# Patient Record
Sex: Female | Born: 1955 | Race: Black or African American | Hispanic: No | Marital: Married | Smoking: Former smoker
Health system: Southern US, Community
[De-identification: ages and names within clinical notes are randomized; demographics above are authoritative.]

## PROBLEM LIST (undated history)

## (undated) DIAGNOSIS — R0902 Hypoxemia: Secondary | ICD-10-CM

## (undated) DIAGNOSIS — T8859XA Other complications of anesthesia, initial encounter: Secondary | ICD-10-CM

## (undated) DIAGNOSIS — R0600 Dyspnea, unspecified: Secondary | ICD-10-CM

## (undated) DIAGNOSIS — K219 Gastro-esophageal reflux disease without esophagitis: Secondary | ICD-10-CM

## (undated) DIAGNOSIS — G4700 Insomnia, unspecified: Secondary | ICD-10-CM

## (undated) DIAGNOSIS — R519 Headache, unspecified: Secondary | ICD-10-CM

## (undated) DIAGNOSIS — R51 Headache: Secondary | ICD-10-CM

## (undated) DIAGNOSIS — E785 Hyperlipidemia, unspecified: Secondary | ICD-10-CM

## (undated) DIAGNOSIS — M1990 Unspecified osteoarthritis, unspecified site: Secondary | ICD-10-CM

## (undated) DIAGNOSIS — M549 Dorsalgia, unspecified: Secondary | ICD-10-CM

## (undated) DIAGNOSIS — G8929 Other chronic pain: Secondary | ICD-10-CM

## (undated) DIAGNOSIS — T4145XA Adverse effect of unspecified anesthetic, initial encounter: Secondary | ICD-10-CM

## (undated) DIAGNOSIS — R000 Tachycardia, unspecified: Secondary | ICD-10-CM

## (undated) DIAGNOSIS — Z972 Presence of dental prosthetic device (complete) (partial): Secondary | ICD-10-CM

## (undated) DIAGNOSIS — D649 Anemia, unspecified: Secondary | ICD-10-CM

## (undated) DIAGNOSIS — T7840XA Allergy, unspecified, initial encounter: Secondary | ICD-10-CM

## (undated) DIAGNOSIS — M810 Age-related osteoporosis without current pathological fracture: Secondary | ICD-10-CM

## (undated) DIAGNOSIS — J449 Chronic obstructive pulmonary disease, unspecified: Secondary | ICD-10-CM

## (undated) DIAGNOSIS — I10 Essential (primary) hypertension: Secondary | ICD-10-CM

## (undated) DIAGNOSIS — R42 Dizziness and giddiness: Secondary | ICD-10-CM

## (undated) HISTORY — DX: Allergy, unspecified, initial encounter: T78.40XA

## (undated) HISTORY — PX: CYSTECTOMY: SUR359

## (undated) HISTORY — PX: CYST REMOVAL TRUNK: SHX6283

## (undated) HISTORY — DX: Hypoxemia: R09.02

## (undated) HISTORY — DX: Headache, unspecified: R51.9

## (undated) HISTORY — DX: Headache: R51

## (undated) HISTORY — DX: Hyperlipidemia, unspecified: E78.5

## (undated) HISTORY — DX: Insomnia, unspecified: G47.00

## (undated) HISTORY — PX: TUBAL LIGATION: SHX77

## (undated) HISTORY — PX: ABDOMINAL HYSTERECTOMY: SHX81

## (undated) HISTORY — PX: OTHER SURGICAL HISTORY: SHX169

---

## 2004-09-30 ENCOUNTER — Emergency Department: Payer: Self-pay | Admitting: Emergency Medicine

## 2005-02-21 ENCOUNTER — Emergency Department: Payer: Self-pay | Admitting: Emergency Medicine

## 2005-06-23 ENCOUNTER — Emergency Department: Payer: Self-pay | Admitting: Emergency Medicine

## 2005-08-14 ENCOUNTER — Emergency Department: Payer: Self-pay | Admitting: Emergency Medicine

## 2007-04-25 ENCOUNTER — Emergency Department: Payer: Self-pay | Admitting: Emergency Medicine

## 2007-04-25 ENCOUNTER — Other Ambulatory Visit: Payer: Self-pay

## 2007-10-06 ENCOUNTER — Inpatient Hospital Stay: Payer: Self-pay | Admitting: *Deleted

## 2007-10-06 ENCOUNTER — Other Ambulatory Visit: Payer: Self-pay

## 2007-12-26 ENCOUNTER — Emergency Department: Payer: Self-pay | Admitting: Emergency Medicine

## 2008-03-03 ENCOUNTER — Inpatient Hospital Stay: Payer: Self-pay | Admitting: Internal Medicine

## 2008-03-29 ENCOUNTER — Emergency Department: Payer: Self-pay | Admitting: Emergency Medicine

## 2008-04-19 ENCOUNTER — Emergency Department: Payer: Self-pay | Admitting: Emergency Medicine

## 2008-07-08 ENCOUNTER — Ambulatory Visit: Payer: Self-pay | Admitting: Family Medicine

## 2008-09-04 ENCOUNTER — Emergency Department: Payer: Self-pay

## 2008-11-19 ENCOUNTER — Ambulatory Visit: Payer: Self-pay | Admitting: Internal Medicine

## 2008-11-22 ENCOUNTER — Ambulatory Visit: Payer: Self-pay | Admitting: Gastroenterology

## 2008-11-26 ENCOUNTER — Ambulatory Visit: Payer: Self-pay | Admitting: Internal Medicine

## 2008-12-20 ENCOUNTER — Ambulatory Visit: Payer: Self-pay | Admitting: Internal Medicine

## 2009-01-09 ENCOUNTER — Emergency Department: Payer: Self-pay

## 2009-01-19 ENCOUNTER — Ambulatory Visit: Payer: Self-pay | Admitting: Gastroenterology

## 2009-03-22 ENCOUNTER — Ambulatory Visit: Payer: Self-pay | Admitting: Internal Medicine

## 2009-04-06 ENCOUNTER — Ambulatory Visit: Payer: Self-pay | Admitting: Internal Medicine

## 2009-04-13 ENCOUNTER — Inpatient Hospital Stay: Payer: Self-pay | Admitting: Internal Medicine

## 2009-04-18 ENCOUNTER — Emergency Department: Payer: Self-pay | Admitting: Emergency Medicine

## 2009-04-19 ENCOUNTER — Ambulatory Visit: Payer: Self-pay | Admitting: Internal Medicine

## 2009-05-31 ENCOUNTER — Emergency Department: Payer: Self-pay | Admitting: Emergency Medicine

## 2009-07-27 ENCOUNTER — Ambulatory Visit: Payer: Self-pay | Admitting: Family Medicine

## 2009-08-04 ENCOUNTER — Encounter: Payer: Self-pay | Admitting: Family Medicine

## 2009-08-19 ENCOUNTER — Encounter: Payer: Self-pay | Admitting: Family Medicine

## 2009-08-29 ENCOUNTER — Ambulatory Visit: Payer: Self-pay | Admitting: Sports Medicine

## 2009-09-19 ENCOUNTER — Encounter: Payer: Self-pay | Admitting: Family Medicine

## 2010-04-19 ENCOUNTER — Inpatient Hospital Stay: Payer: Self-pay | Admitting: Internal Medicine

## 2010-05-11 ENCOUNTER — Inpatient Hospital Stay: Payer: Self-pay | Admitting: Internal Medicine

## 2010-10-06 ENCOUNTER — Ambulatory Visit: Payer: Self-pay | Admitting: Gastroenterology

## 2011-03-16 ENCOUNTER — Inpatient Hospital Stay: Payer: Self-pay | Admitting: Specialist

## 2011-03-16 LAB — COMPREHENSIVE METABOLIC PANEL
Albumin: 3.8 g/dL (ref 3.4–5.0)
Alkaline Phosphatase: 73 U/L (ref 50–136)
BUN: 15 mg/dL (ref 7–18)
Bilirubin,Total: 0.4 mg/dL (ref 0.2–1.0)
Co2: 27 mmol/L (ref 21–32)
Creatinine: 0.85 mg/dL (ref 0.60–1.30)
Glucose: 122 mg/dL — ABNORMAL HIGH (ref 65–99)
Osmolality: 280 (ref 275–301)
Sodium: 139 mmol/L (ref 136–145)

## 2011-03-16 LAB — CBC WITH DIFFERENTIAL/PLATELET
Basophil #: 0 10*3/uL (ref 0.0–0.1)
Basophil %: 0 %
Eosinophil %: 0.2 %
Lymphocyte #: 0.8 10*3/uL — ABNORMAL LOW (ref 1.0–3.6)
MCH: 28 pg (ref 26.0–34.0)
MCHC: 33 g/dL (ref 32.0–36.0)
Monocyte #: 0.5 10*3/uL (ref 0.0–0.7)
Platelet: 312 10*3/uL (ref 150–440)
RDW: 16.3 % — ABNORMAL HIGH (ref 11.5–14.5)

## 2011-03-16 LAB — CK TOTAL AND CKMB (NOT AT ARMC)
CK, Total: 67 U/L (ref 21–215)
CK, Total: 66 U/L (ref 21–215)
CK-MB: <0.5 ng/mL — ABNORMAL LOW (ref 0.5–3.6)

## 2011-03-16 LAB — TROPONIN I: Troponin-I: <0.02 ng/mL

## 2011-03-21 LAB — CULTURE, BLOOD (SINGLE)

## 2011-04-07 ENCOUNTER — Inpatient Hospital Stay: Payer: Self-pay | Admitting: Student

## 2011-04-07 LAB — CBC
HCT: 38 % (ref 35.0–47.0)
HGB: 12.3 g/dL (ref 12.0–16.0)
MCV: 86 fL (ref 80–100)
RBC: 4.43 10*6/uL (ref 3.80–5.20)
RDW: 16.5 % — ABNORMAL HIGH (ref 11.5–14.5)
WBC: 12.5 10*3/uL — ABNORMAL HIGH (ref 3.6–11.0)

## 2011-04-07 LAB — BASIC METABOLIC PANEL
EGFR (African American): >60
EGFR (Non-African Amer.): >60
Glucose: 99 mg/dL (ref 65–99)
Osmolality: 285 (ref 275–301)
Potassium: 3.8 mmol/L (ref 3.5–5.1)
Sodium: 142 mmol/L (ref 136–145)

## 2011-04-07 LAB — PRO B NATRIURETIC PEPTIDE: B-Type Natriuretic Peptide: 72 pg/mL (ref 0–125)

## 2011-04-08 LAB — CBC WITH DIFFERENTIAL/PLATELET
Basophil %: 0.1 %
Eosinophil #: 0 10*3/uL (ref 0.0–0.7)
Eosinophil %: 0 %
HCT: 38.3 % (ref 35.0–47.0)
HGB: 12.4 g/dL (ref 12.0–16.0)
Lymphocyte #: 0.7 10*3/uL — ABNORMAL LOW (ref 1.0–3.6)
MCH: 27.6 pg (ref 26.0–34.0)
MCV: 86 fL (ref 80–100)
Monocyte #: 0.4 10*3/uL (ref 0.0–0.7)
Neutrophil #: 19.5 10*3/uL — ABNORMAL HIGH (ref 1.4–6.5)
RDW: 16.8 % — ABNORMAL HIGH (ref 11.5–14.5)
WBC: 20.6 10*3/uL — ABNORMAL HIGH (ref 3.6–11.0)

## 2011-04-08 LAB — BASIC METABOLIC PANEL
Calcium, Total: 9.5 mg/dL (ref 8.5–10.1)
Co2: 23 mmol/L (ref 21–32)
Creatinine: 1.05 mg/dL (ref 0.60–1.30)
EGFR (Non-African Amer.): 58 — ABNORMAL LOW
Glucose: 150 mg/dL — ABNORMAL HIGH (ref 65–99)

## 2011-04-09 LAB — CBC WITH DIFFERENTIAL/PLATELET
Basophil #: 0.3 10*3/uL — ABNORMAL HIGH (ref 0.0–0.1)
Eosinophil #: 0 10*3/uL (ref 0.0–0.7)
Eosinophil %: 0 %
HCT: 36.2 % (ref 35.0–47.0)
Lymphocyte #: 1 10*3/uL (ref 1.0–3.6)
Lymphocyte %: 4.7 %
Lymphocytes: 2 %
MCHC: 32.7 g/dL (ref 32.0–36.0)
MCV: 86 fL (ref 80–100)
Monocyte #: 0.7 10*3/uL (ref 0.0–0.7)
Monocyte %: 3.1 %
Monocytes: 5 %
Neutrophil %: 90.8 %
Platelet: 338 10*3/uL (ref 150–440)
RDW: 16.3 % — ABNORMAL HIGH (ref 11.5–14.5)
Segmented Neutrophils: 93 %
WBC: 20.9 10*3/uL — ABNORMAL HIGH (ref 3.6–11.0)

## 2011-04-10 LAB — CBC WITH DIFFERENTIAL/PLATELET
Basophil #: 0 10*3/uL (ref 0.0–0.1)
Eosinophil %: 0 %
HCT: 35.5 % (ref 35.0–47.0)
Lymphocyte #: 0.9 10*3/uL — ABNORMAL LOW (ref 1.0–3.6)
MCH: 28 pg (ref 26.0–34.0)
Monocyte #: 0.7 10*3/uL (ref 0.0–0.7)
Neutrophil %: 88.7 %
Platelet: 327 10*3/uL (ref 150–440)
RBC: 4.1 10*6/uL (ref 3.80–5.20)
RDW: 16.8 % — ABNORMAL HIGH (ref 11.5–14.5)
WBC: 14.1 10*3/uL — ABNORMAL HIGH (ref 3.6–11.0)

## 2011-04-12 LAB — EXPECTORATED SPUTUM ASSESSMENT W GRAM STAIN, RFLX TO RESP C

## 2011-05-29 ENCOUNTER — Ambulatory Visit: Payer: Self-pay | Admitting: Family Medicine

## 2011-07-27 ENCOUNTER — Emergency Department: Payer: Self-pay | Admitting: Unknown Physician Specialty

## 2011-08-15 ENCOUNTER — Inpatient Hospital Stay: Payer: Self-pay | Admitting: Internal Medicine

## 2011-08-15 LAB — CBC
HCT: 39.2 % (ref 35.0–47.0)
MCH: 24.9 pg — ABNORMAL LOW (ref 26.0–34.0)
MCV: 81 fL (ref 80–100)
Platelet: 329 10*3/uL (ref 150–440)
RBC: 4.84 10*6/uL (ref 3.80–5.20)
RDW: 15.7 % — ABNORMAL HIGH (ref 11.5–14.5)
WBC: 8.6 10*3/uL (ref 3.6–11.0)

## 2011-08-15 LAB — URINALYSIS, COMPLETE
RBC,UR: <1 /HPF (ref 0–5)
Squamous Epithelial: 8
WBC UR: 1 /HPF (ref 0–5)

## 2011-08-15 LAB — BASIC METABOLIC PANEL
Anion Gap: 7 (ref 7–16)
BUN: 14 mg/dL (ref 7–18)
Chloride: 102 mmol/L (ref 98–107)
Co2: 27 mmol/L (ref 21–32)
EGFR (African American): >60
EGFR (Non-African Amer.): >60
Glucose: 134 mg/dL — ABNORMAL HIGH (ref 65–99)
Osmolality: 274 (ref 275–301)

## 2011-08-15 LAB — TROPONIN I: Troponin-I: <0.02 ng/mL

## 2011-08-15 LAB — CK TOTAL AND CKMB (NOT AT ARMC)
CK, Total: 78 U/L (ref 21–215)
CK-MB: <0.5 ng/mL — ABNORMAL LOW (ref 0.5–3.6)

## 2011-08-16 LAB — CBC WITH DIFFERENTIAL/PLATELET
Basophil #: 0 10*3/uL (ref 0.0–0.1)
Eosinophil #: 0 10*3/uL (ref 0.0–0.7)
HCT: 37.1 % (ref 35.0–47.0)
HGB: 11.6 g/dL — ABNORMAL LOW (ref 12.0–16.0)
Lymphocyte #: 0.6 10*3/uL — ABNORMAL LOW (ref 1.0–3.6)
MCH: 25 pg — ABNORMAL LOW (ref 26.0–34.0)
MCHC: 31.2 g/dL — ABNORMAL LOW (ref 32.0–36.0)
Monocyte #: 0.6 x10 3/mm (ref 0.2–0.9)
Neutrophil %: 94.2 %
Platelet: 385 10*3/uL (ref 150–440)
RBC: 4.62 10*6/uL (ref 3.80–5.20)
RDW: 16 % — ABNORMAL HIGH (ref 11.5–14.5)

## 2011-08-16 LAB — BASIC METABOLIC PANEL
Anion Gap: 7 (ref 7–16)
BUN: 22 mg/dL — ABNORMAL HIGH (ref 7–18)
Calcium, Total: 9.6 mg/dL (ref 8.5–10.1)
Co2: 28 mmol/L (ref 21–32)
Creatinine: 1 mg/dL (ref 0.60–1.30)
EGFR (Non-African Amer.): >60
Sodium: 136 mmol/L (ref 136–145)

## 2011-08-20 LAB — CBC WITH DIFFERENTIAL/PLATELET
Basophil #: 0 10*3/uL (ref 0.0–0.1)
Basophil %: 0.1 %
Eosinophil %: 0.1 %
HCT: 37.1 % (ref 35.0–47.0)
HGB: 11.3 g/dL — ABNORMAL LOW (ref 12.0–16.0)
Lymphocyte #: 1.3 10*3/uL (ref 1.0–3.6)
Lymphocyte %: 8.3 %
MCV: 81 fL (ref 80–100)
Monocyte %: 6.3 %
Neutrophil #: 13 10*3/uL — ABNORMAL HIGH (ref 1.4–6.5)
Platelet: 381 10*3/uL (ref 150–440)
RBC: 4.61 10*6/uL (ref 3.80–5.20)
RDW: 15.9 % — ABNORMAL HIGH (ref 11.5–14.5)
WBC: 15.2 10*3/uL — ABNORMAL HIGH (ref 3.6–11.0)

## 2011-08-20 LAB — CULTURE, BLOOD (SINGLE)

## 2011-08-20 LAB — BASIC METABOLIC PANEL
BUN: 26 mg/dL — ABNORMAL HIGH (ref 7–18)
Calcium, Total: 8.7 mg/dL (ref 8.5–10.1)
Creatinine: 0.92 mg/dL (ref 0.60–1.30)
EGFR (African American): >60
EGFR (Non-African Amer.): >60
Glucose: 167 mg/dL — ABNORMAL HIGH (ref 65–99)
Osmolality: 284 (ref 275–301)
Potassium: 4.1 mmol/L (ref 3.5–5.1)

## 2011-09-20 ENCOUNTER — Ambulatory Visit: Payer: Self-pay | Admitting: Unknown Physician Specialty

## 2011-10-03 ENCOUNTER — Encounter: Payer: Self-pay | Admitting: Unknown Physician Specialty

## 2011-10-21 ENCOUNTER — Encounter: Payer: Self-pay | Admitting: Unknown Physician Specialty

## 2011-11-13 ENCOUNTER — Emergency Department: Payer: Self-pay | Admitting: Emergency Medicine

## 2012-02-15 ENCOUNTER — Emergency Department: Payer: Self-pay | Admitting: Emergency Medicine

## 2012-02-15 LAB — CBC
HGB: 12.8 g/dL (ref 12.0–16.0)
MCH: 28 pg (ref 26.0–34.0)
Platelet: 296 10*3/uL (ref 150–440)
RDW: 19.5 % — ABNORMAL HIGH (ref 11.5–14.5)
WBC: 6.1 10*3/uL (ref 3.6–11.0)

## 2012-02-15 LAB — BASIC METABOLIC PANEL
Anion Gap: 8 (ref 7–16)
BUN: 15 mg/dL (ref 7–18)
Calcium, Total: 8.6 mg/dL (ref 8.5–10.1)
Chloride: 102 mmol/L (ref 98–107)
Co2: 29 mmol/L (ref 21–32)
EGFR (African American): >60
Sodium: 139 mmol/L (ref 136–145)

## 2012-02-15 LAB — TROPONIN I: Troponin-I: <0.02 ng/mL

## 2012-02-15 LAB — CK TOTAL AND CKMB (NOT AT ARMC)
CK, Total: 203 U/L (ref 21–215)
CK-MB: 1.1 ng/mL (ref 0.5–3.6)

## 2012-06-27 ENCOUNTER — Ambulatory Visit: Payer: Self-pay | Admitting: Family Medicine

## 2012-07-01 ENCOUNTER — Ambulatory Visit: Payer: Self-pay | Admitting: Family Medicine

## 2012-10-03 ENCOUNTER — Emergency Department: Payer: Self-pay | Admitting: Emergency Medicine

## 2012-10-03 LAB — URINALYSIS, COMPLETE
Bacteria: NONE SEEN
Bilirubin,UR: NEGATIVE
Blood: NEGATIVE
Ketone: NEGATIVE
Nitrite: NEGATIVE
Ph: 6 (ref 4.5–8.0)
Protein: NEGATIVE
Specific Gravity: 1.013 (ref 1.003–1.030)
Squamous Epithelial: 5
WBC UR: NONE SEEN /HPF (ref 0–5)

## 2012-10-03 LAB — TROPONIN I: Troponin-I: <0.02 ng/mL

## 2012-10-03 LAB — COMPREHENSIVE METABOLIC PANEL
Albumin: 3.9 g/dL (ref 3.4–5.0)
Alkaline Phosphatase: 83 U/L (ref 50–136)
Bilirubin,Total: 0.3 mg/dL (ref 0.2–1.0)
Chloride: 103 mmol/L (ref 98–107)
Creatinine: 0.98 mg/dL (ref 0.60–1.30)
EGFR (African American): >60
EGFR (Non-African Amer.): >60
Sodium: 135 mmol/L — ABNORMAL LOW (ref 136–145)
Total Protein: 8.1 g/dL (ref 6.4–8.2)

## 2012-10-03 LAB — CBC
HCT: 45.3 % (ref 35.0–47.0)
HGB: 15.5 g/dL (ref 12.0–16.0)
MCH: 31.3 pg (ref 26.0–34.0)
MCHC: 34.2 g/dL (ref 32.0–36.0)
Platelet: 309 10*3/uL (ref 150–440)
RDW: 13.7 % (ref 11.5–14.5)
WBC: 5.7 10*3/uL (ref 3.6–11.0)

## 2012-10-03 LAB — SEDIMENTATION RATE: Erythrocyte Sed Rate: 2 mm/hr (ref 0–30)

## 2012-10-05 ENCOUNTER — Emergency Department: Payer: Self-pay | Admitting: Internal Medicine

## 2012-10-05 LAB — URINALYSIS, COMPLETE
Bacteria: NONE SEEN
Bilirubin,UR: NEGATIVE
Blood: NEGATIVE
Leukocyte Esterase: NEGATIVE
Nitrite: NEGATIVE
Ph: 5 (ref 4.5–8.0)
RBC,UR: 1 /HPF (ref 0–5)

## 2012-10-05 LAB — TSH: Thyroid Stimulating Horm: 0.52 u[IU]/mL

## 2012-10-05 LAB — CBC
HGB: 15.1 g/dL (ref 12.0–16.0)
MCV: 91 fL (ref 80–100)
Platelet: 279 10*3/uL (ref 150–440)
RBC: 4.93 10*6/uL (ref 3.80–5.20)
WBC: 6.6 10*3/uL (ref 3.6–11.0)

## 2012-10-05 LAB — COMPREHENSIVE METABOLIC PANEL
Albumin: 3.7 g/dL (ref 3.4–5.0)
Alkaline Phosphatase: 79 U/L (ref 50–136)
Anion Gap: 3 — ABNORMAL LOW (ref 7–16)
BUN: 19 mg/dL — ABNORMAL HIGH (ref 7–18)
Calcium, Total: 9 mg/dL (ref 8.5–10.1)
Chloride: 102 mmol/L (ref 98–107)
Co2: 30 mmol/L (ref 21–32)
EGFR (African American): >60
EGFR (Non-African Amer.): >60
Osmolality: 275 (ref 275–301)
SGPT (ALT): 21 U/L (ref 12–78)
Sodium: 135 mmol/L — ABNORMAL LOW (ref 136–145)

## 2012-10-05 LAB — SEDIMENTATION RATE: Erythrocyte Sed Rate: 6 mm/hr (ref 0–30)

## 2012-11-27 ENCOUNTER — Ambulatory Visit
Admit: 2012-11-27 | Disposition: A | Discharge status: Home / Self Care | Payer: Self-pay | Admitting: Obstetrics and Gynecology

## 2013-01-05 ENCOUNTER — Emergency Department: Payer: Self-pay | Admitting: Emergency Medicine

## 2013-02-05 ENCOUNTER — Inpatient Hospital Stay: Payer: Self-pay | Admitting: Internal Medicine

## 2013-02-05 LAB — CK TOTAL AND CKMB (NOT AT ARMC): CK-MB: 0.8 ng/mL (ref 0.5–3.6)

## 2013-02-05 LAB — COMPREHENSIVE METABOLIC PANEL
Albumin: 3.9 g/dL (ref 3.4–5.0)
Anion Gap: 5 — ABNORMAL LOW (ref 7–16)
BUN: 16 mg/dL (ref 7–18)
Calcium, Total: 9.5 mg/dL (ref 8.5–10.1)
Co2: 29 mmol/L (ref 21–32)
Creatinine: 0.79 mg/dL (ref 0.60–1.30)
Glucose: 119 mg/dL — ABNORMAL HIGH (ref 65–99)
Potassium: 3.2 mmol/L — ABNORMAL LOW (ref 3.5–5.1)
SGOT(AST): 20 U/L (ref 15–37)
SGPT (ALT): 19 U/L (ref 12–78)
Sodium: 135 mmol/L — ABNORMAL LOW (ref 136–145)
Total Protein: 8.2 g/dL (ref 6.4–8.2)

## 2013-02-05 LAB — URINALYSIS, COMPLETE
Bacteria: NONE SEEN
Bilirubin,UR: NEGATIVE
Glucose,UR: NEGATIVE mg/dL (ref 0–75)
Leukocyte Esterase: NEGATIVE
Ph: 5 (ref 4.5–8.0)
Protein: NEGATIVE
RBC,UR: 1 /HPF (ref 0–5)
Squamous Epithelial: <1

## 2013-02-05 LAB — CBC
HCT: 45.3 % (ref 35.0–47.0)
MCHC: 34.2 g/dL (ref 32.0–36.0)
Platelet: 265 10*3/uL (ref 150–440)
RDW: 14.5 % (ref 11.5–14.5)
WBC: 11.1 10*3/uL — ABNORMAL HIGH (ref 3.6–11.0)

## 2013-02-05 LAB — TROPONIN I: Troponin-I: <0.02 ng/mL

## 2013-02-05 LAB — PRO B NATRIURETIC PEPTIDE: B-Type Natriuretic Peptide: 53 pg/mL (ref 0–125)

## 2013-02-05 LAB — MAGNESIUM: Magnesium: 1.8 mg/dL

## 2013-02-06 LAB — CBC WITH DIFFERENTIAL/PLATELET
Basophil #: 0 10*3/uL (ref 0.0–0.1)
Eosinophil #: 0 10*3/uL (ref 0.0–0.7)
Eosinophil %: 0 %
HCT: 38.1 % (ref 35.0–47.0)
HGB: 12.3 g/dL (ref 12.0–16.0)
Lymphocyte #: 0.6 10*3/uL — ABNORMAL LOW (ref 1.0–3.6)
Lymphocyte %: 3.8 %
MCH: 29.6 pg (ref 26.0–34.0)
MCHC: 32.3 g/dL (ref 32.0–36.0)
MCV: 92 fL (ref 80–100)
Monocyte #: 0.8 x10 3/mm (ref 0.2–0.9)
Monocyte %: 4.6 %
Neutrophil #: 15.4 10*3/uL — ABNORMAL HIGH (ref 1.4–6.5)
Neutrophil %: 91.5 %
Platelet: 241 10*3/uL (ref 150–440)
RBC: 4.16 10*6/uL (ref 3.80–5.20)
RDW: 14.4 % (ref 11.5–14.5)
WBC: 16.9 10*3/uL — ABNORMAL HIGH (ref 3.6–11.0)

## 2013-02-06 LAB — BASIC METABOLIC PANEL
Anion Gap: 5 — ABNORMAL LOW (ref 7–16)
BUN: 18 mg/dL (ref 7–18)
Calcium, Total: 8.9 mg/dL (ref 8.5–10.1)
Chloride: 107 mmol/L (ref 98–107)
Co2: 26 mmol/L (ref 21–32)
EGFR (Non-African Amer.): >60
Glucose: 181 mg/dL — ABNORMAL HIGH (ref 65–99)
Osmolality: 282 (ref 275–301)
Potassium: 4.2 mmol/L (ref 3.5–5.1)
Sodium: 138 mmol/L (ref 136–145)

## 2013-02-07 LAB — MAGNESIUM: Magnesium: 1.9 mg/dL

## 2013-03-05 ENCOUNTER — Ambulatory Visit: Payer: Self-pay

## 2013-07-23 ENCOUNTER — Ambulatory Visit: Payer: Self-pay | Admitting: Family Medicine

## 2013-10-12 ENCOUNTER — Inpatient Hospital Stay: Payer: Self-pay | Admitting: Internal Medicine

## 2013-10-12 LAB — BASIC METABOLIC PANEL
Anion Gap: 9 (ref 7–16)
BUN: 21 mg/dL — ABNORMAL HIGH (ref 7–18)
CO2: 30 mmol/L (ref 21–32)
Calcium, Total: 9.6 mg/dL (ref 8.5–10.1)
Chloride: 106 mmol/L (ref 98–107)
Creatinine: 1.03 mg/dL (ref 0.60–1.30)
EGFR (African American): >60
EGFR (Non-African Amer.): >60
Glucose: 105 mg/dL — ABNORMAL HIGH (ref 65–99)
Osmolality: 292 (ref 275–301)
POTASSIUM: 4.3 mmol/L (ref 3.5–5.1)
Sodium: 145 mmol/L (ref 136–145)

## 2013-10-12 LAB — CBC
HCT: 49 % — ABNORMAL HIGH (ref 35.0–47.0)
HGB: 15.5 g/dL (ref 12.0–16.0)
MCH: 29.8 pg (ref 26.0–34.0)
MCHC: 31.6 g/dL — ABNORMAL LOW (ref 32.0–36.0)
MCV: 94 fL (ref 80–100)
PLATELETS: 335 10*3/uL (ref 150–440)
RBC: 5.19 10*6/uL (ref 3.80–5.20)
RDW: 13.8 % (ref 11.5–14.5)
WBC: 12.5 10*3/uL — ABNORMAL HIGH (ref 3.6–11.0)

## 2013-10-12 LAB — TROPONIN I

## 2013-10-13 LAB — BASIC METABOLIC PANEL
Anion Gap: 11 (ref 7–16)
BUN: 21 mg/dL — ABNORMAL HIGH (ref 7–18)
CALCIUM: 9.5 mg/dL (ref 8.5–10.1)
CHLORIDE: 104 mmol/L (ref 98–107)
Co2: 27 mmol/L (ref 21–32)
Creatinine: 1.26 mg/dL (ref 0.60–1.30)
EGFR (Non-African Amer.): 47 — ABNORMAL LOW
GFR CALC AF AMER: 55 — AB
Glucose: 174 mg/dL — ABNORMAL HIGH (ref 65–99)
Osmolality: 290 (ref 275–301)
Potassium: 4.2 mmol/L (ref 3.5–5.1)
SODIUM: 142 mmol/L (ref 136–145)

## 2013-10-13 LAB — CBC WITH DIFFERENTIAL/PLATELET
BASOS PCT: 0.3 %
Basophil #: 0.1 10*3/uL (ref 0.0–0.1)
EOS ABS: 0 10*3/uL (ref 0.0–0.7)
Eosinophil %: 0 %
HCT: 46.4 % (ref 35.0–47.0)
HGB: 14.8 g/dL (ref 12.0–16.0)
LYMPHS ABS: 0.8 10*3/uL — AB (ref 1.0–3.6)
LYMPHS PCT: 3.7 %
MCH: 30.3 pg (ref 26.0–34.0)
MCHC: 31.8 g/dL — AB (ref 32.0–36.0)
MCV: 95 fL (ref 80–100)
MONO ABS: 0.5 x10 3/mm (ref 0.2–0.9)
MONOS PCT: 2.3 %
Neutrophil #: 19.5 10*3/uL — ABNORMAL HIGH (ref 1.4–6.5)
Neutrophil %: 93.7 %
PLATELETS: 314 10*3/uL (ref 150–440)
RBC: 4.87 10*6/uL (ref 3.80–5.20)
RDW: 13.9 % (ref 11.5–14.5)
WBC: 20.9 10*3/uL — ABNORMAL HIGH (ref 3.6–11.0)

## 2013-10-14 LAB — CBC WITH DIFFERENTIAL/PLATELET
BASOS PCT: 0.2 %
Basophil #: 0 10*3/uL (ref 0.0–0.1)
EOS ABS: 0 10*3/uL (ref 0.0–0.7)
EOS PCT: 0 %
HCT: 45.2 % (ref 35.0–47.0)
HGB: 14.2 g/dL (ref 12.0–16.0)
Lymphocyte #: 0.7 10*3/uL — ABNORMAL LOW (ref 1.0–3.6)
Lymphocyte %: 3.2 %
MCH: 29.9 pg (ref 26.0–34.0)
MCHC: 31.5 g/dL — AB (ref 32.0–36.0)
MCV: 95 fL (ref 80–100)
Monocyte #: 0.9 x10 3/mm (ref 0.2–0.9)
Monocyte %: 3.7 %
NEUTROS PCT: 92.9 %
Neutrophil #: 21.7 10*3/uL — ABNORMAL HIGH (ref 1.4–6.5)
Platelet: 314 10*3/uL (ref 150–440)
RBC: 4.76 10*6/uL (ref 3.80–5.20)
RDW: 14.3 % (ref 11.5–14.5)
WBC: 23.3 10*3/uL — AB (ref 3.6–11.0)

## 2013-10-16 LAB — CREATININE, SERUM
Creatinine: 0.97 mg/dL (ref 0.60–1.30)
EGFR (African American): >60

## 2013-10-20 ENCOUNTER — Ambulatory Visit: Payer: Self-pay | Admitting: Family Medicine

## 2014-04-15 ENCOUNTER — Emergency Department: Payer: Self-pay | Admitting: Emergency Medicine

## 2014-06-11 NOTE — Discharge Summary (Signed)
PATIENT NAME:  Andrea Bell, Andrea Bell MR#:  [id number] DATE OF BIRTH:  [birth year]  DATE OF ADMISSION:  02/05/2013 DATE OF DISCHARGE:  02/10/2013  PRIMARY CARE PHYSICIAN:  Marguerita Merles, MD  DISCHARGE DIAGNOSES: 1.  Acute on chronic hypoxia, respiratory failure.  2.  Chronic obstructive pulmonary disease exacerbation.  3.  Gastroesophageal reflux disease. 4.  Hypertension.  5.  Monoclonal gammopathy of unknown significance.   CONDITION: Stable.   CODE STATUS: Full code.   HOME MEDICATIONS: Please refer to the [facility] physician discharge instruction medication reconciliation list.   DIET: Low-sodium, low-fat, low-cholesterol diet.   ACTIVITY: As tolerated.   FOLLOWUP CARE: Follow up PCP within 1 to 2 weeks. The patient does not need home oxygen.   REASON FOR ADMISSION: Shortness of breath.   HOSPITAL COURSE: The patient is a 59 year old African American female with a history of COPD, hypertension, GERD presented to the ED with shortness of breath 2 days. The patient used around-the-clock nebulizer every 2 hours without any improvement so the patient came to the ED for further evaluation. The patient was noted to have respiratory distress with tachypnea with respiratory rate at 28 per minutes. The patient's heart rate was 127. In addition, the patient used accessory muscles for respiration. The patient was placed on BiPAP and admitted for respiratory failure and COPD exacerbation. For detailed history and physical examination, please refer to the admission note dictated by Dr. Manuella Ghazi. Laboratory data on admission date showed normal CBC except white count 11.1. Negative cardiac enzymes, normal liver function tests. Normal BMP except sodium 135. Urinalysis is negative. Chest x-ray showed no acute cardiopulmonary disease.   Acute on chronic hypoxic respiratory failure. The patient's initially was treated with BiPAP. We started Solu-Medrol nebulizer and Levaquin, continued the patient's home medications,  Flovent and Spiriva. The patient's symptoms have been improving so the patient was off BiPAP but has been put on oxygen by nasal cannula, 2 liters. The patient continues to have wheezing and a cough on physical examination, so moderate expiratory wheezing and crackles so I added Symbicort.  The patient's symptoms have much improved since yesterday, but the patient still has mild wheezing.  We tried to wean off the oxygen. The patient's O2 saturation is 94%  without oxygen today. The patient's vital signs are stable. She is clinically stable and will be discharged to home today. I discussed the patient's discharge plan with the patient, case manager and nurse.   TIME SPENT: About 38 minutes.   ____________________________ Demetrios Loll, MD qc:cs D: 02/10/2013 15:10:00 ET T: 02/10/2013 15:32:06 ET JOB#: [id number]  cc: Demetrios Loll, MD, <Dictator> Demetrios Loll MD ELECTRONICALLY SIGNED 02/10/2013 17:07

## 2014-06-11 NOTE — H&P (Signed)
PATIENT NAME:  Andrea Bell, HATTERY MR#:  [id number] DATE OF BIRTH:  1955/03/10  DATE OF ADMISSION:  02/05/2013  PRIMARY CARE PHYSICIAN: Marguerita Merles, MD  REQUESTING PHYSICIAN: Wandra Arthurs, MD  CHIEF COMPLAINT: Shortness of breath.   HISTORY OF PRESENT ILLNESS: The patient is a 59 year old female with a known history of COPD, hypertension, GERD, who is being admitted for acute on chronic respiratory failure, likely secondary to COPD exacerbation. The patient had a similar episode about 3 weeks ago when she was in the Emergency Department and was requested to go see her family doctor as an outpatient. She was prescribed prednisone taper for 10 days along with antibiotics, nebulizer breathing treatment and some cough medicine, and she felt better after 10 to 15 days. She was doing okay for about a week. Started getting worse Wednesday morning, about yesterday morning. Kept trying around-the-clock nebulizer every 2 hours, but kept coughing along with chest tightness and was very short of breath, for which she decided to come to the Emergency Department. This morning, while in the ED, she was in severe respiratory distress with tachypnea and respiratory rate of 28 per minute. Her heart rate was 127 per minute and was using accessory muscles of respiration. Was placed on BiPAP and is being admitted for further evaluation and management.   PAST MEDICAL HISTORY:  1. COPD. 2. Hypertension.  3. GERD.  4. MGUS.   SOCIAL HISTORY: She is a former smoker. No alcohol or drug use. She does not work.   FAMILY HISTORY: Positive for hypertension and diabetes in her mother.   ALLERGIES: CODEINE.   MEDICATIONS AT HOME:  1. Acetaminophen/hydrocodone 300/5 mg 1 tablet p.o. every 6 to 8 hours as needed.  2. DuoNeb inhaled 4 times a day as needed.  3. Aspirin 81 mg p.o. daily.  4. Bromfed DM 5 mL p.o. 4 times a day.  5. Colace 100 mg p.o. daily.  6. Flovent 2 puffs inhaled twice a day.  7.  Hydrochlorothiazide/lisinopril 12.5/10 mg 1 tablet p.o. daily. 8. Ketorolac 10 mg p.o. 4 times a day as needed.  9. Metoprolol 25 mg p.o. 1/2 to 1 tablet daily.  10. Mimvey 1 mg/0.5 mg 1 tablet p.o. daily.  11. Nexium 40 mg p.o. daily.  12. Pataday 0.2% ophthalmic solution 1 drop to each eye daily.  13. MiraLax once daily.  14. Proventil 2 puffs inhaled as needed.  15. Spiriva once daily. 16. Temazepam 7.5 mg p.o. at bedtime.  17. Tizanidine 4 mg p.o. up to 3 to 4 times a day as needed for muscle spasm.  18. Triamcinolone nasal spray once daily.  19. Tylenol 500 mg 2 tablets p.o. every 6 hours as needed.   REVIEW OF SYSTEMS:  CONSTITUTIONAL: No fever. Positive fatigue and weakness.  EYES: No blurred or double vision.  ENT: No tinnitus or ear pain.  RESPIRATORY: Positive for dry cough, shortness of breath and chest tightness, also wheezing.  CARDIOVASCULAR: Some chest tightness with constant coughing. No orthopnea or edema.  GASTROINTESTINAL: No nausea, vomiting or diarrhea. GENITOURINARY: No dysuria or hematuria.  ENDOCRINE: No polyuria or nocturia. Positive for hot and cold flashes. HEMATOLOGY: Chronic anemia. No easy bruising or bleeding.  SKIN: No rash or lesion.  MUSCULOSKELETAL: Having a body ache.  NEUROLOGIC: No tingling, numbness or weakness.  PSYCHIATRIC: No history of anxiety or depression.   PHYSICAL EXAMINATION:  VITAL SIGNS: Temperature 96.3, heart rate 127 per minute, respirations 28 per minute, blood pressure 133/80 mmHg, she was  saturating 89% on room air and was placed on BiPAP due to her tachypnea.  GENERAL: The patient is a 60 year old female lying in the bed in acute respiratory distress. EYES: Pupils equal, round and reactive to light and accommodation. No scleral icterus. Extraocular muscles intact.  HEENT: Head atraumatic, normocephalic. Oropharynx and nasopharynx clear.  NECK: Supple. No jugular venous distention. No thyroid enlargement or tenderness. LUNGS:  Wheezing throughout both lungs. Using accessory muscles of respiration and some labored breathing. Also has rhonchi at bases.  CARDIOVASCULAR: S1, S2 normal, tachycardic. No murmurs, rubs or gallops.  ABDOMEN: Soft, nontender, nondistended. Bowel sounds present. No organomegaly or mass.  EXTREMITIES: No pedal edema, cyanosis or clubbing.  NEUROLOGIC: Nonfocal examination. Cranial nerves II through XII intact. Muscle strength 5 out of 5 in all extremities. Sensation intact.  PSYCHIATRIC: The patient is alert and oriented x3.  SKIN: No obvious rash, lesion or ulcer.   LABORATORY PANEL: Normal CBC except white count of 11.1. Negative first set of cardiac enzymes. Normal liver function tests. Normal BMP except sodium 135, potassium 3.2. Negative UA.   IMAGING: Chest x-ray showed no acute cardiopulmonary disease.   IMPRESSION AND PLAN:  1. Acute on chronic hypoxic respiratory failure, now requiring BiPAP while at least in the Emergency Department. This is likely due to chronic obstructive pulmonary disease exacerbation. Will start her on IV steroids, antibiotics, nebulizer breathing treatment. Continue her Flovent and Spiriva. She will be in stepdown unit due to her BiPAP. 2. Hypokalemia. Will replete and recheck. 3. Flulike symptoms. Will check flu test to make sure as this is the peak season, she has not received flu vaccination either, which will be given, if her test is negative, before discharge. 4. Constipation. Will start her on aggressive bowel regimen.  5. Gastroesophageal reflux disease. Will continue PPI.   TOTAL TIME TAKING CARE OF THIS PATIENT: 55 minutes.   ____________________________ Lucina Mellow. Manuella Ghazi, MD vss:lb D: 02/05/2013 14:21:28 ET T: 02/05/2013 15:10:55 ET JOB#: [id number]  cc: Doyt Castellana S. Manuella Ghazi, MD, <Dictator> Marguerita Merles, MD Lucina Mellow [facility] MD ELECTRONICALLY SIGNED 02/07/2013 15:18

## 2014-06-12 NOTE — Discharge Summary (Signed)
PATIENT NAME:  Andrea Bell, KAPRAL MR#:  [id number] DATE OF BIRTH:  [birth year]  DATE OF ADMISSION:  10/12/2013 DATE OF DISCHARGE:  10/16/2013  ADMITTING DIAGNOSIS: Chronic obstructive pulmonary disease exacerbation.   DISCHARGE DIAGNOSES:   1.  Acute respiratory failure due to chronic obstructive pulmonary disease exacerbation.  2.  Acute bronchitis.  3.  Anxiety.  4.  Hypertension. 5.  Gastroesophageal reflux disease. 6.  History of monoclonal gammopathy of undetermined significance. 7.  Ongoing tobacco abuse.   DISCHARGE CONDITION: Stable.   DISCHARGE MEDICATIONS: The patient is to continue: 1. Aspirin 81 mg p.o. daily. 2.  Docusate sodium 100 mg p.o. daily. 3.  MiraLax 17 grams twice daily as needed. 4.  Nexium 40 mg p.o. daily. 5.  Proventil 2 puffs daily as needed. 6.  Tamsulosin nasal spray one spray once daily. 7.  Pataday 0.2% ophthalmic solution one drop to each affected eye once daily as needed. 8. Mimvey 1 mg/0.5 mg oral tablet once daily. 9.  Ketorolac 10 mg 4 times daily as needed. 10. Temazepam 7.5 mg p.o. at bedtime. 11. Tizanidine 4 mg three times daily as needed. 12. Flovent 110 mcg inhalation 2 puffs twice daily.  13. Spiriva 1 inhalation once daily.  14. Metoprolol tartrate 25 mg p.o. twice daily.  15. Albuterol ipratropium 2.5/0.5 mg in 3 mL inhalation solution 3 mL 4 times daily as needed.  16. Humibid LA 600 mg p.o. twice daily.  17. Symbicort 160/4.5 mcg 2 puffs twice daily.  18. Prednisone 10 mg tablets.  Prednisone taper will be 50 mg p.o. once and then taper by 10 mg daily until stopped.  19. Levofloxacin 750 mg p.o. daily for four more days.  20. Habitrol transdermal patch 14 mg topically daily. 21. Nicotine oral inhaler 1 inhalation every two hours as needed.  22. Tussionex 5 mL twice daily as needed.   Home oxygen: None.   DIET: 2 gram salt, low fat, low cholesterol, regular consistency.   ACTIVITY LIMITATIONS:  As tolerated.   FOLLOWUP  APPOINTMENTS:  With primary care physician in two days after discharge.    CONSULTANTS: Care management, social work.   RADIOLOGIC STUDIES: Chest x-ray, PA and lateral, on 10/12/2013 showed no acute cardiopulmonary findings.   HISTORY AND HOSPITAL COURSE:  The patient is a 59 year old Caucasian female with a history of ongoing tobacco abuse, history of chronic obstructive pulmonary disease, who presents to the hospital with complaints of shortness of breath. Please refer to Dr. Governor Specking admission note on 10/12/2013.  On arrival to the Emergency Room, patient's temperature was 98.2, pulse 113,  respiratory rate 28, blood pressure 179/102, and oxygen saturation was 89% on room air initially.  The patient's physical exam revealed bilateral expiratory wheezes in all lung fields.  Chest x-ray was unremarkable.   The patient's lab data done in the Emergency Room showed elevated glucose level to 105, a BUN of 21,  otherwise BMP was unremarkable. The patient's troponin was less than 0.02. White blood cell count was elevated to 12.5, hemoglobin was 15.5, platelet count was 335,000. ABGs were performed on 10/16/2013:  Percent FiO2, showed pH of 7.38, pCO2 was 41, pO2 was 65, saturation was 91.7%. EKG showed sinus tachycardia at 114 beats per minute, left axis deviation, right bundle branch block, inferior infarct as well as anterior infarct, which were already cited as age indeterminate, but no acute ST-T changes were noted.   The patient was admitted to the hospital for further evaluation. Initially, she was started on Rocephin  and Zithromax as well as steroids and inhalation therapy.  She did not improve significantly.  On 10/15/2013, she was changed to Levaquin orally and her condition improved. She was able to expectorate better and her lungs sounded better.  It was felt the patient should continue antibiotic therapy for four more days to complete a five day course. She is to continue the steroid taper and  inhalation therapy with DuoNebs and steroid inhalers. She is to follow up with her primary care physician in the next few days after discharge for further recommendations.   In regards to anxiety, she is to continue her usual outpatient medications.  For hypertension, the patient was advised to resume her metoprolol.  Her blood pressure was relatively well-controlled.  In regards to ongoing tobacco abuse, she was counseled and recommended to quit. She is being discharged in stable condition with the above-mentioned medications and followup.   Of note, her hypoxia resolve, and by the day of discharge she was on room air at rest as well as on exertion, and her oxygen saturations were good, 99% at rest and 96% on exertion on room air. Her vital signs on the day of discharge, 10/16/2013, temperature was 97.4, pulse was 64, respiration rate was 18, blood pressure 156/81. Saturation as mentioned above 99% on room air at rest and 96% on exertion.   TIME SPENT: 40 minutes on this patient.    ____________________________ Theodoro Grist, MD rv:nr D: 10/16/2013 16:31:02 ET T: 10/16/2013 21:04:04 ET JOB#: [id number]  cc: Theodoro Grist, MD, <Dictator> [place] MD ELECTRONICALLY SIGNED 11/07/2013 18:44

## 2014-06-12 NOTE — H&P (Signed)
PATIENT NAME:  Andrea Bell, LAYE MR#:  [id number] DATE OF BIRTH:  September 12, 1955  DATE OF ADMISSION:  10/12/2013    CHIEF COMPLAINT: Shortness of breath.   HISTORY OF PRESENT ILLNESS: The patient is a 59 year old female patient with history of COPD, hypertension, anxiety, comes in because of shortness of breath. The patient has been having shortness of breath for about a month associated with tightness in the chest and also cough. The patient also has a feeling of sensation of fever but she really never checked the fever. The patient noted to have COPD exacerbation with  severe wheezing and hypoxia for 91% saturation on 2 liters. The patient is going to be admitted for acute respiratory failure with COPD exacerbation.   PAST MEDICAL HISTORY: Significant for hypertension, anxiety, GERD, and COPD and MGUS.   SOCIAL HISTORY: Smokes about 4-5 cigarettes a day, last one was this morning. No alcohol. No drugs.   FAMILY HISTORY: Hypertension and diabetes to the mother.   ALLERGIES: CODEINE.   PAST SURGICAL HISTORY: History of colonoscopies only but no other surgeries.  MEDICATIONS AT HOME:  1.  Albuterol ipratropium nebulizer 4 times daily for wheezing as needed.  2.  Aspirin 81 mg daily.  3.  Colace 100 mg daily.  4.  Flovent 110 mcg inhalation daily.  5.  Ketorolac 10 mg 4 times a day. 6.  Metoprolol 100 mg half tablet daily.  7.  Mimvey 1 mg/0.5 mg tablet once daily.  8.  Nexium 40 mg p.o. daily.  9.  Pataday 0.2 ophthalmic solution once a day in eyes. 10.  Temazepam 7.5 mg daily. 11.  Spiriva 18 mcg inhalation daily. 12.  MiraLax as needed for constipation.  13.  Proventil 90 mcg 2 puffs as needed for wheezing.  14.  Tizanidine 4 mg p.o. t.i.d. as needed for muscle pains.  15.  Triamcinolone nasal spray 55 mcg once a day.   REVIEW OF SYSTEMS: CONSTITUTIONAL: The patient has some fever sensation for about a month and also some fatigue.  EYES: No blurred vision. Does have allergies. EARS,  NOSE, THROAT: No tinnitus. No ear pain. No epistaxis. No difficulty swallowing.  RESPIRATORY: The patient has cough, wheezing, and she has had COPD flare for about a month.  GASTROINTESTINAL: No nausea. No vomiting. No abdominal pain.  GENITOURINARY: No dysuria.  ENDOCRINE: No polyuria or nocturia.  HEMATOLOGIC: No anemia.  INTEGUMENTARY: No skin rashes.  MUSCULOSKELETAL: No joint pains.  NEUROLOGIC: No numbness or weakness.  PSYCHIATRIC: No anxiety or insomnia.   PHYSICAL EXAMINATION:  VITAL SIGNS: Temperature 98.2, heart rate 113, blood pressure 179/102 initially and O2 saturations were 89% on room air. Right now, she is on 2 liters of oxygen and saturations are like 91%. The patient still tachycardic with heart rate 114 beats per minute.  GENERAL: The patient is alert, awake, oriented. Not using accessory muscles of respiration. Able to talk for a full sentence, but she is constantly coughing.  HEENT: PERRLA. EOM intact. There is no scleral icterus, no conjunctivitis. Hearing is normal. Tympanic membranes are clear. No oropharyngeal erythema. Mucous membranes are normal. Her dentition is good.  NECK: Supple. No JVD, no carotid bruit, no lymphadenopathy. LUNGS:  Bilateral expiratory wheeze in all lung fields.  CARDIOVASCULAR: S1, S2 regular, tachycardic. PMI not displaced. Peripheral pulses are intact. No pedal edema.  ABDOMEN: Soft, nontender, nondistended. Bowel sounds present. No organomegaly.  MUSCULOSKELETAL: Strength 5/5 in upper and lower extremities. (able to move all 4 extremities rolytes: Sodium is 145,  potassium 4.3, chloride 106, bicarbonate 30, BUN 21, creatinine 1.03, glucose 105. Troponin less than 0.02. The patient's EKG shows sinus tachycardia with 114 beats minutes, no ST-T changes.   ASSESSMENT AND PLAN:  1.  The patient is a 59 year old female patient with respiratory failure secondary to chronic obstructive pulmonary disease exacerbation. The patient is admitted to  medical service under telemetry. Continue IV steroids along with nebulizers, oxygen and empirically start antibiotics with Zosyn, Zithromax and see how she does.  2.  Acute respiratory failure due to chronic obstructive pulmonary disease. Continue oxygen. Continue Spiriva, nebulizers and steroids.  3.  Hypertension. The patient is tachycardic as well, so I will increase the metoprolol to 25 mg twice daily.  4.  Her tachycardia is secondary to her respiratory failure.  5.  Gastroesophageal reflux disease. Continue proton pump inhibitors.  6.  History of anxiety. Continue temazepam and use p.r.n. Xanax at 0.5 mg b.i.d.  7.  Elevated white count, likely due to chronic obstructive pulmonary disease flare. At this time, monitor the trend and continue antibiotics and repeat chest x-ray in 24 hours. 8.  Tobacco abuse. Counseled against smoking for about 5 minutes. The patient says she wants to quit.    TIME SPENT: About 60 minutes on history and physical.    ____________________________ Epifanio Lesches, MD sk:TT D: 10/12/2013 16:36:54 ET T: 10/12/2013 16:57:08 ET JOB#: [id number]  cc: Epifanio Lesches, MD, <Dictator> Epifanio Lesches MD ELECTRONICALLY SIGNED 11/02/2013 10:10

## 2014-06-13 NOTE — H&P (Signed)
PATIENT NAME:  Andrea Bell, Andrea Bell MR#:  [id number] DATE OF BIRTH:  [birth year]  DATE OF ADMISSION:  08/15/2011  PRIMARY CARE PHYSICIAN: Delight Stare, MD  REQUESTING PHYSICIAN: Ferman Hamming, MD  CHIEF COMPLAINT: Shortness of breath.   HISTORY OF PRESENT ILLNESS: The patient is a 59 year old female with known history of hypertension and chronic obstructive pulmonary disease who is being admitted for chronic obstructive pulmonary disease exacerbation. The patient started having productive cough with clear sputum, wheezing, and shortness of breath for the last 2 to 4 days, and could not control it at home and decided to come to the emergency department as she was worried that she might have pneumonia. While in the ED, she was able to complete sentences without having any trouble breathing. Her chest was also tight and was saturating 89% on room air and she is being admitted for further evaluation and management.   PAST MEDICAL HISTORY:  1. Chronic obstructive pulmonary disease.  2. Hypertension. 3. Gastroesophageal reflux disease.  4. MGUS.  5. Gout.  6. Chronic anemia.   ALLERGIES: Codeine.   SOCIAL HISTORY: She is a former smoker, quit about four years ago, but restarted about a month ago. She was smoking 1/2 pack a day until now. Denies alcohol use or drug use. She does not work.  FAMILY HISTORY: Positive for hypertension and diabetes in her mother.   HOME MEDICATIONS: 1. DuoNeb inhaled four times a day as needed.  2. Aspirin 81 mg p.o. daily.  3. Colace 50 mg p.o. daily. 4. Cyclobenzaprine 5 mg p.o. three times daily as needed.  5. Estradiol 0.5 mg p.o. daily.  6. Estradiol/norethindrone one tablet p.o. daily.  7. Flovent 2 puffs inhaled twice a day.  8. Hydrochlorothiazide/lisinopril 1 tablet p.o. daily.  9. Protonix 40 mg p.o. daily.  10. Proventil 2 puffs inhaled four times a day as needed. 11. Spiriva once daily.  12. Tramadol 50 mg 1 tablet every four hours as needed.   13. Tylenol 1000 mg p.o. every six hours as needed.   REVIEW OF SYSTEMS: CONSTITUTIONAL: No fever. Positive fatigue and weakness. EYES: No blurred or double vision. ENT: No tinnitus or ear pain. RESPIRATORY: Positive for cough, wheezing, dyspnea, and history of chronic obstructive pulmonary disease. Also has clear sputum with cough. CARDIOVASCULAR: Some chest pain with constant coughing. No orthopnea or edema. GASTROINTESTINAL: No nausea, vomiting, or diarrhea. GU: No dysuria or hematuria. ENDOCRINE: No polyuria or nocturia. Positive for hot and cold flashes. HEMATOLOGY: Chronic anemia. No easy bruising or bleeding. SKIN: No rash or lesion. MUSCULOSKELETAL: No arthritis or muscle cramp. NEUROLOGIC: No tingling, numbness, or weakness. PSYCHIATRIC: No history of anxiety or depression.   PHYSICAL EXAMINATION:   VITAL SIGNS: Temperature 97.9, heart rate 84 per minute, respirations 20 per minute, blood pressure 101/64 mmHg, and saturating 94% on room air.  GENERAL:  The patient is lying in the bed comfortably without any acute distress.   EYES: Pupils are equal, round, and reactive to light and accommodation. No scleral icterus. Extraocular movements are intact.   HENT: Head atraumatic, normocephalic. Oral pharynx and nasopharynx clear.   NECK: Supple. No jugular venous distention. No thyroid enlargement or tenderness.  LUNGS: Decreased breath sounds bilaterally.  Extensive wheezing throughout both lungs. Also rhonchi. No rales.   CARDIOVASCULAR: S1 and S2 normal, tachycardic. No murmurs, rubs, or gallops.   ABDOMEN: Soft, nontender, and nondistended. Bowel sounds are present. No organomegaly or masses.   EXTREMITIES: No pedal edema, cyanosis, or clubbing.   NEUROLOGIC:  Nonfocal examination. Cranial nerves III through XII intact. Muscle strength 5/5. Extremity sensation intact.   PSYCHIATRIC: The patient is oriented to time, place, and person x3.   SKIN: No obvious rash, lesion, or ulcer.    LABORATORY, DIAGNOSTIC AND RADIOLOGIC DATA: Normal BMP. Normal liver function tests. Normal CBC.   Negative urinalysis.   Chest x-ray while in the emergency department showed no acute cardiopulmonary disease.   IMPRESSION AND PLAN:  1. Chronic obstructive pulmonary disease exacerbation. We will start on IV Solu-Medrol, Levaquin, DuoNeb breathing treatment, and continue her inhalers. Monitor her closely.  2. Hypertension. We will continue hydrochlorothiazide and lisinopril. Monitor her blood pressure.  3. Gastroesophageal reflux disease. We will continue Protonix.            4. Tobacco abuse. The patient was a former smoker and started smoking again about a month ago. We will provide smoking cessation counseling material. The patient was counseled for about three minutes at the bedside. She denies any need of nicotine replacement therapy while in the hospital.   TOTAL TIME TAKING CARE OF PATIENT: 45 minutes.  ____________________________ Lucina Mellow. Manuella Ghazi, MD vss:slb D: 08/15/2011 16:12:00 ET (Entered as incorrect work type - 09) T: 08/16/2011 14:18:24 ET JOB#: [id number]  cc: Marguerita Merles, MD Marizol Borror S. Manuella Ghazi, MD, <Dictator> Lucina Mellow [facility] MD ELECTRONICALLY SIGNED 08/19/2011 19:24

## 2014-06-13 NOTE — H&P (Signed)
PATIENT NAME:  Andrea Bell, Andrea Bell MR#:  [id number] DATE OF BIRTH:  08-04-1955  DATE OF ADMISSION:  03/16/2011  PRIMARY CARE PHYSICIAN: Delight Stare, MD   EMERGENCY ROOM PHYSICIAN: Dr. Beather Arbour   CHIEF COMPLAINT: Shortness of breath.   HISTORY OF PRESENT ILLNESS: The patient is a 59 year old female who presents with chief complaint of shortness of breath, wheezing, productive cough, patient bringing up clear phlegm. Symptoms began three days ago. The patient's breathing has been getting worse and she has had increased wheezing over the last couple of days. She denies any sick contacts. Denies any fevers or chills. She has had chest pain associated with the cough. The patient has been using Flovent and Proventil inhalers twice daily without much relief. She also uses nebulizers at home which has not helped her.   PAST MEDICAL HISTORY:  1. Chronic obstructive pulmonary disease.  2. Hypertension.  3. Gout. 4. MGUS. 5. Gastroesophageal reflux disease. 6. Anemia.   ALLERGIES: Codeine.   CURRENT MEDICATIONS:  1. Acetaminophen 325 two p.o. q.4 to 6 hours p.r.n.  2. Albuterol 2 puffs q.i.d. p.r.n.  3. Aspirin 81 mg p.o. daily.  4. Beclomethasone 80 mcg inhaler 1 puff b.i.d.  5. Claritin 10 mg p.o. daily.  6. Docusate 100 mg p.o. daily.  7. DuoNebs q.6 hours p.r.n.  8. Pantoprazole 40 mg p.o. b.i.d.  9. Estradiol/norethindrone 0.5/0.1 mg p.o. daily. 10. Flovent 110 mcg 2 puffs q.12.  11. Maalox p.r.n.  12. Nifedipine 30 mg per daily.  13. Nu-Iron 150 mg p.o. daily.  14. Polyethylene glycol 3350 p.o. p.r.n.  15. Spiriva 18 mcg inhaler daily.  16. Tramadol 50 mg p.o. q.6 hours p.r.n.   SOCIAL HISTORY: She denies tobacco abuse, alcohol abuse, or drug abuse.  FAMILY HISTORY: Significant for hypertension and diabetes.   REVIEW OF SYSTEMS: CONSTITUTIONAL: The patient denies any fevers, chills, night sweats. HEENT: The patient denies any hearing loss, dysphagia, visual problems, sore throat.  CARDIOVASCULAR: The patient denies any orthopnea, PND, syncope. RESPIRATORY: Please see history of present illness. GI: The patient denies any nausea, vomiting, abdominal pain, hematemesis, hematochezia, melena. The patient denies any diarrhea. GU: The patient denies any hematuria, dysuria, frequency. NEUROLOGIC: The patient denies any headache, focal weakness, seizures. SKIN: The patient denies any lesions or rash. ENDOCRINE: The patient denies polyuria, polyphagia, polydipsia, heat or cold intolerance. MUSCULOSKELETAL: The patient denies any arthralgias, myalgias, joint swelling, tenderness. HEMATOLOGICAL: The patient denies any easy bleeding or bruises.   PHYSICAL EXAMINATION:   VITAL SIGNS: Temperature 100.4, heart rate is 101, respiratory rate 20, blood pressure 114/68, oxygen sat 99%.   HEENT: Atraumatic, normocephalic. Pupils equal, round, and reactive to light and accommodation. Extraocular movements intact. Sclerae anicteric. Mucous membranes dry.    NECK: Supple. No organomegaly.   CARDIOVASCULAR: S1, S2, regular rate, rhythm. No gallops. No thrills. No murmurs.   RESPIRATORY: The patient has coarse expiratory wheezes bilaterally.   GI: Abdomen is soft, nontender, and nondistended. Normal bowel sounds. No hepatosplenomegaly.   GU: There is no hematuria or masses noted.   SKIN: No lesions, no rash.   ENDOCRINE: No masses, no thyromegaly.   LYMPH: No lymphadenopathy or nodes palpable.   NEUROLOGIC: Cranial nerves II through XII grossly intact. Motor strength is 5/5 bilateral upper and lower extremities. Sensation within normal limits. No focal neurological deficits noted on examination.   MUSCULOSKELETAL: No arthritis, joint effusion, swelling.   HEMATOLOGIC: No ecchymosis, no bleeding, no petechiae.   EXTREMITIES: No cyanosis, no clubbing, no edema. 2+  pedal pulses noted bilaterally.   LABORATORY, DIAGNOSTIC, AND RADIOLOGICAL DATA: Chest x-ray is negative.   WBC count  16,500, hemoglobin 13.5, hematocrit 40.9, platelet count 312, glucose 122, BUN 15, creatinine 0.85, sodium 139, potassium 3.8, chloride 101, CO2 27, total calcium 9.2, total bilirubin 0.4, alkaline phosphatase 73, ALT 19, AST 19, total protein 8.1, albumin 3.8, estimated GFR greater than 60. Troponin less than 0.02.   ASSESSMENT AND PLAN:  1. The patient is a 59 year old female who presents with chief complaint of wheezing, shortness of breath, and acute chronic obstructive pulmonary disease. Admit to medical floor. Start COPD clinical pathway orders. IV Solu-Medrol. Rocephin. IV fluids. Nebs.  2. Hypertension. Continue nifedipine. 3. Gastroesophageal reflux disease. Continue PPI.  4. Anemia. Continue iron.   ____________________________ Tyrone Schimke, MD jsp:drc D: 03/16/2011 05:50:59 ET T: 03/16/2011 06:51:22 ET JOB#: [id number]  cc: Tyrone Schimke, MD, <Dictator> Marguerita Merles, MD Tyrone Schimke MD ELECTRONICALLY SIGNED 03/16/2011 22:43

## 2014-06-13 NOTE — Consult Note (Signed)
PATIENT NAME:  Andrea Bell, Andrea Bell MR#:  [id number] DATE OF BIRTH:  October 02, 1955  DATE OF CONSULTATION:  08/18/2011  REFERRING PHYSICIAN:   CONSULTING PHYSICIAN:  Allyne Gee, MD  REASON FOR CONSULTATION:  Acute respiratory failure, chronic obstructive pulmonary disease exacerbation.  HISTORY: A 59 year old female who has multiple medical issues including chronic obstructive pulmonary disease and ongoing tobacco use. She presented to the hospital on the 26th of June. At that time she had been having a two or four day worsening of her shortness of breath. The patient had been using inhalers, but she did not really note any benefit so she came to the Emergency Room. When she was seen in the ED, she was noted to be significantly hypoxic and oxygen therapy was started. She was also given aggressive nebulizer therapies. She was also given steroids. She apparently has a history of smoking about a half-pack a day and she says that she had quit, but restarted smoking about a month ago. I am not sure about the inciting event. There is no alcohol or drug use noted.   PAST MEDICAL HISTORY:  1. Chronic obstructive pulmonary disease.  2. Hypertension.  3. Gastroesophageal reflux disease. 4. Gout.  5. Chronic anemia.   ALLERGIES: Codeine.   FAMILY HISTORY: Positive for diabetes and hypertension.   SOCIAL HISTORY: As noted above.   MEDICATIONS: Medications are reviewed on the electronic medical record.   REVIEW OF SYSTEMS: GENERAL: She has not been having any fevers, but there is some weakness. HEENT: Negative for diplopia. No epistaxis. She has had no ringing in the ears. RESPIRATORY: Positive for shortness of breath, wheezing, and coughing. CARDIOVASCULAR: Positive for tightness in the chest. GASTROINTESTINAL: Negative for nausea, vomiting, or diarrhea. MUSCULOSKELETAL: No acute synovitis. HEMATOLOGIC: Negative for any bruising. SKIN: Without any acute rashes. PSYCHIATRIC: Negative for any depression.  NEUROLOGIC: Negative for any focal deficits. ENDOCRINE: Negative for polyuria or polydipsia.   PHYSICAL EXAMINATION:  VITAL SIGNS: Temperature 98.1, pulse 82, respiratory rate 20, blood pressure 114/71, saturation 94%.   NECK: Supple. There was no jugular venous distention. No adenopathy. No thyromegaly.   CHEST: Chest showed very coarse breath sounds with few rhonchi. There were no rales noted.   CARDIOVASCULAR: S1, S2 is normal. Regular rhythm. No gallop or rub.   ABDOMEN: Soft and nontender.   EXTREMITIES: Without cyanosis or clubbing. Pulse equal.  SKIN: No acute rashes.   MUSCULOSKELETAL: No synovitis.   LABORATORY DATA: Her labs at the time that she was evaluated show glucose 169, BUN 22, creatinine 1, sodium 136, potassium 39. Her white count was 20.7, hemoglobin 11.6. The white count has actually gone up from 8.6 to 20.7. Chest x-ray diagnostic study shows no acute disease.   IMPRESSION:  1. Acute respiratory failure secondary to chronic obstructive pulmonary disease exacerbation.  2. Ongoing tobacco abuse.   PLAN: She was counseled aggressively on tobacco cessation and the dangers this imposes to her condition. She also will be continued on steroids, continue with inhalers. She is possibly a candidate for Daliresp, which can be looked at as an outpatient. She is on ipratropium, which     should be continued. She also might be a candidate for theophylline and I will assess her for this. We will monitor further recommendations as deemed necessary.   ____________________________ Allyne Gee, MD sak:ap D: 08/18/2011 13:00:34 ET T: 08/18/2011 13:21:29 ET JOB#: [id number]  cc: Allyne Gee, MD, <Dictator> Allyne Gee MD ELECTRONICALLY SIGNED 09/07/2011 13:14

## 2014-06-13 NOTE — Discharge Summary (Signed)
PATIENT NAME:  Andrea Bell, DEVENEY MR#:  [id number] DATE OF BIRTH:  07/02/1955  DATE OF ADMISSION:  03/16/2011 DATE OF DISCHARGE:  03/16/2011  For a detailed note, please take a look at the history and physical done by Dr. Lenore Cordia on admission.   DIAGNOSES AT DISCHARGE:  1. Chronic obstructive pulmonary disease exacerbation.  2. Hypertension.  3. Gastroesophageal reflux disease.  4. Vertigo.  5. Chronic arthritis.   DIET: The patient is being discharged on a low-sodium diet.   ACTIVITY: As tolerated.   FOLLOWUP:  Followup is with Dr. Delight Stare in the next 1 to 2 weeks.   DISCHARGE MEDICATIONS:  1. Albuterol 2 puffs q.i.d. as needed.  2. Colace 100 mg daily.  3. Aspirin 81 mg daily.  4. Flovent 2 puffs every 12 hours.   5. DuoNebs q.6 hours p.r.n.  6. Claritin 10 mg daily as needed.  7. Multivitamin daily.  8. Estradiol with norethisterone 0.5/0.1 one tab daily.  9. Tylenol 650 q.4-6h. p.r.n.  10. Amitriptyline 25 mg at bedtime.  11. Zestoretic 10/12.5 one tab daily.  12. Tessalon Perles t.i.d. as needed.   13. Diclofenac 100 mg b.i.d. as needed.  14. Protonix 40 mg daily.  15. Meclizine 25 mg t.i.d. as needed.  1. DayQuil as needed.   17. Prednisone taper starting at 50 mg down to 10 mg over the next 5 days.   18. Levaquin 500 mg daily x5 days.   PERTINENT STUDIES DONE DURING THE HOSPITAL COURSE: A chest x-ray done on admission showing hyperinflation suggestive of chronic obstructive pulmonary disease. No acute cardiopulmonary disease.   HOSPITAL COURSE: This is a 60 year old female with medical problems as mentioned above who presented to the hospital with shortness of breath and a cough and congestion.  1. Chronic obstructive pulmonary disease exacerbation, likely the cause of her shortness of breath and congestion. She was started on IV steroids, around-the-clock nebulizer treatments, and also empirically on ceftriaxone and Zithromax. Her chest x-ray did not show any  evidence of pneumonia. She does not have any myalgias or any other symptoms consistent with the flu. After getting treatment with steroids and nebulizer treatments and empiric antibiotics, she is clinically much better. She was ambulated on room air, does not have any further desaturations, and is not hypoxemic. She is therefore being discharged on her home medications as mentioned above. She will be discharged on a prednisone taper and Levaquin as stated.  2. Gastroesophageal reflux disease. The patient was maintained on her Protonix; she will resume that.  3. Hypertension. The patient remained hemodynamically stable on her Zestoretic and Adalat. She will resume that.  4. Vertigo. The patient is on meclizine as needed. She will resume that.  5. Chronic arthritis. The patient is taking diclofenac for arthritis along with some ibuprofen as needed. She will also resume that upon discharge.   CODE STATUS: The patient is a full code.   TIME SPENT: 35 minutes.   ____________________________ Belia Heman. Verdell Carmine, MD vjs:vtd D: 03/16/2011 15:11:19 ET   T: 03/17/2011 10:04:17 ET   JOB#: [id number] cc: Belia Heman. Verdell Carmine, MD, <Dictator> Marguerita Merles, MD Henreitta Leber MD ELECTRONICALLY SIGNED 03/17/2011 15:13

## 2014-06-13 NOTE — H&P (Signed)
PATIENT NAME:  Andrea Bell, Andrea Bell MR#:  [id number] DATE OF BIRTH:  1955/03/29  DATE OF ADMISSION:  04/07/2011  ED REFERRING PHYSICIAN: Dr. Prince Rome  PRIMARY CARE PHYSICIAN: Dr. Delight Stare  CHIEF COMPLAINT: Shortness of breath, cough.   HISTORY OF PRESENT ILLNESS: Patient is a 59 year old female who has a history of chronic obstructive pulmonary disease, hypertension, gout, MGUS, gastroesophageal reflux disease, chronic anemia who presents to the ED with chief complaint of shortness of breath, wheezing, productive cough for the past few days. Patient was given treatment with nebulizers without any significant improvement. She reports that her cough has been productive of white phlegm. Has not had any fevers or chills. She complains of dyspnea on exertion. She also complains of diffuse chest pain as a result of the cough. She also has abdominal discomfort as a result of the cough. She has not had any sick contacts. She was last seen here on 03/16/2011. At that time was hospitalized overnight for chronic obstructive pulmonary disease exacerbation with improvement in her symptoms quickly.   PAST MEDICAL HISTORY:  1. Chronic obstructive pulmonary disease.  2. Hypertension.  3. Gout.  4. MGUS.  5. Gastroesophageal reflux disease.  6. Chronic anemia.   ALLERGIES: Codeine.   CURRENT MEDICATIONS: According to the list that is available.  1. Albuterol Atrovent nebulizers 4 times a day as needed. 2. DayQuil as needed. 3. Diclofenac 75, 1 tab p.o. b.i.d.  4. Estradiol/norethindrone 0.5/0.1 mg 1 tab p.o. daily.  5. Flovent 110 mcg 2 puffs q.12. 6. Hydrochlorothiazide/lisinopril 12.5/10, 1 tab p.o. daily.  7. Protonix 40 daily.  8. Proventil 2 puffs 4 times daily as needed.  9. Tylenol Extra Strength 500 mg q.6 p.r.n. for pain.   SOCIAL HISTORY: Patient denies tobacco abuse, alcohol abuse or drug abuse.   FAMILY HISTORY: Positive for hypertension and diabetes.    REVIEW OF SYSTEMS: CONSTITUTIONAL:  Denies any fevers. Complains of fatigue, weakness. No pain. No weight loss. No weight gain. EYES: No blurred or double vision. No pain. No redness. No inflammation. No glaucoma. No cataracts. ENT: No tinnitus. No ear pain. No hearing loss. No seasonal or year-round allergies. No nasal discharge. No snoring. No postnasal drip. No sinus pain. RESPIRATORY: Complains of productive cough, wheezing. No hemoptysis. Complains of dyspnea. No asthma. No painful respiration. Does have chronic obstructive pulmonary disease. No tuberculosis. No pneumonia. CARDIOVASCULAR: Complains of diffuse chest pain as a result of coughing. Denies orthopnea or edema. No arrhythmia. Complains of dyspnea on exertion. No palpitations. No syncope. Does have hypertension. No varicose veins. GASTROINTESTINAL: No nausea, vomiting, diarrhea. No abdominal pain. No hematemesis. No melena. No ulcer. No gastroesophageal reflux disease. No irritable bowel syndrome. No jaundice. No rectal bleeding. No changes in bowel habits. GENITOURINARY: Denies any dysuria, hematuria, renal calculi, frequency, incontinence. ENDO: Denies any polyuria, nocturia, or thyroid problems. No increase in sweating, heat or cold intolerance or thirst. HEME/LYMPH: Does have history of anemia, easy bruisability. No bleeding or swollen glands. SKIN: No acne. No rash. No changes mole, hair or skin. MUSCULOSKELETAL: Does have history of gout. Denies any recent gout attacks. No pain in neck, back, or shoulder. NEUROLOGIC: No numbness. No weakness. No dysarthria. No epilepsy. No cerebrovascular accident. No transient ischemic attack. PSYCHIATRIC: No anxiety. No insomnia. No ADD. No OCD. No bipolar, depression or schizophrenia.   PHYSICAL EXAMINATION:  VITAL SIGNS: Temperature 99.1, pulse 78, respirations 24, blood pressure 130/77, O2 93% on 2 liters.   GENERAL: Patient is well developed, well nourished female in  mild respiratory distress.   HEENT: Head atraumatic. Pupils equal,  round, reactive to light and accommodation. Extraocular movements intact. There is no conjunctival pallor. No scleral icterus. Nasal exam shows no drainage. No ulcerations. Oropharynx is clear without any exudates.   NECK: There is no thyromegaly. No carotid bruits.   CARDIOVASCULAR: Regular rate and rhythm. No murmurs, rubs, clicks, or gallops. PMI is not displaced.   LUNGS: Bilateral wheezing in the posterior aspect of her lungs. There are no rales or rhonchi.   ABDOMEN: Soft, nontender, nondistended. Positive bowel sounds x4. There is no splenomegaly.   EXTREMITIES: No clubbing, cyanosis, edema.   NEUROLOGIC: Awake, alert, oriented x3. Cranial nerves II through XII grossly intact. No focal deficits.   VASCULAR: Good DP, PT pulses.   LYMPHATICS: No lymph nodes palpable.   NEUROLOGICAL: Awake, alert, oriented x3. Cranial nerves II through XII grossly intact. No focal deficits.   SKIN: There is no rash.   PSYCHIATRIC: Not anxious or depressed.   LABORATORY, DIAGNOSTIC AND RADIOLOGICAL DATA: BMP: Glucose 99, BUN 19, creatinine 0.81, sodium 142, potassium 3.8, chloride 103, CO2 30, calcium 9.1 BNP 72. WBC 12.5, hemoglobin 12.3, platelet count 340. Chest x-ray PA and lateral shows no acute cardiopulmonary processes.   ASSESSMENT AND PLAN: Patient is a 59 year old female with history of chronic obstructive pulmonary disease presents with shortness of breath and cough.  1. Acute on chronic obstructive pulmonary disease exacerbation. At this time will place her on IV Solu-Medrol 60 q.8. Continue her nebulizers while awake with albuterol and Atrovent nebulizers. She also likely has acute bronchitis. Will place her on IV Levaquin for time being. Once improves can switch over to p.o. Levaquin. Will follow her respiratory status, titrate O2 as needed.  2. Hypertension. Will continue hydrochlorothiazide, lisinopril as taking at home. Hold if her blood pressure is low.  3. Gout. Currently not taking  any medications. Continue Tylenol and diclofenac p.r.n. as taking at home.  4. Miscellaneous. Will place her on Lovenox for deep vein thrombosis prophylaxis.  5. CODE STATUS: FULL CODE.   TIME SPENT: 35 minutes spent on this history and physical.   ____________________________ Lafonda Mosses. Posey Pronto, MD shp:cms D: 04/07/2011 09:38:20 ET T: 04/07/2011 10:01:47 ET JOB#: [id number]  cc: Hydeia Mcatee H. Posey Pronto, MD, <Dictator> Marguerita Merles, MD  Alric Seton MD ELECTRONICALLY SIGNED 04/27/2011 7:48

## 2014-06-13 NOTE — Discharge Summary (Signed)
PATIENT NAME:  Andrea Bell, Andrea Bell MR#:  [id number] DATE OF BIRTH:  August 10, 1955  DATE OF ADMISSION:  08/15/2011 DATE OF DISCHARGE:  08/20/2011  PRIMARY CARE PHYSICIAN: [facility]  FINAL DIAGNOSES:  1. Chronic obstructive pulmonary disease exacerbation.  2. Hypertension.  3. Gastroesophageal reflux disease.  4. Gout.  5. Leukocytosis.   MEDICATIONS ON DISCHARGE:  1. Flovent 2 puffs every 12 hours.  2. Protonix 40 mg daily.  3. Tylenol extra strength 500 mg 2 tablets every six hours as needed for pain or fever. 4. Proventil HFA 2 puffs 4 times a day. 5. Albuterol ipratropium nebulizer 4 times a day.  6. Lisinopril hydrochlorothiazide 10/12.5 mg 1 tablet daily.  7. Spiriva 1 inhalation daily.  8. Aspirin 81 mg daily.  9. Tramadol 50 mg orally q.4 hours. 10. Estradiol 0.5 mg daily.  11. Colace 50 mg daily.  12. Prednisone taper 5 tabs day one, 4 tabs day two, 3 tabs day three,  2 tabs day four, 1 tab day five, half tab day six and seven and that is a 10 mg tab.  13. Levaquin 500 mg daily until finished.  14. Tussionex 5 mL every 12 hours as needed for cough.   DIET: Low sodium diet.   ACTIVITY: Activity as tolerated.   FOLLOW UP: Follow up with Dr. Devona Konig in 2 to 4 weeks; [facility] in 1 to 2 weeks.   REASON FOR ADMISSION: Patient was admitted 08/15/2011, discharged 08/20/2011. Patient came in with shortness of breath.   HISTORY OF PRESENT ILLNESS: 59 year old with history of hypertension, chronic obstructive pulmonary disease. Patient was admitted with chronic obstructive pulmonary disease exacerbation, started on IV Solu-Medrol and antibiotics.   LABORATORY, DIAGNOSTIC, AND RADIOLOGICAL DATA: No acute cardiopulmonary disease. EKG showed a normal sinus rhythm, right bundle branch block, left anterior fascicular block. Blood cultures negative. Troponin negative. Glucose 134, BUN 14, creatinine 0.84, sodium 136, potassium 3.5, chloride 102. White blood cell count 8.6, hemoglobin  and hematocrit 12.2 and 39.2, platelet count 329. Urinalysis 1+ blood, white count went up to 20.7 on the 27th and down to 15.2 on 07/01. Repeat chest x-ray on the 27th showed no acute cardiopulmonary disease.   HOSPITAL COURSE PER PROBLEM LIST:  1. For the patient's chronic obstructive pulmonary disease exacerbation, the patient took a long time to actually start to feel better, was on high dose Solu-Medrol for quite a long period of time. Was in consultation by Dr. Humphrey Rolls, pulmonary. Follow up is going to be needed as outpatient with pulmonary for pulmonary function tests. Dr. Humphrey Rolls also wrote in his note may be a candidate for Daliresp but pulmonary function tests will need to be done when she is feeling better. Patient will be continued on her Flovent, Spiriva and Proventil. She will be given a prednisone taper and Levaquin, Tussionex p.r.n. for her cough. 2. For her hypertension, she is on lisinopril hydrochlorothiazide. 3. For her gastroesophageal reflux disease, she is on Protonix. 4. Leukocytosis, most likely secondary to steroids. 5. Patient up discharge did have some expiratory wheeze on exam but the patient felt well, breathing almost back to baseline and will be switched over to a steroid taper upon discharge.   TIME SPENT ON DISCHARGE: 32 minutes.   ____________________________ Tana Conch. Leslye Peer, MD rjw:cms D: 08/20/2011 14:28:00 ET T: 08/21/2011 11:20:18 ET  JOB#: [id number] cc: Tana Conch. Leslye Peer, MD, <Dictator> [place] MD ELECTRONICALLY SIGNED 08/23/2011 12:20

## 2014-06-13 NOTE — Discharge Summary (Signed)
PATIENT NAME:  Andrea Bell, Andrea Bell MR#:  [id number] DATE OF BIRTH:  09/14/1955  DATE OF ADMISSION:  04/07/2011 DATE OF DISCHARGE:  04/11/2011  PRIMARY CARE PHYSICIAN: Delight Stare, MD  CHIEF COMPLAINT: Shortness of breath.   DISCHARGE DIAGNOSIS: Acute chronic obstructive pulmonary disease exacerbation.  SECONDARY DIAGNOSES: 1.  Hypertension.  2. History of gout.  3. Monoclonal gammopathy of undetermined significance. 4. Gastroesophageal reflux disease. 5. Chronic anemia.   DISCHARGE MEDICATIONS:  1. Spiriva 18 mcg one cap inhaled daily.  2. Prednisone 40 mg daily for two days, then 30 mg daily for two days, then 20 mg daily for two days, then 10 mg daily for two days, then stop. 3. Flovent 110 mcg inhaled 2 puffs every 12 hours. 4. Estradiol/norethindrone 0.5 mg/0.1 mg one tab daily.  5. Pantoprazole 40 mg daily.  6. Tylenol extra-strength 2 tabs every six hours as needed for pain. 7. DayQuil as needed.  8. ProAir 90 mcg HFA 2 puffs every six hours as needed for wheezing.  9. Albuterol/ipratropium 2.5/0.5 mg/mL inhaled solution, 3 mL inhaled four times daily as needed for wheezing. 10. HCTZ/lisinopril 12.5/10 mg one tab daily.  11. Mucinex 600 mg twice a day for five days.   DISCHARGE INSTRUCTIONS: Please stop taking diclofenac.  DIET: Low sodium, ADA diet.    ACTIVITY: As tolerated.   DISCHARGE FOLLOWUP:  Followup with your PCP within 1 to 2 weeks.   HISTORY OF PRESENT ILLNESS: For full details, please see the History and Physical dictated by Dr. Posey Pronto on 04/07/2011, but briefly this is a 59 year old female who presents with shortness of breath and productive cough for the past few days. She did not have any significant improvement with nebulizers and presented to the hospital. She had no evidence of pneumonia on initial x-ray. She had mild leukocytosis of 12.5 thousand and was admitted to the hospitalist service for further evaluation and management.   SIGNIFICANT LABS/STUDIES:  Sputum cultures showed heavy growth of respiratory flora, a few WBCs, a few gram-positive cocci, a few gram-negative rods, nothing speciated.  Initial hemoglobin was 12.3 and hematocrit 38. WBC on arrival was 12.5 and by discharge 14.1. Creatinine 0.81 on arrival.  X-ray of the chest, PA and lateral, showed no acute cardiopulmonary disease.   HOSPITAL COURSE: The patient was admitted to the hospitalist service without fevers or evidence of pneumonia on x-ray. For COPD exacerbation, she was started on steroids and Levaquin. She maintained good oxygenation and did not spike a fever, although she did have an up-trend in leukocytosis likely in the setting of steroid use. She is on Flovent as an outpatient, however, she is not on Spiriva, and that was started here. She has improved dramatically, is not short of breath and is ambulating in the hallway. She still has mild mostly upper respiratory wheezing and states that this is her baseline. She is not hypoxic and is maintaining good oxygen saturations. She has been getting nebulizers around-the-clock, as well as cough medicine. She has had five days of Levaquin. At this point, she will be discharged with prednisone taper and Mucinex with Spiriva, in addition to her outpatient inhalers. Furthermore her NSAID, which she does not know why she is on, will be stopped as it can also cause bronchospasms and wheezing. While hospitalized her blood pressure was controlled and outpatient medications were resumed including HCTZ and lisinopril. At this point, she will be discharged with outpatient follow-up with her PCP.        DISPOSITION: Home.  TOTAL TIME SPENT: 35 minutes.  ____________________________ Vivien Presto, MD sa:slb D: 04/11/2011 13:13:43 ET T: 04/11/2011 14:27:37 ET JOB#: [id number]  cc: Vivien Presto, MD, <Dictator> Marguerita Merles, MD Vivien Presto MD ELECTRONICALLY SIGNED 04/21/2011 8:55

## 2014-07-27 ENCOUNTER — Other Ambulatory Visit: Payer: Self-pay | Admitting: Family Medicine

## 2014-07-27 DIAGNOSIS — Z139 Encounter for screening, unspecified: Secondary | ICD-10-CM

## 2014-08-04 ENCOUNTER — Ambulatory Visit
Admission: RE | Admit: 2014-08-04 | Discharge: 2014-08-04 | Disposition: A | Discharge status: Home / Self Care | Payer: Medicaid Other | Source: Ambulatory Visit | Attending: Family Medicine | Admitting: Family Medicine

## 2014-08-04 DIAGNOSIS — Z1231 Encounter for screening mammogram for malignant neoplasm of breast: Secondary | ICD-10-CM | POA: Diagnosis not present

## 2014-08-04 DIAGNOSIS — Z139 Encounter for screening, unspecified: Secondary | ICD-10-CM

## 2014-09-07 ENCOUNTER — Ambulatory Visit
Admission: RE | Admit: 2014-09-07 | Discharge: 2014-09-07 | Disposition: A | Discharge status: Home / Self Care | Payer: Medicaid Other | Source: Ambulatory Visit | Attending: Family Medicine | Admitting: Family Medicine

## 2014-09-07 ENCOUNTER — Other Ambulatory Visit: Payer: Self-pay | Admitting: Family Medicine

## 2014-09-07 DIAGNOSIS — M5136 Other intervertebral disc degeneration, lumbar region: Secondary | ICD-10-CM | POA: Insufficient documentation

## 2014-09-07 DIAGNOSIS — M5440 Lumbago with sciatica, unspecified side: Secondary | ICD-10-CM

## 2014-09-07 DIAGNOSIS — M549 Dorsalgia, unspecified: Secondary | ICD-10-CM | POA: Diagnosis present

## 2014-09-07 DIAGNOSIS — M5134 Other intervertebral disc degeneration, thoracic region: Secondary | ICD-10-CM | POA: Diagnosis not present

## 2014-12-26 ENCOUNTER — Inpatient Hospital Stay
Admission: EM | Admit: 2014-12-26 | Discharge: 2014-12-28 | DRG: 191 | Disposition: A | Discharge status: Home / Self Care | Payer: Medicaid Other | Attending: Internal Medicine | Admitting: Internal Medicine

## 2014-12-26 ENCOUNTER — Emergency Department: Payer: Medicaid Other

## 2014-12-26 ENCOUNTER — Encounter: Payer: Self-pay | Admitting: Internal Medicine

## 2014-12-26 DIAGNOSIS — M1990 Unspecified osteoarthritis, unspecified site: Secondary | ICD-10-CM | POA: Diagnosis present

## 2014-12-26 DIAGNOSIS — G8929 Other chronic pain: Secondary | ICD-10-CM | POA: Diagnosis present

## 2014-12-26 DIAGNOSIS — I129 Hypertensive chronic kidney disease with stage 1 through stage 4 chronic kidney disease, or unspecified chronic kidney disease: Secondary | ICD-10-CM | POA: Diagnosis present

## 2014-12-26 DIAGNOSIS — M810 Age-related osteoporosis without current pathological fracture: Secondary | ICD-10-CM | POA: Diagnosis present

## 2014-12-26 DIAGNOSIS — Z87891 Personal history of nicotine dependence: Secondary | ICD-10-CM | POA: Diagnosis not present

## 2014-12-26 DIAGNOSIS — R51 Headache: Secondary | ICD-10-CM | POA: Diagnosis present

## 2014-12-26 DIAGNOSIS — M545 Low back pain: Secondary | ICD-10-CM | POA: Diagnosis present

## 2014-12-26 DIAGNOSIS — K219 Gastro-esophageal reflux disease without esophagitis: Secondary | ICD-10-CM | POA: Diagnosis present

## 2014-12-26 DIAGNOSIS — I248 Other forms of acute ischemic heart disease: Secondary | ICD-10-CM | POA: Diagnosis present

## 2014-12-26 DIAGNOSIS — R7989 Other specified abnormal findings of blood chemistry: Secondary | ICD-10-CM

## 2014-12-26 DIAGNOSIS — Z803 Family history of malignant neoplasm of breast: Secondary | ICD-10-CM

## 2014-12-26 DIAGNOSIS — R778 Other specified abnormalities of plasma proteins: Secondary | ICD-10-CM

## 2014-12-26 DIAGNOSIS — Z820 Family history of epilepsy and other diseases of the nervous system: Secondary | ICD-10-CM | POA: Diagnosis not present

## 2014-12-26 DIAGNOSIS — Z833 Family history of diabetes mellitus: Secondary | ICD-10-CM | POA: Diagnosis not present

## 2014-12-26 DIAGNOSIS — Z886 Allergy status to analgesic agent status: Secondary | ICD-10-CM

## 2014-12-26 DIAGNOSIS — Z9981 Dependence on supplemental oxygen: Secondary | ICD-10-CM | POA: Diagnosis not present

## 2014-12-26 DIAGNOSIS — N189 Chronic kidney disease, unspecified: Secondary | ICD-10-CM | POA: Diagnosis present

## 2014-12-26 DIAGNOSIS — Z8249 Family history of ischemic heart disease and other diseases of the circulatory system: Secondary | ICD-10-CM

## 2014-12-26 DIAGNOSIS — J441 Chronic obstructive pulmonary disease with (acute) exacerbation: Secondary | ICD-10-CM | POA: Diagnosis present

## 2014-12-26 DIAGNOSIS — R079 Chest pain, unspecified: Secondary | ICD-10-CM

## 2014-12-26 HISTORY — DX: Anemia, unspecified: D64.9

## 2014-12-26 HISTORY — DX: Other chronic pain: G89.29

## 2014-12-26 HISTORY — DX: Gastro-esophageal reflux disease without esophagitis: K21.9

## 2014-12-26 HISTORY — DX: Unspecified osteoarthritis, unspecified site: M19.90

## 2014-12-26 HISTORY — DX: Dorsalgia, unspecified: M54.9

## 2014-12-26 HISTORY — DX: Age-related osteoporosis without current pathological fracture: M81.0

## 2014-12-26 HISTORY — DX: Tachycardia, unspecified: R00.0

## 2014-12-26 HISTORY — DX: Essential (primary) hypertension: I10

## 2014-12-26 HISTORY — DX: Chronic obstructive pulmonary disease, unspecified: J44.9

## 2014-12-26 LAB — TROPONIN I
Troponin I: 0.14 ng/mL — ABNORMAL HIGH (ref <0.031)
Troponin I: <0.03 ng/mL (ref <0.031)

## 2014-12-26 LAB — BASIC METABOLIC PANEL
Anion gap: 6 (ref 5–15)
BUN: 17 mg/dL (ref 6–20)
CALCIUM: 9.2 mg/dL (ref 8.9–10.3)
CHLORIDE: 103 mmol/L (ref 101–111)
CO2: 29 mmol/L (ref 22–32)
CREATININE: 0.84 mg/dL (ref 0.44–1.00)
GFR calc non Af Amer: >60 mL/min (ref >60)
GLUCOSE: 108 mg/dL — AB (ref 65–99)
Potassium: 3.9 mmol/L (ref 3.5–5.1)
Sodium: 138 mmol/L (ref 135–145)

## 2014-12-26 LAB — BLOOD GAS, VENOUS
ACID-BASE EXCESS: 4.8 mmol/L — AB (ref 0.0–3.0)
Bicarbonate: 29.9 mEq/L — ABNORMAL HIGH (ref 21.0–28.0)
PATIENT TEMPERATURE: 37
PH VEN: 7.43 (ref 7.320–7.430)
pCO2, Ven: 45 mmHg (ref 44.0–60.0)

## 2014-12-26 LAB — CBC
HEMATOCRIT: 42.3 % (ref 35.0–47.0)
HEMOGLOBIN: 14.1 g/dL (ref 12.0–16.0)
MCH: 30.7 pg (ref 26.0–34.0)
MCHC: 33.4 g/dL (ref 32.0–36.0)
MCV: 92 fL (ref 80.0–100.0)
Platelets: 224 10*3/uL (ref 150–440)
RBC: 4.59 MIL/uL (ref 3.80–5.20)
RDW: 13.3 % (ref 11.5–14.5)
WBC: 10.9 10*3/uL (ref 3.6–11.0)

## 2014-12-26 MED ORDER — IPRATROPIUM-ALBUTEROL 0.5-2.5 (3) MG/3ML IN SOLN
3.0000 mL | Freq: Once | RESPIRATORY_TRACT | Status: AC
  Administered 2014-12-26: 3 mL via RESPIRATORY_TRACT
  Filled 2014-12-26: qty 3

## 2014-12-26 MED ORDER — ONDANSETRON HCL 4 MG/2ML IJ SOLN: 4.0000 mg | Freq: Four times a day (QID) | INTRAMUSCULAR | Status: DC | PRN

## 2014-12-26 MED ORDER — ASPIRIN 81 MG PO CHEW
324.0000 mg | CHEWABLE_TABLET | Freq: Once | ORAL | Status: AC
  Administered 2014-12-26: 324 mg via ORAL
  Filled 2014-12-26: qty 4

## 2014-12-26 MED ORDER — CETYLPYRIDINIUM CHLORIDE 0.05 % MT LIQD: 7.0000 mL | Freq: Two times a day (BID) | OROMUCOSAL | Status: DC

## 2014-12-26 MED ORDER — MELOXICAM 7.5 MG PO TABS
7.5000 mg | ORAL_TABLET | Freq: Two times a day (BID) | ORAL | Status: DC
  Administered 2014-12-26 – 2014-12-28 (×5): 7.5 mg via ORAL
  Filled 2014-12-26 (×5): qty 1

## 2014-12-26 MED ORDER — METHYLPREDNISOLONE SODIUM SUCC 40 MG IJ SOLR
40.0000 mg | Freq: Two times a day (BID) | INTRAMUSCULAR | Status: DC
  Administered 2014-12-26 – 2014-12-28 (×4): 40 mg via INTRAVENOUS
  Filled 2014-12-26 (×5): qty 1

## 2014-12-26 MED ORDER — INFLUENZA VAC SPLIT QUAD 0.5 ML IM SUSY
0.5000 mL | PREFILLED_SYRINGE | INTRAMUSCULAR | Status: AC
  Administered 2014-12-27: 0.5 mL via INTRAMUSCULAR
  Filled 2014-12-26: qty 0.5

## 2014-12-26 MED ORDER — PANTOPRAZOLE SODIUM 40 MG PO TBEC
40.0000 mg | DELAYED_RELEASE_TABLET | Freq: Every day | ORAL | Status: DC
  Administered 2014-12-26 – 2014-12-28 (×3): 40 mg via ORAL
  Filled 2014-12-26 (×3): qty 1

## 2014-12-26 MED ORDER — TRAZODONE HCL 100 MG PO TABS
100.0000 mg | ORAL_TABLET | Freq: Every day | ORAL | Status: DC
  Administered 2014-12-26 – 2014-12-27 (×2): 100 mg via ORAL
  Filled 2014-12-26 (×2): qty 1

## 2014-12-26 MED ORDER — IPRATROPIUM-ALBUTEROL 0.5-2.5 (3) MG/3ML IN SOLN
3.0000 mL | RESPIRATORY_TRACT | Status: DC
  Administered 2014-12-26 – 2014-12-28 (×12): 3 mL via RESPIRATORY_TRACT
  Filled 2014-12-26 (×14): qty 3

## 2014-12-26 MED ORDER — SODIUM CHLORIDE 0.9 % IJ SOLN
3.0000 mL | Freq: Two times a day (BID) | INTRAMUSCULAR | Status: DC
  Administered 2014-12-26 – 2014-12-28 (×5): 3 mL via INTRAVENOUS

## 2014-12-26 MED ORDER — GUAIFENESIN-DM 100-10 MG/5ML PO SYRP
5.0000 mL | ORAL_SOLUTION | ORAL | Status: DC | PRN
  Administered 2014-12-26 – 2014-12-27 (×3): 5 mL via ORAL
  Filled 2014-12-26 (×4): qty 5

## 2014-12-26 MED ORDER — BUDESONIDE-FORMOTEROL FUMARATE 160-4.5 MCG/ACT IN AERO
2.0000 | INHALATION_SPRAY | Freq: Two times a day (BID) | RESPIRATORY_TRACT | Status: DC
  Administered 2014-12-26 – 2014-12-28 (×5): 2 via RESPIRATORY_TRACT
  Filled 2014-12-26: qty 6

## 2014-12-26 MED ORDER — ONDANSETRON HCL 4 MG PO TABS: 4.0000 mg | ORAL_TABLET | Freq: Four times a day (QID) | ORAL | Status: DC | PRN

## 2014-12-26 MED ORDER — MAGNESIUM SULFATE 2 GM/50ML IV SOLN
2.0000 g | Freq: Once | INTRAVENOUS | Status: AC
  Administered 2014-12-26: 2 g via INTRAVENOUS
  Filled 2014-12-26: qty 50

## 2014-12-26 MED ORDER — ASPIRIN EC 81 MG PO TBEC
81.0000 mg | DELAYED_RELEASE_TABLET | Freq: Every day | ORAL | Status: DC
  Administered 2014-12-27 – 2014-12-28 (×2): 81 mg via ORAL
  Filled 2014-12-26 (×2): qty 1

## 2014-12-26 MED ORDER — METOPROLOL SUCCINATE ER 25 MG PO TB24
25.0000 mg | ORAL_TABLET | Freq: Every day | ORAL | Status: DC
  Administered 2014-12-26 – 2014-12-28 (×3): 25 mg via ORAL
  Filled 2014-12-26 (×3): qty 1

## 2014-12-26 MED ORDER — ALBUTEROL SULFATE (2.5 MG/3ML) 0.083% IN NEBU
INHALATION_SOLUTION | RESPIRATORY_TRACT | Status: AC
  Administered 2014-12-26: 2.5 mg via RESPIRATORY_TRACT
  Filled 2014-12-26: qty 3

## 2014-12-26 MED ORDER — BUTALBITAL-APAP-CAFFEINE 50-325-40 MG PO TABS
ORAL_TABLET | ORAL | Status: AC
  Administered 2014-12-26: 2 via ORAL
  Filled 2014-12-26: qty 2

## 2014-12-26 MED ORDER — DOCUSATE SODIUM 100 MG PO CAPS
100.0000 mg | ORAL_CAPSULE | Freq: Two times a day (BID) | ORAL | Status: DC
  Administered 2014-12-26 – 2014-12-28 (×5): 100 mg via ORAL
  Filled 2014-12-26 (×5): qty 1

## 2014-12-26 MED ORDER — BENZONATATE 100 MG PO CAPS
100.0000 mg | ORAL_CAPSULE | Freq: Three times a day (TID) | ORAL | Status: DC
  Administered 2014-12-26 – 2014-12-27 (×4): 100 mg via ORAL
  Filled 2014-12-26 (×4): qty 1

## 2014-12-26 MED ORDER — METHYLPREDNISOLONE SODIUM SUCC 125 MG IJ SOLR
125.0000 mg | Freq: Once | INTRAMUSCULAR | Status: AC
  Administered 2014-12-26: 125 mg via INTRAVENOUS
  Filled 2014-12-26: qty 2

## 2014-12-26 MED ORDER — TRIAMCINOLONE ACETONIDE 55 MCG/ACT NA AERO
2.0000 | INHALATION_SPRAY | Freq: Every day | NASAL | Status: DC
  Administered 2014-12-26 – 2014-12-28 (×3): 2 via NASAL
  Filled 2014-12-26: qty 21.6

## 2014-12-26 MED ORDER — LISINOPRIL 10 MG PO TABS
10.0000 mg | ORAL_TABLET | Freq: Every day | ORAL | Status: DC
  Administered 2014-12-26 – 2014-12-28 (×3): 10 mg via ORAL
  Filled 2014-12-26 (×3): qty 1

## 2014-12-26 MED ORDER — TIZANIDINE HCL 4 MG PO TABS
4.0000 mg | ORAL_TABLET | Freq: Three times a day (TID) | ORAL | Status: DC | PRN
  Administered 2014-12-26 – 2014-12-27 (×3): 4 mg via ORAL
  Filled 2014-12-26 (×3): qty 1

## 2014-12-26 MED ORDER — ACETAMINOPHEN 325 MG PO TABS
650.0000 mg | ORAL_TABLET | Freq: Four times a day (QID) | ORAL | Status: DC | PRN
  Administered 2014-12-27: 650 mg via ORAL
  Filled 2014-12-26: qty 2

## 2014-12-26 MED ORDER — MECLIZINE HCL 25 MG PO TABS: 25.0000 mg | ORAL_TABLET | Freq: Three times a day (TID) | ORAL | Status: DC | PRN

## 2014-12-26 MED ORDER — ENOXAPARIN SODIUM 40 MG/0.4ML ~~LOC~~ SOLN
40.0000 mg | SUBCUTANEOUS | Status: DC
  Administered 2014-12-26 – 2014-12-28 (×3): 40 mg via SUBCUTANEOUS
  Filled 2014-12-26 (×3): qty 0.4

## 2014-12-26 MED ORDER — BUTALBITAL-APAP-CAFFEINE 50-325-40 MG PO TABS
2.0000 | ORAL_TABLET | ORAL | Status: AC
  Administered 2014-12-26: 2 via ORAL

## 2014-12-26 MED ORDER — HYDROCHLOROTHIAZIDE 12.5 MG PO CAPS
12.5000 mg | ORAL_CAPSULE | Freq: Every day | ORAL | Status: DC
  Administered 2014-12-26 – 2014-12-28 (×3): 12.5 mg via ORAL
  Filled 2014-12-26 (×3): qty 1

## 2014-12-26 MED ORDER — ALBUTEROL SULFATE (2.5 MG/3ML) 0.083% IN NEBU
2.5000 mg | INHALATION_SOLUTION | Freq: Once | RESPIRATORY_TRACT | Status: AC
  Administered 2014-12-26: 2.5 mg via RESPIRATORY_TRACT

## 2014-12-26 MED ORDER — LEVOFLOXACIN 500 MG PO TABS
500.0000 mg | ORAL_TABLET | Freq: Every day | ORAL | Status: DC
  Administered 2014-12-26 – 2014-12-28 (×3): 500 mg via ORAL
  Filled 2014-12-26 (×3): qty 1

## 2014-12-26 MED ORDER — ACETAMINOPHEN 650 MG RE SUPP: 650.0000 mg | Freq: Four times a day (QID) | RECTAL | Status: DC | PRN

## 2014-12-26 NOTE — ED Notes (Signed)
Pt in Radiology 

## 2014-12-26 NOTE — ED Notes (Signed)
Patient reports history of COPD worsening shortness of breath and cough since Tuesday.

## 2014-12-26 NOTE — H&P (Signed)
[facility] [place] NAME: Andrea Bell    MR#:  [id number]  DATE OF BIRTH:  1956-01-17  DATE OF ADMISSION:  12/26/2014  PRIMARY CARE PHYSICIAN: Marguerita Merles, MD   REQUESTING/REFERRING PHYSICIAN: Dr. Charlesetta Ivory  CHIEF COMPLAINT:   Chief Complaint  Patient presents with  . Shortness of Breath    HISTORY OF PRESENT ILLNESS:  Andrea Bell  is a 59 y.o. female with a known history of COPD on 2 L home oxygen, CK D, hypertension, GERD and chronic low back pain presents to the hospital secondary to worsening breathing and cough going on for almost 5 days now. Patient sees Dr. Vella Kohler for outpatient pulmonology. She was started on home oxygen about 7 months ago. She says her symptoms flareup whenever the weather changes. About 5 days ago when it started to get cold, her cough got worse and and she noticed that she was having exertional dyspnea. Her cough has been nonproductive, denies any sinus congestion or rhinitis symptoms this time. Has been having hot and cold chills, no recorded fever. Some nausea associated with an episode of vomiting present. She's been having occasional chest tightness with her breathing difficulty. This is different from the pain she is also having whenever she coughs. Her symptoms have gotten worse since last night and so she presented to the emergency room. Patient was having significant expiratory wheezing on exam and was given nebs and steroids here in the emergency room. Her troponin is noted to be elevated at 0.14, so she is being admitted for the same.  PAST MEDICAL HISTORY:   Past Medical History  Diagnosis Date  . COPD (chronic obstructive pulmonary disease) (HCC)     2l home oxygen  . CKD (chronic kidney disease)   . GERD (gastroesophageal reflux disease)   . HTN (hypertension)   . Tachycardia   . Anemia   . Chronic back pain   . Osteoporosis   . Osteoarthritis     PAST SURGICAL HISTORY:    Past Surgical History  Procedure Laterality Date  . Tear duct surgery      bilateral    SOCIAL HISTORY:   Social History  Substance Use Topics  . Smoking status: Former Smoker    Quit date: 04/25/2014  . Smokeless tobacco: Not on file  . Alcohol Use: No     Comment: occasional drinks    FAMILY HISTORY:   Family History  Problem Relation Age of Onset  . Breast cancer Maternal Aunt 57  . Breast cancer Maternal Grandmother 76  . Hypertension Mother   . Diabetes Mellitus II Mother   . Cancer - Other Father     brain tumor  . Dementia Mother     DRUG ALLERGIES:   Allergies  Allergen Reactions  . Codeine Nausea Only    REVIEW OF SYSTEMS:   Review of Systems  Constitutional: Positive for chills. Negative for fever, weight loss and malaise/fatigue.  HENT: Negative for ear discharge, ear pain, hearing loss, nosebleeds and tinnitus.   Eyes: Negative for blurred vision, double vision and photophobia.  Respiratory: Positive for cough, shortness of breath and wheezing. Negative for hemoptysis.   Cardiovascular: Positive for chest pain and palpitations. Negative for orthopnea and leg swelling.  Gastrointestinal: Positive for nausea. Negative for heartburn, vomiting, abdominal pain, diarrhea, constipation and melena.  Genitourinary: Negative for dysuria, urgency, frequency and hematuria.  Musculoskeletal: Positive for back pain. Negative for myalgias and neck pain.  Skin:  Negative for rash.  Neurological: Positive for weakness. Negative for dizziness, tingling, tremors, sensory change, speech change, focal weakness and headaches.  Endo/Heme/Allergies: Does not bruise/bleed easily.  Psychiatric/Behavioral: Negative for depression.    MEDICATIONS AT HOME:   Prior to Admission medications   Not on File      VITAL SIGNS:  Blood pressure 121/81, pulse 91, temperature 98.6 F (37 C), temperature source Oral, resp. rate 14, height 5\' 7"  (1.702 m), weight 77.111 kg (170  lb), SpO2 97 %.  PHYSICAL EXAMINATION:   Physical Exam  GENERAL:  59 y.o.-year-old patient lying in the bed with no acute distress.  EYES: Pupils equal, round, reactive to light and accommodation. No scleral icterus. Extraocular muscles intact.  HEENT: Head atraumatic, normocephalic. Oropharynx and nasopharynx clear.  NECK:  Supple, no jugular venous distention. No thyroid enlargement, no tenderness.  LUNGS: Normal breath sounds bilaterally, no rales,rhonchi or crepitation. Scattered expiratory wheezing noted on exam. No use of accessory muscles of respiration.  CARDIOVASCULAR: S1, S2 normal. No murmurs, rubs, or gallops. No chest wall tenderness. ABDOMEN: Soft, nontender, nondistended. Bowel sounds present. No organomegaly or mass.  EXTREMITIES: No pedal edema, cyanosis, or clubbing.  NEUROLOGIC: Cranial nerves II through XII are intact. Muscle strength 5/5 in all extremities. Sensation intact. Gait not checked.  PSYCHIATRIC: The patient is alert and oriented x 3.  SKIN: No obvious rash, lesion, or ulcer.   LABORATORY PANEL:   CBC  Recent Labs Lab 12/26/14 0455  WBC 10.9  HGB 14.1  HCT 42.3  PLT 224   ------------------------------------------------------------------------------------------------------------------  Chemistries   Recent Labs Lab 12/26/14 0455  NA 138  K 3.9  CL 103  CO2 29  GLUCOSE 108*  BUN 17  CREATININE 0.84  CALCIUM 9.2   ------------------------------------------------------------------------------------------------------------------  Cardiac Enzymes  Recent Labs Lab 12/26/14 0455  TROPONINI 0.14*   ------------------------------------------------------------------------------------------------------------------  RADIOLOGY:  Dg Chest 2 View  12/26/2014  CLINICAL DATA:  Dyspnea and cough, onset tonight EXAM: CHEST  2 VIEW COMPARISON:  04/15/2014 FINDINGS: The heart size and mediastinal contours are within normal limits. Both lungs are  clear. The visualized skeletal structures are unremarkable. IMPRESSION: No active cardiopulmonary disease. Electronically Signed   By: Andreas Newport M.D.   On: 12/26/2014 05:04    EKG:   Orders placed or performed in visit on 04/15/14  . EKG 12-Lead    IMPRESSION AND PLAN:   Andrea Bell  is a 59 y.o. female with a known history of COPD on 2 L home oxygen, CK D, hypertension, GERD and chronic low back pain presents to the hospital secondary to worsening breathing and cough going on for almost 5 days now.   1. Acute on chronic COPD exacerbation-likely triggered by seasonal weather changes. -Started on steroids IV, DuoNeb's. -Continue her home inhalers. -Chest x-ray is clear, significant bronchitis symptoms. Started on Levaquin empirically. -Patient has quit smoking about 8 months ago.  2. Elevated troponin-could be demand ischemia from her dyspnea and respiratory issues. -However also describes of some chest tightness different from her chest pain due to cough. -We'll admit to telemetry, recycle troponins. -Continue aspirin and also on Toprol  3. Hypertension-continue Toprol, lisinopril and hydrochlorothiazide  4. Gastroesophageal reflux disease-Protonix while in the hospital -Takes Nexium at home  5. DVT prophylaxis-on Lovenox   All the records are reviewed and case discussed with ED provider. Management plans discussed with the patient, family and they are in agreement.  CODE STATUS: Full code  TOTAL TIME TAKING CARE OF THIS  PATIENT: 50 minutes.    Gladstone Lighter M.D on 12/26/2014 at 7:52 AM  Between 7am to 6pm - Pager - [phone]  After 6pm go to [email or web address] - password [email or web address] [facility]  Office  [phone]  CC: Primary care physician; Marguerita Merles, MD

## 2014-12-26 NOTE — Progress Notes (Signed)
Patient admitted to unit. Oriented to room, call bell, and staff. Bed in lowest position. Fall safety plan reviewed. Full assessment to Epic. Skin assessment verified with Lurlean Horns RN. Telemetry box verification with tele clerk- Box#: [id number]. On chronic O2. No complaints of pain at this time. Will continue to monitor.

## 2014-12-26 NOTE — ED Notes (Signed)
When attempting to draw labs IV had no blood return. IV blew when flushed with NS. Catheter intact upon removal.

## 2014-12-26 NOTE — ED Provider Notes (Signed)
[facility] Emergency Department Provider Note  ____________________________________________  Time seen: Approximately 359 AM  I have reviewed the triage vital signs and the nursing notes.   HISTORY  Chief Complaint Shortness of Breath    HPI Andrea Bell is a 59 y.o. female who comes into the hospital today with shortness of breath, cold symptoms, chest pain and headache. The patient reports that she's been having some difficulty breathing and she thinks her COPD is acting up. The patient reports that she started having these symptoms on Tuesday but has not been getting any better. The patient is taking cough medicine and her regular medicines but nothing also. The patient uses oxygen at home and she is on 2 L. She has not had any fevers but has had a lot of cough and cold symptoms and tightness in her chest. The patient reports that she vomits sometimes after she coughs. The patient denies any abdominal pain but reports that her chest and headache are 10 out of 10 whenever she coughs. The patient does not have any sick contacts. The patient comes in tonight for evaluation.   Past medical history COPD Bronchitis Emphysema Hypertension Bradycardia  There are no active problems to display for this patient.   No past surgical history   No current outpatient prescriptions on file.  Allergies Codeine  Family History  Problem Relation Age of Onset  . Breast cancer Maternal Aunt 57  . Breast cancer Maternal Grandmother 62    Social History Social History  Substance Use Topics  . Smoking status:  patient doesn't smoke   . Smokeless tobacco: Not on file  . Alcohol Use:  drinks occasionally     Review of Systems Constitutional: No fever/chills Eyes: No visual changes. ENT: Scratchy throat Cardiovascular: Chest pain Respiratory:  shortness of breath and cough Gastrointestinal: No abdominal pain.  No nausea, no vomiting.  No diarrhea.  No  constipation. Genitourinary: Negative for dysuria. Musculoskeletal: Negative for back pain. Skin: Negative for rash. Neurological: headache  10-point ROS otherwise negative.  ____________________________________________   PHYSICAL EXAM:  VITAL SIGNS: ED Triage Vitals  Enc Vitals Group     BP 12/26/14 0329 126/85 mmHg     Pulse Rate 12/26/14 0329 97     Resp 12/26/14 0329 22     Temp 12/26/14 0329 98.6 F (37 C)     Temp Source 12/26/14 0329 Oral     SpO2 12/26/14 0329 94 %     Weight 12/26/14 0329 170 lb (77.111 kg)     Height 12/26/14 0329 5\' 7"  (1.702 m)     Head Cir --      Peak Flow --      Pain Score 12/26/14 0331 10     Pain Loc --      Pain Edu? --      Excl. in [place]? --     Constitutional: Alert and oriented. Well appearing and in moderate distress. Eyes: Conjunctivae are normal. PERRL. EOMI. Head: Atraumatic. Nose: No congestion/rhinnorhea. Mouth/Throat: Mucous membranes are moist.  Oropharynx non-erythematous. Cardiovascular: Normal rate, regular rhythm. Grossly normal heart sounds.  Good peripheral circulation. Respiratory: increased respiratory effort.  Moderate retractions decreased breath sounds  Gastrointestinal: Soft and nontender. No distention. Positive bowel sounds Musculoskeletal: No lower extremity tenderness nor edema.   Neurologic:  Normal speech and language. No gross focal neurologic deficits are appreciated. Skin:  Skin is warm, dry and intact. Psychiatric: Mood and affect are normal.   ____________________________________________   LABS (  all labs ordered are listed, but only abnormal results are displayed)  Labs Reviewed  BASIC METABOLIC PANEL - Abnormal; Notable for the following:    Glucose, Bld 108 (*)    All other components within normal limits  TROPONIN I - Abnormal; Notable for the following:    Troponin I 0.14 (*)    All other components within normal limits  BLOOD GAS, VENOUS - Abnormal; Notable for the following:     Bicarbonate 29.9 (*)    Acid-Base Excess 4.8 (*)    All other components within normal limits  CBC   ____________________________________________  EKG  ED ECG REPORT I, Loney Hering, the attending physician, personally viewed and interpreted this ECG.   Date: 12/26/2014  EKG Time: 343  Rate: 87  Rhythm: normal sinus rhythm  Axis: normal  Intervals:right bundle branch block  ST&T Change:flipped t wave V2  ____________________________________________  RADIOLOGY  CXR: No active cardiopulmonary disease ____________________________________________   PROCEDURES  Procedure(s) performed: None  Critical Care performed: No  ____________________________________________   INITIAL IMPRESSION / ASSESSMENT AND PLAN / ED COURSE  Pertinent labs & imaging results that were available during my care of the patient were reviewed by me and considered in my medical decision making (see chart for details).  This is a 59 year old female who comes in today with some shortness of breath cough and chest discomfort. I will give the patient 3 duo nebs and Solu-Medrol. I will do a chest x-ray I will just blood work. I will reassess the patient when she's had some medication.  After the patient received a DuoNeb, Solu-Medrol she said she felt some mild improvement but still had some tight wheezes. I gave her another albuterol treatment as well as magnesium sulfate. The patient was noticed to have a troponin of 0.14 in the emergency department. I will admit the patient to the hospitalist service and give her dose of aspirin. The patient also had a VBG that did not show a severely elevated PCO2. The patient will be admitted for evaluation of her COPD exacerbation and her elevated troponin. ____________________________________________   FINAL CLINICAL IMPRESSION(S) / ED DIAGNOSES  Final diagnoses:  COPD exacerbation ([place])  Chest pain, unspecified chest pain type  Elevated troponin       Loney Hering, MD 12/26/14 [phone]

## 2014-12-27 LAB — CBC
HCT: 41.6 % (ref 35.0–47.0)
Hemoglobin: 13.4 g/dL (ref 12.0–16.0)
MCH: 29.9 pg (ref 26.0–34.0)
MCHC: 32.2 g/dL (ref 32.0–36.0)
MCV: 92.9 fL (ref 80.0–100.0)
PLATELETS: 277 10*3/uL (ref 150–440)
RBC: 4.48 MIL/uL (ref 3.80–5.20)
RDW: 13.2 % (ref 11.5–14.5)
WBC: 13.4 10*3/uL — ABNORMAL HIGH (ref 3.6–11.0)

## 2014-12-27 LAB — GLUCOSE, CAPILLARY
GLUCOSE-CAPILLARY: 135 mg/dL — AB (ref 65–99)
Glucose-Capillary: 178 mg/dL — ABNORMAL HIGH (ref 65–99)

## 2014-12-27 LAB — BASIC METABOLIC PANEL
Anion gap: 4 — ABNORMAL LOW (ref 5–15)
BUN: 25 mg/dL — AB (ref 6–20)
CALCIUM: 9.8 mg/dL (ref 8.9–10.3)
CO2: 29 mmol/L (ref 22–32)
CREATININE: 0.99 mg/dL (ref 0.44–1.00)
Chloride: 104 mmol/L (ref 101–111)
GFR calc non Af Amer: >60 mL/min (ref >60)
GLUCOSE: 206 mg/dL — AB (ref 65–99)
Potassium: 4.4 mmol/L (ref 3.5–5.1)
Sodium: 137 mmol/L (ref 135–145)

## 2014-12-27 MED ORDER — INSULIN ASPART 100 UNIT/ML ~~LOC~~ SOLN
0.0000 [IU] | Freq: Three times a day (TID) | SUBCUTANEOUS | Status: DC
  Administered 2014-12-27: 2 [IU] via SUBCUTANEOUS
  Administered 2014-12-28: 1 [IU] via SUBCUTANEOUS
  Filled 2014-12-27: qty 2
  Filled 2014-12-27: qty 1

## 2014-12-27 MED ORDER — BENZONATATE 100 MG PO CAPS
200.0000 mg | ORAL_CAPSULE | Freq: Three times a day (TID) | ORAL | Status: DC
  Administered 2014-12-27 – 2014-12-28 (×3): 200 mg via ORAL
  Filled 2014-12-27 (×3): qty 2

## 2014-12-27 MED ORDER — GUAIFENESIN-CODEINE 100-10 MG/5ML PO SOLN
10.0000 mL | Freq: Four times a day (QID) | ORAL | Status: DC
  Administered 2014-12-27 – 2014-12-28 (×4): 10 mL via ORAL
  Filled 2014-12-27 (×4): qty 10

## 2014-12-27 MED ORDER — INSULIN ASPART 100 UNIT/ML ~~LOC~~ SOLN: 0.0000 [IU] | Freq: Every day | SUBCUTANEOUS | Status: DC

## 2014-12-27 NOTE — Progress Notes (Signed)
Patient rested quietly most of the day. Cough and associated chest soreness improving with medications. Patient independent - stated she ambulated a lap in the halls today. Visitors at bedside part of the day. No other complaints. Possible discharge tomorrow.

## 2014-12-27 NOTE — Progress Notes (Signed)
[place] [facility] [place] NAME: Andrea Bell    MR#:  [id number]  DATE OF BIRTH:  02/06/1956  SUBJECTIVE:  CHIEF COMPLAINT:   Chief Complaint  Patient presents with  . Shortness of Breath   - Breathing feels much better today. Still has significant cough and also complaining of chest pain from the cough -Troponins are stable  REVIEW OF SYSTEMS:  Review of Systems  Constitutional: Negative for fever and chills.  HENT: Negative for ear discharge and nosebleeds.   Eyes: Negative for blurred vision and double vision.  Respiratory: Positive for cough, shortness of breath and wheezing.   Cardiovascular: Positive for chest pain. Negative for palpitations and leg swelling.  Gastrointestinal: Negative for nausea, vomiting, abdominal pain, diarrhea and constipation.  Genitourinary: Negative for dysuria.  Musculoskeletal: Negative for myalgias.  Neurological: Negative for dizziness, sensory change, speech change, focal weakness, seizures, weakness and headaches.  Psychiatric/Behavioral: Negative for depression.    DRUG ALLERGIES:   Allergies  Allergen Reactions  . Codeine Nausea And Vomiting    VITALS:  Blood pressure 113/57, pulse 66, temperature 97.8 F (36.6 C), temperature source Oral, resp. rate 18, height 5\' 7"  (1.702 m), weight 83.961 kg (185 lb 1.6 oz), SpO2 98 %.  PHYSICAL EXAMINATION:  Physical Exam  GENERAL:  59 y.o.-year-old patient lying in the bed with no acute distress.  EYES: Pupils equal, round, reactive to light and accommodation. No scleral icterus. Extraocular muscles intact.  HEENT: Head atraumatic, normocephalic. Oropharynx and nasopharynx clear.  NECK:  Supple, no jugular venous distention. No thyroid enlargement, no tenderness.  LUNGS: Normal breath sounds bilaterally, scattered wheezing,  no rales,rhonchi or crepitation. No use of accessory muscles of respiration.  CARDIOVASCULAR: S1, S2 normal. No murmurs,  rubs, or gallops.  ABDOMEN: Soft, nontender, nondistended. Bowel sounds present. No organomegaly or mass.  EXTREMITIES: No pedal edema, cyanosis, or clubbing.  NEUROLOGIC: Cranial nerves II through XII are intact. Muscle strength 5/5 in all extremities. Sensation intact. Gait not checked.  PSYCHIATRIC: The patient is alert and oriented x 3.  SKIN: No obvious rash, lesion, or ulcer.    LABORATORY PANEL:   CBC  Recent Labs Lab 12/27/14 0145  WBC 13.4*  HGB 13.4  HCT 41.6  PLT 277   ------------------------------------------------------------------------------------------------------------------  Chemistries   Recent Labs Lab 12/27/14 0145  NA 137  K 4.4  CL 104  CO2 29  GLUCOSE 206*  BUN 25*  CREATININE 0.99  CALCIUM 9.8   ------------------------------------------------------------------------------------------------------------------  Cardiac Enzymes  Recent Labs Lab 12/26/14 1552  TROPONINI <0.03   ------------------------------------------------------------------------------------------------------------------  RADIOLOGY:  Dg Chest 2 View  12/26/2014  CLINICAL DATA:  Dyspnea and cough, onset tonight EXAM: CHEST  2 VIEW COMPARISON:  04/15/2014 FINDINGS: The heart size and mediastinal contours are within normal limits. Both lungs are clear. The visualized skeletal structures are unremarkable. IMPRESSION: No active cardiopulmonary disease. Electronically Signed   By: Andreas Newport M.D.   On: 12/26/2014 05:04    EKG:   Orders placed or performed in visit on 04/15/14  . EKG 12-Lead    ASSESSMENT AND PLAN:   Tamantha Saline is a 59 y.o. female with a known history of COPD on 2 L home oxygen, CKD, hypertension, GERD and chronic low back pain presents to the hospital secondary to worsening breathing and cough going on for almost 5 days now.   1. Acute on chronic COPD exacerbation with bronchitis- likely triggered by seasonal weather changes. -continue on  steroids IV, DuoNeb's. -Continue her home inhalers. -Chest x-ray is clear, significant bronchitis symptoms. on Levaquin empirically. Continue for 5 days -Patient has quit smoking about 8 months ago. - added cough meds  2. Elevated troponin- demand ischemia from her dyspnea and respiratory issues. -stable troponins -Continue aspirin and also on Toprol  3. Hypertension-continue Toprol, lisinopril and hydrochlorothiazide  4. Gastroesophageal reflux disease-Protonix while in the hospital -Takes Nexium at home  5. DVT prophylaxis-on Lovenox   Possible discharge tomorrow  All the records are reviewed and case discussed with Care Management/Social Workerr. Management plans discussed with the patient, family and they are in agreement.  CODE STATUS: Full Code  TOTAL TIME TAKING CARE OF THIS PATIENT: 37 minutes.   POSSIBLE D/C TOMORROW, DEPENDING ON CLINICAL CONDITION.   Carron Jaggi M.D on 12/27/2014 at 2:00 PM  Between 7am to 6pm - Pager - [phone]  After 6pm go to [email or web address] - password [email or web address] [facility]  Office  [phone]  CC: Primary care physician; Marguerita Merles, MD

## 2014-12-27 NOTE — Progress Notes (Signed)
Patient has rested quietly tonight. Minimal complaints of pain treated with PRN pain meds and no signs of discomfort or distress noted. Nursing staff will continue to monitor. Earleen Reaper, RN

## 2014-12-28 LAB — GLUCOSE, CAPILLARY: Glucose-Capillary: 131 mg/dL — ABNORMAL HIGH (ref 65–99)

## 2014-12-28 MED ORDER — LEVOFLOXACIN 500 MG PO TABS: 500.0000 mg | ORAL_TABLET | Freq: Every day | ORAL | Status: DC

## 2014-12-28 MED ORDER — GUAIFENESIN-CODEINE 100-10 MG/5ML PO SOLN: 10.0000 mL | Freq: Four times a day (QID) | ORAL | Status: DC

## 2014-12-28 MED ORDER — METOPROLOL SUCCINATE ER 25 MG PO TB24: 25.0000 mg | ORAL_TABLET | Freq: Every day | ORAL | Status: DC

## 2014-12-28 MED ORDER — TRAMADOL HCL 50 MG PO TABS: 50.0000 mg | ORAL_TABLET | Freq: Four times a day (QID) | ORAL | Status: DC | PRN

## 2014-12-28 MED ORDER — BENZONATATE 200 MG PO CAPS: 200.0000 mg | ORAL_CAPSULE | Freq: Three times a day (TID) | ORAL | Status: DC

## 2014-12-28 NOTE — Progress Notes (Signed)
Patient has rested quietly tonight. No complaints of pain and no signs of discomfort or distress noted. Combination of tessalon and cough syrup with codeine have significantly helped with patient's coughing. Bedtime CBG was 135, so no coverage needed. Nursing staff will continue to monitor. Earleen Reaper, RN

## 2014-12-28 NOTE — Progress Notes (Signed)
Pt is a&o, VSS, NSR on tele, with no complaints of pain or discomfort. Order to D/c pt to home. No acute needs from CM. Discharge instructions and prescriptions given to pt with verbal acknowledgment of understanding. IV and tele removed and pt awaiting ride for d/c.

## 2014-12-28 NOTE — Discharge Summary (Signed)
[facility] NAME: Andrea Bell    MR#:  [id number]  DATE OF BIRTH:  [birth year]  DATE OF ADMISSION:  12/26/2014 ADMITTING PHYSICIAN: Gladstone Lighter, MD  DATE OF DISCHARGE: 12/28/2014  PRIMARY CARE PHYSICIAN: Marguerita Merles, MD    ADMISSION DIAGNOSIS:  Elevated troponin [R79.89] COPD exacerbation (HCC) [J44.1] Chest pain, unspecified chest pain type [R07.9]  DISCHARGE DIAGNOSIS:  Active Problems:   COPD exacerbation ([place])   SECONDARY DIAGNOSIS:   Past Medical History  Diagnosis Date  . COPD (chronic obstructive pulmonary disease) (HCC)     2l home oxygen  . GERD (gastroesophageal reflux disease)   . HTN (hypertension)   . Tachycardia   . Anemia   . Chronic back pain   . Osteoporosis   . Osteoarthritis     HOSPITAL COURSE:   Andrea Bell is a 59 y.o. female with a known history of COPD on 2 L home oxygen, CKD, hypertension, GERD and chronic low back pain presents to the hospital secondary to worsening breathing and cough.   1. Acute on chronic COPD exacerbation with bronchitis- likely triggered by seasonal weather changes. -Much improved now. Change IV steroids to oral prednisone. Continue duo nebs at discharge -Continue her home inhalers. -Chest x-ray is clear, significant bronchitis symptoms. on Levaquin for 5 days -Patient has quit smoking about 8 months ago. - added cough meds with significant relief  2. Elevated troponin- demand ischemia from her dyspnea and respiratory issues. -stable troponins -Continue aspirin and also on Toprol  3. Hypertension-continue Toprol, lisinopril and hydrochlorothiazide  4. Gastroesophageal reflux disease-continue PPI -Takes Nexium at home  Discharge today  DISCHARGE CONDITIONS:   Stable  CONSULTS OBTAINED:     DRUG ALLERGIES:   Allergies  Allergen Reactions  . Codeine Nausea And Vomiting    DISCHARGE MEDICATIONS:   Current Discharge Medication  List    START taking these medications   Details  benzonatate (TESSALON) 200 MG capsule Take 1 capsule (200 mg total) by mouth 3 (three) times daily. Qty: 20 capsule, Refills: 0    guaiFENesin-codeine 100-10 MG/5ML syrup Take 10 mLs by mouth every 6 (six) hours. Qty: 118 mL, Refills: 0    levofloxacin (LEVAQUIN) 500 MG tablet Take 1 tablet (500 mg total) by mouth daily. Qty: 4 tablet, Refills: 0    traMADol (ULTRAM) 50 MG tablet Take 1 tablet (50 mg total) by mouth every 6 (six) hours as needed. Qty: 20 tablet, Refills: 0      CONTINUE these medications which have CHANGED   Details  metoprolol succinate (TOPROL-XL) 25 MG 24 hr tablet Take 1 tablet (25 mg total) by mouth daily. Qty: 30 tablet, Refills: 2      CONTINUE these medications which have NOT CHANGED   Details  albuterol (PROAIR HFA) 108 (90 BASE) MCG/ACT inhaler Inhale 2 puffs into the lungs every 6 (six) hours as needed. For wheezing.    aspirin EC 81 MG tablet Take 81 mg by mouth daily.    budesonide-formoterol (SYMBICORT) 160-4.5 MCG/ACT inhaler Inhale 2 puffs into the lungs 2 (two) times daily.    esomeprazole (NEXIUM) 40 MG capsule Take 40 mg by mouth daily.    ipratropium-albuterol (DUONEB) 0.5-2.5 (3) MG/3ML SOLN Inhale 3 mLs into the lungs 4 (four) times daily as needed. For wheezing.    ketorolac (TORADOL) 10 MG tablet Take 10 mg by mouth every 6 (six) hours as needed. For pain.    lisinopril-hydrochlorothiazide (PRINZIDE,ZESTORETIC) 10-12.5 MG  tablet Take 1 tablet by mouth daily.    loratadine (CLARITIN) 10 MG tablet Take 10 mg by mouth daily.    meclizine (ANTIVERT) 25 MG tablet Take 25 mg by mouth 3 (three) times daily as needed. For dizziness.    Olopatadine HCl 0.2 % SOLN Apply 1 drop to eye daily.    tiotropium (SPIRIVA) 18 MCG inhalation capsule Place 18 mcg into inhaler and inhale daily.    tiZANidine (ZANAFLEX) 4 MG tablet Take 4 mg by mouth 3 (three) times daily as needed. For muscle spasms.     traZODone (DESYREL) 100 MG tablet Take 100 mg by mouth at bedtime.    triamcinolone (NASACORT) 55 MCG/ACT AERO nasal inhaler Place 2 sprays into the nose daily.      STOP taking these medications     HYDROcodone-acetaminophen (NORCO/VICODIN) 5-325 MG tablet      meloxicam (MOBIC) 7.5 MG tablet          DISCHARGE INSTRUCTIONS:   1. PCP follow-up in 1-2 weeks  If you experience worsening of your admission symptoms, develop shortness of breath, life threatening emergency, suicidal or homicidal thoughts you must seek medical attention immediately by calling 911 or calling your MD immediately  if symptoms less severe.  You Must read complete instructions/literature along with all the possible adverse reactions/side effects for all the Medicines you take and that have been prescribed to you. Take any new Medicines after you have completely understood and accept all the possible adverse reactions/side effects.   Please note  You were cared for by a hospitalist during your hospital stay. If you have any questions about your discharge medications or the care you received while you were in the hospital after you are discharged, you can call the unit and asked to speak with the hospitalist on call if the hospitalist that took care of you is not available. Once you are discharged, your primary care physician will handle any further medical issues. Please note that NO REFILLS for any discharge medications will be authorized once you are discharged, as it is imperative that you return to your primary care physician (or establish a relationship with a primary care physician if you do not have one) for your aftercare needs so that they can reassess your need for medications and monitor your lab values.    Today   CHIEF COMPLAINT:   Chief Complaint  Patient presents with  . Shortness of Breath    VITAL SIGNS:  Blood pressure 120/69, pulse 78, temperature 97.9 F (36.6 C), temperature  source Oral, resp. rate 16, height 5\' 7"  (1.702 m), weight 83.961 kg (185 lb 1.6 oz), SpO2 99 %.  I/O:   Intake/Output Summary (Last 24 hours) at 12/28/14 1001 Last data filed at 12/27/14 2327  Gross per 24 hour  Intake    240 ml  Output   1100 ml  Net   -860 ml    PHYSICAL EXAMINATION:   Physical Exam  GENERAL: 59 y.o.-year-old patient lying in the bed with no acute distress.  EYES: Pupils equal, round, reactive to light and accommodation. No scleral icterus. Extraocular muscles intact.  HEENT: Head atraumatic, normocephalic. Oropharynx and nasopharynx clear.  NECK: Supple, no jugular venous distention. No thyroid enlargement, no tenderness.  LUNGS: Normal breath sounds bilaterally, scattered wheezing, no rales,rhonchi or crepitation. No use of accessory muscles of respiration.  CARDIOVASCULAR: S1, S2 normal. No murmurs, rubs, or gallops.  ABDOMEN: Soft, nontender, nondistended. Bowel sounds present. No organomegaly or mass.  EXTREMITIES: No pedal edema, cyanosis, or clubbing.  NEUROLOGIC: Cranial nerves II through XII are intact. Muscle strength 5/5 in all extremities. Sensation intact. Gait not checked.  PSYCHIATRIC: The patient is alert and oriented x 3.  SKIN: No obvious rash, lesion, or ulcer.   DATA REVIEW:   CBC  Recent Labs Lab 12/27/14 0145  WBC 13.4*  HGB 13.4  HCT 41.6  PLT 277    Chemistries   Recent Labs Lab 12/27/14 0145  NA 137  K 4.4  CL 104  CO2 29  GLUCOSE 206*  BUN 25*  CREATININE 0.99  CALCIUM 9.8    Cardiac Enzymes  Recent Labs Lab 12/26/14 1552  TROPONINI <0.03    Microbiology Results  Results for orders placed or performed in visit on 02/05/13  Culture, blood (single)     Status: None   Collection Time: 02/05/13  7:17 AM  Result Value Ref Range Status   Micro Text Report   Final       COMMENT                   NO GROWTH AEROBICALLY/ANAEROBICALLY IN 5 DAYS   ANTIBIOTIC                                                       Culture, blood (single)     Status: None   Collection Time: 02/05/13  7:38 AM  Result Value Ref Range Status   Micro Text Report   Final       COMMENT                   NO GROWTH AEROBICALLY/ANAEROBICALLY IN 5 DAYS   ANTIBIOTIC                                                      Influenza A&B Antigens [facility])     Status: None   Collection Time: 02/06/13  7:20 AM  Result Value Ref Range Status   Micro Text Report   Final       COMMENT                   NEGATIVE FOR INFLUENZA A (ANTIGEN ABSENT)   COMMENT                   NEGATIVE FOR INFLUENZA B (ANTIGEN ABSENT)   ANTIBIOTIC                                                        RADIOLOGY:  No results found.  EKG:   Orders placed or performed in visit on 04/15/14  . EKG 12-Lead      Management plans discussed with the patient, family and they are in agreement.  CODE STATUS:     Code Status Orders        Start     Ordered   12/26/14 0940  Full code   Continuous     12/26/14 0939  TOTAL TIME TAKING CARE OF THIS PATIENT: 37 minutes.    Gladstone Lighter M.D on 12/28/2014 at 10:01 AM  Between 7am to 6pm - Pager - [phone]  After 6pm go to [email or web address] - password [email or web address] [facility]  Office  [phone]  CC: Primary care physician; Marguerita Merles, MD

## 2015-01-16 ENCOUNTER — Encounter: Payer: Self-pay | Admitting: Emergency Medicine

## 2015-01-16 ENCOUNTER — Inpatient Hospital Stay
Admission: EM | Admit: 2015-01-16 | Discharge: 2015-01-24 | DRG: 191 | Disposition: A | Discharge status: Home / Self Care | Payer: Medicaid Other | Attending: Internal Medicine | Admitting: Internal Medicine

## 2015-01-16 ENCOUNTER — Emergency Department: Payer: Medicaid Other

## 2015-01-16 DIAGNOSIS — I248 Other forms of acute ischemic heart disease: Secondary | ICD-10-CM | POA: Diagnosis present

## 2015-01-16 DIAGNOSIS — J441 Chronic obstructive pulmonary disease with (acute) exacerbation: Secondary | ICD-10-CM | POA: Diagnosis present

## 2015-01-16 DIAGNOSIS — Z8249 Family history of ischemic heart disease and other diseases of the circulatory system: Secondary | ICD-10-CM

## 2015-01-16 DIAGNOSIS — I10 Essential (primary) hypertension: Secondary | ICD-10-CM | POA: Diagnosis present

## 2015-01-16 DIAGNOSIS — R000 Tachycardia, unspecified: Secondary | ICD-10-CM | POA: Diagnosis present

## 2015-01-16 DIAGNOSIS — F17200 Nicotine dependence, unspecified, uncomplicated: Secondary | ICD-10-CM | POA: Diagnosis present

## 2015-01-16 DIAGNOSIS — Z9119 Patient's noncompliance with other medical treatment and regimen: Secondary | ICD-10-CM | POA: Diagnosis not present

## 2015-01-16 DIAGNOSIS — K5900 Constipation, unspecified: Secondary | ICD-10-CM | POA: Diagnosis present

## 2015-01-16 DIAGNOSIS — Z833 Family history of diabetes mellitus: Secondary | ICD-10-CM | POA: Diagnosis not present

## 2015-01-16 DIAGNOSIS — Z792 Long term (current) use of antibiotics: Secondary | ICD-10-CM | POA: Diagnosis not present

## 2015-01-16 DIAGNOSIS — Z9981 Dependence on supplemental oxygen: Secondary | ICD-10-CM

## 2015-01-16 DIAGNOSIS — M1990 Unspecified osteoarthritis, unspecified site: Secondary | ICD-10-CM | POA: Diagnosis present

## 2015-01-16 DIAGNOSIS — M810 Age-related osteoporosis without current pathological fracture: Secondary | ICD-10-CM | POA: Diagnosis present

## 2015-01-16 DIAGNOSIS — R0781 Pleurodynia: Secondary | ICD-10-CM | POA: Diagnosis present

## 2015-01-16 DIAGNOSIS — R0602 Shortness of breath: Secondary | ICD-10-CM

## 2015-01-16 DIAGNOSIS — Z7982 Long term (current) use of aspirin: Secondary | ICD-10-CM | POA: Diagnosis not present

## 2015-01-16 DIAGNOSIS — R079 Chest pain, unspecified: Secondary | ICD-10-CM | POA: Diagnosis not present

## 2015-01-16 DIAGNOSIS — G4733 Obstructive sleep apnea (adult) (pediatric): Secondary | ICD-10-CM | POA: Diagnosis present

## 2015-01-16 DIAGNOSIS — K219 Gastro-esophageal reflux disease without esophagitis: Secondary | ICD-10-CM | POA: Diagnosis present

## 2015-01-16 DIAGNOSIS — J9610 Chronic respiratory failure, unspecified whether with hypoxia or hypercapnia: Secondary | ICD-10-CM | POA: Diagnosis present

## 2015-01-16 DIAGNOSIS — J449 Chronic obstructive pulmonary disease, unspecified: Secondary | ICD-10-CM | POA: Diagnosis present

## 2015-01-16 DIAGNOSIS — Z803 Family history of malignant neoplasm of breast: Secondary | ICD-10-CM | POA: Diagnosis not present

## 2015-01-16 LAB — CBC
HEMATOCRIT: 42.9 % (ref 35.0–47.0)
HEMOGLOBIN: 14.2 g/dL (ref 12.0–16.0)
MCH: 30.1 pg (ref 26.0–34.0)
MCHC: 33 g/dL (ref 32.0–36.0)
MCV: 91.3 fL (ref 80.0–100.0)
Platelets: 261 10*3/uL (ref 150–440)
RBC: 4.7 MIL/uL (ref 3.80–5.20)
RDW: 13.6 % (ref 11.5–14.5)
WBC: 11.1 10*3/uL — AB (ref 3.6–11.0)

## 2015-01-16 LAB — COMPREHENSIVE METABOLIC PANEL
ALBUMIN: 4.2 g/dL (ref 3.5–5.0)
ALK PHOS: 72 U/L (ref 38–126)
ALT: 22 U/L (ref 14–54)
ANION GAP: 14 (ref 5–15)
AST: 29 U/L (ref 15–41)
BILIRUBIN TOTAL: 0.6 mg/dL (ref 0.3–1.2)
BUN: 22 mg/dL — ABNORMAL HIGH (ref 6–20)
CO2: 27 mmol/L (ref 22–32)
Calcium: 9.5 mg/dL (ref 8.9–10.3)
Chloride: 105 mmol/L (ref 101–111)
Creatinine, Ser: 0.91 mg/dL (ref 0.44–1.00)
GFR calc non Af Amer: >60 mL/min (ref >60)
Glucose, Bld: 109 mg/dL — ABNORMAL HIGH (ref 65–99)
Potassium: 4.5 mmol/L (ref 3.5–5.1)
SODIUM: 146 mmol/L — AB (ref 135–145)
Total Protein: 7.8 g/dL (ref 6.5–8.1)

## 2015-01-16 LAB — TROPONIN I: Troponin I: <0.03 ng/mL (ref <0.031)

## 2015-01-16 MED ORDER — ALPRAZOLAM 0.25 MG PO TABS
0.2500 mg | ORAL_TABLET | Freq: Two times a day (BID) | ORAL | Status: DC | PRN
  Administered 2015-01-21: 0.25 mg via ORAL
  Filled 2015-01-16: qty 1

## 2015-01-16 MED ORDER — AZITHROMYCIN 500 MG IV SOLR
500.0000 mg | INTRAVENOUS | Status: AC
  Administered 2015-01-16 – 2015-01-20 (×5): 500 mg via INTRAVENOUS
  Filled 2015-01-16 (×5): qty 500

## 2015-01-16 MED ORDER — MORPHINE SULFATE (PF) 2 MG/ML IV SOLN
2.0000 mg | Freq: Once | INTRAVENOUS | Status: AC
  Administered 2015-01-16: 2 mg via INTRAVENOUS
  Filled 2015-01-16: qty 1

## 2015-01-16 MED ORDER — ONDANSETRON HCL 4 MG PO TABS: 4.0000 mg | ORAL_TABLET | Freq: Four times a day (QID) | ORAL | Status: DC | PRN

## 2015-01-16 MED ORDER — ACETAMINOPHEN 650 MG RE SUPP: 650.0000 mg | Freq: Four times a day (QID) | RECTAL | Status: DC | PRN

## 2015-01-16 MED ORDER — TRIAMCINOLONE ACETONIDE 55 MCG/ACT NA AERO
2.0000 | INHALATION_SPRAY | Freq: Every day | NASAL | Status: DC
  Administered 2015-01-16 – 2015-01-24 (×9): 2 via NASAL
  Filled 2015-01-16: qty 21.6

## 2015-01-16 MED ORDER — ALBUTEROL SULFATE (2.5 MG/3ML) 0.083% IN NEBU
2.5000 mg | INHALATION_SOLUTION | Freq: Once | RESPIRATORY_TRACT | Status: AC
  Administered 2015-01-16: 2.5 mg via RESPIRATORY_TRACT
  Filled 2015-01-16: qty 3

## 2015-01-16 MED ORDER — PANTOPRAZOLE SODIUM 40 MG PO TBEC
40.0000 mg | DELAYED_RELEASE_TABLET | Freq: Every day | ORAL | Status: DC
  Administered 2015-01-16 – 2015-01-24 (×9): 40 mg via ORAL
  Filled 2015-01-16 (×9): qty 1

## 2015-01-16 MED ORDER — SODIUM CHLORIDE 0.9 % IJ SOLN
3.0000 mL | Freq: Two times a day (BID) | INTRAMUSCULAR | Status: DC
  Administered 2015-01-16 – 2015-01-24 (×17): 3 mL via INTRAVENOUS

## 2015-01-16 MED ORDER — ENOXAPARIN SODIUM 40 MG/0.4ML ~~LOC~~ SOLN
40.0000 mg | SUBCUTANEOUS | Status: DC
  Administered 2015-01-16 – 2015-01-23 (×8): 40 mg via SUBCUTANEOUS
  Filled 2015-01-16 (×8): qty 0.4

## 2015-01-16 MED ORDER — METHYLPREDNISOLONE SODIUM SUCC 125 MG IJ SOLR
125.0000 mg | Freq: Once | INTRAMUSCULAR | Status: AC
  Administered 2015-01-16: 125 mg via INTRAVENOUS
  Filled 2015-01-16: qty 2

## 2015-01-16 MED ORDER — GUAIFENESIN-CODEINE 100-10 MG/5ML PO SOLN
ORAL | Status: AC
  Administered 2015-01-16: 10 mL via ORAL
  Filled 2015-01-16: qty 10

## 2015-01-16 MED ORDER — TIOTROPIUM BROMIDE MONOHYDRATE 18 MCG IN CAPS
18.0000 ug | ORAL_CAPSULE | Freq: Every day | RESPIRATORY_TRACT | Status: DC
  Administered 2015-01-16 – 2015-01-24 (×9): 18 ug via RESPIRATORY_TRACT
  Filled 2015-01-16 (×2): qty 5

## 2015-01-16 MED ORDER — HYDROCODONE-ACETAMINOPHEN 5-325 MG PO TABS
ORAL_TABLET | ORAL | Status: AC
  Administered 2015-01-16: 1 via ORAL
  Filled 2015-01-16: qty 1

## 2015-01-16 MED ORDER — GUAIFENESIN-CODEINE 100-10 MG/5ML PO SOLN
10.0000 mL | Freq: Four times a day (QID) | ORAL | Status: DC
  Administered 2015-01-16 – 2015-01-24 (×32): 10 mL via ORAL
  Filled 2015-01-16 (×31): qty 10

## 2015-01-16 MED ORDER — ALUM & MAG HYDROXIDE-SIMETH 200-200-20 MG/5ML PO SUSP: 30.0000 mL | Freq: Four times a day (QID) | ORAL | Status: DC | PRN

## 2015-01-16 MED ORDER — LISINOPRIL-HYDROCHLOROTHIAZIDE 10-12.5 MG PO TABS: 1.0000 | ORAL_TABLET | Freq: Every day | ORAL | Status: DC

## 2015-01-16 MED ORDER — BUDESONIDE-FORMOTEROL FUMARATE 160-4.5 MCG/ACT IN AERO
2.0000 | INHALATION_SPRAY | Freq: Two times a day (BID) | RESPIRATORY_TRACT | Status: DC
  Administered 2015-01-16 – 2015-01-24 (×17): 2 via RESPIRATORY_TRACT
  Filled 2015-01-16: qty 6

## 2015-01-16 MED ORDER — TRAMADOL HCL 50 MG PO TABS
50.0000 mg | ORAL_TABLET | Freq: Four times a day (QID) | ORAL | Status: DC | PRN
Start: 2015-01-16 — End: 2015-01-24
  Administered 2015-01-19 – 2015-01-24 (×17): 50 mg via ORAL
  Filled 2015-01-16 (×18): qty 1

## 2015-01-16 MED ORDER — TRAZODONE HCL 100 MG PO TABS
100.0000 mg | ORAL_TABLET | Freq: Every day | ORAL | Status: DC
  Administered 2015-01-16 – 2015-01-23 (×8): 100 mg via ORAL
  Filled 2015-01-16 (×8): qty 1

## 2015-01-16 MED ORDER — SENNOSIDES-DOCUSATE SODIUM 8.6-50 MG PO TABS
1.0000 | ORAL_TABLET | Freq: Every evening | ORAL | Status: DC | PRN
  Administered 2015-01-20: 1 via ORAL

## 2015-01-16 MED ORDER — HYDROCODONE-ACETAMINOPHEN 5-325 MG PO TABS
1.0000 | ORAL_TABLET | ORAL | Status: DC | PRN
  Administered 2015-01-16 (×2): 1 via ORAL
  Administered 2015-01-17 (×2): 2 via ORAL
  Administered 2015-01-17: 1 via ORAL
  Administered 2015-01-18 (×3): 2 via ORAL
  Filled 2015-01-16 (×2): qty 1
  Filled 2015-01-16 (×5): qty 2

## 2015-01-16 MED ORDER — BENZONATATE 100 MG PO CAPS
200.0000 mg | ORAL_CAPSULE | Freq: Three times a day (TID) | ORAL | Status: DC
  Administered 2015-01-16 – 2015-01-24 (×24): 200 mg via ORAL
  Filled 2015-01-16 (×24): qty 2

## 2015-01-16 MED ORDER — IPRATROPIUM-ALBUTEROL 0.5-2.5 (3) MG/3ML IN SOLN
3.0000 mL | Freq: Once | RESPIRATORY_TRACT | Status: AC
  Administered 2015-01-16: 3 mL via RESPIRATORY_TRACT
  Filled 2015-01-16: qty 3

## 2015-01-16 MED ORDER — ACETAMINOPHEN 325 MG PO TABS
650.0000 mg | ORAL_TABLET | Freq: Four times a day (QID) | ORAL | Status: DC | PRN
  Administered 2015-01-18 – 2015-01-19 (×2): 650 mg via ORAL
  Filled 2015-01-16 (×2): qty 2

## 2015-01-16 MED ORDER — IPRATROPIUM-ALBUTEROL 0.5-2.5 (3) MG/3ML IN SOLN
3.0000 mL | RESPIRATORY_TRACT | Status: DC
  Administered 2015-01-16 – 2015-01-23 (×40): 3 mL via RESPIRATORY_TRACT
  Filled 2015-01-16 (×39): qty 3

## 2015-01-16 MED ORDER — BISACODYL 5 MG PO TBEC: 5.0000 mg | DELAYED_RELEASE_TABLET | Freq: Every day | ORAL | Status: DC | PRN

## 2015-01-16 MED ORDER — ONDANSETRON HCL 4 MG/2ML IJ SOLN
4.0000 mg | Freq: Four times a day (QID) | INTRAMUSCULAR | Status: DC | PRN
Start: 2015-01-16 — End: 2015-01-24

## 2015-01-16 MED ORDER — METOPROLOL SUCCINATE ER 25 MG PO TB24
25.0000 mg | ORAL_TABLET | Freq: Every day | ORAL | Status: DC
  Administered 2015-01-16 – 2015-01-24 (×9): 25 mg via ORAL
  Filled 2015-01-16 (×9): qty 1

## 2015-01-16 MED ORDER — METHYLPREDNISOLONE SODIUM SUCC 125 MG IJ SOLR
60.0000 mg | Freq: Three times a day (TID) | INTRAMUSCULAR | Status: DC
  Administered 2015-01-16 – 2015-01-23 (×21): 60 mg via INTRAVENOUS
  Filled 2015-01-16 (×21): qty 2

## 2015-01-16 MED ORDER — LORATADINE 10 MG PO TABS
10.0000 mg | ORAL_TABLET | Freq: Every day | ORAL | Status: DC
  Administered 2015-01-16 – 2015-01-24 (×9): 10 mg via ORAL
  Filled 2015-01-16 (×9): qty 1

## 2015-01-16 MED ORDER — LISINOPRIL 10 MG PO TABS
10.0000 mg | ORAL_TABLET | Freq: Every day | ORAL | Status: DC
Start: 2015-01-16 — End: 2015-01-24
  Administered 2015-01-16 – 2015-01-24 (×9): 10 mg via ORAL
  Filled 2015-01-16 (×9): qty 1

## 2015-01-16 MED ORDER — ASPIRIN EC 81 MG PO TBEC
81.0000 mg | DELAYED_RELEASE_TABLET | Freq: Every day | ORAL | Status: DC
  Administered 2015-01-16 – 2015-01-24 (×9): 81 mg via ORAL
  Filled 2015-01-16 (×9): qty 1

## 2015-01-16 MED ORDER — HYDROCHLOROTHIAZIDE 12.5 MG PO CAPS
12.5000 mg | ORAL_CAPSULE | Freq: Every day | ORAL | Status: DC
  Administered 2015-01-16 – 2015-01-24 (×9): 12.5 mg via ORAL
  Filled 2015-01-16 (×9): qty 1

## 2015-01-16 NOTE — ED Notes (Signed)
Increased SOB x 3 days, speaking 3 word sentences. Using inhaler without relief.

## 2015-01-16 NOTE — ED Provider Notes (Signed)
[facility] Emergency Department Provider Note  ____________________________________________  Time seen: On arrival, via EMS  I have reviewed the triage vital signs and the nursing notes.   HISTORY  Chief Complaint Shortness of Breath    HPI Andrea Bell is a 59 y.o. female who presents with complaints of shortness of breath. She reported this breathing difficulty started 3-4 days ago and she tried to make it through the holidays. She has a history of COPD and she believes this is was causing her to feel so short of breath. She feels chest tightness but denies fevers chills or cough. No lower extremity swelling. No recent travel. She was recently in the hospital for COPD exacerbation. She has tried nebulizers at home without improvement     Past Medical History  Diagnosis Date  . COPD (chronic obstructive pulmonary disease) (HCC)     2l home oxygen  . GERD (gastroesophageal reflux disease)   . HTN (hypertension)   . Tachycardia   . Anemia   . Chronic back pain   . Osteoporosis   . Osteoarthritis     Patient Active Problem List   Diagnosis Date Noted  . COPD exacerbation ([place]) 12/26/2014    Past Surgical History  Procedure Laterality Date  . Tear duct surgery      bilateral    Current Outpatient Rx  Name  Route  Sig  Dispense  Refill  . albuterol (PROAIR HFA) 108 (90 BASE) MCG/ACT inhaler   Inhalation   Inhale 2 puffs into the lungs every 6 (six) hours as needed. For wheezing.         Marland Kitchen aspirin EC 81 MG tablet   Oral   Take 81 mg by mouth daily.         . benzonatate (TESSALON) 200 MG capsule   Oral   Take 1 capsule (200 mg total) by mouth 3 (three) times daily.   20 capsule   0   . budesonide-formoterol (SYMBICORT) 160-4.5 MCG/ACT inhaler   Inhalation   Inhale 2 puffs into the lungs 2 (two) times daily.         Marland Kitchen esomeprazole (NEXIUM) 40 MG capsule   Oral   Take 40 mg by mouth daily.         Marland Kitchen guaiFENesin-codeine  100-10 MG/5ML syrup   Oral   Take 10 mLs by mouth every 6 (six) hours.   118 mL   0   . ipratropium-albuterol (DUONEB) 0.5-2.5 (3) MG/3ML SOLN   Inhalation   Inhale 3 mLs into the lungs 4 (four) times daily as needed. For wheezing.         Marland Kitchen ketorolac (TORADOL) 10 MG tablet   Oral   Take 10 mg by mouth every 6 (six) hours as needed. For pain.         Marland Kitchen levofloxacin (LEVAQUIN) 500 MG tablet   Oral   Take 1 tablet (500 mg total) by mouth daily.   4 tablet   0   . lisinopril-hydrochlorothiazide (PRINZIDE,ZESTORETIC) 10-12.5 MG tablet   Oral   Take 1 tablet by mouth daily.         Marland Kitchen loratadine (CLARITIN) 10 MG tablet   Oral   Take 10 mg by mouth daily.         . meclizine (ANTIVERT) 25 MG tablet   Oral   Take 25 mg by mouth 3 (three) times daily as needed. For dizziness.         . metoprolol succinate (  TOPROL-XL) 25 MG 24 hr tablet   Oral   Take 1 tablet (25 mg total) by mouth daily.   30 tablet   2   . Olopatadine HCl 0.2 % SOLN   Ophthalmic   Apply 1 drop to eye daily.         Marland Kitchen tiotropium (SPIRIVA) 18 MCG inhalation capsule   Inhalation   Place 18 mcg into inhaler and inhale daily.         Marland Kitchen tiZANidine (ZANAFLEX) 4 MG tablet   Oral   Take 4 mg by mouth 3 (three) times daily as needed. For muscle spasms.         . traMADol (ULTRAM) 50 MG tablet   Oral   Take 1 tablet (50 mg total) by mouth every 6 (six) hours as needed.   20 tablet   0   . traZODone (DESYREL) 100 MG tablet   Oral   Take 100 mg by mouth at bedtime.         . triamcinolone (NASACORT) 55 MCG/ACT AERO nasal inhaler   Nasal   Place 2 sprays into the nose daily.           Allergies Codeine  Family History  Problem Relation Age of Onset  . Breast cancer Maternal Aunt 57  . Breast cancer Maternal Grandmother 56  . Hypertension Mother   . Diabetes Mellitus II Mother   . Cancer - Other Father     brain tumor  . Dementia Mother     Social History Social History   Substance Use Topics  . Smoking status: Former Smoker    Quit date: 04/25/2014  . Smokeless tobacco: None  . Alcohol Use: 0.0 oz/week    0 Standard drinks or equivalent per week     Comment: occasional drinks    Review of Systems  Constitutional: Negative for fever. Eyes: Negative for discharge ENT: Negative for sore throat Cardiovascular: Positive for chest tightness Respiratory: Positive for shortness of breath. No cough Gastrointestinal: Negative for abdominal pain, vomiting and diarrhea. Genitourinary: Negative for dysuria. Musculoskeletal: Negative for back pain. Skin: Negative for rash. Neurological: Negative for headaches  Psychiatric: Mild anxiety    ____________________________________________   PHYSICAL EXAM:  VITAL SIGNS: ED Triage Vitals  Enc Vitals Group     BP 01/16/15 0936 133/73 mmHg     Pulse Rate 01/16/15 0936 119     Resp 01/16/15 0936 24     Temp 01/16/15 0936 98.5 F (36.9 C)     Temp Source 01/16/15 0936 Oral     SpO2 01/16/15 0936 94 %     Weight 01/16/15 0936 175 lb (79.379 kg)     Height 01/16/15 0936 5\' 7"  (1.702 m)     Head Cir --      Peak Flow --      Pain Score 01/16/15 0938 10     Pain Loc --      Pain Edu? --      Excl. in [place]? --      Constitutional: Alert and oriented. Struggling to breathe Eyes: Conjunctivae are normal.  ENT   Head: Normocephalic and atraumatic.   Mouth/Throat: Mucous membranes are moist. Cardiovascular: Tachycardia, regular rhythm. Normal and symmetric distal pulses are present in all extremities. No murmurs, rubs, or gallops. Respiratory: Positive tachypnea. Diffuse wheezing Gastrointestinal: Soft and non-tender in all quadrants. No distention. There is no CVA tenderness. Genitourinary: deferred Musculoskeletal: Nontender with normal range of motion in all extremities. No lower  extremity tenderness nor edema. Neurologic:  Normal speech and language. No gross focal neurologic deficits are  appreciated. Skin:  Skin is warm, dry and intact. No rash noted. Psychiatric: Mood and affect are normal. Patient exhibits appropriate insight and judgment.  ____________________________________________    LABS (pertinent positives/negatives)  Labs Reviewed  CBC - Abnormal; Notable for the following:    WBC 11.1 (*)    All other components within normal limits  COMPREHENSIVE METABOLIC PANEL  TROPONIN I    ____________________________________________   EKG  ED ECG REPORT I, Lavonia Drafts, the attending physician, personally viewed and interpreted this ECG.   Date: 01/16/2015  EKG Time: 9:44 AM  Rate: 116  Rhythm: sinus tachycardia  Axis: Left axis deviation  Intervals:nonspecific intraventricular conduction delay  ST&T Change: Nonspecific   ____________________________________________    RADIOLOGY I have personally reviewed any xrays that were ordered on this patient: Chest x-ray unremarkable  ____________________________________________   PROCEDURES  Procedure(s) performed: none  Critical Care performed: none  ____________________________________________   INITIAL IMPRESSION / ASSESSMENT AND PLAN / ED COURSE  Pertinent labs & imaging results that were available during my care of the patient were reviewed by me and considered in my medical decision making (see chart for details).  Patient hypoxic with tachycardia and diffuse wheezing consistent with severe COPD exacerbation. We will give nebs, IV slight Medrol, check labs and chest x-ray and EKG and reevaluate  Patient is no longer hypoxic but continues to be tachycardic and still wheezing diffusely. She will require admission for further management.  ____________________________________________   FINAL CLINICAL IMPRESSION(S) / ED DIAGNOSES  Final diagnoses:  COPD exacerbation ([place])     Lavonia Drafts, MD 01/16/15 1055

## 2015-01-16 NOTE — H&P (Addendum)
[facility] [place] NAME: Andrea Bell    MR#:  [id number]  DATE OF BIRTH:  29-Feb-1955  DATE OF ADMISSION:  01/16/2015  PRIMARY CARE PHYSICIAN: Marguerita Merles, MD   REQUESTING/REFERRING PHYSICIAN: Dr Corky Downs  CHIEF COMPLAINT:  Shortness of breath HISTORY OF PRESENT ILLNESS:  Andrea Bell  is a 59 y.o. female with a known history of chronic respiratory failure and 2 L of oxygen, sinus tachycardia and essential hypertension who presents to the left complaint. Patient reports since Thursday night. She's had increasing dryness of breath, wheezing, not productive cough and he thinks vision. She has also felt slightly feverish. She denies sick contacts. Patient was recently hospitalized for similar reason earlier this month.  PAST MEDICAL HISTORY:   Past Medical History  Diagnosis Date  . COPD (chronic obstructive pulmonary disease) (HCC)     2l home oxygen  . GERD (gastroesophageal reflux disease)   . HTN (hypertension)   . Tachycardia   . Anemia   . Chronic back pain   . Osteoporosis   . Osteoarthritis     PAST SURGICAL HISTORY:   Past Surgical History  Procedure Laterality Date  . Tear duct surgery      bilateral    SOCIAL HISTORY:   Social History  Substance Use Topics  . Smoking status: Former Smoker    Quit date: 04/25/2014  . Smokeless tobacco: Not on file  . Alcohol Use: 0.0 oz/week    0 Standard drinks or equivalent per week     Comment: occasional drinks    FAMILY HISTORY:   Family History  Problem Relation Age of Onset  . Breast cancer Maternal Aunt 57  . Breast cancer Maternal Grandmother 44  . Hypertension Mother   . Diabetes Mellitus II Mother   . Cancer - Other Father     brain tumor  . Dementia Mother     DRUG ALLERGIES:   Allergies  Allergen Reactions  . Codeine Nausea And Vomiting     REVIEW OF SYSTEMS:  CONSTITUTIONAL: feels feverish but no documented fever, NO fatigue ++  generalized weakness.  EYES: No blurred or double vision.  EARS, NOSE, AND THROAT: No tinnitus or ear pain.  RESPIRATORY: Positive for nonproductive cough, shortness of breath, wheezing   . Denies hemoptysis.  CARDIOVASCULAR: No chest pain, orthopnea, edema.  GASTROINTESTINAL: No nausea, vomiting, diarrhea or abdominal pain.  GENITOURINARY: No dysuria, hematuria.  ENDOCRINE: No polyuria, nocturia,  HEMATOLOGY: No anemia, easy bruising or bleeding SKIN: No rash or lesion. MUSCULOSKELETAL: Positive for arthritis   NEUROLOGIC: No tingling, numbness, weakness.  PSYCHIATRY: No anxiety or depression.   MEDICATIONS AT HOME:   Prior to Admission medications   Medication Sig Start Date End Date Taking? Authorizing Provider  albuterol (PROAIR HFA) 108 (90 BASE) MCG/ACT inhaler Inhale 2 puffs into the lungs every 6 (six) hours as needed. For wheezing.    Historical Provider, MD  aspirin EC 81 MG tablet Take 81 mg by mouth daily.    Historical Provider, MD  benzonatate (TESSALON) 200 MG capsule Take 1 capsule (200 mg total) by mouth 3 (three) times daily. 12/28/14   Gladstone Lighter, MD  budesonide-formoterol (SYMBICORT) 160-4.5 MCG/ACT inhaler Inhale 2 puffs into the lungs 2 (two) times daily.    Historical Provider, MD  esomeprazole (NEXIUM) 40 MG capsule Take 40 mg by mouth daily.    Historical Provider, MD  guaiFENesin-codeine 100-10 MG/5ML syrup Take 10 mLs by mouth every 6 (  six) hours. 12/28/14   Gladstone Lighter, MD  ipratropium-albuterol (DUONEB) 0.5-2.5 (3) MG/3ML SOLN Inhale 3 mLs into the lungs 4 (four) times daily as needed. For wheezing.    Historical Provider, MD  ketorolac (TORADOL) 10 MG tablet Take 10 mg by mouth every 6 (six) hours as needed. For pain.    Historical Provider, MD  levofloxacin (LEVAQUIN) 500 MG tablet Take 1 tablet (500 mg total) by mouth daily. 12/28/14   Gladstone Lighter, MD  lisinopril-hydrochlorothiazide (PRINZIDE,ZESTORETIC) 10-12.5 MG tablet Take 1 tablet by  mouth daily.    Historical Provider, MD  loratadine (CLARITIN) 10 MG tablet Take 10 mg by mouth daily.    Historical Provider, MD  meclizine (ANTIVERT) 25 MG tablet Take 25 mg by mouth 3 (three) times daily as needed. For dizziness.    Historical Provider, MD  metoprolol succinate (TOPROL-XL) 25 MG 24 hr tablet Take 1 tablet (25 mg total) by mouth daily. 12/28/14   Gladstone Lighter, MD  Olopatadine HCl 0.2 % SOLN Apply 1 drop to eye daily.    Historical Provider, MD  tiotropium (SPIRIVA) 18 MCG inhalation capsule Place 18 mcg into inhaler and inhale daily.    Historical Provider, MD  tiZANidine (ZANAFLEX) 4 MG tablet Take 4 mg by mouth 3 (three) times daily as needed. For muscle spasms.    Historical Provider, MD  traMADol (ULTRAM) 50 MG tablet Take 1 tablet (50 mg total) by mouth every 6 (six) hours as needed. 12/28/14   Gladstone Lighter, MD  traZODone (DESYREL) 100 MG tablet Take 100 mg by mouth at bedtime.    Historical Provider, MD  triamcinolone (NASACORT) 55 MCG/ACT AERO nasal inhaler Place 2 sprays into the nose daily.    Historical Provider, MD      VITAL SIGNS:  Temp 98.9 , Respiratory rate 24 Heart rate 122 Blood pressure 153/100  PE  GENERAL:  59 y.o.-year-old patient sitting upright the bed in acute distress, using accessory muscles. Short sentences EYES: Pupils equal, round, reactive to light and accommodation. No scleral icterus. Extraocular muscles intact.  HEENT: Head atraumatic, normocephalic. Oropharynx and nasopharynx clear.  NECK:  Supple, no jugular venous distention. No thyroid enlargement, no tenderness.  LUNGS: Bilateral prolonged expiratory wheezing with use of accessory muscles, ++tripoding. No crackles or rales CARDIOVASCULAR: Sinus tachycardia . No murmurs, rubs, or gallops.  ABDOMEN: Soft, nontender, nondistended. Bowel sounds present. No organomegaly or mass.  EXTREMITIES: No pedal edema, cyanosis, or clubbing.  NEUROLOGIC: Cranial nerves II through XII are  grossly intact. No focal deficits. PSYCHIATRIC: The patient is alert and oriented x 3.  SKIN: No obvious rash, lesion, or ulcer.   LABORATORY PANEL:   CBC  Recent Labs Lab 01/16/15 0951  WBC 11.1*  HGB 14.2  HCT 42.9  PLT 261   ------------------------------------------------------------------------------------------------------------------  Chemistries  No results for input(s): NA, K, CL, CO2, GLUCOSE, BUN, CREATININE, CALCIUM, MG, AST, ALT, ALKPHOS, BILITOT in the last 168 hours.  Invalid input(s): GFRCGP ------------------------------------------------------------------------------------------------------------------  Cardiac Enzymes No results for input(s): TROPONINI in the last 168 hours. ------------------------------------------------------------------------------------------------------------------  RADIOLOGY:  chest x-ray shows no acute infiltrate  EKG:   Sinus tachycardia, no ST elevation or depression   IMPRESSION AND PLAN:    59 year old female with a history of COPD and chronic respiratory failure on 2 L of oxygen and sinus tachycardia who presents with COPD exacerbation.  1. COPD acute exacerbation: Patient is currently tripoding and using accessory muscles and tachypna. She has improved some with 3 NEB treatments in the  emergency room, however, she still has a long way to go until she is at her baseline. She will require ongoing oxygen, DuoNeb's, inhalers, azithromycin and IV steroids.She will require close monitoring.  2. Sinus tachycardia: Patient's heart rate is elevated, but she does have a history of sinus tachycardia. Patient will be admitted with telemetry.  3. Accelerated hypertension on Essential hypertension: This is primarily due to her COPD exacerbation. Patient will resume her outpatient medications including lisinopril/HCTZ and metoprolol and will have when necessary hydralazine ordered.        All the records are reviewed and case  discussed with ED provider. Management plans discussed with the patient and she is in agreement.  CODE STATUS: FULL  CRITICAL CARE TOTAL TIME TAKING CARE OF THIS PATIENT: 45 minutes.  HIgh risk of pulmonary arrest.    Jerrell Hart M.D on 01/16/2015 at 11:08 AM  Between 7am to 6pm - Pager - [phone] After 6pm go to [email or web address] - password [email or web address] [facility]  Office  [phone]  CC: Primary care physician; Marguerita Merles, MD

## 2015-01-17 LAB — TROPONIN I
Troponin I: 0.05 ng/mL — ABNORMAL HIGH (ref <0.031)
Troponin I: 0.04 ng/mL — ABNORMAL HIGH (ref <0.031)

## 2015-01-17 LAB — BASIC METABOLIC PANEL
Anion gap: 10 (ref 5–15)
BUN: 23 mg/dL — AB (ref 6–20)
CHLORIDE: 100 mmol/L — AB (ref 101–111)
CO2: 29 mmol/L (ref 22–32)
CREATININE: 0.93 mg/dL (ref 0.44–1.00)
Calcium: 9.3 mg/dL (ref 8.9–10.3)
GFR calc Af Amer: >60 mL/min (ref >60)
GFR calc non Af Amer: >60 mL/min (ref >60)
Glucose, Bld: 172 mg/dL — ABNORMAL HIGH (ref 65–99)
Potassium: 3.8 mmol/L (ref 3.5–5.1)
SODIUM: 139 mmol/L (ref 135–145)

## 2015-01-17 LAB — GLUCOSE, CAPILLARY
Glucose-Capillary: 120 mg/dL — ABNORMAL HIGH (ref 65–99)
Glucose-Capillary: 166 mg/dL — ABNORMAL HIGH (ref 65–99)

## 2015-01-17 LAB — CBC
HCT: 42.1 % (ref 35.0–47.0)
Hemoglobin: 13.6 g/dL (ref 12.0–16.0)
MCH: 29.7 pg (ref 26.0–34.0)
MCHC: 32.3 g/dL (ref 32.0–36.0)
MCV: 92.1 fL (ref 80.0–100.0)
PLATELETS: 284 10*3/uL (ref 150–440)
RBC: 4.57 MIL/uL (ref 3.80–5.20)
RDW: 13.7 % (ref 11.5–14.5)
WBC: 15.9 10*3/uL — AB (ref 3.6–11.0)

## 2015-01-17 MED ORDER — INSULIN ASPART 100 UNIT/ML ~~LOC~~ SOLN
0.0000 [IU] | Freq: Every day | SUBCUTANEOUS | Status: DC
  Administered 2015-01-20 – 2015-01-23 (×4): 2 [IU] via SUBCUTANEOUS
  Filled 2015-01-17 (×2): qty 2

## 2015-01-17 MED ORDER — INSULIN ASPART 100 UNIT/ML ~~LOC~~ SOLN
0.0000 [IU] | Freq: Three times a day (TID) | SUBCUTANEOUS | Status: DC
  Administered 2015-01-18: 1 [IU] via SUBCUTANEOUS
  Administered 2015-01-18 – 2015-01-19 (×3): 2 [IU] via SUBCUTANEOUS
  Administered 2015-01-20: 1 [IU] via SUBCUTANEOUS
  Administered 2015-01-20: 3 [IU] via SUBCUTANEOUS
  Administered 2015-01-20: 1 [IU] via SUBCUTANEOUS
  Administered 2015-01-21: 2 [IU] via SUBCUTANEOUS
  Administered 2015-01-21 (×2): 1 [IU] via SUBCUTANEOUS
  Administered 2015-01-22: 2 [IU] via SUBCUTANEOUS
  Administered 2015-01-22: 1 [IU] via SUBCUTANEOUS
  Administered 2015-01-22: 2 [IU] via SUBCUTANEOUS
  Administered 2015-01-23: 1 [IU] via SUBCUTANEOUS
  Administered 2015-01-24: 2 [IU] via SUBCUTANEOUS
  Administered 2015-01-24: 1 [IU] via SUBCUTANEOUS
  Filled 2015-01-17: qty 1
  Filled 2015-01-17: qty 3
  Filled 2015-01-17: qty 2
  Filled 2015-01-17: qty 1
  Filled 2015-01-17: qty 2
  Filled 2015-01-17: qty 1
  Filled 2015-01-17: qty 2
  Filled 2015-01-17 (×2): qty 1
  Filled 2015-01-17 (×2): qty 2
  Filled 2015-01-17 (×3): qty 1
  Filled 2015-01-17 (×4): qty 2

## 2015-01-17 NOTE — Progress Notes (Signed)
Pt c/o sudden right-sided sharp chest pain in addition to pain with cough. Pt stated she had similar chest pain in the ED but did not report it. VS - BP 144/99, P 103, RR 24, O2 sat 94% on 2L [place], T 98.3. Dr. Jannifer Franklin notified, ordered stat EKG. Performed by RT, results reported back to Dr. Jannifer Franklin. Ordered 2 mg morphine. Pt stated she had no more chest pain upon pain reassessment. Will continue to assess.

## 2015-01-17 NOTE — Progress Notes (Signed)
[facility] [place] NAME: Dache Malmgren    MR#:  [id number]  DATE OF BIRTH:  Aug 16, 1955  SUBJECTIVE:  Feeling slightly better still wheezing and SOB + cough had rib pain last night relieved with morphine  REVIEW OF SYSTEMS:    Review of Systems  Constitutional: Negative for fever, chills and malaise/fatigue.  HENT: Negative for sore throat.   Eyes: Negative for blurred vision.  Respiratory: Negative for cough, hemoptysis, shortness of breath and wheezing.   Cardiovascular: Negative for chest pain, palpitations and leg swelling.  Gastrointestinal: Negative for nausea, vomiting, abdominal pain, diarrhea and blood in stool.  Genitourinary: Negative for dysuria.  Musculoskeletal: Negative for back pain.  Neurological: Negative for dizziness, tremors and headaches.  Endo/Heme/Allergies: Does not bruise/bleed easily.    Tolerating Diet: yes      DRUG ALLERGIES:  No Known Allergies  VITALS:  Blood pressure 125/59, pulse 76, temperature 98.3 F (36.8 C), temperature source Oral, resp. rate 24, height 5\' 7"  (1.702 m), weight 79.379 kg (175 lb), SpO2 97 %.  PHYSICAL EXAMINATION:   Physical Exam  Constitutional: She is oriented to person, place, and time and well-developed, well-nourished, and in no distress. No distress.  HENT:  Head: Normocephalic.  Eyes: No scleral icterus.  Neck: Normal range of motion. Neck supple. No JVD present. No tracheal deviation present.  Cardiovascular: Normal rate, regular rhythm and normal heart sounds.  Exam reveals no gallop and no friction rub.   No murmur heard. Pulmonary/Chest: She is in respiratory distress. She has wheezes. She has no rales. She exhibits no tenderness.  Abdominal: Soft. Bowel sounds are normal. She exhibits no distension and no mass. There is no tenderness. There is no rebound and no guarding.  Musculoskeletal: Normal range of motion. She exhibits no edema.  Neurological:  She is alert and oriented to person, place, and time.  Skin: Skin is warm. No rash noted. No erythema.  Psychiatric: Affect and judgment normal.      LABORATORY PANEL:   CBC  Recent Labs Lab 01/17/15 0652  WBC 15.9*  HGB 13.6  HCT 42.1  PLT 284   ------------------------------------------------------------------------------------------------------------------  Chemistries   Recent Labs Lab 01/16/15 1116 01/17/15 0652  NA 146* 139  K 4.5 3.8  CL 105 100*  CO2 27 29  GLUCOSE 109* 172*  BUN 22* 23*  CREATININE 0.91 0.93  CALCIUM 9.5 9.3  AST 29  --   ALT 22  --   ALKPHOS 72  --   BILITOT 0.6  --    ------------------------------------------------------------------------------------------------------------------  Cardiac Enzymes  Recent Labs Lab 01/16/15 1116  TROPONINI <0.03   ------------------------------------------------------------------------------------------------------------------  RADIOLOGY:  Dg Chest 1 View  01/16/2015  CLINICAL DATA:  Shortness of breath with severe chest pain 3 days. EXAM: CHEST 1 VIEW COMPARISON:  12/26/2014 FINDINGS: The lungs are adequately inflated without consolidation or effusion. Cardiomediastinal silhouette, bones and soft tissues are within normal. IMPRESSION: No active disease. Electronically Signed   By: Marin Olp M.D.   On: 01/16/2015 10:04     ASSESSMENT AND PLAN:   59 year old female with a history of COPD and chronic respiratory failure on 2 L of oxygen and sinus tachycardia who presents with COPD exacerbation.  1. COPD acute exacerbation: Patient is feeling slightly better. However, is still with wheezing.  I think she needs current dosing of steroids, with possible tapering tomorrow. Continue O2, INH and NEBS.   2. Sinus tachycardia: Patient's heart rate is  elevated, but she does have a history of sinus tachycardia.   3. Accelerated hypertension on Essential hypertension: Continue  lisinopril/HCTZ and  metoprolol and when necessary hydralazine.   4. Rib pain likely from cough. Order set of trops      Management plans discussed with the patient and she is in agreement.  CODE STATUS: FULL  TOTAL TIME TAKING CARE OF THIS PATIENT: 45 minutes.     POSSIBLE D/C 3-4 days, DEPENDING ON CLINICAL CONDITION.   Krayton Wortley M.D on 01/17/2015 at 1:29 PM  Between 7am to 6pm - Pager - [phone] After 6pm go to [email or web address] - password [email or web address] [facility]  Office  [phone]  CC: Primary care physician; Marguerita Merles, MD  Note: This dictation was prepared with Dragon dictation along with smaller phrase technology. Any transcriptional errors that result from this process are unintentional.

## 2015-01-17 NOTE — Progress Notes (Signed)
MD notified about elevated troponin. No new orders.

## 2015-01-18 ENCOUNTER — Inpatient Hospital Stay: Payer: Medicaid Other

## 2015-01-18 LAB — GLUCOSE, CAPILLARY
GLUCOSE-CAPILLARY: 165 mg/dL — AB (ref 65–99)
Glucose-Capillary: 126 mg/dL — ABNORMAL HIGH (ref 65–99)
Glucose-Capillary: 155 mg/dL — ABNORMAL HIGH (ref 65–99)
Glucose-Capillary: 113 mg/dL — ABNORMAL HIGH (ref 65–99)

## 2015-01-18 LAB — CBC
HCT: 41.5 % (ref 35.0–47.0)
HEMOGLOBIN: 13.2 g/dL (ref 12.0–16.0)
MCH: 29.4 pg (ref 26.0–34.0)
MCHC: 31.7 g/dL — ABNORMAL LOW (ref 32.0–36.0)
MCV: 92.7 fL (ref 80.0–100.0)
Platelets: 268 10*3/uL (ref 150–440)
RBC: 4.48 MIL/uL (ref 3.80–5.20)
RDW: 13.7 % (ref 11.5–14.5)
WBC: 17.2 10*3/uL — ABNORMAL HIGH (ref 3.6–11.0)

## 2015-01-18 LAB — TROPONIN I

## 2015-01-18 NOTE — Progress Notes (Signed)
The patient is known to Dr. Vella Kohler, and was last seen by him a few weeks ago. Will change. Consult to Dr. Vella Kohler.

## 2015-01-18 NOTE — Progress Notes (Signed)
[facility] [place] NAME: Andrea Bell    MR#:  [id number]  DATE OF BIRTH:  21-Oct-1955  SUBJECTIVE:  Feeling about same, still wheezing and SOB + cough.  Not able to sleep due to constant coughing   REVIEW OF SYSTEMS:    Review of Systems  Constitutional: Negative for fever, chills and malaise/fatigue.  HENT: Negative for sore throat.   Eyes: Negative for blurred vision.  Respiratory: Positive for cough, shortness of breath and wheezing. Negative for hemoptysis.   Cardiovascular: Negative for chest pain, palpitations and leg swelling.  Gastrointestinal: Negative for nausea, vomiting, abdominal pain, diarrhea and blood in stool.  Genitourinary: Negative for dysuria.  Musculoskeletal: Negative for back pain.  Neurological: Negative for dizziness, tremors and headaches.  Endo/Heme/Allergies: Does not bruise/bleed easily.    Tolerating Diet: yes DRUG ALLERGIES:  No Known Allergies VITALS:  Blood pressure 114/75, pulse 66, temperature 98.3 F (36.8 C), temperature source Oral, resp. rate 18, height 5\' 7"  (1.702 m), weight 79.379 kg (175 lb), SpO2 96 %. PHYSICAL EXAMINATION:   Physical Exam  Constitutional: She is oriented to person, place, and time and well-developed, well-nourished, and in no distress. No distress.  HENT:  Head: Normocephalic.  Eyes: No scleral icterus.  Neck: Normal range of motion. Neck supple. No JVD present. No tracheal deviation present.  Cardiovascular: Normal rate, regular rhythm and normal heart sounds.  Exam reveals no gallop and no friction rub.   No murmur heard. Pulmonary/Chest: She is in respiratory distress. She has wheezes. She has no rales. She exhibits no tenderness.  Abdominal: Soft. Bowel sounds are normal. She exhibits no distension and no mass. There is no tenderness. There is no rebound and no guarding.  Musculoskeletal: Normal range of motion. She exhibits no edema.  Neurological: She  is alert and oriented to person, place, and time.  Skin: Skin is warm. No rash noted. No erythema.  Psychiatric: Affect and judgment normal.   LABORATORY PANEL:   CBC  Recent Labs Lab 01/18/15 0119  WBC 17.2*  HGB 13.2  HCT 41.5  PLT 268   ------------------------------------------------------------------------------------------------------------------  Chemistries   Recent Labs Lab 01/16/15 1116 01/17/15 0652  NA 146* 139  K 4.5 3.8  CL 105 100*  CO2 27 29  GLUCOSE 109* 172*  BUN 22* 23*  CREATININE 0.91 0.93  CALCIUM 9.5 9.3  AST 29  --   ALT 22  --   ALKPHOS 72  --   BILITOT 0.6  --    ------------------------------------------------------------------------------------------------------------------  Cardiac Enzymes  Recent Labs Lab 01/17/15 1347 01/17/15 1854 01/18/15 0119  TROPONINI 0.05* 0.04* <0.03   ------------------------------------------------------------------------------------------------------------------  RADIOLOGY:  Dg Chest 1 View  01/16/2015  CLINICAL DATA:  Shortness of breath with severe chest pain 3 days. EXAM: CHEST 1 VIEW COMPARISON:  12/26/2014 FINDINGS: The lungs are adequately inflated without consolidation or effusion. Cardiomediastinal silhouette, bones and soft tissues are within normal. IMPRESSION: No active disease. Electronically Signed   By: Marin Olp M.D.   On: 01/16/2015 10:04     ASSESSMENT AND PLAN:   59 year old female with a history of COPD and chronic respiratory failure on 2 L of oxygen and sinus tachycardia who presents with COPD exacerbation.  1. COPD acute exacerbation: still with wheezing.   continue steroids (Solu-Medrol 60 mg IV every 8 hours), azithromycin, Symbicort, Spiriva. Continue O2, INH and NEBS.  - third readmission for COPD exacerbation in last 6 months - initiate COPD Gold  -  CT scan of the chest for further evaluation   2. Sinus tachycardia: Patient's heart rate is elevated, but she does  have a history of sinus tachycardia.   3. Accelerated hypertension on Essential hypertension: Continue  lisinopril/HCTZ and metoprolol and when necessary hydralazine.  4. Rib pain likely from cough.  Tessalon Perles for coughing along with Robitussin syrup as needed   5.  GERD: Continue Protonix and Maalox as needed,   6.  Borderline elevated troponin: likely due to supply demand ischemia   Will discontinue telemetry  Management plans discussed with the patient and she is in agreement.  CODE STATUS: FULL  TOTAL TIME TAKING CARE OF THIS PATIENT: 35 minutes.     POSSIBLE D/C 3-4 days, DEPENDING ON CLINICAL CONDITION. and pulmonary evaluation    [facility], Andrea Bell M.D on 01/18/2015 at 7:24 AM  Between 7am to 6pm - Pager - [phone] After 6pm go to [email or web address] - password [email or web address] [facility]  Office  [phone]  CC: Primary care physician; Andrea Merles, MD  Note: This dictation was prepared with Dragon dictation along with smaller phrase technology. Any transcriptional errors that result from this process are unintentional.

## 2015-01-18 NOTE — Evaluation (Signed)
Occupational Therapy Evaluation Patient Details Name: Arabelle Bollig MRN: [id number] DOB: Dec 26, 1955 Today's Date: 01/18/2015    History of Present Illness This patient is a 59 year old female with a history of COPD, came to [facility] with chest pain, headache, and shortness of breath.   Clinical Impression   This patient is a 60 year old female with the above history. She lives with her husband in a mobile home and had been independent with activities of daily living and functional mobility using O2 but no assistive device. She now needs a walker and contact guard assist. She does get shortness of breath with many activities. She would benefit from Occupational Therapy for activities of daily living training to reduce shortness of breath.     Follow Up Recommendations       Equipment Recommendations       Recommendations for Other Services       Precautions / Restrictions Restrictions Weight Bearing Restrictions: No      Mobility Bed Mobility                  Transfers                 General transfer comment: Patient is contact guard for mobility using a rolling walker per Physical Therapy.    Balance                                            ADL                                         General ADL Comments: Patient had been independent with basic ADL but does report shortness of breath during many ADL. Reinforced with patient energy saving techniques. Practiced techniques to Donned/doffed socks and pants to knees using hip kit to minimize bending over and shortness of breath. Patient needed cues for technique and was able to complete this with cues only. Given list of vendors who carry hip kit.     Vision     Perception     Praxis      Pertinent Vitals/Pain       Hand Dominance     Extremity/Trunk Assessment Upper Extremity Assessment Upper Extremity Assessment: Overall WFL for tasks assessed            Communication     Cognition Arousal/Alertness: Awake/alert Behavior During Therapy: WFL for tasks assessed/performed Overall Cognitive Status: Within Functional Limits for tasks assessed                     General Comments   Some overlap with Physical Therapy     Exercises       Shoulder Instructions      Home Living Family/patient expects to be discharged to:: Private residence Living Arrangements: Spouse/significant other Available Help at Discharge: Family Type of Home: Mobile home Home Access: Stairs to enter Entrance Stairs-Number of Steps: 3  Entrance Stairs-Rails: Can reach both Home Layout: One level               Home Equipment:  (Home O2)          Prior Functioning/Environment               OT Diagnosis: Generalized weakness  OT Problem List: Decreased activity tolerance   OT Treatment/Interventions: Self-care/ADL training    OT Goals(Current goals can be found in the care plan section) Acute Rehab OT Goals Patient Stated Goal: To get better and go home OT Goal Formulation: With patient Time For Goal Achievement: 02/01/15 Potential to Achieve Goals: Good  OT Frequency: Min 1X/week   Barriers to D/C:            Co-evaluation              End of Session Equipment Utilized During Treatment:  (hip kit)  Activity Tolerance:   Patient left: in chair;with call bell/phone within reach (Patient is moderate fall risk (9))   Time: 4496-7591 OT Time Calculation (min): 23 min Charges:  OT General Charges $OT Visit: 1 Procedure OT Evaluation $Initial OT Evaluation Tier I: 1 Procedure OT Treatments $Self Care/Home Management : 8-22 mins G-Codes:    Myrene Galas, MS/OTR/L  01/18/2015, 12:10 PM

## 2015-01-18 NOTE — Evaluation (Signed)
Physical Therapy Evaluation Patient Details Name: Andrea Bell MRN: [id number] DOB: [birth year] Today's Date: 01/18/2015   History of Present Illness  Patient is a 59 y.o. female with h/o COPD presenting to hospital with SOB, chest pain, and HA and admitted with COPD exacerbation.  Clinical Impression  Prior to admission, pt was independent without AD and uses 2 L/min O2 via nasal cannula at home.  Pt lives with her husband in 1 level home with 5 steps to enter with railings.  Currently pt is CGA with transfers and gait with RW (pt unsteady without AD requiring RW for balance with ambulation). Pt's O2>93% on 2 L/min via nasal cannula throughout session.  Pt would benefit from skilled PT to address noted impairments and functional limitations.  Recommend pt discharge to home when medically appropriate (recommend HHPT and RW pending pt's progress during hospital stay).     Follow Up Recommendations  (HHPT pending pt's progress)    Equipment Recommendations   (RW pending pt's progress)    Recommendations for Other Services       Precautions / Restrictions Precautions Precautions:  (Moderate fall risk) Restrictions Weight Bearing Restrictions: No      Mobility  Bed Mobility Overal bed mobility: Modified Independent             General bed mobility comments: Supine to sit modified independent with HOB elevated  Transfers Overall transfer level: Needs assistance   Transfers: Sit to/from Stand Sit to Stand: Min guard         General transfer comment: no loss of balance noted  Ambulation/Gait Ambulation/Gait assistance: Min guard;Min assist Ambulation Distance (Feet): 200 Feet Assistive device: Rolling walker (2 wheeled)   Gait velocity: decreased   General Gait Details: increased lateral sway initially (pt reporting feeling unsteady so pt given RW to trial) and pt initially CGA to min assist to steady first 40 feet but then pt CGA with ambulation after that with RW;  SOB noted but O2 sats WFL on 2 L/min via nasal cannula  Stairs            Wheelchair Mobility    Modified Rankin (Stroke Patients Only)       Balance Overall balance assessment: Needs assistance Sitting-balance support: No upper extremity supported;Feet supported Sitting balance-Leahy Scale: Normal     Standing balance support: Bilateral upper extremity supported (on RW) Standing balance-Leahy Scale: Good                               Pertinent Vitals/Pain Pain Assessment: No/denies pain  Vitals stable and WFL throughout treatment session on 2 L/min via nasal cannula.    Home Living Family/patient expects to be discharged to:: Private residence Living Arrangements: Spouse/significant other Available Help at Discharge: Family Type of Home: Mobile home Home Access: Stairs to enter Entrance Stairs-Rails: Psychiatric nurse of Steps: 5 Home Layout: One level Home Equipment:  (Home O2)      Prior Function Level of Independence: Independent         Comments: Home O2 2 L/min     Hand Dominance        Extremity/Trunk Assessment   Upper Extremity Assessment: Overall WFL for tasks assessed           Lower Extremity Assessment: Overall WFL for tasks assessed         Communication   Communication: No difficulties  Cognition Arousal/Alertness: Awake/alert Behavior During Therapy: WFL for tasks  assessed/performed Overall Cognitive Status: Within Functional Limits for tasks assessed                      General Comments   Nursing cleared pt for participation in physical therapy.  Pt agreeable to PT session.    Exercises        Assessment/Plan    PT Assessment Patient needs continued PT services  PT Diagnosis Difficulty walking   PT Problem List Decreased balance;Decreased mobility;Cardiopulmonary status limiting activity;Decreased knowledge of use of DME  PT Treatment Interventions DME instruction;Gait  training;Stair training;Functional mobility training;Therapeutic activities;Therapeutic exercise;Balance training;Patient/family education   PT Goals (Current goals can be found in the Care Plan section) Acute Rehab PT Goals Patient Stated Goal: to go home PT Goal Formulation: With patient Time For Goal Achievement: 02/01/15 Potential to Achieve Goals: Good    Frequency Min 2X/week   Barriers to discharge        Co-evaluation               End of Session Equipment Utilized During Treatment: Gait belt;Oxygen Activity Tolerance: Patient tolerated treatment well Patient left: in chair;with call bell/phone within reach (OT present) Nurse Communication: Mobility status (O2 saturations)         Time: [id number] PT Time Calculation (min) (ACUTE ONLY): 18 min   Charges:   PT Evaluation $Initial PT Evaluation Tier I: 1 Procedure     PT G CodesLeitha Bleak 2015/01/31, 1:46 PM Leitha Bleak, [place]

## 2015-01-19 LAB — CBC
HEMATOCRIT: 39.1 % (ref 35.0–47.0)
HEMOGLOBIN: 12.9 g/dL (ref 12.0–16.0)
MCH: 30.2 pg (ref 26.0–34.0)
MCHC: 33 g/dL (ref 32.0–36.0)
MCV: 91.7 fL (ref 80.0–100.0)
Platelets: 236 10*3/uL (ref 150–440)
RBC: 4.27 MIL/uL (ref 3.80–5.20)
RDW: 13.7 % (ref 11.5–14.5)
WBC: 12 10*3/uL — AB (ref 3.6–11.0)

## 2015-01-19 LAB — GLUCOSE, CAPILLARY
Glucose-Capillary: 158 mg/dL — ABNORMAL HIGH (ref 65–99)
Glucose-Capillary: 173 mg/dL — ABNORMAL HIGH (ref 65–99)
Glucose-Capillary: 115 mg/dL — ABNORMAL HIGH (ref 65–99)
Glucose-Capillary: 150 mg/dL — ABNORMAL HIGH (ref 65–99)

## 2015-01-19 LAB — BASIC METABOLIC PANEL
Anion gap: 6 (ref 5–15)
BUN: 26 mg/dL — ABNORMAL HIGH (ref 6–20)
CALCIUM: 8.7 mg/dL — AB (ref 8.9–10.3)
CO2: 31 mmol/L (ref 22–32)
CREATININE: 0.82 mg/dL (ref 0.44–1.00)
Chloride: 101 mmol/L (ref 101–111)
GFR calc non Af Amer: >60 mL/min (ref >60)
Glucose, Bld: 171 mg/dL — ABNORMAL HIGH (ref 65–99)
Potassium: 3.6 mmol/L (ref 3.5–5.1)
Sodium: 138 mmol/L (ref 135–145)

## 2015-01-19 MED ORDER — SENNOSIDES-DOCUSATE SODIUM 8.6-50 MG PO TABS
2.0000 | ORAL_TABLET | Freq: Two times a day (BID) | ORAL | Status: DC
  Administered 2015-01-19 – 2015-01-24 (×9): 2 via ORAL
  Filled 2015-01-19 (×10): qty 2

## 2015-01-19 NOTE — Progress Notes (Signed)
[place] at [place] NAME: Andrea Bell    MR#:  [id number]  DATE OF BIRTH:  30-Jan-1956  SUBJECTIVE:  Feeling about same, still wheezing and SOB + cough. Sitting in chair  REVIEW OF SYSTEMS:    Review of Systems  Constitutional: Negative for fever, chills and malaise/fatigue.  HENT: Negative for sore throat.   Eyes: Negative for blurred vision.  Respiratory: Positive for cough, shortness of breath and wheezing. Negative for hemoptysis.   Cardiovascular: Negative for chest pain, palpitations and leg swelling.  Gastrointestinal: Negative for nausea, vomiting, abdominal pain, diarrhea and blood in stool.  Genitourinary: Negative for dysuria.  Musculoskeletal: Negative for back pain.  Neurological: Negative for dizziness, tremors and headaches.  Endo/Heme/Allergies: Does not bruise/bleed easily.    Tolerating Diet: yes DRUG ALLERGIES:  No Known Allergies VITALS:  Blood pressure 118/72, pulse 71, temperature 97.6 F (36.4 C), temperature source Oral, resp. rate 17, height 5\' 7"  (1.702 m), weight 79.379 kg (175 lb), SpO2 98 %. PHYSICAL EXAMINATION:   Physical Exam  Constitutional: She is oriented to person, place, and time and well-developed, well-nourished, and in no distress. No distress.  HENT:  Head: Normocephalic.  Eyes: No scleral icterus.  Neck: Normal range of motion. Neck supple. No JVD present. No tracheal deviation present.  Cardiovascular: Normal rate, regular rhythm and normal heart sounds.  Exam reveals no gallop and no friction rub.   No murmur heard. Pulmonary/Chest: She is in respiratory distress. She has wheezes. She has no rales. She exhibits no tenderness.  Abdominal: Soft. Bowel sounds are normal. She exhibits no distension and no mass. There is no tenderness. There is no rebound and no guarding.  Musculoskeletal: Normal range of motion. She exhibits no edema.  Neurological: She is alert and oriented to  person, place, and time.  Skin: Skin is warm. No rash noted. No erythema.  Psychiatric: Affect and judgment normal.   LABORATORY PANEL:   CBC  Recent Labs Lab 01/19/15 0556  WBC 12.0*  HGB 12.9  HCT 39.1  PLT 236   ------------------------------------------------------------------------------------------------------------------  Chemistries   Recent Labs Lab 01/16/15 1116  01/19/15 0556  NA 146*  < > 138  K 4.5  < > 3.6  CL 105  < > 101  CO2 27  < > 31  GLUCOSE 109*  < > 171*  BUN 22*  < > 26*  CREATININE 0.91  < > 0.82  CALCIUM 9.5  < > 8.7*  AST 29  --   --   ALT 22  --   --   ALKPHOS 72  --   --   BILITOT 0.6  --   --   < > = values in this interval not displayed. ------------------------------------------------------------------------------------------------------------------  Cardiac Enzymes  Recent Labs Lab 01/17/15 1347 01/17/15 1854 01/18/15 0119  TROPONINI 0.05* 0.04* <0.03   ------------------------------------------------------------------------------------------------------------------  RADIOLOGY:  Ct Chest Wo Contrast  01/18/2015  CLINICAL DATA:  Shortness of breath worsening since Thursday. History of COPD and bronchitis. Patient is 59 years old. EXAM: CT CHEST WITHOUT CONTRAST TECHNIQUE: Multidetector CT imaging of the chest was performed following the standard protocol without IV contrast. COMPARISON:  Chest radiograph 01/16/2015 and chest radiograph 02/06/2013. FINDINGS: Heart size is within normal limits. Normal caliber thoracic aorta. Minimal focal areas of atherosclerotic calcification in the ascending thoracic aorta and at the origin of great vessels. No visible coronary artery atherosclerotic calcification. Negative for supraclavicular, mediastinal, hilar, or axillary lymphadenopathy. Negative  for pleural or pericardial abnormality. There is an air-fluid level in the upper thoracic esophagus. Otherwise esophagus is unremarkable. The lungs are  well expanded, aside from focal linear areas of atelectasis in the left lower lobe. The trachea and mainstem bronchi are patent. Moderate centrilobular is emphysema is noted, affecting the upper lobes to the greatest degree. No interstitial abnormality, discrete nodule, or mass lesion. There is some focal scarring at the extreme right lung apex. The stomach is normally positioned. Normal adrenal glands. Imaged portion of the liver, gallbladder, spleen, kidneys, and pancreas is unremarkable. Soft tissues of the body wall and breast included in the imaging field appear within normal limits. Thoracic spine vertebral bodies are normal in height and alignment. There is endplate degenerative changes at L1-L2. IMPRESSION: 1. Moderate centrilobular emphysema. 2. Mild left basilar atelectasis. Electronically Signed   By: Curlene Dolphin M.D.   On: 01/18/2015 10:11     ASSESSMENT AND PLAN:   59 year old female with a history of COPD and chronic respiratory failure on 2 L of oxygen and sinus tachycardia who presents with COPD exacerbation.  1. COPD acute exacerbation: still with wheezing.   continue steroids (Solu-Medrol 60 mg IV every 8 hours), azithromycin, Symbicort, Spiriva. Continue O2, INH and NEBS. - third readmission for COPD exacerbation in last 6 months - initiate COPD Gold - await pulmo input - CT scan of the chest showed severe emphysema  2. Sinus tachycardia: Patient's heart rate is elevated, but she does have a history of sinus tachycardia.   3. Accelerated hypertension on Essential hypertension: Continue  lisinopril/HCTZ and metoprolol and when necessary hydralazine.  4. Rib pain likely from cough.  Tessalon Perles for coughing along with Robitussin syrup as needed   5.  GERD: Continue Protonix and Maalox as needed,   6.  Borderline elevated troponin: likely due to supply demand ischemia   Will discontinue telemetry  Management plans discussed with the patient and she is in  agreement.  CODE STATUS: FULL  TOTAL TIME TAKING CARE OF THIS PATIENT: 35 minutes.     POSSIBLE D/C 3-4 days, DEPENDING ON CLINICAL CONDITION. and pulmonary evaluation    [facility], Sharyn Brilliant M.D on 01/19/2015 at 2:14 PM  Between 7am to 6pm - Pager - [phone] After 6pm go to [email or web address] - password [email or web address] [place] [facility]  Office  [phone]  CC: Primary care physician; Marguerita Merles, MD  Note: This dictation was prepared with Dragon dictation along with smaller phrase technology. Any transcriptional errors that result from this process are unintentional.

## 2015-01-19 NOTE — Progress Notes (Signed)
Physical Therapy Treatment Patient Details Name: Andrea Bell MRN: [id number] DOB: 1955-08-13 Today's Date: 01/19/2015    History of Present Illness Patient is a 59 y.o. female with h/o COPD presenting to hospital with SOB, chest pain, and HA and admitted with COPD exacerbation.    PT Comments    Pt appearing more steady with RW today but demonstrating some SOB and wheezing with activity.  Pt's O2 saturations on 2 L/min via nasal cannula >92% throughout session.  Pt reporting feeling like she needs a RW for balance and appears safe using RW.  Pt acknowledging need to keep O2 on at all times (pt reporting taking it off earlier when getting up and didn't do very well in terms of respiratory).  Will continue to progress pt with balance and progress to least restrictive AD as appropriate.   Follow Up Recommendations   (HHPT pending pt's progress)     Equipment Recommendations  Rolling walker with 5" wheels    Recommendations for Other Services       Precautions / Restrictions Precautions Precautions: Fall Restrictions Weight Bearing Restrictions: No    Mobility  Bed Mobility               General bed mobility comments: Deferred d/t pt sitting up in chair  Transfers Overall transfer level: Needs assistance Equipment used: Rolling walker (2 wheeled) Transfers: Sit to/from Stand Sit to Stand: Supervision         General transfer comment: no loss of balance noted  Ambulation/Gait Ambulation/Gait assistance: Supervision;Min guard Ambulation Distance (Feet): 350 Feet Assistive device: Rolling walker (2 wheeled)       General Gait Details: steady with RW without loss of balance; limited distance d/t SOB (pt requesting 2nd lap around nurses station)   Stairs            Wheelchair Mobility    Modified Rankin (Stroke Patients Only)       Balance   Sitting-balance support: No upper extremity supported;Feet supported Sitting balance-Leahy Scale:  Normal     Standing balance support: Bilateral upper extremity supported (on RW) Standing balance-Leahy Scale: Good                      Cognition Arousal/Alertness: Awake/alert Behavior During Therapy: WFL for tasks assessed/performed Overall Cognitive Status: Within Functional Limits for tasks assessed                      Exercises      General Comments   Nursing cleared pt for participation in physical therapy (also cleared pt for 2nd lap around nurses loop).  Pt agreeable to PT session.       Pertinent Vitals/Pain Pain Assessment: No/denies pain  Vitals stable and WFL throughout treatment session (see flowsheet for details).    Home Living                      Prior Function            PT Goals (current goals can now be found in the care plan section) Acute Rehab PT Goals Patient Stated Goal: to go home PT Goal Formulation: With patient Time For Goal Achievement: 02/01/15 Potential to Achieve Goals: Good Progress towards PT goals: Progressing toward goals    Frequency  Min 2X/week    PT Plan Current plan remains appropriate    Co-evaluation             End of  Session Equipment Utilized During Treatment: Gait belt;Oxygen Activity Tolerance: Other (comment) (SOB/wheezing noted with activity) Patient left: in chair;with call bell/phone within reach;with chair alarm set     Time: 1615-1630 PT Time Calculation (min) (ACUTE ONLY): 15 min  Charges:  $Therapeutic Activity: 8-22 mins                    G CodesLeitha Bleak February 09, 2015, 4:39 PM Leitha Bleak, [place]

## 2015-01-19 NOTE — Clinical Social Work Note (Signed)
CSW consulted due to patient being on COPD Gold protocol. CSW entered patient's room and she was ambulating to get a gown near the window in the room. Patient was noticeably wheezing and was propping herself against her bedside table. When asked if she needed assistance she stated she did not. CSW briefly informed her of the reason for CSW visit and patient denies any concerns with anxiety or depression regarding her COPD diagnosis or otherwise. CSW was not comfortable with the way patient was standing and wheezing so the nurse was informed and came to room to assess.  Shela Leff MSW,LCSW [phone]

## 2015-01-19 NOTE — Progress Notes (Signed)
Initial Nutrition Assessment     INTERVENTION:  Meals and snacks: Cater to pt preferences   NUTRITION DIAGNOSIS:    (None at this time) related to   as evidenced by  .    GOAL:   Patient will meet greater than or equal to 90% of their needs    MONITOR:    (Energy intake, Pulmonary profile, Glucose profile)  REASON FOR ASSESSMENT:   Consult COPD Protocol  ASSESSMENT:      Pt admitted with COPD exacerbation  Past Medical History  Diagnosis Date  . COPD (chronic obstructive pulmonary disease) (HCC)     2l home oxygen  . GERD (gastroesophageal reflux disease)   . HTN (hypertension)   . Tachycardia   . Anemia   . Chronic back pain   . Osteoporosis   . Osteoarthritis     Current Nutrition: eating 100% of meals mostly.  Reports tolerating meals  Food/Nutrition-Related History: pt reports appetite has been "too good"   Scheduled Medications:  . aspirin EC  81 mg Oral Daily  . azithromycin  500 mg Intravenous Q24H  . benzonatate  200 mg Oral TID  . budesonide-formoterol  2 puff Inhalation BID  . enoxaparin (LOVENOX) injection  40 mg Subcutaneous Q24H  . guaiFENesin-codeine  10 mL Oral Q6H  . lisinopril  10 mg Oral Daily   And  . hydrochlorothiazide  12.5 mg Oral Daily  . insulin aspart  0-5 Units Subcutaneous QHS  . insulin aspart  0-9 Units Subcutaneous TID WC  . ipratropium-albuterol  3 mL Nebulization Q4H  . loratadine  10 mg Oral Daily  . methylPREDNISolone (SOLU-MEDROL) injection  60 mg Intravenous 3 times per day  . metoprolol succinate  25 mg Oral Daily  . pantoprazole  40 mg Oral Daily  . sodium chloride  3 mL Intravenous Q12H  . tiotropium  18 mcg Inhalation Daily  . traZODone  100 mg Oral QHS  . triamcinolone  2 spray Nasal Daily      Electrolyte/Renal Profile and Glucose Profile:   Recent Labs Lab 01/16/15 1116 01/17/15 0652 01/19/15 0556  NA 146* 139 138  K 4.5 3.8 3.6  CL 105 100* 101  CO2 27 29 31   BUN 22* 23* 26*   CREATININE 0.91 0.93 0.82  CALCIUM 9.5 9.3 8.7*  GLUCOSE 109* 172* 171*   Protein Profile:  Recent Labs Lab 01/16/15 1116  ALBUMIN 4.2    Gastrointestinal Profile: Last BM: 11/26    Weight Change: wt stable prior to admission per pt    Diet Order:  Diet regular Room service appropriate?: Yes; Fluid consistency:: Thin  Skin:   reviewed   Height:   Ht Readings from Last 1 Encounters:  01/16/15 5\' 7"  (1.702 m)    Weight:   Wt Readings from Last 1 Encounters:  01/16/15 175 lb (79.379 kg)    Ideal Body Weight:     BMI:  Body mass index is 27.4 kg/(m^2).   EDUCATION NEEDS:   No education needs identified at this time  LOW Care Level  Zakariyya Helfman B. Zenia Resides, [place], [place] (pager)

## 2015-01-20 LAB — GLUCOSE, CAPILLARY
GLUCOSE-CAPILLARY: 129 mg/dL — AB (ref 65–99)
Glucose-Capillary: 233 mg/dL — ABNORMAL HIGH (ref 65–99)
Glucose-Capillary: 135 mg/dL — ABNORMAL HIGH (ref 65–99)
Glucose-Capillary: 204 mg/dL — ABNORMAL HIGH (ref 65–99)

## 2015-01-20 MED ORDER — POLYETHYLENE GLYCOL 3350 17 G PO PACK
17.0000 g | PACK | Freq: Two times a day (BID) | ORAL | Status: DC
Start: 2015-01-20 — End: 2015-01-24
  Administered 2015-01-20 – 2015-01-24 (×9): 17 g via ORAL
  Filled 2015-01-20 (×9): qty 1

## 2015-01-20 MED ORDER — BISACODYL 10 MG RE SUPP
10.0000 mg | Freq: Every day | RECTAL | Status: DC
  Administered 2015-01-20 – 2015-01-24 (×5): 10 mg via RECTAL
  Filled 2015-01-20 (×5): qty 1

## 2015-01-20 NOTE — Progress Notes (Addendum)
[place] at [place] NAME: Andrea Bell    MR#:  [id number]  DATE OF BIRTH:  1955-02-26  SUBJECTIVE:  Feeling little better, still wheezing and SOB + cough. Sitting in bed  REVIEW OF SYSTEMS:    Review of Systems  Constitutional: Negative for fever, chills and malaise/fatigue.  HENT: Negative for sore throat.   Eyes: Negative for blurred vision.  Respiratory: Positive for cough, shortness of breath and wheezing. Negative for hemoptysis.   Cardiovascular: Negative for chest pain, palpitations and leg swelling.  Gastrointestinal: Negative for nausea, vomiting, abdominal pain, diarrhea and blood in stool.  Genitourinary: Negative for dysuria.  Musculoskeletal: Negative for back pain.  Neurological: Negative for dizziness, tremors and headaches.  Endo/Heme/Allergies: Does not bruise/bleed easily.   Tolerating Diet: yes DRUG ALLERGIES:  No Known Allergies VITALS:  Blood pressure 123/68, pulse 69, temperature 97.5 F (36.4 C), temperature source Oral, resp. rate 10, height 5\' 7"  (1.702 m), weight 79.379 kg (175 lb), SpO2 96 %. PHYSICAL EXAMINATION:  Physical Exam  Constitutional: She is oriented to person, place, and time and well-developed, well-nourished, and in no distress. No distress.  HENT:  Head: Normocephalic.  Eyes: No scleral icterus.  Neck: Normal range of motion. Neck supple. No JVD present. No tracheal deviation present.  Cardiovascular: Normal rate, regular rhythm and normal heart sounds.  Exam reveals no gallop and no friction rub.   No murmur heard. Pulmonary/Chest: No respiratory distress. She has wheezes. She has no rales. She exhibits no tenderness.  Abdominal: Soft. Bowel sounds are normal. She exhibits no distension and no mass. There is no tenderness. There is no rebound and no guarding.  Musculoskeletal: Normal range of motion. She exhibits no edema.  Neurological: She is alert and oriented to person, place,  and time.  Skin: Skin is warm. No rash noted. No erythema.  Psychiatric: Affect and judgment normal.   LABORATORY PANEL:   CBC  Recent Labs Lab 01/19/15 0556  WBC 12.0*  HGB 12.9  HCT 39.1  PLT 236   ------------------------------------------------------------------------------------------------------------------  Chemistries   Recent Labs Lab 01/16/15 1116  01/19/15 0556  NA 146*  < > 138  K 4.5  < > 3.6  CL 105  < > 101  CO2 27  < > 31  GLUCOSE 109*  < > 171*  BUN 22*  < > 26*  CREATININE 0.91  < > 0.82  CALCIUM 9.5  < > 8.7*  AST 29  --   --   ALT 22  --   --   ALKPHOS 72  --   --   BILITOT 0.6  --   --   < > = values in this interval not displayed. ------------------------------------------------------------------------------------------------------------------  Cardiac Enzymes  Recent Labs Lab 01/17/15 1347 01/17/15 1854 01/18/15 0119  TROPONINI 0.05* 0.04* <0.03   ------------------------------------------------------------------------------------------------------------------  RADIOLOGY:  No results found. ASSESSMENT AND PLAN:  59 year old female with a history of COPD and chronic respiratory failure on 2 L of oxygen and sinus tachycardia who presents with COPD exacerbation.  1. COPD acute exacerbation: still with wheezing.   continue steroids (Solu-Medrol 60 mg IV every 8 hours), azithromycin, Symbicort, Spiriva. Continue O2, INH and NEBS. - third readmission for COPD exacerbation in last 6 months - initiate COPD Gold - await pulmo input - CT scan of the chest showed severe emphysema  2. Sinus tachycardia: Patient's heart rate is elevated, but she does have a history of sinus tachycardia.  3. Accelerated hypertension on Essential hypertension: Continue  lisinopril/HCTZ and metoprolol and when necessary hydralazine.  4. Rib pain likely from cough.  Tessalon Perles for coughing along with Robitussin syrup as needed   5.  GERD: Continue Protonix  and Maalox as needed,   6.  Borderline elevated troponin: likely due to supply demand ischemia  7. Constipation: on Dulcolax, miralax, senokot-s   Management plans discussed with the patient and she is in agreement.  CODE STATUS: FULL  TOTAL TIME TAKING CARE OF THIS PATIENT: 35 minutes.     POSSIBLE D/C 2-3 days, DEPENDING ON CLINICAL CONDITION. and pulmonary evaluation    [facility], Bowie Doiron M.D on 01/20/2015 at 10:27 AM  Between 7am to 6pm - Pager - [phone] After 6pm go to [email or web address] - password [email or web address] [place] [facility]  Office  [phone]  CC: Primary care physician; Marguerita Merles, MD  Note: This dictation was prepared with Dragon dictation along with smaller phrase technology. Any transcriptional errors that result from this process are unintentional.

## 2015-01-20 NOTE — Progress Notes (Signed)
Date: 01/20/2015,   MRN# [id number] Andrea Bell Jan 09, 1956 Code Status:     Code Status Orders        Start     Ordered   01/16/15 1242  Full code   Continuous     01/16/15 1241     Hosp day:@LENGTHOFSTAYDAYS @ Referring MD: @ATDPROV @        CC: copd exacerbation  HPI: This is a 59 year old lady, here with increase sob, cough, wheezing. Known to have stage III copd, obstructive sleep apnea, not on her cpap, struggling to loose weight and smokes. She denies angina, calf pain, swelling, syncope or recent travel. She has had tachycardia. Hser chest is sore from coughing. Her meds reviewed.   PMHX:   Past Medical History  Diagnosis Date  . COPD (chronic obstructive pulmonary disease) (HCC)     2l home oxygen  . GERD (gastroesophageal reflux disease)   . HTN (hypertension)   . Tachycardia   . Anemia   . Chronic back pain   . Osteoporosis   . Osteoarthritis    Surgical Hx:  Past Surgical History  Procedure Laterality Date  . Tear duct surgery      bilateral   Family Hx:  Family History  Problem Relation Age of Onset  . Breast cancer Maternal Aunt 57  . Breast cancer Maternal Grandmother 39  . Hypertension Mother   . Diabetes Mellitus II Mother   . Cancer - Other Father     brain tumor  . Dementia Mother    Social Hx:   Social History  Substance Use Topics  . Smoking status: Former Smoker    Quit date: 04/25/2014  . Smokeless tobacco: None  . Alcohol Use: 0.0 oz/week    0 Standard drinks or equivalent per week     Comment: occasional drinks   Medication:    Home Medication:  No current outpatient prescriptions on file.  Current Medication: @CURMEDTAB @   Allergies:  Review of patient's allergies indicates no known allergies.  Review of Systems: Gen:  Denies  fever, sweats, chills HEENT: Denies blurred vision, double vision, ear pain, eye pain, hearing loss, nose bleeds, sore throat Cvc:  No dizziness, chest pain or heaviness Resp:  Wheezing and  coughing, somewhat better since bieng here  Gi: Denies swallowing difficulty, stomach pain, nausea or vomiting, diarrhea, constipation, bowel incontinence Gu:  Denies bladder incontinence, burning urine Ext:   No Joint pain, stiffness or swelling Skin: No skin rash, easy bruising or bleeding or hives Endoc:  No polyuria, polydipsia , polyphagia or weight change Psych: No depression, insomnia or hallucinations  Other:  All other systems negative  Physical Examination:   VS: BP 123/68 mmHg  Pulse 69  Temp(Src) 97.5 F (36.4 C) (Oral)  Resp 10  Ht 5\' 7"  (1.702 m)  Wt 175 lb (79.379 kg)  BMI 27.40 kg/m2  SpO2 96%  General Appearance: No distress, sitting in chair, [place]ode[place] 02 on  Neuro/psych without focal findings, mental status, speech normal, alert and oriented, cranial nerves 2-12 intact, reflexes normal and symmetric, sensation grossly normal  HEENT: PERRLA, EOM intact, no ptosis Neck; Supple, no stridor Chest wheezing and rhonchus,     Cardiovascular:  Normal S1,S2.  No m/r/g.  Abdominal aorta pulsation normal.    Abdomen:Benign, Soft, non-tender, No masses, hepatosplenomegaly, No lymphadenopathy Endoc: No evident thyromegaly, no signs of acromegaly or Cushing features Skin:   warm, no rashes, no ecchymosis  Extremities: normal, no cyanosis, clubbing, no edema, warm with  normal capillary refill. Back, straight and non tender   Labs results:   Recent Labs     01/18/15  0119  01/19/15  0556  HGB  13.2  12.9  HCT  41.5  39.1  MCV  92.7  91.7  WBC  17.2*  12.0*  BUN   --   26*  CREATININE   --   0.82  GLUCOSE   --   171*  CALCIUM   --   8.7*  ,   Culture results:     Rad results:  COMPARISON: Chest radiograph 01/16/2015 and chest radiograph 02/06/2013.  FINDINGS: Heart size is within normal limits. Normal caliber thoracic aorta. Minimal focal areas of atherosclerotic calcification in the ascending thoracic aorta and at the origin of great vessels. No visible  coronary artery atherosclerotic calcification. Negative for supraclavicular, mediastinal, hilar, or axillary lymphadenopathy. Negative for pleural or pericardial abnormality. There is an air-fluid level in the upper thoracic esophagus. Otherwise esophagus is unremarkable.  The lungs are well expanded, aside from focal linear areas of atelectasis in the left lower lobe. The trachea and mainstem bronchi are patent.  Moderate centrilobular is emphysema is noted, affecting the upper lobes to the greatest degree. No interstitial abnormality, discrete nodule, or mass lesion. There is some focal scarring at the extreme right lung apex.  The stomach is normally positioned. Normal adrenal glands. Imaged portion of the liver, gallbladder, spleen, kidneys, and pancreas is unremarkable. Soft tissues of the body wall and breast included in the imaging field appear within normal limits.  Thoracic spine vertebral bodies are normal in height and alignment. There is endplate degenerative changes at L1-L2.  IMPRESSION: 1. Moderate centrilobular emphysema. 2. Mild left basilar atelectasis.    Assessment and Plan: She has stage III copd here with copd exacerbation. Still quite bronchospastic today. She is on broad copd coverage. Slowly improving.  -continue laba, ics, spiriva, albuterol, solumedrol today -increase activity -dvt prophylaxis -advised to stop smoking  Obstructive sleep apnea, not wearing her cpap -will encourage to get back on the cpap -weight loss -avoid sedation      I have personally obtained a history, examined the patient, evaluated laboratory and imaging results, formulated the assessment and plan and placed orders.  The Patient requires high complexity decision making for assessment and support, frequent evaluation and titration of therapies, application of advanced monitoring technologies and extensive interpretation of multiple databases.   Herbon  Fleming,M.D. Pulmonary & Critical care Medicine [facility]

## 2015-01-21 LAB — GLUCOSE, CAPILLARY
GLUCOSE-CAPILLARY: 121 mg/dL — AB (ref 65–99)
GLUCOSE-CAPILLARY: 122 mg/dL — AB (ref 65–99)
GLUCOSE-CAPILLARY: 177 mg/dL — AB (ref 65–99)
Glucose-Capillary: 210 mg/dL — ABNORMAL HIGH (ref 65–99)

## 2015-01-21 MED ORDER — THEOPHYLLINE ER 300 MG PO TB12
300.0000 mg | ORAL_TABLET | Freq: Two times a day (BID) | ORAL | Status: DC
  Administered 2015-01-21 – 2015-01-24 (×6): 300 mg via ORAL
  Filled 2015-01-21 (×9): qty 1

## 2015-01-21 NOTE — Progress Notes (Signed)
Patient is a new COPD Gold Member. [facility] has received consent to begin following her after she is discharged. They are unable to see her until [employer] has been initiated for patient. I have been unable to initiate [employer] as my username has expired. [employer] Support asked me to have my charge nurse or supervisor contact them to give me permission to refer patients to the [employer] program. My charge nurse is sending [employer] support an e-mail so that they can reinstate my username. Patient has received the COPD gold packet and her card # is [phone]. I have spoken to [place], CCM about this issue and he will pass this information on to the CCM coverage over the weekend. I will also pass this on to the next nurse in the event that my username is not reinstated by 1900 tonight.

## 2015-01-21 NOTE — Progress Notes (Signed)
[place] [facility] [place] NAME: Andrea Bell    MR#:  [id number]  DATE OF BIRTH:  May 16, 1955  SUBJECTIVE:  Very slow improvement. Still wheezing, sitting in chair.  REVIEW OF SYSTEMS:    Review of Systems  Constitutional: Negative for fever, chills and malaise/fatigue.  HENT: Negative for sore throat.   Eyes: Negative for blurred vision.  Respiratory: Positive for cough, shortness of breath and wheezing. Negative for hemoptysis.   Cardiovascular: Negative for chest pain, palpitations and leg swelling.  Gastrointestinal: Negative for nausea, vomiting, abdominal pain, diarrhea and blood in stool.  Genitourinary: Negative for dysuria.  Musculoskeletal: Negative for back pain.  Neurological: Negative for dizziness, tremors and headaches.  Endo/Heme/Allergies: Does not bruise/bleed easily.   Tolerating Diet: yes DRUG ALLERGIES:  No Known Allergies VITALS:  Blood pressure 157/72, pulse 66, temperature 97.7 F (36.5 C), temperature source Oral, resp. rate 18, height 5\' 7"  (1.702 m), weight 79.379 kg (175 lb), SpO2 95 %. PHYSICAL EXAMINATION:  Physical Exam  Constitutional: She is oriented to person, place, and time and well-developed, well-nourished, and in no distress. No distress.  HENT:  Head: Normocephalic.  Eyes: No scleral icterus.  Neck: Normal range of motion. Neck supple. No JVD present. No tracheal deviation present.  Cardiovascular: Normal rate, regular rhythm and normal heart sounds.  Exam reveals no gallop and no friction rub.   No murmur heard. Pulmonary/Chest: No respiratory distress. She has wheezes. She has no rales. She exhibits no tenderness.  Abdominal: Soft. Bowel sounds are normal. She exhibits no distension and no mass. There is no tenderness. There is no rebound and no guarding.  Musculoskeletal: Normal range of motion. She exhibits no edema.  Neurological: She is alert and oriented to person, place, and time.   Skin: Skin is warm. No rash noted. No erythema.  Psychiatric: Affect and judgment normal.   LABORATORY PANEL:   CBC  Recent Labs Lab 01/19/15 0556  WBC 12.0*  HGB 12.9  HCT 39.1  PLT 236   ------------------------------------------------------------------------------------------------------------------  Chemistries   Recent Labs Lab 01/16/15 1116  01/19/15 0556  NA 146*  < > 138  K 4.5  < > 3.6  CL 105  < > 101  CO2 27  < > 31  GLUCOSE 109*  < > 171*  BUN 22*  < > 26*  CREATININE 0.91  < > 0.82  CALCIUM 9.5  < > 8.7*  AST 29  --   --   ALT 22  --   --   ALKPHOS 72  --   --   BILITOT 0.6  --   --   < > = values in this interval not displayed. ------------------------------------------------------------------------------------------------------------------  Cardiac Enzymes  Recent Labs Lab 01/17/15 1347 01/17/15 1854 01/18/15 0119  TROPONINI 0.05* 0.04* <0.03   ------------------------------------------------------------------------------------------------------------------  RADIOLOGY:  No results found. ASSESSMENT AND PLAN:  59 year old female with a history of COPD and chronic respiratory failure on 2 L of oxygen and sinus tachycardia who presents with COPD exacerbation.  1. COPD acute exacerbation: still with wheezing although slowly improving. - continue theo-dur, spiriva, nasacort, duoneb, symbicort and tessalon pearls. - on solu-medrol 60 mg IV Q 8 hrs, stopped abx has finished 5 days course for bronchitis - 3rd readmission for COPD exacerbation in last 6 months - initiated COPD Gold this time. Good candidate for pulmo rehab (set up on D/C) - CT scan of the chest showed severe emphysema  2. Sinus tachycardia:  Patient's heart rate is much better controlled.   3. Accelerated hypertension on Essential hypertension: Continue  lisinopril/HCTZ and metoprolol and when necessary hydralazine.  4. Rib pain likely from cough.  Tessalon Perles for coughing  along with Robitussin syrup as needed   5.  GERD: Continue Protonix and Maalox as needed,   6.  Borderline elevated troponin: due to supply demand ischemia  7. Constipation: on Dulcolax, miralax, senokot-s   Management plans discussed with the patient and she is in agreement.  CODE STATUS: FULL CODE  TOTAL TIME TAKING CARE OF THIS PATIENT: 35 minutes.     POSSIBLE D/C 1-2 days, DEPENDING ON CLINICAL CONDITION.    [facility], Andrea Bell M.D on 01/21/2015 at 1:41 PM  Between 7am to 6pm - Pager - [phone] After 6pm go to [email or web address] - password [email or web address] [facility]  Office  [phone]  CC: Primary care physician; Andrea Merles, MD  Note: This dictation was prepared with Dragon dictation along with smaller phrase technology. Any transcriptional errors that result from this process are unintentional.

## 2015-01-21 NOTE — Progress Notes (Signed)
Date: 01/21/2015,   MRN# [id number] Andrea Bell 1955/05/01 Code Status:     Code Status Orders        Start     Ordered   01/16/15 1242  Full code   Continuous     01/16/15 1241     Hosp day:@LENGTHOFSTAYDAYS @ Referring MD: @ATDPROV @      HPI: still wheezing, couging, some better, dyspneic with min activity  PMHX:   Past Medical History  Diagnosis Date  . COPD (chronic obstructive pulmonary disease) (HCC)     2l home oxygen  . GERD (gastroesophageal reflux disease)   . HTN (hypertension)   . Tachycardia   . Anemia   . Chronic back pain   . Osteoporosis   . Osteoarthritis    Surgical Hx:  Past Surgical History  Procedure Laterality Date  . Tear duct surgery      bilateral   Family Hx:  Family History  Problem Relation Age of Onset  . Breast cancer Maternal Aunt 57  . Breast cancer Maternal Grandmother 24  . Hypertension Mother   . Diabetes Mellitus II Mother   . Cancer - Other Father     brain tumor  . Dementia Mother    Social Hx:   Social History  Substance Use Topics  . Smoking status: Former Smoker    Quit date: 04/25/2014  . Smokeless tobacco: None  . Alcohol Use: 0.0 oz/week    0 Standard drinks or equivalent per week     Comment: occasional drinks   Medication:    Home Medication:  No current outpatient prescriptions on file.  Current Medication: @CURMEDTAB @   Allergies:  Review of patient's allergies indicates no known allergies.  Review of Systems: Gen:  Denies  fever, sweats, chills HEENT: Denies blurred vision, double vision, ear pain, eye pain, hearing loss, nose bleeds, sore throat Cvc:  No dizziness, chest pain or heaviness Resp:   Still wheezing  and coughing Gi: Denies swallowing difficulty, stomach pain, nausea or vomiting, diarrhea, constipation, bowel incontinence Gu:  Denies bladder incontinence, burning urine Ext:   No Joint pain, stiffness or swelling Skin: No skin rash, easy bruising or bleeding or hives Endoc:  No  polyuria, polydipsia , polyphagia or weight change Psych: No depression, insomnia or hallucinations  Other:  All other systems negative  Physical Examination:   VS: BP 157/72 mmHg  Pulse 66  Temp(Src) 97.7 F (36.5 C) (Oral)  Resp 18  Ht 5\' 7"  (1.702 m)  Wt 175 lb (79.379 kg)  BMI 27.40 kg/m2  SpO2 95%  General Appearance: No distress  Neuro: without focal findings, mental status, speech normal, alert and oriented, cranial nerves 2-12 intact, reflexes normal and symmetric, sensation grossly normal  HEENT: PERRLA, EOM intact, no ptosis, no other lesions noticed, Mallampati: Pulmonary:.No wheezing, No rales  Sputum Production:   Cardiovascular:  Normal S1,S2.  No m/r/g.  Abdominal aorta pulsation normal.    Abdomen:Benign, Soft, non-tender, No masses, hepatosplenomegaly, No lymphadenopathy Endoc: No evident thyromegaly, no signs of acromegaly or Cushing features Skin:   warm, no rashes, no ecchymosis  Extremities: normal, no cyanosis, clubbing, no edema, warm with normal capillary refill.    Labs results:   Recent Labs     01/19/15  0556  HGB  12.9  HCT  39.1  MCV  91.7  WBC  12.0*  BUN  26*  CREATININE  0.82  GLUCOSE  171*  CALCIUM  8.7*  ,     Assessment and Plan:  She has stage III copd here with copd exacerbation. Still quite bronchospastic today. She is on broad copd coverage. Slowly improving.  -continue laba, ics, spiriva, albuterol, solumedrol today -add theodur 300 mg q 12hours -increase activity -dvt prophylaxis -advised to stay off cigarettes (quit 2 yrs ago)  Obstructive sleep apnea, not wearing her cpap -will encourage to get back on the cpap -weight loss -avoid sedation   I have personally obtained a history, examined the patient, evaluated laboratory and imaging results, formulated the assessment and plan and placed orders.  The Patient requires high complexity decision making for assessment and support, frequent evaluation and titration of  therapies, application of advanced monitoring technologies and extensive interpretation of multiple databases.   Marlyce Mcdougald,M.D. Pulmonary & Critical care Medicine [facility]

## 2015-01-21 NOTE — Care Management (Signed)
Spoke with patient for discharge planning. Patient is from home with husband drives occasionally. Denies issues with medication purchases. Sated has a nebulizer machine but denies having a CPAP. I not on BiPAP here. Stated that she has home O2 concentrator and portable. Unsure of DME provider. Stated that it was provided by Dr Leane Platt office. Has had Home health before but does not remember the agency. Has not been enrolled in COPD gold per patient. Discussed this with patient nurse Judson Roch who will provide patient with enrollment forms. Patient pleasant and up to chair and ambulates well in room. Patient has had 2 admissions in the last six months.  Do not anticipate HH or DME needs at this time.

## 2015-01-21 NOTE — Progress Notes (Signed)
Physical Therapy Treatment Patient Details Name: Andrea Bell MRN: [id number] DOB: 07-02-1955 Today's Date: 01/21/2015    History of Present Illness Patient is a 59 y.o. female with h/o COPD presenting to hospital with SOB, chest pain, and HA and admitted with COPD exacerbation.    PT Comments    Pt still SOB with activity but O2 sats >95% on 2 L/min O2 via nasal cannula with activity and ambulation around nursing loop.  Pt with increased lateral sway (CGA for safety) but no loss of balance noted with ambulation without AD requiring assist to steady.  Pt progressing well with functional mobility.  Plan for pt to discharge home when medically appropriate.  Anticipate with continued progress, pt will not need any further PT upon discharge.   Follow Up Recommendations  No PT follow up     Equipment Recommendations   (RW pending pt's progress)    Recommendations for Other Services       Precautions / Restrictions Precautions Precautions: Fall Restrictions Weight Bearing Restrictions: No    Mobility  Bed Mobility               General bed mobility comments: Deferred d/t pt sitting up in chair  Transfers Overall transfer level: Needs assistance Equipment used: None Transfers: Sit to/from Stand Sit to Stand: Supervision         General transfer comment: no loss of balance noted  Ambulation/Gait Ambulation/Gait assistance: Min guard Ambulation Distance (Feet): 350 Feet Assistive device: None       General Gait Details: increased lateral sway without AD but no loss of balance requiring assist to prevent fall; SOB noted with activity   Stairs            Wheelchair Mobility    Modified Rankin (Stroke Patients Only)       Balance   Sitting-balance support: No upper extremity supported;Feet supported Sitting balance-Leahy Scale: Normal     Standing balance support: No upper extremity supported Standing balance-Leahy Scale: Good                      Cognition Arousal/Alertness: Awake/alert Behavior During Therapy: WFL for tasks assessed/performed Overall Cognitive Status: Within Functional Limits for tasks assessed                      Exercises  Standing therapeutic exercise x10 reps B LE's with UE support:  Marching, hip abd/adduction, hip extension, hamstring curls, heelraises, and mini squats.  Pt required vc's and demo for correct technique with ex's.    General Comments   Nursing cleared pt for participation in physical therapy.  Pt agreeable to PT session.      Pertinent Vitals/Pain Pain Assessment: 0-10 Pain Score: 10-Worst pain ever Pain Location: chest pain from coughing (nursing aware) Pain Descriptors / Indicators: Tender;Constant;Aching;Sore Pain Intervention(s): Limited activity within patient's tolerance;Monitored during session;Premedicated before session    Home Living                      Prior Function            PT Goals (current goals can now be found in the care plan section) Acute Rehab PT Goals Patient Stated Goal: to go home PT Goal Formulation: With patient Time For Goal Achievement: 02/01/15 Potential to Achieve Goals: Good Progress towards PT goals: Progressing toward goals    Frequency  Min 2X/week    PT Plan Current plan remains appropriate  Co-evaluation             End of Session Equipment Utilized During Treatment: Gait belt;Oxygen Activity Tolerance:  (SOB noted with activity) Patient left: in chair;with call bell/phone within reach     Time: 1148-1205 PT Time Calculation (min) (ACUTE ONLY): 17 min  Charges:  $Therapeutic Exercise: 8-22 mins                    G CodesLeitha Bleak February 01, 2015, 1:33 PM Leitha Bleak, [place]

## 2015-01-22 ENCOUNTER — Inpatient Hospital Stay: Payer: Medicaid Other

## 2015-01-22 LAB — BASIC METABOLIC PANEL
ANION GAP: 4 — AB (ref 5–15)
BUN: 27 mg/dL — ABNORMAL HIGH (ref 6–20)
CALCIUM: 9 mg/dL (ref 8.9–10.3)
CHLORIDE: 98 mmol/L — AB (ref 101–111)
CO2: 33 mmol/L — AB (ref 22–32)
Creatinine, Ser: 0.77 mg/dL (ref 0.44–1.00)
GFR calc Af Amer: >60 mL/min (ref >60)
GFR calc non Af Amer: >60 mL/min (ref >60)
GLUCOSE: 152 mg/dL — AB (ref 65–99)
Potassium: 4.2 mmol/L (ref 3.5–5.1)
Sodium: 135 mmol/L (ref 135–145)

## 2015-01-22 LAB — CBC
HCT: 40.8 % (ref 35.0–47.0)
Hemoglobin: 13.4 g/dL (ref 12.0–16.0)
MCH: 30.5 pg (ref 26.0–34.0)
MCHC: 32.9 g/dL (ref 32.0–36.0)
MCV: 92.7 fL (ref 80.0–100.0)
Platelets: 240 10*3/uL (ref 150–440)
RBC: 4.41 MIL/uL (ref 3.80–5.20)
RDW: 13.6 % (ref 11.5–14.5)
WBC: 12.6 10*3/uL — AB (ref 3.6–11.0)

## 2015-01-22 LAB — GLUCOSE, CAPILLARY
GLUCOSE-CAPILLARY: 161 mg/dL — AB (ref 65–99)
GLUCOSE-CAPILLARY: 222 mg/dL — AB (ref 65–99)
Glucose-Capillary: 146 mg/dL — ABNORMAL HIGH (ref 65–99)
Glucose-Capillary: 157 mg/dL — ABNORMAL HIGH (ref 65–99)

## 2015-01-22 NOTE — Progress Notes (Addendum)
[place] at [place] NAME: Andrea Bell    MR#:  [id number]  DATE OF BIRTH:  12-Oct-1955  SUBJECTIVE:  Patient is out of bed to chair , reports little improvement which is very slow improvement. Still wheezing and coughing  REVIEW OF SYSTEMS:    Review of Systems  Constitutional: Negative for fever, chills and malaise/fatigue.  HENT: Negative for sore throat.   Eyes: Negative for blurred vision.  Respiratory: Positive for cough, shortness of breath and wheezing. Negative for hemoptysis.   Cardiovascular: Negative for chest pain, palpitations and leg swelling.  Gastrointestinal: Negative for nausea, vomiting, abdominal pain, diarrhea and blood in stool.  Genitourinary: Negative for dysuria.  Musculoskeletal: Negative for back pain.  Neurological: Negative for dizziness, tremors and headaches.  Endo/Heme/Allergies: Does not bruise/bleed easily.   Tolerating Diet: yes DRUG ALLERGIES:  No Known Allergies VITALS:  Blood pressure 138/70, pulse 64, temperature 98.1 F (36.7 C), temperature source Oral, resp. rate 18, height 5\' 7"  (1.702 m), weight 81.829 kg (180 lb 6.4 oz), SpO2 96 %. PHYSICAL EXAMINATION:  Physical Exam  Constitutional: She is oriented to person, place, and time and well-developed, well-nourished, and in no distress. No distress.  HENT:  Head: Normocephalic.  Eyes: No scleral icterus.  Neck: Normal range of motion. Neck supple. No JVD present. No tracheal deviation present.  Cardiovascular: Normal rate, regular rhythm and normal heart sounds.  Exam reveals no gallop and no friction rub.   No murmur heard. Pulmonary/Chest: No respiratory distress. She has wheezes. She has no rales. She exhibits no tenderness.  Abdominal: Soft. Bowel sounds are normal. She exhibits no distension and no mass. There is no tenderness. There is no rebound and no guarding.  Musculoskeletal: Normal range of motion. She exhibits no edema.   Neurological: She is alert and oriented to person, place, and time.  Skin: Skin is warm. No rash noted. No erythema.  Psychiatric: Affect and judgment normal.   LABORATORY PANEL:   CBC  Recent Labs Lab 01/22/15 0722  WBC 12.6*  HGB 13.4  HCT 40.8  PLT 240   ------------------------------------------------------------------------------------------------------------------  Chemistries   Recent Labs Lab 01/16/15 1116  01/22/15 0722  NA 146*  < > 135  K 4.5  < > 4.2  CL 105  < > 98*  CO2 27  < > 33*  GLUCOSE 109*  < > 152*  BUN 22*  < > 27*  CREATININE 0.91  < > 0.77  CALCIUM 9.5  < > 9.0  AST 29  --   --   ALT 22  --   --   ALKPHOS 72  --   --   BILITOT 0.6  --   --   < > = values in this interval not displayed. ------------------------------------------------------------------------------------------------------------------  Cardiac Enzymes  Recent Labs Lab 01/17/15 1347 01/17/15 1854 01/18/15 0119  TROPONINI 0.05* 0.04* <0.03   ------------------------------------------------------------------------------------------------------------------  RADIOLOGY:  Dg Chest 2 View  01/22/2015  CLINICAL DATA:  COPD exacerbation. EXAM: CHEST  2 VIEW COMPARISON:  Radiographs 01/16/2015.  CT 01/18/2015. FINDINGS: The heart size and mediastinal contours are stable. The lungs remain hyperinflated with stable chronic atelectasis or scarring at the left lung base. The right lung is clear. There is no pleural effusion or pneumothorax. The bones appear unchanged. IMPRESSION: Stable chest. Chronic obstructive pulmonary disease with chronic left lower lobe atelectasis or scarring. Electronically Signed   By: Richardean Sale M.D.   On: 01/22/2015 10:43  ASSESSMENT AND PLAN:  59 year old female with a history of COPD and chronic respiratory failure on 2 L of oxygen and sinus tachycardia who presents with COPD exacerbation.  1. Acute exacerbation of COPD :  slowly improving. -  continue theo-dur, spiriva, nasacort, duoneb, symbicort and tessalon pearls. - on solu-medrol 60 mg IV Q 8 hrs, stopped abx has finished 5 days course for bronchitis - 3rd readmission for COPD exacerbation in last 6 months - initiated COPD Gold this time.  OP f/u with  pulmo rehab (set up on D/C) - CT scan of the chest showed severe emphysema -Appreciate pulmonologist recommendations   2. Sinus tachycardia: Patient's heart rate is much better controlled.   3. Accelerated hypertension on Essential hypertension: Continue  lisinopril/HCTZ and metoprolol and when necessary hydralazine.  4. Rib pain likely from cough.  Tessalon Perles for coughing along with Robitussin syrup as needed   5.  GERD: Continue Protonix and Maalox as needed,   6.  Borderline elevated troponin: due to supply demand ischemia  7. Constipation: on Dulcolax, miralax, senokot-s  8. Obstructive sleep apnea Noncompliant with CPAP, will encourage the patient to use CPAP daily at bedtime  9. Deconditioning with physical therapy, consult is placed  Management plans discussed with the patient and she is in agreement.  CODE STATUS: FULL CODE  TOTAL TIME TAKING CARE OF THIS PATIENT: 35 minutes.     POSSIBLE D/C 1-2 days, DEPENDING ON CLINICAL CONDITION.    Nicholes Mango M.D on 01/22/2015 at 3:30 PM  Between 7am to 6pm - Pager - [phone] After 6pm go to [email or web address] - password [email or web address] [facility]  Office  [phone]  CC: Primary care physician; Marguerita Merles, MD  Note: This dictation was prepared with Dragon dictation along with smaller phrase technology. Any transcriptional errors that result from this process are unintentional.

## 2015-01-23 LAB — GLUCOSE, CAPILLARY
Glucose-Capillary: 134 mg/dL — ABNORMAL HIGH (ref 65–99)
Glucose-Capillary: 113 mg/dL — ABNORMAL HIGH (ref 65–99)
Glucose-Capillary: 112 mg/dL — ABNORMAL HIGH (ref 65–99)
Glucose-Capillary: 234 mg/dL — ABNORMAL HIGH (ref 65–99)

## 2015-01-23 LAB — THEOPHYLLINE LEVEL: Theophylline Lvl: 13.3 (ref 8.0–20.0)

## 2015-01-23 LAB — CREATININE, SERUM
CREATININE: 0.79 mg/dL (ref 0.44–1.00)
GFR calc Af Amer: >60 mL/min (ref >60)

## 2015-01-23 MED ORDER — IPRATROPIUM-ALBUTEROL 0.5-2.5 (3) MG/3ML IN SOLN
3.0000 mL | Freq: Four times a day (QID) | RESPIRATORY_TRACT | Status: DC
  Administered 2015-01-24 (×2): 3 mL via RESPIRATORY_TRACT
  Filled 2015-01-23 (×3): qty 3

## 2015-01-23 MED ORDER — METHYLPREDNISOLONE SODIUM SUCC 40 MG IJ SOLR
40.0000 mg | Freq: Two times a day (BID) | INTRAMUSCULAR | Status: DC
  Administered 2015-01-23 – 2015-01-24 (×2): 40 mg via INTRAVENOUS
  Filled 2015-01-23 (×2): qty 1

## 2015-01-23 NOTE — Progress Notes (Signed)
[place] at [place] NAME: Andrea Bell    MR#:  [id number]  DATE OF BIRTH:  01/11/1956  SUBJECTIVE:  Patient is out of bed to chair , reports significant improvement , denies any wheezing. Cough is improving. Hoping to go home tomorrow  REVIEW OF SYSTEMS:    Review of Systems  Constitutional: Negative for fever, chills and malaise/fatigue.  HENT: Negative for sore throat.   Eyes: Negative for blurred vision.  Respiratory: Positive for cough, shortness of breath and wheezing. Negative for hemoptysis.   Cardiovascular: Negative for chest pain, palpitations and leg swelling.  Gastrointestinal: Negative for nausea, vomiting, abdominal pain, diarrhea and blood in stool.  Genitourinary: Negative for dysuria.  Musculoskeletal: Negative for back pain.  Neurological: Negative for dizziness, tremors and headaches.  Endo/Heme/Allergies: Does not bruise/bleed easily.   Tolerating Diet: yes DRUG ALLERGIES:  No Known Allergies VITALS:  Blood pressure 100/88, pulse 65, temperature 98 F (36.7 C), temperature source Oral, resp. rate 18, height 5\' 7"  (1.702 m), weight 81.829 kg (180 lb 6.4 oz), SpO2 93 %. PHYSICAL EXAMINATION:  Physical Exam  Constitutional: She is oriented to person, place, and time and well-developed, well-nourished, and in no distress. No distress.  HENT:  Head: Normocephalic.  Eyes: No scleral icterus.  Neck: Normal range of motion. Neck supple. No JVD present. No tracheal deviation present.  Cardiovascular: Normal rate, regular rhythm and normal heart sounds.  Exam reveals no gallop and no friction rub.   No murmur heard. Pulmonary/Chest: No respiratory distress. She has wheezes. She has no rales. She exhibits no tenderness.  Abdominal: Soft. Bowel sounds are normal. She exhibits no distension and no mass. There is no tenderness. There is no rebound and no guarding.  Musculoskeletal: Normal range of motion. She exhibits  no edema.  Neurological: She is alert and oriented to person, place, and time.  Skin: Skin is warm. No rash noted. No erythema.  Psychiatric: Affect and judgment normal.   LABORATORY PANEL:   CBC  Recent Labs Lab 01/22/15 0722  WBC 12.6*  HGB 13.4  HCT 40.8  PLT 240   ------------------------------------------------------------------------------------------------------------------  Chemistries   Recent Labs Lab 01/22/15 0722 01/23/15 0802  NA 135  --   K 4.2  --   CL 98*  --   CO2 33*  --   GLUCOSE 152*  --   BUN 27*  --   CREATININE 0.77 0.79  CALCIUM 9.0  --    ------------------------------------------------------------------------------------------------------------------  Cardiac Enzymes  Recent Labs Lab 01/17/15 1347 01/17/15 1854 01/18/15 0119  TROPONINI 0.05* 0.04* <0.03   ------------------------------------------------------------------------------------------------------------------  RADIOLOGY:  Dg Chest 2 View  01/22/2015  CLINICAL DATA:  COPD exacerbation. EXAM: CHEST  2 VIEW COMPARISON:  Radiographs 01/16/2015.  CT 01/18/2015. FINDINGS: The heart size and mediastinal contours are stable. The lungs remain hyperinflated with stable chronic atelectasis or scarring at the left lung base. The right lung is clear. There is no pleural effusion or pneumothorax. The bones appear unchanged. IMPRESSION: Stable chest. Chronic obstructive pulmonary disease with chronic left lower lobe atelectasis or scarring. Electronically Signed   By: Richardean Sale M.D.   On: 01/22/2015 10:43   ASSESSMENT AND PLAN:  59 year old female with a history of COPD and chronic respiratory failure on 2 L of oxygen and sinus tachycardia who presents with COPD exacerbation.  1. Acute exacerbation of COPD :  slowly improving. - continue theo-dur, spiriva, nasacort, duoneb, symbicort and tessalon pearls. -Check theophylline levels -  Taper solu-medrol 40 mg IV Q 12 hrs, stopped abx has  finished 5 days course for bronchitis - 3rd readmission for COPD exacerbation in last 6 months - initiated COPD Gold this time.  OP f/u with  pulmo rehab (set up on D/C) - CT scan of the chest showed severe emphysema -Appreciate pulmonologist recommendations outpatient follow-up with Dr. Raul Del -Check ambulatory pulse ox-   2. Sinus tachycardia: Patient's heart rate is much better controlled.   3. Accelerated hypertension on Essential hypertension: Resolved   Continue  lisinopril/HCTZ and metoprolol and when necessary hydralazine.  4. Rib pain likely from cough.  Tessalon Perles for coughing along with Robitussin syrup as needed   5.  GERD: Continue Protonix and Maalox as needed,   6.  Borderline elevated troponin: due to supply demand ischemia  7. Constipation: on Dulcolax, miralax, senokot-s  8. Obstructive sleep apnea Noncompliant with CPAP, will encourage the patient to use CPAP daily at bedtime  9. Deconditioning with physical therapy, consult is placed  Management plans discussed with the patient and she is in agreement.  CODE STATUS: FULL CODE  TOTAL TIME TAKING CARE OF THIS PATIENT: 35 minutes.     POSSIBLE D/C 1-2 days, DEPENDING ON CLINICAL CONDITION.    Nicholes Mango M.D on 01/23/2015 at 1:43 PM  Between 7am to 6pm - Pager - [phone] After 6pm go to [email or web address] - password [email or web address] [facility]  Office  [phone]  CC: Primary care physician; Marguerita Merles, MD  Note: This dictation was prepared with Dragon dictation along with smaller phrase technology. Any transcriptional errors that result from this process are unintentional.

## 2015-01-24 LAB — GLUCOSE, CAPILLARY
GLUCOSE-CAPILLARY: 141 mg/dL — AB (ref 65–99)
Glucose-Capillary: 156 mg/dL — ABNORMAL HIGH (ref 65–99)

## 2015-01-24 MED ORDER — SENNOSIDES-DOCUSATE SODIUM 8.6-50 MG PO TABS: 1.0000 | ORAL_TABLET | Freq: Two times a day (BID) | ORAL | Status: DC | PRN

## 2015-01-24 MED ORDER — THEOPHYLLINE ER 300 MG PO TB12: 300.0000 mg | ORAL_TABLET | Freq: Two times a day (BID) | ORAL | Status: DC

## 2015-01-24 MED ORDER — GUAIFENESIN-CODEINE 100-10 MG/5ML PO SOLN
10.0000 mL | Freq: Four times a day (QID) | ORAL | Status: DC
Start: 2015-01-24 — End: 2015-03-30

## 2015-01-24 MED ORDER — PREDNISONE 10 MG (21) PO TBPK: 10.0000 mg | ORAL_TABLET | Freq: Every day | ORAL | Status: DC

## 2015-01-24 MED ORDER — ALPRAZOLAM 0.25 MG PO TABS: 0.2500 mg | ORAL_TABLET | Freq: Two times a day (BID) | ORAL | Status: DC | PRN

## 2015-01-24 NOTE — Progress Notes (Signed)
Pt stable. IV removed. D/c instructions given and education provided. Signed prescriptions given. Follow-up offices for patient were contacted. Two follow-up appointments scheduled; one pending, waiting on call back from pulm rehab. Pt refused to wait until follow-up appointment scheduled. This was explained to patient. Patient willing to leave hospital without third follow-up appointment scheduled. The pulm rehab will attempt to reach patient at home. Pt escorted out.

## 2015-01-24 NOTE — Discharge Instructions (Signed)
Activity as tolerated with 2 L of oxygen via nasal cannula Diet low-salt Use CPAP daily at bedtime Follow-up with primary care physician in a week Follow-up with pulmonology Dr. Raul Del in a week Outpatient follow-up with pulmonary rehabilitation in 3 days

## 2015-01-24 NOTE — Discharge Summary (Signed)
[facility] at [place] NAME: Andrea Bell    MR#:  [id number]  DATE OF BIRTH:  [birth year]  DATE OF ADMISSION:  01/16/2015 ADMITTING PHYSICIAN: Bettey Costa, MD  DATE OF DISCHARGE:01/24/2015  PRIMARY CARE PHYSICIAN: Marguerita Merles, MD    ADMISSION DIAGNOSIS:  COPD exacerbation ([facility]) [J44.1]  DISCHARGE DIAGNOSIS:  Active Problems:   COPD (chronic obstructive pulmonary disease) ([facility])   SECONDARY DIAGNOSIS:   Past Medical History  Diagnosis Date  . COPD (chronic obstructive pulmonary disease) ([facility])     2l home oxygen  . GERD (gastroesophageal reflux disease)   . HTN (hypertension)   . Tachycardia   . Anemia   . Chronic back pain   . Osteoporosis   . Osteoarthritis     HOSPITAL COURSE:   59 year old female with a history of COPD and chronic respiratory failure on 2 L of oxygen and sinus tachycardia who presents with COPD exacerbation.  1. Acute exacerbation of COPD : slow improvement clinically - continue theo-dur, spiriva, nasacort, duoneb, symbicort and tessalon pearls. - theophylline levels at 13.3-normal range, primary care physician pulmonology to consider checking theophylline levels periodically -Tapered solu-medrol and patient is getting discharged with prednisone tapering dose. stopped abx has finished 5 days course for bronchitis - 3rd readmission for COPD exacerbation in last 6 months - initiated COPD Gold this time.  OP f/u with pulmo rehab (set up on D/C) - CT scan of the chest showed severe emphysema -Appreciate pulmonologist recommendations outpatient follow-up with Dr. Raul Del -Continue 2 L of home oxygen via nasal cannula and continue CPAP daily at bedtime   2. Sinus tachycardia: Improved. Patient's heart rate is much better controlled.   3. Accelerated hypertension on Essential hypertension: Resolved  Continue lisinopril/HCTZ and metoprolol her home medication  4. Rib pain likely from cough.  Improved with cough syrup. Continue  Robitussin syrup as needed   5. GERD: Continue Protonix and Maalox as needed,   6. Borderline elevated troponin: due to supply demand ischemia  7. Constipation: Improved , plan is to continue Senokot over-the-counter as needed basis on Dulcolax, miralax, senokot-s  8. Obstructive sleep apnea Noncompliant with CPAP,  encouraged the patient to use CPAP daily at bedtime  9. Deconditioning with physical therapy-no PT follow-up needed  Management plans discussed with the patient and she is in agreement.  DISCHARGE CONDITIONS:   fair  CONSULTS OBTAINED:  Treatment Team:  Erby Pian, MD Nicholes Mango, MD   PROCEDURES none  DRUG ALLERGIES:  No Known Allergies  DISCHARGE MEDICATIONS:   Current Discharge Medication List    CONTINUE these medications which have NOT CHANGED   Details  albuterol (PROAIR HFA) 108 (90 BASE) MCG/ACT inhaler Inhale 2 puffs into the lungs every 6 (six) hours as needed. For wheezing.    aspirin EC 81 MG tablet Take 81 mg by mouth daily.    benzonatate (TESSALON) 200 MG capsule Take 1 capsule (200 mg total) by mouth 3 (three) times daily. Qty: 20 capsule, Refills: 0    budesonide-formoterol (SYMBICORT) 160-4.5 MCG/ACT inhaler Inhale 2 puffs into the lungs 2 (two) times daily.    esomeprazole (NEXIUM) 40 MG capsule Take 40 mg by mouth daily.    guaiFENesin-codeine 100-10 MG/5ML syrup Take 10 mLs by mouth every 6 (six) hours. Qty: 118 mL, Refills: 0    ipratropium-albuterol (DUONEB) 0.5-2.5 (3) MG/3ML SOLN Inhale 3 mLs into the lungs 4 (four) times daily as needed. For wheezing.    ketorolac (  TORADOL) 10 MG tablet Take 10 mg by mouth every 6 (six) hours as needed. For pain.    lisinopril-hydrochlorothiazide (PRINZIDE,ZESTORETIC) 10-12.5 MG tablet Take 1 tablet by mouth daily.    loratadine (CLARITIN) 10 MG tablet Take 10 mg by mouth daily.    meclizine (ANTIVERT) 25 MG tablet Take 25 mg by mouth 3 (three)  times daily as needed. For dizziness.    metoprolol succinate (TOPROL-XL) 25 MG 24 hr tablet Take 1 tablet (25 mg total) by mouth daily. Qty: 30 tablet, Refills: 2    Olopatadine HCl 0.2 % SOLN Apply 1 drop to eye daily.    tiotropium (SPIRIVA) 18 MCG inhalation capsule Place 18 mcg into inhaler and inhale daily.    tiZANidine (ZANAFLEX) 4 MG tablet Take 4 mg by mouth 3 (three) times daily as needed. For muscle spasms.    traMADol (ULTRAM) 50 MG tablet Take 1 tablet (50 mg total) by mouth every 6 (six) hours as needed. Qty: 20 tablet, Refills: 0    traZODone (DESYREL) 100 MG tablet Take 100 mg by mouth at bedtime.    triamcinolone (NASACORT) 55 MCG/ACT AERO nasal inhaler Place 2 sprays into the nose daily.         DISCHARGE INSTRUCTIONS:   Activity as tolerated with 2 L of oxygen via nasal cannula Diet low-salt Use CPAP daily at bedtime Follow-up with primary care physician in a week Follow-up with pulmonology Dr. Raul Del in a week Outpatient follow-up with pulmonary rehabilitation in 3 days   DIET:   Low-salt DISCHARGE CONDITION:  Fair  ACTIVITY:  Activity as tolerated  OXYGEN:  Home Oxygen: Yes.     Oxygen Delivery: 2 liters/min via Patient connected to nasal cannula oxygen Continue CPAP daily at bedtime DISCHARGE LOCATION:  home   If you experience worsening of your admission symptoms, develop shortness of breath, life threatening emergency, suicidal or homicidal thoughts you must seek medical attention immediately by calling 911 or calling your MD immediately  if symptoms less severe.  You Must read complete instructions/literature along with all the possible adverse reactions/side effects for all the Medicines you take and that have been prescribed to you. Take any new Medicines after you have completely understood and accpet all the possible adverse reactions/side effects.   Please note  You were cared for by a hospitalist during your hospital stay. If you  have any questions about your discharge medications or the care you received while you were in the hospital after you are discharged, you can call the unit and asked to speak with the hospitalist on call if the hospitalist that took care of you is not available. Once you are discharged, your primary care physician will handle any further medical issues. Please note that NO REFILLS for any discharge medications will be authorized once you are discharged, as it is imperative that you return to your primary care physician (or establish a relationship with a primary care physician if you do not have one) for your aftercare needs so that they can reassess your need for medications and monitor your lab values.     Today  Chief Complaint  Patient presents with  . Shortness of Breath   Patient is feeling much better. Denies any chest tightness or shortness of breath. Denies any wheezing. Lives on 2 L of home oxygen via nasal cannula and wants to be discharged home today  ROS:  CONSTITUTIONAL: Denies fevers, chills. Denies any fatigue, weakness.  EYES: Denies blurry vision, double vision, eye  pain. EARS, NOSE, THROAT: Denies tinnitus, ear pain, hearing loss. RESPIRATORY: Denies cough, wheeze, shortness of breath.  CARDIOVASCULAR: Denies chest pain, palpitations, edema.  GASTROINTESTINAL: Denies nausea, vomiting, diarrhea, abdominal pain. Denies bright red blood per rectum. GENITOURINARY: Denies dysuria, hematuria. ENDOCRINE: Denies nocturia or thyroid problems. HEMATOLOGIC AND LYMPHATIC: Denies easy bruising or bleeding. SKIN: Denies rash or lesion. MUSCULOSKELETAL: Denies pain in neck, back, shoulder, knees, hips or arthritic symptoms.  NEUROLOGIC: Denies paralysis, paresthesias.  PSYCHIATRIC: Denies anxiety or depressive symptoms.   VITAL SIGNS:  Blood pressure 112/65, pulse 69, temperature 98.1 F (36.7 C), temperature source Oral, resp. rate 16, height 5\' 7"  (1.702 m), weight 81.829 kg (180  lb 6.4 oz), SpO2 95 %.  I/O:   Intake/Output Summary (Last 24 hours) at 01/24/15 1219 Last data filed at 01/24/15 1133  Gross per 24 hour  Intake    480 ml  Output   2750 ml  Net  -2270 ml    PHYSICAL EXAMINATION:  GENERAL:  59 y.o.-year-old patient lying in the bed with no acute distress.  EYES: Pupils equal, round, reactive to light and accommodation. No scleral icterus. Extraocular muscles intact.  HEENT: Head atraumatic, normocephalic. Oropharynx and nasopharynx clear.  NECK:  Supple, no jugular venous distention. No thyroid enlargement, no tenderness.  LUNGS: Normal breath sounds bilaterally, no wheezing, rales,rhonchi or crepitation. No use of accessory muscles of respiration.  CARDIOVASCULAR: S1, S2 normal. No murmurs, rubs, or gallops.  ABDOMEN: Soft, non-tender, non-distended. Bowel sounds present. No organomegaly or mass.  EXTREMITIES: No pedal edema, cyanosis, or clubbing.  NEUROLOGIC: Cranial nerves II through XII are intact. Muscle strength 5/5 in all extremities. Sensation intact. Gait not checked.  PSYCHIATRIC: The patient is alert and oriented x 3.  SKIN: No obvious rash, lesion, or ulcer.   DATA REVIEW:   CBC  Recent Labs Lab 01/22/15 0722  WBC 12.6*  HGB 13.4  HCT 40.8  PLT 240    Chemistries   Recent Labs Lab 01/22/15 0722 01/23/15 0802  NA 135  --   K 4.2  --   CL 98*  --   CO2 33*  --   GLUCOSE 152*  --   BUN 27*  --   CREATININE 0.77 0.79  CALCIUM 9.0  --     Cardiac Enzymes  Recent Labs Lab 01/18/15 0119  TROPONINI <0.03    Microbiology Results  Results for orders placed or performed in visit on 02/05/13  Culture, blood (single)     Status: None   Collection Time: 02/05/13  7:17 AM  Result Value Ref Range Status   Micro Text Report   Final       COMMENT                   NO GROWTH AEROBICALLY/ANAEROBICALLY IN 5 DAYS   ANTIBIOTIC                                                      Culture, blood (single)     Status: None    Collection Time: 02/05/13  7:38 AM  Result Value Ref Range Status   Micro Text Report   Final       COMMENT                   NO GROWTH AEROBICALLY/ANAEROBICALLY IN  5 DAYS   ANTIBIOTIC                                                      Influenza A&B Antigens [facility])     Status: None   Collection Time: 02/06/13  7:20 AM  Result Value Ref Range Status   Micro Text Report   Final       COMMENT                   NEGATIVE FOR INFLUENZA A (ANTIGEN ABSENT)   COMMENT                   NEGATIVE FOR INFLUENZA B (ANTIGEN ABSENT)   ANTIBIOTIC                                                        RADIOLOGY:  Dg Chest 2 View  01/22/2015  CLINICAL DATA:  COPD exacerbation. EXAM: CHEST  2 VIEW COMPARISON:  Radiographs 01/16/2015.  CT 01/18/2015. FINDINGS: The heart size and mediastinal contours are stable. The lungs remain hyperinflated with stable chronic atelectasis or scarring at the left lung base. The right lung is clear. There is no pleural effusion or pneumothorax. The bones appear unchanged. IMPRESSION: Stable chest. Chronic obstructive pulmonary disease with chronic left lower lobe atelectasis or scarring. Electronically Signed   By: Richardean Sale M.D.   On: 01/22/2015 10:43    EKG:   Orders placed or performed during the hospital encounter of 01/16/15  . EKG test  . EKG test  . EKG 12-Lead  . EKG 12-Lead  . EKG 12-Lead  . EKG 12-Lead  . EKG 12-Lead  . EKG 12-Lead      Management plans discussed with the patient, family and they are in agreement.  CODE STATUS:     Code Status Orders        Start     Ordered   01/16/15 1242  Full code   Continuous     01/16/15 1241      TOTAL TIME TAKING CARE OF THIS PATIENT:45 minutes.    @[facility] @  on 01/24/2015 at 12:19 PM  Between 7am to 6pm - Pager - [phone]  After 6pm go to [email or web address] - password [email or web address] [facility]  Office  [phone]  CC: Primary care physician; Marguerita Merles,  MD

## 2015-03-30 ENCOUNTER — Ambulatory Visit (INDEPENDENT_AMBULATORY_CARE_PROVIDER_SITE_OTHER): Payer: Medicaid Other | Admitting: Family Medicine

## 2015-03-30 ENCOUNTER — Encounter: Payer: Self-pay | Admitting: Family Medicine

## 2015-03-30 VITALS — BP 135/87 | HR 109 | Temp 97.0°F | Ht 66.0 in | Wt 187.0 lb

## 2015-03-30 DIAGNOSIS — G4700 Insomnia, unspecified: Secondary | ICD-10-CM

## 2015-03-30 DIAGNOSIS — R739 Hyperglycemia, unspecified: Secondary | ICD-10-CM | POA: Diagnosis not present

## 2015-03-30 DIAGNOSIS — I10 Essential (primary) hypertension: Secondary | ICD-10-CM

## 2015-03-30 DIAGNOSIS — J432 Centrilobular emphysema: Secondary | ICD-10-CM

## 2015-03-30 DIAGNOSIS — J309 Allergic rhinitis, unspecified: Secondary | ICD-10-CM | POA: Diagnosis not present

## 2015-03-30 DIAGNOSIS — J3089 Other allergic rhinitis: Secondary | ICD-10-CM

## 2015-03-30 DIAGNOSIS — M1990 Unspecified osteoarthritis, unspecified site: Secondary | ICD-10-CM | POA: Diagnosis not present

## 2015-03-30 DIAGNOSIS — T380X5A Adverse effect of glucocorticoids and synthetic analogues, initial encounter: Secondary | ICD-10-CM

## 2015-03-30 NOTE — Patient Instructions (Addendum)
Don't use any salt substitutes It can be dangerous with the blood pressure pill that you take Your goal blood pressure is less than 140 mmHg on top. Try to follow the DASH guidelines (DASH stands for Dietary Approaches to Stop Hypertension) Try to limit the sodium in your diet.  Ideally, consume less than 1.5 grams (less than 1,500mg ) per day. Do not add salt when cooking or at the table.  Check the sodium amount on labels when shopping, and choose items lower in sodium when given a choice. Avoid or limit foods that already contain a lot of sodium. Eat a diet rich in fruits and vegetables and whole grains. Decrease your esomeprazole as follows: Skip one day the first week Then skip two days the second week Then skip three days the third week and so on until you are off of this pill Avoid triggers Try turmeric as a natural anti-inflammatory (for pain and arthritis). It comes in capsules where you buy aspirin and fish oil, but also as a spice where you buy pepper and garlic powder.   Gastroesophageal Reflux Disease, Adult Normally, food travels down the esophagus and stays in the stomach to be digested. However, when a person has gastroesophageal reflux disease (GERD), food and stomach acid move back up into the esophagus. When this happens, the esophagus becomes sore and inflamed. Over time, GERD can create small holes (ulcers) in the lining of the esophagus.  CAUSES This condition is caused by a problem with the muscle between the esophagus and the stomach (lower esophageal sphincter, or LES). Normally, the LES muscle closes after food passes through the esophagus to the stomach. When the LES is weakened or abnormal, it does not close properly, and that allows food and stomach acid to go back up into the esophagus. The LES can be weakened by certain dietary substances, medicines, and medical conditions, including:  Tobacco use.  Pregnancy.  Having a hiatal hernia.  Heavy alcohol  use.  Certain foods and beverages, such as coffee, chocolate, onions, and peppermint. RISK FACTORS This condition is more likely to develop in:  People who have an increased body weight.  People who have connective tissue disorders.  People who use NSAID medicines. SYMPTOMS Symptoms of this condition include:  Heartburn.  Difficult or painful swallowing.  The feeling of having a lump in the throat.  Abitter taste in the mouth.  Bad breath.  Having a large amount of saliva.  Having an upset or bloated stomach.  Belching.  Chest pain.  Shortness of breath or wheezing.  Ongoing (chronic) cough or a night-time cough.  Wearing away of tooth enamel.  Weight loss. Different conditions can cause chest pain. Make sure to see your health care provider if you experience chest pain. DIAGNOSIS Your health care provider will take a medical history and perform a physical exam. To determine if you have mild or severe GERD, your health care provider may also monitor how you respond to treatment. You may also have other tests, including:  An endoscopy toexamine your stomach and esophagus with a small camera.  A test thatmeasures the acidity level in your esophagus.  A test thatmeasures how much pressure is on your esophagus.  A barium swallow or modified barium swallow to show the shape, size, and functioning of your esophagus. TREATMENT The goal of treatment is to help relieve your symptoms and to prevent complications. Treatment for this condition may vary depending on how severe your symptoms are. Your health care provider may  recommend:  Changes to your diet.  Medicine.  Surgery. HOME CARE INSTRUCTIONS Diet  Follow a diet as recommended by your health care provider. This may involve avoiding foods and drinks such as:  Coffee and tea (with or without caffeine).  Drinks that containalcohol.  Energy drinks and sports drinks.  Carbonated drinks or  sodas.  Chocolate and cocoa.  Peppermint and mint flavorings.  Garlic and onions.  Horseradish.  Spicy and acidic foods, including peppers, chili powder, curry powder, vinegar, hot sauces, and barbecue sauce.  Citrus fruit juices and citrus fruits, such as oranges, lemons, and limes.  Tomato-based foods, such as red sauce, chili, salsa, and pizza with red sauce.  Fried and fatty foods, such as donuts, french fries, potato chips, and high-fat dressings.  High-fat meats, such as hot dogs and fatty cuts of red and white meats, such as rib eye steak, sausage, ham, and bacon.  High-fat dairy items, such as whole milk, butter, and cream cheese.  Eat small, frequent meals instead of large meals.  Avoid drinking large amounts of liquid with your meals.  Avoid eating meals during the 2-3 hours before bedtime.  Avoid lying down right after you eat.  Do not exercise right after you eat. General Instructions  Pay attention to any changes in your symptoms.  Take over-the-counter and prescription medicines only as told by your health care provider. Do not take aspirin, ibuprofen, or other NSAIDs unless your health care provider told you to do so.  Do not use any tobacco products, including cigarettes, chewing tobacco, and e-cigarettes. If you need help quitting, ask your health care provider.  Wear loose-fitting clothing. Do not wear anything tight around your waist that causes pressure on your abdomen.  Raise (elevate) the head of your bed 6 inches (15cm).  Try to reduce your stress, such as with yoga or meditation. If you need help reducing stress, ask your health care provider.  If you are overweight, reduce your weight to an amount that is healthy for you. Ask your health care provider for guidance about a safe weight loss goal.  Keep all follow-up visits as told by your health care provider. This is important. SEEK MEDICAL CARE IF:  You have new symptoms.  You have  unexplained weight loss.  You have difficulty swallowing, or it hurts to swallow.  You have wheezing or a persistent cough.  Your symptoms do not improve with treatment.  You have a hoarse voice. SEEK IMMEDIATE MEDICAL CARE IF:  You have pain in your arms, neck, jaw, teeth, or back.  You feel sweaty, dizzy, or light-headed.  You have chest pain or shortness of breath.  You vomit and your vomit looks like blood or coffee grounds.  You faint.  Your stool is bloody or black.  You cannot swallow, drink, or eat.   This information is not intended to replace advice given to you by your health care provider. Make sure you discuss any questions you have with your health care provider.   Document Released: 11/15/2004 Document Revised: 10/27/2014 Document Reviewed: 06/02/2014 Elsevier Interactive Patient Education [employer].

## 2015-03-30 NOTE — Progress Notes (Signed)
BP 135/87 mmHg  Pulse 109  Temp(Src) 97 F (36.1 C)  Ht 5\' 6"  (1.676 m)  Wt 187 lb (84.823 kg)  BMI 30.20 kg/m2  SpO2 95%   Subjective:    Patient ID: Andrea Bell, female    DOB: 22-Sep-1955, 60 y.o.   MRN: [id number]  HPI: Andrea Bell is a 60 y.o. female  Chief Complaint  Patient presents with  . Establish Care  . COPD    she is currently in a COPD study and has 2 unnamed meds, one is a "study medicine" and the other is a "rescue" medicine.    She has had asthma since her 49s; hard time to breathe; seeing Dr. Raul Del; oldest brother has it too; grew up around smoke; husband not smoking around her or in the house at all  Nasal spray, takes for allergies year-round, dust and pollen; itchy and watery eyes   Trazodone for insomnia; taking it for a year; not all the time, just as needed, 2-3 nights a week; coffee in the morning  Tramadol; fell a while back and it was for her knees; just takes it as needed; not even every month; filled in November 2016, Dr. Tressia Miners, 20 pills filled and 12 left  Tizanidine; takes that as needed; back and legs; she was seeing specialist; she has arthritis in her back; used to do a lot of lifting, last xrays was last year; did take one this morning  Theophyllline; Rx'd by Dr. Raul Del; goes to see them Monday; started this just in December  Senna; regular medicine to help constipation; coffee helps; drinks water  Oxygen; supposed to be on 24/7, not needing right now; 2 l/m continuous, Dr. Raul Del  Olopatadine for allergic rhinitis  Metoprolol succinate; was seeing a heart specialist, long time ago; skipped heart beats; one 12.5 mg daily  Meloxicam; arthritis  Meclizine, not taking now  Loratidine for allergies, year round pollen and dust  Lisinopril-hctz; HTN, had it for 4-5 years, runs in the family;  Hydrocodone 5/325 prescribed August 2016, Dr. Lennox Grumbles, 20 pills prescribed, six left  HRT; last mammogram was April or May, 2016; goes  every year  Esomeprazole; no ulcers, no GI bleeding pulmicort Dr. Evette Georges perles; not taking now  Aspirin EC 81 mg for 3 years  Alprazolam 0.25 mg, prescribed in the hospital by Dr. Margaretmary Eddy, 13 pills left out of 20 pills prescribed; she says you can tell I don't take it much  Relevant past medical, surgical, family and social history reviewed and updated as indicated. Has carpal tunnel and uses braces  We reviewed labs and glucoses in the hospital were really high; she can return next week Mother and brother both with diabetes  Allergies and medications reviewed and updated.  Review of Systems  Per HPI unless specifically indicated above     Objective:    BP 135/87 mmHg  Pulse 109  Temp(Src) 97 F (36.1 C)  Ht 5\' 6"  (1.676 m)  Wt 187 lb (84.823 kg)  BMI 30.20 kg/m2  SpO2 95%  Wt Readings from Last 3 Encounters:  04/06/15 186 lb (84.369 kg)  03/30/15 187 lb (84.823 kg)  01/22/15 180 lb 6.4 oz (81.829 kg)    Physical Exam  Constitutional: She appears well-developed and well-nourished. No distress.  HENT:  Head: Normocephalic and atraumatic.  Eyes: EOM are normal. No scleral icterus.  Neck: No JVD present.  Cardiovascular: Normal rate and regular rhythm.   Pulmonary/Chest: Effort normal and breath sounds normal.  Abdominal: She exhibits no distension.  Musculoskeletal: She exhibits no edema.  Neurological: She is alert.  Skin: No pallor.  Psychiatric: She has a normal mood and affect.    Results for orders placed or performed during the hospital encounter of 01/16/15  CBC  Result Value Ref Range   WBC 11.1 (H) 3.6 - 11.0 K/uL   RBC 4.70 3.80 - 5.20 MIL/uL   Hemoglobin 14.2 12.0 - 16.0 g/dL   HCT 42.9 35.0 - 47.0 %   MCV 91.3 80.0 - 100.0 fL   MCH 30.1 26.0 - 34.0 pg   MCHC 33.0 32.0 - 36.0 g/dL   RDW 13.6 11.5 - 14.5 %   Platelets 261 150 - 440 K/uL  Troponin I  Result Value Ref Range   Troponin I <0.03 <0.031 ng/mL  Comprehensive metabolic  panel  Result Value Ref Range   Sodium 146 (H) 135 - 145 mmol/L   Potassium 4.5 3.5 - 5.1 mmol/L   Chloride 105 101 - 111 mmol/L   CO2 27 22 - 32 mmol/L   Glucose, Bld 109 (H) 65 - 99 mg/dL   BUN 22 (H) 6 - 20 mg/dL   Creatinine, Ser 0.91 0.44 - 1.00 mg/dL   Calcium 9.5 8.9 - 10.3 mg/dL   Total Protein 7.8 6.5 - 8.1 g/dL   Albumin 4.2 3.5 - 5.0 g/dL   AST 29 15 - 41 U/L   ALT 22 14 - 54 U/L   Alkaline Phosphatase 72 38 - 126 U/L   Total Bilirubin 0.6 0.3 - 1.2 mg/dL   GFR calc non Af Amer >60 >60 mL/min   GFR calc Af Amer >60 >60 mL/min   Anion gap 14 5 - 15  Basic metabolic panel  Result Value Ref Range   Sodium 139 135 - 145 mmol/L   Potassium 3.8 3.5 - 5.1 mmol/L   Chloride 100 (L) 101 - 111 mmol/L   CO2 29 22 - 32 mmol/L   Glucose, Bld 172 (H) 65 - 99 mg/dL   BUN 23 (H) 6 - 20 mg/dL   Creatinine, Ser 0.93 0.44 - 1.00 mg/dL   Calcium 9.3 8.9 - 10.3 mg/dL   GFR calc non Af Amer >60 >60 mL/min   GFR calc Af Amer >60 >60 mL/min   Anion gap 10 5 - 15  CBC  Result Value Ref Range   WBC 15.9 (H) 3.6 - 11.0 K/uL   RBC 4.57 3.80 - 5.20 MIL/uL   Hemoglobin 13.6 12.0 - 16.0 g/dL   HCT 42.1 35.0 - 47.0 %   MCV 92.1 80.0 - 100.0 fL   MCH 29.7 26.0 - 34.0 pg   MCHC 32.3 32.0 - 36.0 g/dL   RDW 13.7 11.5 - 14.5 %   Platelets 284 150 - 440 K/uL  Troponin I  Result Value Ref Range   Troponin I 0.05 (H) <0.031 ng/mL  Troponin I  Result Value Ref Range   Troponin I 0.04 (H) <0.031 ng/mL  Troponin I  Result Value Ref Range   Troponin I <0.03 <0.031 ng/mL  CBC  Result Value Ref Range   WBC 17.2 (H) 3.6 - 11.0 K/uL   RBC 4.48 3.80 - 5.20 MIL/uL   Hemoglobin 13.2 12.0 - 16.0 g/dL   HCT 41.5 35.0 - 47.0 %   MCV 92.7 80.0 - 100.0 fL   MCH 29.4 26.0 - 34.0 pg   MCHC 31.7 (L) 32.0 - 36.0 g/dL   RDW 13.7 11.5 - 14.5 %  Platelets 268 150 - 440 K/uL  Glucose, capillary  Result Value Ref Range   Glucose-Capillary 120 (H) 65 - 99 mg/dL   Comment 1 Notify RN   Glucose, capillary   Result Value Ref Range   Glucose-Capillary 166 (H) 65 - 99 mg/dL   Comment 1 Notify RN   Glucose, capillary  Result Value Ref Range   Glucose-Capillary 126 (H) 65 - 99 mg/dL   Comment 1 Notify RN   Glucose, capillary  Result Value Ref Range   Glucose-Capillary 155 (H) 65 - 99 mg/dL   Comment 1 Notify RN   Glucose, capillary  Result Value Ref Range   Glucose-Capillary 113 (H) 65 - 99 mg/dL   Comment 1 Notify RN   CBC  Result Value Ref Range   WBC 12.0 (H) 3.6 - 11.0 K/uL   RBC 4.27 3.80 - 5.20 MIL/uL   Hemoglobin 12.9 12.0 - 16.0 g/dL   HCT 39.1 35.0 - 47.0 %   MCV 91.7 80.0 - 100.0 fL   MCH 30.2 26.0 - 34.0 pg   MCHC 33.0 32.0 - 36.0 g/dL   RDW 13.7 11.5 - 14.5 %   Platelets 236 150 - 440 K/uL  Basic metabolic panel  Result Value Ref Range   Sodium 138 135 - 145 mmol/L   Potassium 3.6 3.5 - 5.1 mmol/L   Chloride 101 101 - 111 mmol/L   CO2 31 22 - 32 mmol/L   Glucose, Bld 171 (H) 65 - 99 mg/dL   BUN 26 (H) 6 - 20 mg/dL   Creatinine, Ser 0.82 0.44 - 1.00 mg/dL   Calcium 8.7 (L) 8.9 - 10.3 mg/dL   GFR calc non Af Amer >60 >60 mL/min   GFR calc Af Amer >60 >60 mL/min   Anion gap 6 5 - 15  Glucose, capillary  Result Value Ref Range   Glucose-Capillary 165 (H) 65 - 99 mg/dL   Comment 1 Notify RN   Glucose, capillary  Result Value Ref Range   Glucose-Capillary 158 (H) 65 - 99 mg/dL   Comment 1 Notify RN   Glucose, capillary  Result Value Ref Range   Glucose-Capillary 173 (H) 65 - 99 mg/dL   Comment 1 Notify RN   Glucose, capillary  Result Value Ref Range   Glucose-Capillary 115 (H) 65 - 99 mg/dL   Comment 1 Notify RN   Glucose, capillary  Result Value Ref Range   Glucose-Capillary 150 (H) 65 - 99 mg/dL   Comment 1 Notify RN   Glucose, capillary  Result Value Ref Range   Glucose-Capillary 129 (H) 65 - 99 mg/dL  Glucose, capillary  Result Value Ref Range   Glucose-Capillary 233 (H) 65 - 99 mg/dL   Comment 1 Notify RN   Glucose, capillary  Result Value Ref  Range   Glucose-Capillary 135 (H) 65 - 99 mg/dL   Comment 1 Notify RN   Glucose, capillary  Result Value Ref Range   Glucose-Capillary 204 (H) 65 - 99 mg/dL  Glucose, capillary  Result Value Ref Range   Glucose-Capillary 121 (H) 65 - 99 mg/dL  Glucose, capillary  Result Value Ref Range   Glucose-Capillary 122 (H) 65 - 99 mg/dL   Comment 1 Notify RN   Glucose, capillary  Result Value Ref Range   Glucose-Capillary 177 (H) 65 - 99 mg/dL   Comment 1 Notify RN   CBC  Result Value Ref Range   WBC 12.6 (H) 3.6 - 11.0 K/uL   RBC 4.41  3.80 - 5.20 MIL/uL   Hemoglobin 13.4 12.0 - 16.0 g/dL   HCT 40.8 35.0 - 47.0 %   MCV 92.7 80.0 - 100.0 fL   MCH 30.5 26.0 - 34.0 pg   MCHC 32.9 32.0 - 36.0 g/dL   RDW 13.6 11.5 - 14.5 %   Platelets 240 150 - 440 K/uL  Basic metabolic panel  Result Value Ref Range   Sodium 135 135 - 145 mmol/L   Potassium 4.2 3.5 - 5.1 mmol/L   Chloride 98 (L) 101 - 111 mmol/L   CO2 33 (H) 22 - 32 mmol/L   Glucose, Bld 152 (H) 65 - 99 mg/dL   BUN 27 (H) 6 - 20 mg/dL   Creatinine, Ser 0.77 0.44 - 1.00 mg/dL   Calcium 9.0 8.9 - 10.3 mg/dL   GFR calc non Af Amer >60 >60 mL/min   GFR calc Af Amer >60 >60 mL/min   Anion gap 4 (L) 5 - 15  Glucose, capillary  Result Value Ref Range   Glucose-Capillary 210 (H) 65 - 99 mg/dL  Glucose, capillary  Result Value Ref Range   Glucose-Capillary 146 (H) 65 - 99 mg/dL  Glucose, capillary  Result Value Ref Range   Glucose-Capillary 157 (H) 65 - 99 mg/dL  Creatinine, serum  Result Value Ref Range   Creatinine, Ser 0.79 0.44 - 1.00 mg/dL   GFR calc non Af Amer >60 >60 mL/min   GFR calc Af Amer >60 >60 mL/min  Glucose, capillary  Result Value Ref Range   Glucose-Capillary 161 (H) 65 - 99 mg/dL  Glucose, capillary  Result Value Ref Range   Glucose-Capillary 222 (H) 65 - 99 mg/dL  Glucose, capillary  Result Value Ref Range   Glucose-Capillary 134 (H) 65 - 99 mg/dL  Glucose, capillary  Result Value Ref Range    Glucose-Capillary 113 (H) 65 - 99 mg/dL  Theophylline level  Result Value Ref Range   Theophylline Lvl 13.3 8.0 - 20.0  Glucose, capillary  Result Value Ref Range   Glucose-Capillary 112 (H) 65 - 99 mg/dL  Glucose, capillary  Result Value Ref Range   Glucose-Capillary 234 (H) 65 - 99 mg/dL  Glucose, capillary  Result Value Ref Range   Glucose-Capillary 141 (H) 65 - 99 mg/dL   Comment 1 Notify RN   Glucose, capillary  Result Value Ref Range   Glucose-Capillary 156 (H) 65 - 99 mg/dL   Comment 1 Notify RN       Assessment & Plan:   Problem List Items Addressed This Visit      Cardiovascular and Mediastinum   Essential hypertension, benign - Primary    Fair control today; try DASH guidelines, avoid decongestants; see AVS      Relevant Medications   metoprolol succinate (TOPROL-XL) 25 MG 24 hr tablet     Respiratory   COPD (chronic obstructive pulmonary disease) (HCC)    Centrilobular emphysema on CT scan 2016; managed by pulmonologist      Relevant Medications   GuaiFENesin (MUCINEX PO)   Perennial allergic rhinitis     Musculoskeletal and Integument   Arthritis    Try turmeric and tylenol; she is on meloxicam; check creatinine      Relevant Medications   HYDROcodone-acetaminophen (NORCO/VICODIN) 5-325 MG tablet   meloxicam (MOBIC) 7.5 MG tablet     Other   Steroid-induced hyperglycemia    Reviewed her glucose readings from her hospital stay; I suspect that these were related to steroids; will have her return  for fasting glucose and A1c      Insomnia    Taking trazodone; I am fine with prescribing that, not habit-forming         Follow up plan: Return next Monday or Wednesday, for thirty minute follow-up with fasting labs.  An after-visit summary was printed and given to the patient at [facility].  Please see the patient instructions which may contain other information and recommendations beyond what is mentioned above in the assessment and  plan.  Face-to-face time with patient was more than 30 minutes, >50% time spent counseling and coordination of care

## 2015-04-06 ENCOUNTER — Ambulatory Visit (INDEPENDENT_AMBULATORY_CARE_PROVIDER_SITE_OTHER): Payer: Medicaid Other | Admitting: Family Medicine

## 2015-04-06 ENCOUNTER — Encounter: Payer: Self-pay | Admitting: Family Medicine

## 2015-04-06 VITALS — BP 110/69 | HR 77 | Temp 98.7°F | Ht 66.3 in | Wt 186.0 lb

## 2015-04-06 DIAGNOSIS — M545 Low back pain, unspecified: Secondary | ICD-10-CM | POA: Insufficient documentation

## 2015-04-06 DIAGNOSIS — G8929 Other chronic pain: Secondary | ICD-10-CM | POA: Diagnosis not present

## 2015-04-06 DIAGNOSIS — I10 Essential (primary) hypertension: Secondary | ICD-10-CM | POA: Insufficient documentation

## 2015-04-06 DIAGNOSIS — Z9981 Dependence on supplemental oxygen: Secondary | ICD-10-CM

## 2015-04-06 DIAGNOSIS — Z5181 Encounter for therapeutic drug level monitoring: Secondary | ICD-10-CM | POA: Diagnosis not present

## 2015-04-06 DIAGNOSIS — R739 Hyperglycemia, unspecified: Secondary | ICD-10-CM | POA: Insufficient documentation

## 2015-04-06 DIAGNOSIS — Z0000 Encounter for general adult medical examination without abnormal findings: Secondary | ICD-10-CM | POA: Insufficient documentation

## 2015-04-06 DIAGNOSIS — J432 Centrilobular emphysema: Secondary | ICD-10-CM | POA: Diagnosis not present

## 2015-04-06 DIAGNOSIS — M810 Age-related osteoporosis without current pathological fracture: Secondary | ICD-10-CM | POA: Insufficient documentation

## 2015-04-06 NOTE — Assessment & Plan Note (Signed)
Well-controlled today; try the DASH guidelines 

## 2015-04-06 NOTE — Patient Instructions (Addendum)
We'll refer you to physical therapy Please do call to schedule your bone density study; the number to schedule one at either [facility] or [facility] is [phone] We'll get some labs today If you have not heard anything from my staff in a week about any orders/referrals/studies from today, please contact us here to follow-up [phone] I am so proud of you for quitting smoking! If you need anything until March 31st, give me a call here at this office If you need anything from April 3rd forward, please contact me at [facility] Please schedule a complete physical in the next month or so and we'll address screening tests, do a pap smear, order a mammogram, catch up on any vaccines, and discuss other preventive care services available  DASH Eating Plan DASH stands for "Dietary Approaches to Stop Hypertension." The DASH eating plan is a healthy eating plan that has been shown to reduce high blood pressure (hypertension). Additional health benefits may include reducing the risk of type 2 diabetes mellitus, heart disease, and stroke. The DASH eating plan may also help with weight loss. WHAT DO I NEED TO KNOW ABOUT THE DASH EATING PLAN? For the DASH eating plan, you will follow these general guidelines:  Choose foods with a percent daily value for sodium of less than 5% (as listed on the food label).  Use salt-free seasonings or herbs instead of table salt or sea salt.  Check with your health care provider or pharmacist before using salt substitutes.  Eat lower-sodium products, often labeled as "lower sodium" or "no salt added."  Eat fresh foods.  Eat more vegetables, fruits, and low-fat dairy products.  Choose whole grains. Look for the word "whole" as the first word in the ingredient list.  Choose fish and skinless chicken or [place] more often than red meat. Limit fish, poultry, and meat to 6 oz (170 g) each day.  Limit sweets, desserts, sugars, and  sugary drinks.  Choose heart-healthy fats.  Limit cheese to 1 oz (28 g) per day.  Eat more home-cooked food and less restaurant, buffet, and fast food.  Limit fried foods.  Cook foods using methods other than frying.  Limit canned vegetables. If you do use them, rinse them well to decrease the sodium.  When eating at a restaurant, ask that your food be prepared with less salt, or no salt if possible. WHAT FOODS CAN I EAT? Seek help from a dietitian for individual calorie needs. Grains Whole grain or whole wheat bread. Brown rice. Whole grain or whole wheat pasta. Quinoa, bulgur, and whole grain cereals. Low-sodium cereals. Corn or whole wheat flour tortillas. Whole grain cornbread. Whole grain crackers. Low-sodium crackers. Vegetables Fresh or frozen vegetables (raw, steamed, roasted, or grilled). Low-sodium or reduced-sodium tomato and vegetable juices. Low-sodium or reduced-sodium tomato sauce and paste. Low-sodium or reduced-sodium canned vegetables.  Fruits All fresh, canned (in natural juice), or frozen fruits. Meat and Other Protein Products Ground beef (85% or leaner), grass-fed beef, or beef trimmed of fat. Skinless chicken or [place]. Ground chicken or [place]. Pork trimmed of fat. All fish and seafood. Eggs. Dried beans, peas, or lentils. Unsalted nuts and seeds. Unsalted canned beans. Dairy Low-fat dairy products, such as skim or 1% milk, 2% or reduced-fat cheeses, low-fat ricotta or cottage cheese, or plain low-fat yogurt. Low-sodium or reduced-sodium cheeses. Fats and Oils Tub margarines without trans fats. Light or reduced-fat mayonnaise and salad dressings (reduced sodium). Avocado. Safflower, olive, or canola oils. Natural peanut  or almond butter. Other Unsalted popcorn and pretzels. The items listed above may not be a complete list of recommended foods or beverages. Contact your dietitian for more options. WHAT FOODS ARE NOT RECOMMENDED? Grains White bread. White  pasta. White rice. Refined cornbread. Bagels and croissants. Crackers that contain trans fat. Vegetables Creamed or fried vegetables. Vegetables in a cheese sauce. Regular canned vegetables. Regular canned tomato sauce and paste. Regular tomato and vegetable juices. Fruits Dried fruits. Canned fruit in light or heavy syrup. Fruit juice. Meat and Other Protein Products Fatty cuts of meat. Ribs, chicken wings, bacon, sausage, bologna, salami, chitterlings, fatback, hot dogs, bratwurst, and packaged luncheon meats. Salted nuts and seeds. Canned beans with salt. Dairy Whole or 2% milk, cream, half-and-half, and cream cheese. Whole-fat or sweetened yogurt. Full-fat cheeses or blue cheese. Nondairy creamers and whipped toppings. Processed cheese, cheese spreads, or cheese curds. Condiments Onion and garlic salt, seasoned salt, table salt, and sea salt. Canned and packaged gravies. Worcestershire sauce. Tartar sauce. Barbecue sauce. Teriyaki sauce. Soy sauce, including reduced sodium. Steak sauce. Fish sauce. Oyster sauce. Cocktail sauce. Horseradish. Ketchup and mustard. Meat flavorings and tenderizers. Bouillon cubes. Hot sauce. Tabasco sauce. Marinades. Taco seasonings. Relishes. Fats and Oils Butter, stick margarine, lard, shortening, ghee, and bacon fat. Coconut, palm kernel, or palm oils. Regular salad dressings. Other Pickles and olives. Salted popcorn and pretzels. The items listed above may not be a complete list of foods and beverages to avoid. Contact your dietitian for more information. WHERE CAN I FIND MORE INFORMATION? National Heart, Lung, and Blood Institute: travelstabloid.com   This information is not intended to replace advice given to you by your health care provider. Make sure you discuss any questions you have with your health care provider.   Document Released: 01/25/2011 Document Revised: 02/26/2014 Document Reviewed: 12/10/2012 Elsevier  Interactive Patient Education [employer].

## 2015-04-06 NOTE — Assessment & Plan Note (Signed)
Order DEXA scan; this dx is in her problem list, but patient not sure about it; will get bone density to see if osteoporosis truly present, and then address if indeed an issue

## 2015-04-06 NOTE — Assessment & Plan Note (Signed)
Managed by Dr. Raul Del, pulmonologist

## 2015-04-06 NOTE — Progress Notes (Signed)
BP 110/69 mmHg  Pulse 77  Temp(Src) 98.7 F (37.1 C)  Ht 5' 6.3" (1.684 m)  Wt 186 lb (84.369 kg)  BMI 29.75 kg/m2  SpO2 96%   Subjective:    Patient ID: Andrea Bell, female    DOB: [birth year], 60 y.o.   MRN: [id number]  HPI: Andrea Bell is a 60 y.o. female  No chief complaint on file. "I'm following up with Dr. Sanda Klein"  She had a COPD study on Monday; they started a new medicine, but she's not sure what it was; I reviewed his last note in [place]; she was put on Pulmicort by Dr. Raul Del  HTN; controlled; does not check away; does add salt to her food, though  Had lots of high glucose readings in the hospital, but she was on steroids; mother and brother had diabetes; brother just diagnosed in his 82's; here fasting today to recheck those readings  Chronic back pain; went to specialist at [facility]; he xrayed her; has arthritis; limits her function, bending over to make the bed bothers her; bneding over and straightening back up cause problems; no pains down the leg Using meloxicam for pain; takes tylenol per package directions  Anemia--> resolved in Nov and Dec 2016; no blood in stool; had colonoscopy already  Osteoporosis is in her problem list; she can't remember when last bone desnity was; no one in the family with osteoporosis; no hx of fractures; went through menopause about age 57  Insomnia; has nights when she can't sleep; no major life changes, but started to have trouble 2-3 years ago (which coincides with when she started menopause, it sounds like); trouble falling and staying asleep without medicine; tries to not take it every night; no weird crazy dreams  GERD; not really any problems; not sure why that's in the history either; no hx of stomach ulcer  Relevant past medical, surgical, family and social history reviewed and updated as indicated Past Medical History  Diagnosis Date  . COPD (chronic obstructive pulmonary disease) (HCC)     2l home  oxygen  . GERD (gastroesophageal reflux disease)   . HTN (hypertension)   . Tachycardia   . Anemia   . Chronic back pain   . Osteoporosis   . Osteoarthritis   . Insomnia   Reviewed with patient Anemia - resolved per CBC in Nov and Dec 2016 when we reviewed her hospital record Osteoporosis - she is not sure about this; can't remember last DEXA Tachycardia - on beta-blocker  Past Surgical History  Procedure Laterality Date  . Tear duct surgery      bilateral  . Cyst removal trunk    . Cystectomy      back and side of head   Interim medical history since our last visit reviewed. Saw Dr. Raul Del, pulm; started new medicine; had breathing study  Allergies and medications reviewed and updated.  Current outpatient prescriptions:  .  aspirin EC 81 MG tablet, Take 81 mg by mouth daily., Disp: , Rfl:  .  esomeprazole (NEXIUM) 40 MG capsule, Take 40 mg by mouth daily., Disp: , Rfl:  .  Estradiol-Norethindrone Acet 0.5-0.1 MG tablet, Take 1 tablet by mouth daily., Disp: , Rfl:  .  GuaiFENesin (MUCINEX PO), Take 1 tablet by mouth 2 (two) times daily as needed., Disp: , Rfl:  .  HYDROcodone-acetaminophen (NORCO/VICODIN) 5-325 MG tablet, Take 1 tablet by mouth every 6 (six) hours as needed for moderate pain. rx written 10/08/14., Disp: , Rfl:  .  ipratropium-albuterol (DUONEB) 0.5-2.5 (3) MG/3ML SOLN, Inhale 3 mLs into the lungs 4 (four) times daily as needed. For wheezing., Disp: , Rfl:  .  lisinopril-hydrochlorothiazide (PRINZIDE,ZESTORETIC) 10-12.5 MG tablet, Take 1 tablet by mouth daily., Disp: , Rfl:  .  loratadine (CLARITIN) 10 MG tablet, Take 10 mg by mouth daily., Disp: , Rfl:  .  meloxicam (MOBIC) 7.5 MG tablet, Take 7.5 mg by mouth 2 (two) times daily., Disp: , Rfl:  .  metoprolol succinate (TOPROL-XL) 25 MG 24 hr tablet, Take by mouth daily., Disp: , Rfl:  .  Olopatadine HCl 0.2 % SOLN, Apply 1 drop to eye daily., Disp: , Rfl:  .  OXYGEN, Inhale into the lungs., Disp: , Rfl:  .   senna-docusate (SENOKOT-S) 8.6-50 MG tablet, Take 1 tablet by mouth 2 (two) times daily as needed for mild constipation or moderate constipation., Disp: 30 tablet, Rfl: 0 .  theophylline (THEODUR) 300 MG 12 hr tablet, Take 1 tablet (300 mg total) by mouth every 12 (twelve) hours., Disp: 60 tablet, Rfl: 0 .  tiZANidine (ZANAFLEX) 4 MG tablet, Take 4 mg by mouth 3 (three) times daily as needed. For muscle spasms., Disp: , Rfl:  .  traMADol (ULTRAM) 50 MG tablet, Take 1 tablet (50 mg total) by mouth every 6 (six) hours as needed., Disp: 20 tablet, Rfl: 0 .  traZODone (DESYREL) 100 MG tablet, Take 100 mg by mouth at bedtime., Disp: , Rfl:  .  triamcinolone (NASACORT) 55 MCG/ACT AERO nasal inhaler, Place 2 sprays into the nose daily., Disp: , Rfl:  Pulmicort added by pulmnologist  Review of Systems Per HPI unless specifically indicated above     Objective:    BP 110/69 mmHg  Pulse 77  Temp(Src) 98.7 F (37.1 C)  Ht 5' 6.3" (1.684 m)  Wt 186 lb (84.369 kg)  BMI 29.75 kg/m2  SpO2 96%  Wt Readings from Last 3 Encounters:  04/06/15 186 lb (84.369 kg)  03/30/15 187 lb (84.823 kg)  01/22/15 180 lb 6.4 oz (81.829 kg)    Physical Exam  Constitutional: She appears well-developed and well-nourished. No distress.  HENT:  Head: Normocephalic and atraumatic.  Eyes: EOM are normal. No scleral icterus.  Neck: No thyromegaly present.  Cardiovascular: Normal rate, regular rhythm and normal heart sounds.   No murmur heard. Pulmonary/Chest: Effort normal and breath sounds normal. No respiratory distress. She has no wheezes.  Abdominal: Soft. Bowel sounds are normal. She exhibits no distension.  Musculoskeletal: Normal range of motion. She exhibits no edema.  Neurological: She is alert. She displays no tremor. She exhibits normal muscle tone. Gait normal.  Skin: Skin is warm and dry. She is not diaphoretic. No pallor.  Psychiatric: She has a normal mood and affect. Her behavior is normal. Judgment and  thought content normal.   Results for orders placed or performed during the hospital encounter of 01/16/15  CBC  Result Value Ref Range   WBC 11.1 (H) 3.6 - 11.0 K/uL   RBC 4.70 3.80 - 5.20 MIL/uL   Hemoglobin 14.2 12.0 - 16.0 g/dL   HCT 42.9 35.0 - 47.0 %   MCV 91.3 80.0 - 100.0 fL   MCH 30.1 26.0 - 34.0 pg   MCHC 33.0 32.0 - 36.0 g/dL   RDW 13.6 11.5 - 14.5 %   Platelets 261 150 - 440 K/uL  Troponin I  Result Value Ref Range   Troponin I <0.03 <0.031 ng/mL  Comprehensive metabolic panel  Result Value Ref Range  Sodium 146 (H) 135 - 145 mmol/L   Potassium 4.5 3.5 - 5.1 mmol/L   Chloride 105 101 - 111 mmol/L   CO2 27 22 - 32 mmol/L   Glucose, Bld 109 (H) 65 - 99 mg/dL   BUN 22 (H) 6 - 20 mg/dL   Creatinine, Ser 0.91 0.44 - 1.00 mg/dL   Calcium 9.5 8.9 - 10.3 mg/dL   Total Protein 7.8 6.5 - 8.1 g/dL   Albumin 4.2 3.5 - 5.0 g/dL   AST 29 15 - 41 U/L   ALT 22 14 - 54 U/L   Alkaline Phosphatase 72 38 - 126 U/L   Total Bilirubin 0.6 0.3 - 1.2 mg/dL   GFR calc non Af Amer >60 >60 mL/min   GFR calc Af Amer >60 >60 mL/min   Anion gap 14 5 - 15  Basic metabolic panel  Result Value Ref Range   Sodium 139 135 - 145 mmol/L   Potassium 3.8 3.5 - 5.1 mmol/L   Chloride 100 (L) 101 - 111 mmol/L   CO2 29 22 - 32 mmol/L   Glucose, Bld 172 (H) 65 - 99 mg/dL   BUN 23 (H) 6 - 20 mg/dL   Creatinine, Ser 0.93 0.44 - 1.00 mg/dL   Calcium 9.3 8.9 - 10.3 mg/dL   GFR calc non Af Amer >60 >60 mL/min   GFR calc Af Amer >60 >60 mL/min   Anion gap 10 5 - 15  CBC  Result Value Ref Range   WBC 15.9 (H) 3.6 - 11.0 K/uL   RBC 4.57 3.80 - 5.20 MIL/uL   Hemoglobin 13.6 12.0 - 16.0 g/dL   HCT 42.1 35.0 - 47.0 %   MCV 92.1 80.0 - 100.0 fL   MCH 29.7 26.0 - 34.0 pg   MCHC 32.3 32.0 - 36.0 g/dL   RDW 13.7 11.5 - 14.5 %   Platelets 284 150 - 440 K/uL  Troponin I  Result Value Ref Range   Troponin I 0.05 (H) <0.031 ng/mL  Troponin I  Result Value Ref Range   Troponin I 0.04 (H) <0.031 ng/mL   Troponin I  Result Value Ref Range   Troponin I <0.03 <0.031 ng/mL  CBC  Result Value Ref Range   WBC 17.2 (H) 3.6 - 11.0 K/uL   RBC 4.48 3.80 - 5.20 MIL/uL   Hemoglobin 13.2 12.0 - 16.0 g/dL   HCT 41.5 35.0 - 47.0 %   MCV 92.7 80.0 - 100.0 fL   MCH 29.4 26.0 - 34.0 pg   MCHC 31.7 (L) 32.0 - 36.0 g/dL   RDW 13.7 11.5 - 14.5 %   Platelets 268 150 - 440 K/uL  Glucose, capillary  Result Value Ref Range   Glucose-Capillary 120 (H) 65 - 99 mg/dL   Comment 1 Notify RN   Glucose, capillary  Result Value Ref Range   Glucose-Capillary 166 (H) 65 - 99 mg/dL   Comment 1 Notify RN   Glucose, capillary  Result Value Ref Range   Glucose-Capillary 126 (H) 65 - 99 mg/dL   Comment 1 Notify RN   Glucose, capillary  Result Value Ref Range   Glucose-Capillary 155 (H) 65 - 99 mg/dL   Comment 1 Notify RN   Glucose, capillary  Result Value Ref Range   Glucose-Capillary 113 (H) 65 - 99 mg/dL   Comment 1 Notify RN   CBC  Result Value Ref Range   WBC 12.0 (H) 3.6 - 11.0 K/uL   RBC 4.27 3.80 -  5.20 MIL/uL   Hemoglobin 12.9 12.0 - 16.0 g/dL   HCT 39.1 35.0 - 47.0 %   MCV 91.7 80.0 - 100.0 fL   MCH 30.2 26.0 - 34.0 pg   MCHC 33.0 32.0 - 36.0 g/dL   RDW 13.7 11.5 - 14.5 %   Platelets 236 150 - 440 K/uL  Basic metabolic panel  Result Value Ref Range   Sodium 138 135 - 145 mmol/L   Potassium 3.6 3.5 - 5.1 mmol/L   Chloride 101 101 - 111 mmol/L   CO2 31 22 - 32 mmol/L   Glucose, Bld 171 (H) 65 - 99 mg/dL   BUN 26 (H) 6 - 20 mg/dL   Creatinine, Ser 0.82 0.44 - 1.00 mg/dL   Calcium 8.7 (L) 8.9 - 10.3 mg/dL   GFR calc non Af Amer >60 >60 mL/min   GFR calc Af Amer >60 >60 mL/min   Anion gap 6 5 - 15  Glucose, capillary  Result Value Ref Range   Glucose-Capillary 165 (H) 65 - 99 mg/dL   Comment 1 Notify RN   Glucose, capillary  Result Value Ref Range   Glucose-Capillary 158 (H) 65 - 99 mg/dL   Comment 1 Notify RN   Glucose, capillary  Result Value Ref Range   Glucose-Capillary 173 (H)  65 - 99 mg/dL   Comment 1 Notify RN   Glucose, capillary  Result Value Ref Range   Glucose-Capillary 115 (H) 65 - 99 mg/dL   Comment 1 Notify RN   Glucose, capillary  Result Value Ref Range   Glucose-Capillary 150 (H) 65 - 99 mg/dL   Comment 1 Notify RN   Glucose, capillary  Result Value Ref Range   Glucose-Capillary 129 (H) 65 - 99 mg/dL  Glucose, capillary  Result Value Ref Range   Glucose-Capillary 233 (H) 65 - 99 mg/dL   Comment 1 Notify RN   Glucose, capillary  Result Value Ref Range   Glucose-Capillary 135 (H) 65 - 99 mg/dL   Comment 1 Notify RN   Glucose, capillary  Result Value Ref Range   Glucose-Capillary 204 (H) 65 - 99 mg/dL  Glucose, capillary  Result Value Ref Range   Glucose-Capillary 121 (H) 65 - 99 mg/dL  Glucose, capillary  Result Value Ref Range   Glucose-Capillary 122 (H) 65 - 99 mg/dL   Comment 1 Notify RN   Glucose, capillary  Result Value Ref Range   Glucose-Capillary 177 (H) 65 - 99 mg/dL   Comment 1 Notify RN   CBC  Result Value Ref Range   WBC 12.6 (H) 3.6 - 11.0 K/uL   RBC 4.41 3.80 - 5.20 MIL/uL   Hemoglobin 13.4 12.0 - 16.0 g/dL   HCT 40.8 35.0 - 47.0 %   MCV 92.7 80.0 - 100.0 fL   MCH 30.5 26.0 - 34.0 pg   MCHC 32.9 32.0 - 36.0 g/dL   RDW 13.6 11.5 - 14.5 %   Platelets 240 150 - 440 K/uL  Basic metabolic panel  Result Value Ref Range   Sodium 135 135 - 145 mmol/L   Potassium 4.2 3.5 - 5.1 mmol/L   Chloride 98 (L) 101 - 111 mmol/L   CO2 33 (H) 22 - 32 mmol/L   Glucose, Bld 152 (H) 65 - 99 mg/dL   BUN 27 (H) 6 - 20 mg/dL   Creatinine, Ser 0.77 0.44 - 1.00 mg/dL   Calcium 9.0 8.9 - 10.3 mg/dL   GFR calc non Af Amer >60 >60 mL/min  GFR calc Af Amer >60 >60 mL/min   Anion gap 4 (L) 5 - 15  Glucose, capillary  Result Value Ref Range   Glucose-Capillary 210 (H) 65 - 99 mg/dL  Glucose, capillary  Result Value Ref Range   Glucose-Capillary 146 (H) 65 - 99 mg/dL  Glucose, capillary  Result Value Ref Range   Glucose-Capillary 157  (H) 65 - 99 mg/dL  Creatinine, serum  Result Value Ref Range   Creatinine, Ser 0.79 0.44 - 1.00 mg/dL   GFR calc non Af Amer >60 >60 mL/min   GFR calc Af Amer >60 >60 mL/min  Glucose, capillary  Result Value Ref Range   Glucose-Capillary 161 (H) 65 - 99 mg/dL  Glucose, capillary  Result Value Ref Range   Glucose-Capillary 222 (H) 65 - 99 mg/dL  Glucose, capillary  Result Value Ref Range   Glucose-Capillary 134 (H) 65 - 99 mg/dL  Glucose, capillary  Result Value Ref Range   Glucose-Capillary 113 (H) 65 - 99 mg/dL  Theophylline level  Result Value Ref Range   Theophylline Lvl 13.3 8.0 - 20.0  Glucose, capillary  Result Value Ref Range   Glucose-Capillary 112 (H) 65 - 99 mg/dL  Glucose, capillary  Result Value Ref Range   Glucose-Capillary 234 (H) 65 - 99 mg/dL  Glucose, capillary  Result Value Ref Range   Glucose-Capillary 141 (H) 65 - 99 mg/dL   Comment 1 Notify RN   Glucose, capillary  Result Value Ref Range   Glucose-Capillary 156 (H) 65 - 99 mg/dL   Comment 1 Notify RN       Assessment & Plan:   Problem List Items Addressed This Visit      Cardiovascular and Mediastinum   Essential hypertension, benign    Well-controlled today; try the DASH guidelines        Respiratory   COPD (chronic obstructive pulmonary disease) ([place])    Managed by Dr. Raul Del, pulmonologist; so glad that she has quit smoking        Musculoskeletal and Integument   Osteoporosis    Order DEXA scan; this dx is in her problem list, but patient not sure about it; will get bone density to see if osteoporosis truly present, and then address if indeed an issue      Relevant Orders   DG Bone Density     Other   Chronic bilateral low back pain    She has already had imaging and seen back specialist; previous attempt at physical therapy was not approved, apparently; I told her I'll be glad to order that; referral entered      Relevant Orders   Ambulatory referral to Physical Therapy    Hyperglycemia - Primary    She had elevated glucose readings in the hospital when on steroids; will check fasting glucose and A1c today to make sure she does not meet criteria for diabetes; she has positive family hx (mother and brother)      Relevant Orders   Hgb A1c w/o eAG   Preventative health care    Will have patient return for complete physical soon; I ordered her fasting lipids today since she was already fasting; review of health maintenance shows she is due for several preventive care services, so we'll bring her back for 30 minute complete physical      Relevant Orders   Lipid Panel w/o Chol/HDL Ratio   Comprehensive metabolic panel   Medication monitoring encounter    Check K+ and creatinine on BP medicine  Supplemental oxygen dependent    Managed by Dr. Raul Del, pulmonologist         Follow up plan: Return in about 6 months (around 10/04/2015) for thirty minute follow-up; complete physical soon.  Orders Placed This Encounter  Procedures  . DG Bone Density  . Lipid Panel w/o Chol/HDL Ratio  . Hgb A1c w/o eAG  . Comprehensive metabolic panel  . Ambulatory referral to Physical Therapy   An after-visit summary was printed and given to the patient at [facility].  Please see the patient instructions which may contain other information and recommendations beyond what is mentioned above in the assessment and plan.

## 2015-04-06 NOTE — Assessment & Plan Note (Signed)
She had elevated glucose readings in the hospital when on steroids; will check fasting glucose and A1c today to make sure she does not meet criteria for diabetes; she has positive family hx (mother and brother)

## 2015-04-06 NOTE — Assessment & Plan Note (Signed)
She has already had imaging and seen back specialist; previous attempt at physical therapy was not approved, apparently; I told her I'll be glad to order that; referral entered

## 2015-04-06 NOTE — Assessment & Plan Note (Signed)
Check K+ and creatinine on BP medicine

## 2015-04-06 NOTE — Assessment & Plan Note (Signed)
Will have patient return for complete physical soon; I ordered her fasting lipids today since she was already fasting; review of health maintenance shows she is due for several preventive care services, so we'll bring her back for 30 minute complete physical

## 2015-04-06 NOTE — Assessment & Plan Note (Signed)
Managed by Dr. Raul Del, pulmonologist; so glad that she has quit smoking

## 2015-04-07 LAB — LIPID PANEL W/O CHOL/HDL RATIO
CHOLESTEROL TOTAL: 185 mg/dL (ref 100–199)
HDL: 50 mg/dL (ref >39)
LDL Calculated: 108 mg/dL — ABNORMAL HIGH (ref 0–99)
Triglycerides: 134 mg/dL (ref 0–149)
VLDL CHOLESTEROL CAL: 27 mg/dL (ref 5–40)

## 2015-04-07 LAB — COMPREHENSIVE METABOLIC PANEL
A/G RATIO: 1.9 (ref 1.1–2.5)
ALBUMIN: 4.3 g/dL (ref 3.5–5.5)
ALK PHOS: 61 IU/L (ref 39–117)
ALT: 21 IU/L (ref 0–32)
AST: 19 IU/L (ref 0–40)
BUN / CREAT RATIO: 19 (ref 9–23)
BUN: 13 mg/dL (ref 6–24)
Bilirubin Total: 0.3 mg/dL (ref 0.0–1.2)
CO2: 23 mmol/L (ref 18–29)
Calcium: 9.2 mg/dL (ref 8.7–10.2)
Chloride: 104 mmol/L (ref 96–106)
Creatinine, Ser: 0.67 mg/dL (ref 0.57–1.00)
GFR calc Af Amer: 111 mL/min/{1.73_m2} (ref >59)
GFR, EST NON AFRICAN AMERICAN: 97 mL/min/{1.73_m2} (ref >59)
GLOBULIN, TOTAL: 2.3 g/dL (ref 1.5–4.5)
Glucose: 83 mg/dL (ref 65–99)
POTASSIUM: 3.9 mmol/L (ref 3.5–5.2)
SODIUM: 145 mmol/L — AB (ref 134–144)
Total Protein: 6.6 g/dL (ref 6.0–8.5)

## 2015-04-07 LAB — HGB A1C W/O EAG: Hgb A1c MFr Bld: 6.1 % — ABNORMAL HIGH (ref 4.8–5.6)

## 2015-04-12 ENCOUNTER — Telehealth: Payer: Self-pay | Admitting: Family Medicine

## 2015-04-12 DIAGNOSIS — R7301 Impaired fasting glucose: Secondary | ICD-10-CM

## 2015-04-12 NOTE — Telephone Encounter (Signed)
I left msg, calling about labs, will try again tomorrow A1c 6.1, LDL 108, Na+ 145; other labs normal

## 2015-04-13 ENCOUNTER — Telehealth: Payer: Self-pay | Admitting: Family Medicine

## 2015-04-13 DIAGNOSIS — R7301 Impaired fasting glucose: Secondary | ICD-10-CM | POA: Insufficient documentation

## 2015-04-13 NOTE — Telephone Encounter (Signed)
Pt called stated she was returning a call from [place]. Pt stated if she is not at home it is ok to leave a message on her voicemail. Thanks.

## 2015-04-13 NOTE — Telephone Encounter (Signed)
See other open phone note

## 2015-04-13 NOTE — Telephone Encounter (Signed)
I spoke with patient; "prediabetes", not diabetes; her sugars were up in the fall from the steroids LDL just a tiny bit above ideal; HDL is great though; everything overall okay Call me if needed before August appt; here until March 31st, then at [facility] April 3rd on

## 2015-04-15 DIAGNOSIS — R739 Hyperglycemia, unspecified: Secondary | ICD-10-CM | POA: Insufficient documentation

## 2015-04-15 DIAGNOSIS — M1990 Unspecified osteoarthritis, unspecified site: Secondary | ICD-10-CM | POA: Insufficient documentation

## 2015-04-15 DIAGNOSIS — G4700 Insomnia, unspecified: Secondary | ICD-10-CM | POA: Insufficient documentation

## 2015-04-15 DIAGNOSIS — T380X5A Adverse effect of glucocorticoids and synthetic analogues, initial encounter: Secondary | ICD-10-CM

## 2015-04-15 DIAGNOSIS — J3089 Other allergic rhinitis: Secondary | ICD-10-CM | POA: Insufficient documentation

## 2015-04-15 NOTE — Assessment & Plan Note (Addendum)
Fair control today; try DASH guidelines, avoid decongestants; see AVS

## 2015-04-15 NOTE — Assessment & Plan Note (Addendum)
Try turmeric and tylenol; she is on meloxicam; check creatinine

## 2015-04-15 NOTE — Assessment & Plan Note (Signed)
Centrilobular emphysema on CT scan 2016; managed by pulmonologist

## 2015-04-15 NOTE — Assessment & Plan Note (Addendum)
Reviewed her glucose readings from her hospital stay; I suspect that these were related to steroids; will have her return for fasting glucose and A1c

## 2015-04-15 NOTE — Assessment & Plan Note (Signed)
Taking trazodone; I am fine with prescribing that, not habit-forming

## 2015-04-18 ENCOUNTER — Ambulatory Visit: Payer: Medicaid Other | Attending: Family Medicine | Admitting: Physical Therapy

## 2015-04-18 ENCOUNTER — Encounter: Payer: Self-pay | Admitting: Physical Therapy

## 2015-04-18 DIAGNOSIS — M545 Low back pain, unspecified: Secondary | ICD-10-CM

## 2015-04-18 DIAGNOSIS — M2560 Stiffness of unspecified joint, not elsewhere classified: Secondary | ICD-10-CM | POA: Insufficient documentation

## 2015-04-19 NOTE — Therapy (Signed)
[facility] [address] [place], [place], [address] Phone: [phone]   Fax:  [phone]  Physical Therapy Evaluation  Patient Details  Name: Andrea Bell MRN: [id number] Date of Birth: [birth year] Referring Provider: Dr. Sanda Klein  Encounter Date: 04/18/2015      PT End of Session - 04/18/15 1816    Visit Number 1   Number of Visits 1   Date for PT Re-Evaluation 04/19/15   PT Start Time 1105   PT Stop Time 1153   PT Time Calculation (min) 48 min   Activity Tolerance Patient tolerated treatment well;Patient limited by pain   Behavior During Therapy [facility] for tasks assessed/performed      Past Medical History  Diagnosis Date  . COPD (chronic obstructive pulmonary disease) (HCC)     2l home oxygen  . GERD (gastroesophageal reflux disease)   . HTN (hypertension)   . Tachycardia   . Anemia   . Chronic back pain   . Osteoporosis   . Osteoarthritis   . Insomnia     Past Surgical History  Procedure Laterality Date  . Tear duct surgery      bilateral  . Cyst removal trunk    . Cystectomy      back and side of head    There were no vitals filed for this visit.  Visit Diagnosis:  Bilateral low back pain without sciatica  Joint stiffness      Subjective Assessment - 04/19/15 1812    Subjective Pt. reports chronic low back pain over past 10 years.  Pt. states pain in back is okay at rest but increases with bending/ prolong standing.     Currently in Pain? Yes   Pain Score 5    Pain Orientation Lower   Pain Descriptors / Indicators Aching;Tightness   Pain Onset More than a month ago   Pain Frequency Intermittent            [facility] PT Assessment - 04/19/15 0001    Assessment   Medical Diagnosis Low back pain   Referring Provider Dr. Sanda Klein   Onset Date/Surgical Date 02/19/05            PT Education - 04/19/15 1815    Education provided Yes   Education Details See handouts   Person(s) Educated Patient   Methods Explanation;Demonstration;Handout   Comprehension Verbalized understanding;Returned demonstration            Plan - 04/19/15 1817    Clinical Impression Statement Pt. is a 61 y/o female with c/o chroinc LBP over psat 10+ years with no specific MOI.  Pt. reports increase back pain with bending/ lifting/ returning to upright posture but minimal discomfort at rest.  Pt. presents with good lumbar flexion (pain limited at end-range and increase lumbar pain with return to upright posture).  Lumbar extension 50% limited with c/o 6/10 pain.  No increase c/o pain with lumbar rotn./ lateral flexion.  B LE muscle strength grossly 5/5 MMT with generalized B hamstring tightness noted.  Significant lumbar hypomobility wtih PA mobs./ spinal assessement in prone position.  Pt. instructed in lumbar/LE stretching program and core stability with HEP issued.  Pt. limited by Medicaid 1 visit at this time.     Pt will benefit from skilled therapeutic intervention in order to improve on the following deficits Decreased strength;Improper body mechanics;Postural dysfunction;Decreased activity tolerance;Decreased mobility;Impaired flexibility;Decreased range of motion;Hypomobility;Pain   Rehab Potential Fair   PT Frequency 1x / week  PT Treatment/Interventions Cryotherapy;Electrical Stimulation;Moist Heat;Patient/family education;Passive range of motion;Therapeutic exercise;Manual techniques   PT Next Visit Plan HEP issued.    PT Home Exercise Plan see handouts.          Problem List Patient Active Problem List   Diagnosis Date Noted  . Steroid-induced hyperglycemia 04/15/2015  . Arthritis 04/15/2015  . Perennial allergic rhinitis 04/15/2015  . Insomnia 04/15/2015  . Impaired fasting glucose 04/13/2015  . Chronic bilateral low back pain 04/06/2015  . Preventative health care 04/06/2015  . Medication monitoring encounter 04/06/2015  . Osteoporosis 04/06/2015  . Essential hypertension, benign  04/06/2015  . Supplemental oxygen dependent 04/06/2015  . COPD (chronic obstructive pulmonary disease) ([place]) 01/16/2015   Pura Spice, PT, DPT # [phone]   04/19/2015, 6:35 PM  [facility] [address] [place], [place], [address] Phone: [phone]   Fax:  [phone]  Name: Andrea Bell MRN: [id number] Date of Birth: 31-Dec-1955

## 2015-05-02 ENCOUNTER — Encounter: Payer: Medicaid Other | Admitting: Physical Therapy

## 2015-05-03 ENCOUNTER — Encounter: Payer: Self-pay | Admitting: Family Medicine

## 2015-05-03 ENCOUNTER — Ambulatory Visit (INDEPENDENT_AMBULATORY_CARE_PROVIDER_SITE_OTHER): Payer: Medicaid Other | Admitting: Family Medicine

## 2015-05-03 VITALS — BP 146/80 | HR 89 | Temp 97.6°F | Ht 65.5 in | Wt 189.0 lb

## 2015-05-03 DIAGNOSIS — L309 Dermatitis, unspecified: Secondary | ICD-10-CM

## 2015-05-03 MED ORDER — TRIAMCINOLONE ACETONIDE 0.5 % EX OINT: 1.0000 "application " | TOPICAL_OINTMENT | Freq: Three times a day (TID) | CUTANEOUS | Status: DC

## 2015-05-03 NOTE — Progress Notes (Signed)
BP 146/80 mmHg  Pulse 89  Temp(Src) 97.6 F (36.4 C)  Ht 5' 5.5" (1.664 m)  Wt 189 lb (85.73 kg)  BMI 30.96 kg/m2  SpO2 98%   Subjective:    Patient ID: Andrea Bell, female    DOB: Nov 22, 1955, 60 y.o.   MRN: [id number]  HPI: Andrea Bell is a 59 y.o. female  Chief Complaint  Patient presents with  . Rash    on her legs, groin, lower abdomen since last weekend. it does itch. No change in detergant or soaps.  . Other    She is willing to get the HIV and Hep C test done if she needs labs done today   Rash came up on the lower abd, upper legs, and groin She noticed a blue color on the washrag when bathing just the last couple of days No one else has a rash at home No itching all over, just where the spots are No fevers No swelling of lips or throat No travel; no pets at the home Has not tried any creams or lotions Urine is normal color, stool is normal color No one shares their washer/dryer  Had shingles around left upper chest and back a few years ago but this is different  Has a little something salty the last few days; worried about rash and thinks that is why her BP is up  Relevant past medical history reviewed and updated as indicated Allergies and medications reviewed and updated.  Review of Systems  Per HPI unless specifically indicated above     Objective:    BP 146/80 mmHg  Pulse 89  Temp(Src) 97.6 F (36.4 C)  Ht 5' 5.5" (1.664 m)  Wt 189 lb (85.73 kg)  BMI 30.96 kg/m2  SpO2 98%  Wt Readings from Last 3 Encounters:  05/03/15 189 lb (85.73 kg)  04/06/15 186 lb (84.369 kg)  03/30/15 187 lb (84.823 kg)    Physical Exam  Constitutional: She appears well-developed and well-nourished.  Eyes: No scleral icterus.  Cardiovascular: Normal rate.   Pulmonary/Chest: Effort normal.  Skin: Rash (lower abdomen, upper right thigh, upper lateral left thigh; erythematous; no scale; well-circumscribed; variable shapes, not serpinginous; not blanching) noted.   Psychiatric: She has a normal mood and affect.    Results for orders placed or performed in visit on 04/06/15  Lipid Panel w/o Chol/HDL Ratio  Result Value Ref Range   Cholesterol, Total 185 100 - 199 mg/dL   Triglycerides 134 0 - 149 mg/dL   HDL 50 >39 mg/dL   VLDL Cholesterol Cal 27 5 - 40 mg/dL   LDL Calculated 108 (H) 0 - 99 mg/dL  Hgb A1c w/o eAG  Result Value Ref Range   Hgb A1c MFr Bld 6.1 (H) 4.8 - 5.6 %  Comprehensive metabolic panel  Result Value Ref Range   Glucose 83 65 - 99 mg/dL   BUN 13 6 - 24 mg/dL   Creatinine, Ser 0.67 0.57 - 1.00 mg/dL   GFR calc non Af Amer 97 >59 mL/min/1.73   GFR calc Af Amer 111 >59 mL/min/1.73   BUN/Creatinine Ratio 19 9 - 23   Sodium 145 (H) 134 - 144 mmol/L   Potassium 3.9 3.5 - 5.2 mmol/L   Chloride 104 96 - 106 mmol/L   CO2 23 18 - 29 mmol/L   Calcium 9.2 8.7 - 10.2 mg/dL   Total Protein 6.6 6.0 - 8.5 g/dL   Albumin 4.3 3.5 - 5.5 g/dL   Globulin, Total  2.3 1.5 - 4.5 g/dL   Albumin/Globulin Ratio 1.9 1.1 - 2.5   Bilirubin Total 0.3 0.0 - 1.2 mg/dL   Alkaline Phosphatase 61 39 - 117 IU/L   AST 19 0 - 40 IU/L   ALT 21 0 - 32 IU/L      Assessment & Plan:   Problem List Items Addressed This Visit      Musculoskeletal and Integument   Dermatitis - Primary    Quite unusual; will use TAC topically over the rash; patient to watch for additional signs/symptoms and notify me of any changes or worsening; if worse, then refer to derm; I think the blue coloration on the washcloth is from something external, not internal coming through her pores; advised mild detergent, no fabric softeners for now; I don't think this is contagious and explained I don't think this is cancer; will want to try the first line of TAC and then proceed with work-up, referral if not resolving; she agrees         Follow up plan: No Follow-up on file.  An after-visit summary was printed and given to the patient at [facility].  Please see the patient instructions  which may contain other information and recommendations beyond what is mentioned above in the assessment and plan.  Meds ordered this encounter  Medications  . triamcinolone ointment (KENALOG) 0.5 %    Sig: Apply 1 application topically 3 (three) times daily.    Dispense:  30 g    Refill:  0   We'll get other labs for HM (hiv and hep C) next time she has labs drawn

## 2015-05-03 NOTE — Assessment & Plan Note (Signed)
Quite unusual; will use TAC topically over the rash; patient to watch for additional signs/symptoms and notify me of any changes or worsening; if worse, then refer to derm; I think the blue coloration on the washcloth is from something external, not internal coming through her pores; advised mild detergent, no fabric softeners for now; I don't think this is contagious and explained I don't think this is cancer; will want to try the first line of TAC and then proceed with work-up, referral if not resolving; she agrees

## 2015-05-03 NOTE — Patient Instructions (Addendum)
Do use gentle detergents and avoid fabric softeners until rash clears up Watch for other signs and call me if anything changes Use the new topical two to three times a day If not going away or getting worse, call me and we'll have you see a skin specialist

## 2015-08-04 ENCOUNTER — Other Ambulatory Visit: Payer: Self-pay | Admitting: Family Medicine

## 2015-08-04 DIAGNOSIS — Z1231 Encounter for screening mammogram for malignant neoplasm of breast: Secondary | ICD-10-CM

## 2015-08-29 ENCOUNTER — Ambulatory Visit
Admission: RE | Admit: 2015-08-29 | Discharge: 2015-08-29 | Disposition: A | Discharge status: Home / Self Care | Payer: Medicare PPO | Source: Ambulatory Visit | Attending: Family Medicine | Admitting: Family Medicine

## 2015-08-29 ENCOUNTER — Encounter: Payer: Self-pay | Admitting: Family Medicine

## 2015-08-29 ENCOUNTER — Ambulatory Visit (INDEPENDENT_AMBULATORY_CARE_PROVIDER_SITE_OTHER): Payer: Medicaid Other | Admitting: Family Medicine

## 2015-08-29 VITALS — BP 118/84 | HR 95 | Temp 98.7°F | Resp 16 | Wt 183.0 lb

## 2015-08-29 DIAGNOSIS — N950 Postmenopausal bleeding: Secondary | ICD-10-CM | POA: Diagnosis not present

## 2015-08-29 DIAGNOSIS — Z0000 Encounter for general adult medical examination without abnormal findings: Secondary | ICD-10-CM

## 2015-08-29 DIAGNOSIS — Z114 Encounter for screening for human immunodeficiency virus [HIV]: Secondary | ICD-10-CM | POA: Diagnosis not present

## 2015-08-29 DIAGNOSIS — Z1239 Encounter for other screening for malignant neoplasm of breast: Secondary | ICD-10-CM | POA: Insufficient documentation

## 2015-08-29 DIAGNOSIS — Z1159 Encounter for screening for other viral diseases: Secondary | ICD-10-CM

## 2015-08-29 DIAGNOSIS — M79641 Pain in right hand: Secondary | ICD-10-CM | POA: Insufficient documentation

## 2015-08-29 DIAGNOSIS — M79642 Pain in left hand: Secondary | ICD-10-CM | POA: Diagnosis present

## 2015-08-29 DIAGNOSIS — Z124 Encounter for screening for malignant neoplasm of cervix: Secondary | ICD-10-CM

## 2015-08-29 MED ORDER — METOPROLOL SUCCINATE ER 25 MG PO TB24: 12.5000 mg | ORAL_TABLET | Freq: Every day | ORAL | Status: DC

## 2015-08-29 NOTE — Patient Instructions (Addendum)
Please do contact your eye doctor about the right eye Please do call to schedule your mammogram; the number to schedule one at either [facility] or [facility] is [phone] Take vitamin D3, one thousand international units a day (1,000 i.u.) Try to limit saturated fats in your diet (bologna, hot dogs, barbeque, cheeseburgers, hamburgers, steak, bacon, sausage, cheese, etc.) and get more fresh fruits, vegetables, and whole grains Limit eggs to no more than 3 per week Try some over-the-counter magnesium oxide 250 mg or 400 mg daily if you have leg cramps You could also try some tonic water, just 4 ounces in the evenings might prevent leg cramps as well You can have xrays done across the street today We'll get fasting labs at your appointment in August We'll get the ultrasound of your uterus and see what's going on there If you have not heard anything from my staff in a week about any orders/referrals/studies from today, please contact us here to follow-up ([phone]) [phone]    Health Maintenance, Female Adopting a healthy lifestyle and getting preventive care can go a long way to promote health and wellness. Talk with your health care provider about what schedule of regular examinations is right for you. This is a good chance for you to check in with your provider about disease prevention and staying healthy. In between checkups, there are plenty of things you can do on your own. Experts have done a lot of research about which lifestyle changes and preventive measures are most likely to keep you healthy. Ask your health care provider for more information. WEIGHT AND DIET  Eat a healthy diet  Be sure to include plenty of vegetables, fruits, low-fat dairy products, and lean protein.  Do not eat a lot of foods high in solid fats, added sugars, or salt.  Get regular exercise. This is one of the most important things you can do for your health.  Most adults should  exercise for at least 150 minutes each week. The exercise should increase your heart rate and make you sweat (moderate-intensity exercise).  Most adults should also do strengthening exercises at least twice a week. This is in addition to the moderate-intensity exercise.  Maintain a healthy weight  Body mass index (BMI) is a measurement that can be used to identify possible weight problems. It estimates body fat based on height and weight. Your health care provider can help determine your BMI and help you achieve or maintain a healthy weight.  For females 96 years of age and older:   A BMI below 18.5 is considered underweight.  A BMI of 18.5 to 24.9 is normal.  A BMI of 25 to 29.9 is considered overweight.  A BMI of 30 and above is considered obese.  Watch levels of cholesterol and blood lipids  You should start having your blood tested for lipids and cholesterol at 59 years of age, then have this test every 5 years.  You may need to have your cholesterol levels checked more often if:  Your lipid or cholesterol levels are high.  You are older than 59 years of age.  You are at high risk for heart disease.  CANCER SCREENING   Lung Cancer  Lung cancer screening is recommended for adults 28-27 years old who are at high risk for lung cancer because of a history of smoking.  A yearly low-dose CT scan of the lungs is recommended for people who:  Currently smoke.  Have quit within the  past 15 years.  Have at least a 30-pack-year history of smoking. A pack year is smoking an average of one pack of cigarettes a day for 1 year.  Yearly screening should continue until it has been 15 years since you quit.  Yearly screening should stop if you develop a health problem that would prevent you from having lung cancer treatment.  Breast Cancer  Practice breast self-awareness. This means understanding how your breasts normally appear and feel.  It also means doing regular breast  self-exams. Let your health care provider know about any changes, no matter how small.  If you are in your 20s or 30s, you should have a clinical breast exam (CBE) by a health care provider every 1-3 years as part of a regular health exam.  If you are 26 or older, have a CBE every year. Also consider having a breast X-ray (mammogram) every year.  If you have a family history of breast cancer, talk to your health care provider about genetic screening.  If you are at high risk for breast cancer, talk to your health care provider about having an MRI and a mammogram every year.  Breast cancer gene (BRCA) assessment is recommended for women who have family members with BRCA-related cancers. BRCA-related cancers include:  Breast.  Ovarian.  Tubal.  Peritoneal cancers.  Results of the assessment will determine the need for genetic counseling and BRCA1 and BRCA2 testing. Cervical Cancer Your health care provider may recommend that you be screened regularly for cancer of the pelvic organs (ovaries, uterus, and vagina). This screening involves a pelvic examination, including checking for microscopic changes to the surface of your cervix (Pap test). You may be encouraged to have this screening done every 3 years, beginning at age 65.  For women ages 8-65, health care providers may recommend pelvic exams and Pap testing every 3 years, or they may recommend the Pap and pelvic exam, combined with testing for human papilloma virus (HPV), every 5 years. Some types of HPV increase your risk of cervical cancer. Testing for HPV may also be done on women of any age with unclear Pap test results.  Other health care providers may not recommend any screening for nonpregnant women who are considered low risk for pelvic cancer and who do not have symptoms. Ask your health care provider if a screening pelvic exam is right for you.  If you have had past treatment for cervical cancer or a condition that could lead  to cancer, you need Pap tests and screening for cancer for at least 20 years after your treatment. If Pap tests have been discontinued, your risk factors (such as having a new sexual partner) need to be reassessed to determine if screening should resume. Some women have medical problems that increase the chance of getting cervical cancer. In these cases, your health care provider may recommend more frequent screening and Pap tests. Colorectal Cancer  This type of cancer can be detected and often prevented.  Routine colorectal cancer screening usually begins at 59 years of age and continues through 59 years of age.  Your health care provider may recommend screening at an earlier age if you have risk factors for colon cancer.  Your health care provider may also recommend using home test kits to check for hidden blood in the stool.  A small camera at the end of a tube can be used to examine your colon directly (sigmoidoscopy or colonoscopy). This is done to check for the earliest forms  of colorectal cancer.  Routine screening usually begins at age 61.  Direct examination of the colon should be repeated every 5-10 years through 59 years of age. However, you may need to be screened more often if early forms of precancerous polyps or small growths are found. Skin Cancer  Check your skin from head to toe regularly.  Tell your health care provider about any new moles or changes in moles, especially if there is a change in a mole's shape or color.  Also tell your health care provider if you have a mole that is larger than the size of a pencil eraser.  Always use sunscreen. Apply sunscreen liberally and repeatedly throughout the day.  Protect yourself by wearing long sleeves, pants, a wide-brimmed hat, and sunglasses whenever you are outside. HEART DISEASE, DIABETES, AND HIGH BLOOD PRESSURE   High blood pressure causes heart disease and increases the risk of stroke. High blood pressure is more  likely to develop in:  People who have blood pressure in the high end of the normal range (130-139/85-89 mm Hg).  People who are overweight or obese.  People who are African American.  If you are 74-1 years of age, have your blood pressure checked every 3-5 years. If you are 64 years of age or older, have your blood pressure checked every year. You should have your blood pressure measured twice--once when you are at a hospital or clinic, and once when you are not at a hospital or clinic. Record the average of the two measurements. To check your blood pressure when you are not at a hospital or clinic, you can use:  An automated blood pressure machine at a pharmacy.  A home blood pressure monitor.  If you are between 78 years and 27 years old, ask your health care provider if you should take aspirin to prevent strokes.  Have regular diabetes screenings. This involves taking a blood sample to check your fasting blood sugar level.  If you are at a normal weight and have a low risk for diabetes, have this test once every three years after 59 years of age.  If you are overweight and have a high risk for diabetes, consider being tested at a younger age or more often. PREVENTING INFECTION  Hepatitis B  If you have a higher risk for hepatitis B, you should be screened for this virus. You are considered at high risk for hepatitis B if:  You were born in a country where hepatitis B is common. Ask your health care provider which countries are considered high risk.  Your parents were born in a high-risk country, and you have not been immunized against hepatitis B (hepatitis B vaccine).  You have HIV or AIDS.  You use needles to inject street drugs.  You live with someone who has hepatitis B.  You have had sex with someone who has hepatitis B.  You get hemodialysis treatment.  You take certain medicines for conditions, including cancer, organ transplantation, and autoimmune  conditions. Hepatitis C  Blood testing is recommended for:  Everyone born from [birth year] through 1964.  Anyone with known risk factors for hepatitis C. Sexually transmitted infections (STIs)  You should be screened for sexually transmitted infections (STIs) including gonorrhea and chlamydia if:  You are sexually active and are younger than 59 years of age.  You are older than 59 years of age and your health care provider tells you that you are at risk for this type of infection.  Your  sexual activity has changed since you were last screened and you are at an increased risk for chlamydia or gonorrhea. Ask your health care provider if you are at risk.  If you do not have HIV, but are at risk, it may be recommended that you take a prescription medicine daily to prevent HIV infection. This is called pre-exposure prophylaxis (PrEP). You are considered at risk if:  You are sexually active and do not regularly use condoms or know the HIV status of your partner(s).  You take drugs by injection.  You are sexually active with a partner who has HIV. Talk with your health care provider about whether you are at high risk of being infected with HIV. If you choose to begin PrEP, you should first be tested for HIV. You should then be tested every 3 months for as long as you are taking PrEP.  PREGNANCY   If you are premenopausal and you may become pregnant, ask your health care provider about preconception counseling.  If you may become pregnant, take 400 to 800 micrograms (mcg) of folic acid every day.  If you want to prevent pregnancy, talk to your health care provider about birth control (contraception). OSTEOPOROSIS AND MENOPAUSE   Osteoporosis is a disease in which the bones lose minerals and strength with aging. This can result in serious bone fractures. Your risk for osteoporosis can be identified using a bone density scan.  If you are 75 years of age or older, or if you are at risk for  osteoporosis and fractures, ask your health care provider if you should be screened.  Ask your health care provider whether you should take a calcium or vitamin D supplement to lower your risk for osteoporosis.  Menopause may have certain physical symptoms and risks.  Hormone replacement therapy may reduce some of these symptoms and risks. Talk to your health care provider about whether hormone replacement therapy is right for you.  HOME CARE INSTRUCTIONS   Schedule regular health, dental, and eye exams.  Stay current with your immunizations.   Do not use any tobacco products including cigarettes, chewing tobacco, or electronic cigarettes.  If you are pregnant, do not drink alcohol.  If you are breastfeeding, limit how much and how often you drink alcohol.  Limit alcohol intake to no more than 1 drink per day for nonpregnant women. One drink equals 12 ounces of beer, 5 ounces of wine, or 1 ounces of hard liquor.  Do not use street drugs.  Do not share needles.  Ask your health care provider for help if you need support or information about quitting drugs.  Tell your health care provider if you often feel depressed.  Tell your health care provider if you have ever been abused or do not feel safe at home.   This information is not intended to replace advice given to you by your health care provider. Make sure you discuss any questions you have with your health care provider.   Document Released: 08/21/2010 Document Revised: 02/26/2014 Document Reviewed: 01/07/2013 Elsevier Interactive Patient Education [employer].

## 2015-08-29 NOTE — Progress Notes (Signed)
Patient ID: Andrea Bell, female   DOB: 09/08/1955, 60 y.o.   MRN: [id number]   Subjective:   Andrea Bell is a 60 y.o. female here for a complete physical exam  Interim issues since last visit: right eye tearing up;w ill see eye doctor; both hands are hurting and stiff; going on several months; progressively worse; tried gloves no help; movement and activity makes it worse; right-handed; not red or hot; does happen in feet too  Did have some vaginal bleeding x 2 weeks; last bleeding prior to that was a year before; no pelvic pain, no pressure; no postcoital spotting; no blood in the urine or stool; due for pap smear today she says; had some fibroids, no HPV  USPSTF grade A and B recommendations Alcohol: social Depression screen [facility] 2/9 08/29/2015  Decreased Interest 0  Down, Depressed, Hopeless 0  PHQ - 2 Score 0   Depression: no Hypertension: controlled Obesity: lost six pounds since last visit Tobacco use: nonsmoker; quit 2 years ago; less than 1 ppd x 30 years HIV, hep B, hep C: order today STD testing and prevention (chl/gon/syphilis): declined Lipids: Aug 2017 Glucose: Aug 2017 Colorectal cancer: 2015 UTD Breast cancer: ordered Cervical cancer screening: today Lung cancer: less than 30 pack years Osteoporosis: wait until 74 Fall prevention/vitamin D: take 1000 iu vit D AAA: n/a Aspirin: taking 81 mg coated aspirin Diet: pretty good eater Exercise: occasionally Skin cancer: no worrisome moles  Past Medical History:  Diagnosis Date  . Anemia   . Chronic back pain   . COPD (chronic obstructive pulmonary disease) (HCC)    2l home oxygen  . GERD (gastroesophageal reflux disease)   . HTN (hypertension)   . Insomnia   . Osteoarthritis   . Osteoporosis   . Tachycardia    Past Surgical History:  Procedure Laterality Date  . CYST REMOVAL TRUNK    . CYSTECTOMY     back and side of head  . tear duct surgery     bilateral   Family History  Problem Relation Age of  Onset  . Hypertension Mother   . Diabetes Mellitus II Mother   . Dementia Mother   . Diabetes Mother   . Cancer - Other Father     brain tumor  . Cancer Father     brain tumor  . Breast cancer Maternal Aunt 57  . Cancer Maternal Aunt     ovarian  . Breast cancer Maternal Grandmother 22  . Cancer Maternal Grandmother     breast  . Diabetes Brother   . Hypertension Brother   . COPD Brother   . Cancer Maternal Grandfather     stomach  . Heart disease Paternal Grandmother   . Stroke Paternal Grandfather   mother has arthritis but not crippled from it  Social History  Substance Use Topics  . Smoking status: Former Smoker    Packs/day: 0.50    Years: 15.00    Types: Cigarettes    Quit date: 04/25/2014  . Smokeless tobacco: Never Used  . Alcohol use 0.0 oz/week     Comment: occasional drinks   Review of Systems  Objective:   Vitals:   08/29/15 1404  BP: 118/84  Pulse: 95  Resp: 16  Temp: 98.7 F (37.1 C)  TempSrc: Oral  SpO2: 93%  Weight: 183 lb (83 kg)   Body mass index is 29.99 kg/m. Wt Readings from Last 3 Encounters:  09/29/15 182 lb 1.6 oz (82.6 kg)  08/29/15 183  lb (83 kg)  05/03/15 189 lb (85.7 kg)   Physical Exam  Constitutional: She appears well-developed and well-nourished.  HENT:  Head: Normocephalic and atraumatic.  Eyes: Conjunctivae and EOM are normal. Right eye exhibits no hordeolum. Left eye exhibits no hordeolum. No scleral icterus.  Neck: Carotid bruit is not present. No thyromegaly present.  Cardiovascular: Normal rate, regular rhythm, S1 normal, S2 normal and normal heart sounds.   No extrasystoles are present.  Pulmonary/Chest: Effort normal and breath sounds normal. No respiratory distress. Right breast exhibits no inverted nipple, no mass, no nipple discharge, no skin change and no tenderness. Left breast exhibits no inverted nipple, no mass, no nipple discharge, no skin change and no tenderness. Breasts are symmetrical.  Abdominal: Soft.  Normal appearance and bowel sounds are normal. She exhibits no distension, no abdominal bruit, no pulsatile midline mass and no mass. There is no hepatosplenomegaly. There is no tenderness. No hernia.  Genitourinary: Uterus normal. Pelvic exam was performed with patient prone. There is no rash or lesion on the right labia. There is no rash or lesion on the left labia. Cervix exhibits no motion tenderness. Right adnexum displays no mass, no tenderness and no fullness. Left adnexum displays no mass, no tenderness and no fullness.  Musculoskeletal: Normal range of motion. She exhibits no edema.  Lymphadenopathy:       Head (right side): No submandibular adenopathy present.       Head (left side): No submandibular adenopathy present.    She has no cervical adenopathy.    She has no axillary adenopathy.  Neurological: She is alert. She displays no tremor. No cranial nerve deficit. She exhibits normal muscle tone. Gait normal.  Skin: Skin is warm and dry. No bruising and no ecchymosis noted. No cyanosis. No pallor.  Psychiatric: Her speech is normal and behavior is normal. Thought content normal. Her mood appears not anxious. She does not exhibit a depressed mood.   Assessment/Plan:   Problem List Items Addressed This Visit      Other   Preventative health care - Primary    USPSTF grade A and B recommendations reviewed with patient; age-appropriate recommendations, preventive care, screening tests, etc discussed and encouraged; healthy living encouraged; see AVS for patient education given to patient      Need for hepatitis C screening test    Discussed one-time hep C screening recommendation for individuals born between 1945-1965 per USPSTF guidelines; patient agrees with testing; Hep C Ab ordered      Encounter for screening for HIV    Discussed one-time HIV screening recommendation per USPSTF guidelines; patient agrees with testing; HIV antibody ordered      Breast cancer screening    Relevant Orders   MM DIGITAL SCREENING BILATERAL (Completed)   Bilateral hand pain    Hand xrays, ANA, CRP      Relevant Orders   DG Hand Complete Left (Completed)   DG Hand Complete Right (Completed)    Other Visit Diagnoses    Postmenopausal vaginal bleeding       Relevant Orders   US Transvaginal Non-OB (Completed)   US Pelvis Complete (Completed)   Pap liquid-based and HPV (high risk)   Screening for cervical cancer       Relevant Orders   Pap liquid-based and HPV (high risk)      Orders Placed This Encounter  Procedures  . DG Hand Complete Left    Order Specific Question:   Reason for Exam (SYMPTOM  OR DIAGNOSIS  REQUIRED)    Answer:   bilateral hand pain, joints    Order Specific Question:   Is the patient pregnant?    Answer:   No    Order Specific Question:   Preferred imaging location?    Answer:   [facility]  . DG Hand Complete Right    Order Specific Question:   Reason for Exam (SYMPTOM  OR DIAGNOSIS REQUIRED)    Answer:   bilateral hand pain    Order Specific Question:   Is the patient pregnant?    Answer:   No    Order Specific Question:   Preferred imaging location?    Answer:   [facility]  . MM DIGITAL SCREENING BILATERAL    Order Specific Question:   Reason for Exam (SYMPTOM  OR DIAGNOSIS REQUIRED)    Answer:   screening    Order Specific Question:   Is the patient pregnant?    Answer:   No    Order Specific Question:   Preferred imaging location?    Answer:   [facility]  . US Transvaginal Non-OB    Scheduling Instructions:     Mondays, Tuesdays, or Thursdays please and thank you    Order Specific Question:   Reason for Exam (SYMPTOM  OR DIAGNOSIS REQUIRED)    Answer:   postmenopausal vaginal bleeding    Order Specific Question:   Preferred imaging location?    Answer:   [facility]  . US Pelvis Complete    Scheduling Instructions:     Mondays, Tuesdays, or Thursdays please and thank you    Order Specific  Question:   Reason for Exam (SYMPTOM  OR DIAGNOSIS REQUIRED)    Answer:   postmenopausal bleeding    Order Specific Question:   Preferred imaging location?    Answer:   [facility]    Follow up plan: Return in about 5 weeks (around 10/05/2015), or or just after, for fasting labs and visit with Dr. Sanda Klein.  An After Visit Summary was printed and given to the patient.  hiv and hep C and ANA, crp and xrays

## 2015-08-30 ENCOUNTER — Other Ambulatory Visit: Payer: Self-pay | Admitting: Family Medicine

## 2015-09-01 ENCOUNTER — Ambulatory Visit
Admission: RE | Admit: 2015-09-01 | Discharge: 2015-09-01 | Disposition: A | Discharge status: Home / Self Care | Payer: Medicare PPO | Source: Ambulatory Visit | Attending: Family Medicine | Admitting: Family Medicine

## 2015-09-01 DIAGNOSIS — N950 Postmenopausal bleeding: Secondary | ICD-10-CM | POA: Insufficient documentation

## 2015-09-01 DIAGNOSIS — D250 Submucous leiomyoma of uterus: Secondary | ICD-10-CM | POA: Insufficient documentation

## 2015-09-02 LAB — PAP LB AND HPV HIGH-RISK
HPV, high-risk: POSITIVE — AB
PAP Smear Comment: 0

## 2015-09-07 ENCOUNTER — Telehealth: Payer: Self-pay | Admitting: Family Medicine

## 2015-09-07 DIAGNOSIS — R9389 Abnormal findings on diagnostic imaging of other specified body structures: Secondary | ICD-10-CM | POA: Insufficient documentation

## 2015-09-07 DIAGNOSIS — N950 Postmenopausal bleeding: Secondary | ICD-10-CM | POA: Insufficient documentation

## 2015-09-07 NOTE — Telephone Encounter (Signed)
I called and left message, calling about [place] results

## 2015-09-08 ENCOUNTER — Ambulatory Visit
Admission: RE | Admit: 2015-09-08 | Discharge: 2015-09-08 | Disposition: A | Discharge status: Home / Self Care | Payer: Medicare PPO | Source: Ambulatory Visit | Attending: Family Medicine | Admitting: Family Medicine

## 2015-09-08 ENCOUNTER — Telehealth: Payer: Self-pay | Admitting: Family Medicine

## 2015-09-08 DIAGNOSIS — Z1231 Encounter for screening mammogram for malignant neoplasm of breast: Secondary | ICD-10-CM | POA: Diagnosis present

## 2015-09-08 NOTE — Telephone Encounter (Signed)
Pt returned your call about her ultra sound results. Please advise.

## 2015-09-09 NOTE — Telephone Encounter (Signed)
I left another message 

## 2015-09-15 NOTE — Telephone Encounter (Signed)
I called again, left another msg; I really want to go over her test results with her

## 2015-09-16 NOTE — Telephone Encounter (Signed)
I left another message; I explained that I'm trying to reach her about her test results; please call the office on Monday and staff can pull me out of a room; I'd really like to talk with her soon

## 2015-09-18 NOTE — Telephone Encounter (Signed)
See the other phone note with running messages about scan results

## 2015-09-20 DIAGNOSIS — J449 Chronic obstructive pulmonary disease, unspecified: Secondary | ICD-10-CM | POA: Diagnosis not present

## 2015-09-21 ENCOUNTER — Other Ambulatory Visit: Payer: Self-pay

## 2015-09-21 MED ORDER — METOPROLOL SUCCINATE ER 25 MG PO TB24: 12.5000 mg | ORAL_TABLET | Freq: Every day | ORAL | 1 refills | Status: DC

## 2015-09-21 MED ORDER — TRAZODONE HCL 100 MG PO TABS: 100.0000 mg | ORAL_TABLET | Freq: Every evening | ORAL | 1 refills | Status: DC | PRN

## 2015-09-21 MED ORDER — TIZANIDINE HCL 4 MG PO TABS: 4.0000 mg | ORAL_TABLET | Freq: Three times a day (TID) | ORAL | 0 refills | Status: DC | PRN

## 2015-09-21 MED ORDER — LISINOPRIL-HYDROCHLOROTHIAZIDE 10-12.5 MG PO TABS: 1.0000 | ORAL_TABLET | Freq: Every day | ORAL | 0 refills | Status: DC

## 2015-09-21 MED ORDER — POTASSIUM CHLORIDE ER 10 MEQ PO TBCR
10.0000 meq | EXTENDED_RELEASE_TABLET | Freq: Every day | ORAL | 0 refills | Status: DC
Start: 2015-09-21 — End: 2016-02-10

## 2015-09-21 NOTE — Telephone Encounter (Signed)
Left voice mail

## 2015-09-21 NOTE — Telephone Encounter (Signed)
Have patient STOP the estradiol/norethindrone (Activella) She is due for a 6 month f/u visit for prediabetes and high cholesterol and blood pressure in mid-August; I'll suggest she go ahead and get on the scheduled and come fasting for her labs that day, sometime in the next month I'll refill all other medicines, but I want her to not take any more of the hormone; that may be part of the issue with her uterine lining, so stop taking that

## 2015-09-21 NOTE — Telephone Encounter (Signed)
I talked with patient yesterday (late entry phone note) Explained test results; referral to GYN; important to see them

## 2015-09-29 ENCOUNTER — Ambulatory Visit (INDEPENDENT_AMBULATORY_CARE_PROVIDER_SITE_OTHER): Payer: Medicare PPO | Admitting: Family Medicine

## 2015-09-29 ENCOUNTER — Encounter: Payer: Self-pay | Admitting: Family Medicine

## 2015-09-29 DIAGNOSIS — R7301 Impaired fasting glucose: Secondary | ICD-10-CM | POA: Diagnosis not present

## 2015-09-29 DIAGNOSIS — R938 Abnormal findings on diagnostic imaging of other specified body structures: Secondary | ICD-10-CM | POA: Diagnosis not present

## 2015-09-29 DIAGNOSIS — J309 Allergic rhinitis, unspecified: Secondary | ICD-10-CM | POA: Diagnosis not present

## 2015-09-29 DIAGNOSIS — I10 Essential (primary) hypertension: Secondary | ICD-10-CM | POA: Diagnosis not present

## 2015-09-29 DIAGNOSIS — E785 Hyperlipidemia, unspecified: Secondary | ICD-10-CM | POA: Diagnosis not present

## 2015-09-29 DIAGNOSIS — R9389 Abnormal findings on diagnostic imaging of other specified body structures: Secondary | ICD-10-CM

## 2015-09-29 DIAGNOSIS — Z5181 Encounter for therapeutic drug level monitoring: Secondary | ICD-10-CM

## 2015-09-29 DIAGNOSIS — N950 Postmenopausal bleeding: Secondary | ICD-10-CM | POA: Diagnosis not present

## 2015-09-29 DIAGNOSIS — J3089 Other allergic rhinitis: Secondary | ICD-10-CM

## 2015-09-29 DIAGNOSIS — M810 Age-related osteoporosis without current pathological fracture: Secondary | ICD-10-CM | POA: Diagnosis not present

## 2015-09-29 LAB — COMPREHENSIVE METABOLIC PANEL
ALBUMIN: 4.3 g/dL (ref 3.6–5.1)
ALT: 22 U/L (ref 6–29)
AST: 22 U/L (ref 10–35)
Alkaline Phosphatase: 64 U/L (ref 33–130)
BUN: 23 mg/dL (ref 7–25)
CHLORIDE: 100 mmol/L (ref 98–110)
CO2: 29 mmol/L (ref 20–31)
Calcium: 9.7 mg/dL (ref 8.6–10.4)
Creat: 0.91 mg/dL (ref 0.50–1.05)
Glucose, Bld: 117 mg/dL — ABNORMAL HIGH (ref 65–99)
POTASSIUM: 3.6 mmol/L (ref 3.5–5.3)
Sodium: 138 mmol/L (ref 135–146)
TOTAL PROTEIN: 7.1 g/dL (ref 6.1–8.1)
Total Bilirubin: 0.6 mg/dL (ref 0.2–1.2)

## 2015-09-29 LAB — CBC WITH DIFFERENTIAL/PLATELET
BASOS PCT: 1 %
Basophils Absolute: 59 cells/uL (ref 0–200)
Eosinophils Absolute: 118 cells/uL (ref 15–500)
Eosinophils Relative: 2 %
HCT: 45.2 % — ABNORMAL HIGH (ref 35.0–45.0)
Hemoglobin: 15.1 g/dL (ref 11.7–15.5)
LYMPHS ABS: 1475 {cells}/uL (ref 850–3900)
LYMPHS PCT: 25 %
MCH: 31.3 pg (ref 27.0–33.0)
MCHC: 33.4 g/dL (ref 32.0–36.0)
MCV: 93.8 fL (ref 80.0–100.0)
MONO ABS: 590 {cells}/uL (ref 200–950)
MONOS PCT: 10 %
MPV: 9.3 fL (ref 7.5–12.5)
Neutro Abs: 3658 cells/uL (ref 1500–7800)
Neutrophils Relative %: 62 %
PLATELETS: 288 10*3/uL (ref 140–400)
RBC: 4.82 MIL/uL (ref 3.80–5.10)
RDW: 13.6 % (ref 11.0–15.0)
WBC: 5.9 10*3/uL (ref 3.8–10.8)

## 2015-09-29 LAB — LIPID PANEL
Cholesterol: 221 mg/dL — ABNORMAL HIGH (ref 125–200)
HDL: 64 mg/dL (ref >=46)
LDL Cholesterol: 124 mg/dL (ref <130)
TRIGLYCERIDES: 164 mg/dL — AB (ref <150)
Total CHOL/HDL Ratio: 3.5 Ratio (ref <=5.0)
VLDL: 33 mg/dL — ABNORMAL HIGH (ref <30)

## 2015-09-29 MED ORDER — CETIRIZINE HCL 10 MG PO TABS: 10.0000 mg | ORAL_TABLET | Freq: Every day | ORAL | 3 refills | Status: DC | PRN

## 2015-09-29 NOTE — Assessment & Plan Note (Signed)
Check A1c; avoid whites 

## 2015-09-29 NOTE — Progress Notes (Signed)
BP 126/82 (BP Location: Left Arm, Patient Position: Sitting, Cuff Size: Normal)   Pulse 75   Temp 98.8 F (37.1 C) (Oral)   Resp 16   Ht 5\' 6"  (1.676 m)   Wt 182 lb 1.6 oz (82.6 kg)   LMP 09/08/2015 Comment: On and off  SpO2 93%   BMI 29.39 kg/m    Subjective:    Patient ID: Andrea Bell, female    DOB: [birth year], 60 y.o.   MRN: [id number]  HPI: Andrea Bell is a 60 y.o. female  Chief Complaint  Patient presents with  . Follow-up    labwork   Postmenopausal bleeding; sees gyn next week; since last visit, she has stopped the birth control pills; she has been on those for 3-4 years; she went through menopause about a year ago; having night sweats, hot flashes; still spotting enough to wear a pad  She needs refills of the cetirizine for allergies  Prediabetes; check A1c; no sugary drinks; does eat white bread, potatoes; loves pasta  Depression screen PHQ 2/9 08/29/2015  Decreased Interest 0  Down, Depressed, Hopeless 0  PHQ - 2 Score 0   Relevant past medical, surgical, family and social history reviewed Past Medical History:  Diagnosis Date  . Anemia   . Chronic back pain   . COPD (chronic obstructive pulmonary disease) (HCC)    2l home oxygen  . GERD (gastroesophageal reflux disease)   . HTN (hypertension)   . Insomnia   . Osteoarthritis   . Osteoporosis   . Tachycardia    Past Surgical History:  Procedure Laterality Date  . CYST REMOVAL TRUNK    . CYSTECTOMY     back and side of head  . tear duct surgery     bilateral   Family History  Problem Relation Age of Onset  . Hypertension Mother   . Diabetes Mellitus II Mother   . Dementia Mother   . Diabetes Mother   . Cancer - Other Father     brain tumor  . Cancer Father     brain tumor  . Breast cancer Maternal Aunt 57  . Cancer Maternal Aunt     ovarian  . Breast cancer Maternal Grandmother 38  . Cancer Maternal Grandmother     breast  . Diabetes Brother   . Hypertension Brother   . COPD  Brother   . Cancer Maternal Grandfather     stomach  . Heart disease Paternal Grandmother   . Stroke Paternal Grandfather    Social History  Substance Use Topics  . Smoking status: Former Smoker    Packs/day: 0.50    Years: 15.00    Types: Cigarettes    Quit date: 04/25/2014  . Smokeless tobacco: Never Used  . Alcohol use 0.0 oz/week     Comment: occasional drinks   Interim medical history since last visit reviewed. Allergies and medications reviewed  Review of Systems Per HPI unless specifically indicated above     Objective:    BP 126/82 (BP Location: Left Arm, Patient Position: Sitting, Cuff Size: Normal)   Pulse 75   Temp 98.8 F (37.1 C) (Oral)   Resp 16   Ht 5\' 6"  (1.676 m)   Wt 182 lb 1.6 oz (82.6 kg)   LMP 09/08/2015 Comment: On and off  SpO2 93%   BMI 29.39 kg/m   Wt Readings from Last 3 Encounters:  09/29/15 182 lb 1.6 oz (82.6 kg)  08/29/15 183 lb (83 kg)  05/03/15  189 lb (85.7 kg)    Physical Exam  Constitutional: She appears well-developed and well-nourished. No distress.  HENT:  Head: Normocephalic and atraumatic.  Eyes: EOM are normal. No scleral icterus.  Neck: No thyromegaly present.  Cardiovascular: Normal rate, regular rhythm and normal heart sounds.   No murmur heard. Pulmonary/Chest: Effort normal and breath sounds normal. No respiratory distress. She has no wheezes.  Abdominal: Soft. Bowel sounds are normal. She exhibits no distension.  Musculoskeletal: Normal range of motion. She exhibits no edema.  Neurological: She is alert. She exhibits normal muscle tone.  Skin: Skin is warm and dry. She is not diaphoretic. No pallor.  Psychiatric: She has a normal mood and affect. Her behavior is normal. Judgment and thought content normal.      Assessment & Plan:   Problem List Items Addressed This Visit      Cardiovascular and Mediastinum   Essential hypertension, benign    Well-controlled; try DASH guidelines        Respiratory    Perennial allergic rhinitis    Continue antihistamine, refilled        Endocrine   Impaired fasting glucose    Check A1c; avoid "whites"      Relevant Orders   Hemoglobin A1c (Completed)   Lipid panel (Completed)     Musculoskeletal and Integument   Osteoporosis    Order DEXA; three servings of calcium a day; fall precautions        Genitourinary   Endometrial thickening on ultra sound    See gyn next week; reviewed [place]        Other   Postmenopausal bleeding    Check CBC      Relevant Orders   CBC with Differential/Platelet (Completed)   Medication monitoring encounter    Check labs      Relevant Orders   Comprehensive metabolic panel (Completed)    Other Visit Diagnoses   None.     Follow up plan: Return in about 6 months (around 03/31/2016) for prediabetes and blood pressure; 3 months for hydrocodone.  An after-visit summary was printed and given to the patient at [facility].  Please see the patient instructions which may contain other information and recommendations beyond what is mentioned above in the assessment and plan.  Meds ordered this encounter  Medications  . cetirizine (ZYRTEC) 10 MG tablet    Sig: Take 1 tablet (10 mg total) by mouth daily as needed for allergies.    Dispense:  90 tablet    Refill:  3    Orders Placed This Encounter  Procedures  . Hemoglobin A1c  . Comprehensive metabolic panel  . CBC with Differential/Platelet  . Lipid panel

## 2015-09-29 NOTE — Assessment & Plan Note (Signed)
Check labs 

## 2015-09-29 NOTE — Patient Instructions (Addendum)
Your goal blood pressure is less than 140 mmHg on top. Try to follow the DASH guidelines (DASH stands for Dietary Approaches to Stop Hypertension) Try to limit the sodium in your diet.  Ideally, consume less than 1.5 grams (less than 1,500mg ) per day. Do not add salt when cooking or at the table.  Check the sodium amount on labels when shopping, and choose items lower in sodium when given a choice. Avoid or limit foods that already contain a lot of sodium. Eat a diet rich in fruits and vegetables and whole grains.  Keep the appointment with the gynecologist  Start vitamin D3 and take 1,000 iu daily for bone health  DASH Eating Plan DASH stands for "Dietary Approaches to Stop Hypertension." The DASH eating plan is a healthy eating plan that has been shown to reduce high blood pressure (hypertension). Additional health benefits may include reducing the risk of type 2 diabetes mellitus, heart disease, and stroke. The DASH eating plan may also help with weight loss. WHAT DO I NEED TO KNOW ABOUT THE DASH EATING PLAN? For the DASH eating plan, you will follow these general guidelines:  Choose foods with a percent daily value for sodium of less than 5% (as listed on the food label).  Use salt-free seasonings or herbs instead of table salt or sea salt.  Check with your health care provider or pharmacist before using salt substitutes.  Eat lower-sodium products, often labeled as "lower sodium" or "no salt added."  Eat fresh foods.  Eat more vegetables, fruits, and low-fat dairy products.  Choose whole grains. Look for the word "whole" as the first word in the ingredient list.  Choose fish and skinless chicken or [place] more often than red meat. Limit fish, poultry, and meat to 6 oz (170 g) each day.  Limit sweets, desserts, sugars, and sugary drinks.  Choose heart-healthy fats.  Limit cheese to 1 oz (28 g) per day.  Eat more home-cooked food and less restaurant, buffet, and fast  food.  Limit fried foods.  Cook foods using methods other than frying.  Limit canned vegetables. If you do use them, rinse them well to decrease the sodium.  When eating at a restaurant, ask that your food be prepared with less salt, or no salt if possible. WHAT FOODS CAN I EAT? Seek help from a dietitian for individual calorie needs. Grains Whole grain or whole wheat bread. Brown rice. Whole grain or whole wheat pasta. Quinoa, bulgur, and whole grain cereals. Low-sodium cereals. Corn or whole wheat flour tortillas. Whole grain cornbread. Whole grain crackers. Low-sodium crackers. Vegetables Fresh or frozen vegetables (raw, steamed, roasted, or grilled). Low-sodium or reduced-sodium tomato and vegetable juices. Low-sodium or reduced-sodium tomato sauce and paste. Low-sodium or reduced-sodium canned vegetables.  Fruits All fresh, canned (in natural juice), or frozen fruits. Meat and Other Protein Products Ground beef (85% or leaner), grass-fed beef, or beef trimmed of fat. Skinless chicken or [place]. Ground chicken or [place]. Pork trimmed of fat. All fish and seafood. Eggs. Dried beans, peas, or lentils. Unsalted nuts and seeds. Unsalted canned beans. Dairy Low-fat dairy products, such as skim or 1% milk, 2% or reduced-fat cheeses, low-fat ricotta or cottage cheese, or plain low-fat yogurt. Low-sodium or reduced-sodium cheeses. Fats and Oils Tub margarines without trans fats. Light or reduced-fat mayonnaise and salad dressings (reduced sodium). Avocado. Safflower, olive, or canola oils. Natural peanut or almond butter. Other Unsalted popcorn and pretzels. The items listed above may not be a complete list of  recommended foods or beverages. Contact your dietitian for more options. WHAT FOODS ARE NOT RECOMMENDED? Grains White bread. White pasta. White rice. Refined cornbread. Bagels and croissants. Crackers that contain trans fat. Vegetables Creamed or fried vegetables. Vegetables in a  cheese sauce. Regular canned vegetables. Regular canned tomato sauce and paste. Regular tomato and vegetable juices. Fruits Dried fruits. Canned fruit in light or heavy syrup. Fruit juice. Meat and Other Protein Products Fatty cuts of meat. Ribs, chicken wings, bacon, sausage, bologna, salami, chitterlings, fatback, hot dogs, bratwurst, and packaged luncheon meats. Salted nuts and seeds. Canned beans with salt. Dairy Whole or 2% milk, cream, half-and-half, and cream cheese. Whole-fat or sweetened yogurt. Full-fat cheeses or blue cheese. Nondairy creamers and whipped toppings. Processed cheese, cheese spreads, or cheese curds. Condiments Onion and garlic salt, seasoned salt, table salt, and sea salt. Canned and packaged gravies. Worcestershire sauce. Tartar sauce. Barbecue sauce. Teriyaki sauce. Soy sauce, including reduced sodium. Steak sauce. Fish sauce. Oyster sauce. Cocktail sauce. Horseradish. Ketchup and mustard. Meat flavorings and tenderizers. Bouillon cubes. Hot sauce. Tabasco sauce. Marinades. Taco seasonings. Relishes. Fats and Oils Butter, stick margarine, lard, shortening, ghee, and bacon fat. Coconut, palm kernel, or palm oils. Regular salad dressings. Other Pickles and olives. Salted popcorn and pretzels. The items listed above may not be a complete list of foods and beverages to avoid. Contact your dietitian for more information. WHERE CAN I FIND MORE INFORMATION? National Heart, Lung, and Blood Institute: travelstabloid.com   This information is not intended to replace advice given to you by your health care provider. Make sure you discuss any questions you have with your health care provider.   Document Released: 01/25/2011 Document Revised: 02/26/2014 Document Reviewed: 12/10/2012 Elsevier Interactive Patient Education [employer].

## 2015-09-29 NOTE — Assessment & Plan Note (Signed)
See gyn next week; reviewed [place]

## 2015-09-29 NOTE — Assessment & Plan Note (Addendum)
Order DEXA; three servings of calcium a day; fall precautions

## 2015-09-29 NOTE — Assessment & Plan Note (Signed)
Well-controlled; try DASH guidelines 

## 2015-09-29 NOTE — Assessment & Plan Note (Signed)
Continue antihistamine, refilled

## 2015-09-29 NOTE — Assessment & Plan Note (Signed)
Check CBC 

## 2015-09-30 LAB — HEMOGLOBIN A1C
Hgb A1c MFr Bld: 5.7 % — ABNORMAL HIGH (ref <5.7)
MEAN PLASMA GLUCOSE: 117 mg/dL

## 2015-10-04 DIAGNOSIS — N858 Other specified noninflammatory disorders of uterus: Secondary | ICD-10-CM | POA: Diagnosis not present

## 2015-10-04 DIAGNOSIS — N950 Postmenopausal bleeding: Secondary | ICD-10-CM | POA: Diagnosis not present

## 2015-10-11 ENCOUNTER — Encounter: Payer: Self-pay | Admitting: Family Medicine

## 2015-10-11 DIAGNOSIS — E785 Hyperlipidemia, unspecified: Secondary | ICD-10-CM | POA: Insufficient documentation

## 2015-10-30 DIAGNOSIS — Z114 Encounter for screening for human immunodeficiency virus [HIV]: Secondary | ICD-10-CM | POA: Insufficient documentation

## 2015-10-30 DIAGNOSIS — Z1159 Encounter for screening for other viral diseases: Secondary | ICD-10-CM | POA: Insufficient documentation

## 2015-10-30 NOTE — Assessment & Plan Note (Signed)
USPSTF grade A and B recommendations reviewed with patient; age-appropriate recommendations, preventive care, screening tests, etc discussed and encouraged; healthy living encouraged; see AVS for patient education given to patient  

## 2015-10-30 NOTE — Assessment & Plan Note (Signed)
Discussed one-time HIV screening recommendation per USPSTF guidelines; patient agrees with testing; HIV antibody ordered 

## 2015-10-30 NOTE — Assessment & Plan Note (Signed)
Hand xrays, ANA, CRP

## 2015-10-30 NOTE — Assessment & Plan Note (Signed)
Discussed one-time hep C screening recommendation for individuals born between 1945-1965 per USPSTF guidelines; patient agrees with testing; Hep C Ab ordered 

## 2015-11-03 ENCOUNTER — Encounter: Payer: Self-pay | Admitting: Family Medicine

## 2015-11-04 ENCOUNTER — Ambulatory Visit: Payer: Medicare PPO | Attending: Specialist

## 2015-11-04 DIAGNOSIS — G4733 Obstructive sleep apnea (adult) (pediatric): Secondary | ICD-10-CM | POA: Diagnosis not present

## 2015-11-24 ENCOUNTER — Encounter: Payer: Self-pay | Admitting: Family Medicine

## 2015-11-24 ENCOUNTER — Ambulatory Visit (INDEPENDENT_AMBULATORY_CARE_PROVIDER_SITE_OTHER): Payer: Medicare PPO | Admitting: Family Medicine

## 2015-11-24 VITALS — BP 132/78 | HR 100 | Temp 98.5°F | Resp 16 | Wt 177.0 lb

## 2015-11-24 DIAGNOSIS — J432 Centrilobular emphysema: Secondary | ICD-10-CM

## 2015-11-24 DIAGNOSIS — I10 Essential (primary) hypertension: Secondary | ICD-10-CM

## 2015-11-24 DIAGNOSIS — R7301 Impaired fasting glucose: Secondary | ICD-10-CM

## 2015-11-24 DIAGNOSIS — Z01818 Encounter for other preprocedural examination: Secondary | ICD-10-CM | POA: Diagnosis not present

## 2015-11-24 DIAGNOSIS — R9431 Abnormal electrocardiogram [ECG] [EKG]: Secondary | ICD-10-CM | POA: Diagnosis not present

## 2015-11-24 DIAGNOSIS — M79641 Pain in right hand: Secondary | ICD-10-CM | POA: Diagnosis not present

## 2015-11-24 NOTE — Assessment & Plan Note (Signed)
Should not impact healing post-operatively

## 2015-11-24 NOTE — Assessment & Plan Note (Signed)
See pulmonologist, Dr. Raul Del, for clearance prior to surgery

## 2015-11-24 NOTE — Assessment & Plan Note (Signed)
Controlled for surgery

## 2015-11-24 NOTE — Assessment & Plan Note (Addendum)
Compared to previous, unchanged, sinus tach with PACs, RBBB, LAFB; refer to cardiologist for pre-operative cardiac clearance

## 2015-11-24 NOTE — Assessment & Plan Note (Signed)
Try paraffin wax treatment; reviewed xray report with her; explained no erosive arthritis; if bracing not helping, then stop

## 2015-11-24 NOTE — Patient Instructions (Addendum)
Please do see the cardiologist for cardiac clearance before surgery Please see Dr. Raul Del for clearance for your lungs before surgery I'll clear you medically and we'll await pulmonary and cardiac clearance from your specialists Do call your gynecologist about hot flashes  Menopause Menopause is the normal time of life when menstrual periods stop completely. Menopause is complete when you have missed 12 consecutive menstrual periods. It usually occurs between the ages of 64 years and 46 years. Very rarely does a woman develop menopause before the age of 18 years. At menopause, your ovaries stop producing the female hormones estrogen and progesterone. This can cause undesirable symptoms and also affect your health. Sometimes the symptoms may occur 4-5 years before the menopause begins. There is no relationship between menopause and:  Oral contraceptives.  Number of children you had.  Race.  The age your menstrual periods started (menarche). Heavy smokers and very thin women may develop menopause earlier in life. CAUSES  The ovaries stop producing the female hormones estrogen and progesterone.  Other causes include:  Surgery to remove both ovaries.  The ovaries stop functioning for no known reason.  Tumors of the pituitary gland in the brain.  Medical disease that affects the ovaries and hormone production.  Radiation treatment to the abdomen or pelvis.  Chemotherapy that affects the ovaries. SYMPTOMS   Hot flashes.  Night sweats.  Decrease in sex drive.  Vaginal dryness and thinning of the vagina causing painful intercourse.  Dryness of the skin and developing wrinkles.  Headaches.  Tiredness.  Irritability.  Memory problems.  Weight gain.  Bladder infections.  Hair growth of the face and chest.  Infertility. More serious symptoms include:  Loss of bone (osteoporosis) causing breaks (fractures).  Depression.  Hardening and narrowing of the arteries  (atherosclerosis) causing heart attacks and strokes. DIAGNOSIS   When the menstrual periods have stopped for 12 straight months.  Physical exam.  Hormone studies of the blood. TREATMENT  There are many treatment choices and nearly as many questions about them. The decisions to treat or not to treat menopausal changes is an individual choice made with your health care provider. Your health care provider can discuss the treatments with you. Together, you can decide which treatment will work best for you. Your treatment choices may include:   Hormone therapy (estrogen and progesterone).  Non-hormonal medicines.  Treating the individual symptoms with medicine (for example antidepressants for depression).  Herbal medicines that may help specific symptoms.  Counseling by a psychiatrist or psychologist.  Group therapy.  Lifestyle changes including:  Eating healthy.  Regular exercise.  Limiting caffeine and alcohol.  Stress management and meditation.  No treatment. HOME CARE INSTRUCTIONS   Take the medicine your health care provider gives you as directed.  Get plenty of sleep and rest.  Exercise regularly.  Eat a diet that contains calcium (good for the bones) and soy products (acts like estrogen hormone).  Avoid alcoholic beverages.  Do not smoke.  If you have hot flashes, dress in layers.  Take supplements, calcium, and vitamin D to strengthen bones.  You can use over-the-counter lubricants or moisturizers for vaginal dryness.  Group therapy is sometimes very helpful.  Acupuncture may be helpful in some cases. SEEK MEDICAL CARE IF:   You are not sure you are in menopause.  You are having menopausal symptoms and need advice and treatment.  You are still having menstrual periods after age 34 years.  You have pain with intercourse.  Menopause is complete (  no menstrual period for 12 months) and you develop vaginal bleeding.  You need a referral to a  specialist (gynecologist, psychiatrist, or psychologist) for treatment. SEEK IMMEDIATE MEDICAL CARE IF:   You have severe depression.  You have excessive vaginal bleeding.  You fell and think you have a broken bone.  You have pain when you urinate.  You develop leg or chest pain.  You have a fast pounding heart beat (palpitations).  You have severe headaches.  You develop vision problems.  You feel a lump in your breast.  You have abdominal pain or severe indigestion.   This information is not intended to replace advice given to you by your health care provider. Make sure you discuss any questions you have with your health care provider.   Document Released: 04/28/2003 Document Revised: 10/08/2012 Document Reviewed: 09/04/2012 Elsevier Interactive Patient Education [employer].

## 2015-11-24 NOTE — Assessment & Plan Note (Signed)
Seen today prior to hystectomy; patient will need both cardiac clearance and pulmonary clearance prior to surgery; I can provide medical clearance otherwise; it will be okay to stop her aspirin 7 days prior to surgery, then resume day after surgery if okay with surgeon

## 2015-11-24 NOTE — Progress Notes (Signed)
BP 132/78   Pulse 100   Temp 98.5 F (36.9 C) (Oral)   Resp 16   Wt 177 lb (80.3 kg)   LMP 10/27/2015   SpO2 95%   BMI 28.57 kg/m    Subjective:    Patient ID: Andrea Bell, female    DOB: 05-24-1955, 60 y.o.   MRN: [id number]  HPI: Andrea Bell is a 60 y.o. female  Chief Complaint  Patient presents with  . surgical clearance    hysterectomy   Patient is here for surgical clearance prior to hysterectomy She will go October 17th to the hospital for labwork No abscess or boil or sores No fevers, but having hot flashes No chest pain No confusion or headaches or stroke-like symptoms No trouble with previous anesthesia No burning with urination No easy bruising or bleeding Has carpal tunnel in the right hand and she is right-handed She takes a beta-blocker (metoprolol succinate) She takes an aspirin 81 mg daily She has COPD; saw pulmonologist earlier this year She gets short of breath with just a little bit of walking Her last CXR was with Dr. Max Sane on Decemeber 3, 2016: --------------------------------------------------------- CLINICAL DATA:  COPD exacerbation.  EXAM: CHEST  2 VIEW  COMPARISON:  Radiographs 01/16/2015.  CT 01/18/2015.  FINDINGS: The heart size and mediastinal contours are stable. The lungs remain hyperinflated with stable chronic atelectasis or scarring at the left lung base. The right lung is clear. There is no pleural effusion or pneumothorax. The bones appear unchanged.  IMPRESSION: Stable chest. Chronic obstructive pulmonary disease with chronic left lower lobe atelectasis or scarring.  Electronically Signed   By: Richardean Sale M.D.   On: 01/22/2015 10:43 -------------------------------------------------------------  She has prediabetes; last A1c was 5.7 on September 29, 2015 Last cholesterol panel reviewed; total 221, TG 164, HDL 64, LDL 124 Still having some right hand pain; going on a long time; had xrays; reviewed  together today; she had seen a doctor at [facility] and they put her in a brace; not really helping  -------------------------------------------------------------- CLINICAL DATA:  60 year old female with progressive chronic bilateral hand pain. No known injury. Initial encounter.  EXAM: RIGHT HAND - COMPLETE 3+ VIEW  COMPARISON:  Contralateral left hand series from today reported separately.  FINDINGS: Distal radius and ulna appear intact; there is mild cortical irregularity at the ulnar styloid which may be the sequelae of remote trauma. Normal carpal bone alignment and joint spaces. Metacarpals and phalanges intact; there might be a healed remote fracture of the fifth metacarpal shaft. No MCP or IP joint space loss or periarticular erosion identified. No acute osseous abnormality identified.  IMPRESSION: Negative radiographic appearance of the right hand aside from possible remote posttraumatic changes to the ulnar styloid and fifth metacarpal.  Electronically Signed   By: Genevie Ann M.D.   On: 08/29/2015 16:13 ------------------------------------------------------------  Depression screen [facility] 2/9 11/24/2015 08/29/2015  Decreased Interest 0 0  Down, Depressed, Hopeless 1 0  PHQ - 2 Score 1 0   Relevant past medical, surgical, family and social history reviewed Past Medical History:  Diagnosis Date  . Anemia   . Chronic back pain   . COPD (chronic obstructive pulmonary disease) (HCC)    2l home oxygen  . GERD (gastroesophageal reflux disease)   . HTN (hypertension)   . Insomnia   . Osteoarthritis   . Osteoporosis   . Tachycardia    Past Surgical History:  Procedure Laterality Date  . CYST REMOVAL TRUNK    .  CYSTECTOMY     back and side of head  . tear duct surgery     bilateral   Family History  Problem Relation Age of Onset  . Hypertension Mother   . Diabetes Mellitus II Mother   . Dementia Mother   . Diabetes Mother   . Cancer - Other Father      brain tumor  . Cancer Father     brain tumor  . Breast cancer Maternal Aunt 57  . Cancer Maternal Aunt     ovarian  . Breast cancer Maternal Grandmother 74  . Cancer Maternal Grandmother     breast  . Diabetes Brother   . Hypertension Brother   . COPD Brother   . Cancer Maternal Grandfather     stomach  . Heart disease Paternal Grandmother   . Stroke Paternal Grandfather    Social History  Substance Use Topics  . Smoking status: Former Smoker    Packs/day: 0.50    Years: 15.00    Types: Cigarettes    Quit date: 04/25/2014  . Smokeless tobacco: Never Used  . Alcohol use 0.0 oz/week     Comment: occasional drinks   Interim medical history since last visit reviewed. Allergies and medications reviewed  Review of Systems Per HPI unless specifically indicated above     Objective:    BP 132/78   Pulse 100   Temp 98.5 F (36.9 C) (Oral)   Resp 16   Wt 177 lb (80.3 kg)   LMP 10/27/2015   SpO2 95%   BMI 28.57 kg/m   Wt Readings from Last 3 Encounters:  11/24/15 177 lb (80.3 kg)  09/29/15 182 lb 1.6 oz (82.6 kg)  08/29/15 183 lb (83 kg)    Physical Exam  Constitutional: She appears well-developed and well-nourished. No distress.  Weight down five pounds over last several weeks  HENT:  Head: Normocephalic and atraumatic.  Eyes: EOM are normal. No scleral icterus.  Neck: No JVD present. Carotid bruit is not present. No thyromegaly present.  Cardiovascular: Normal rate, regular rhythm and normal heart sounds.   No murmur heard. Pulmonary/Chest: Effort normal and breath sounds normal. No respiratory distress. She has no wheezes.  Abdominal: Soft. Bowel sounds are normal. She exhibits no distension.  Musculoskeletal: Normal range of motion. She exhibits no edema.  Neurological: She is alert. She exhibits normal muscle tone.  Reflex Scores:      Patellar reflexes are 2+ on the right side and 2+ on the left side. Skin: Skin is warm and dry. She is not diaphoretic. No  pallor.  Psychiatric: She has a normal mood and affect. Her behavior is normal. Judgment and thought content normal.   Results for orders placed or performed in visit on 09/29/15  Hemoglobin A1c  Result Value Ref Range   Hgb A1c MFr Bld 5.7 (H) <5.7 %   Mean Plasma Glucose 117 mg/dL  Comprehensive metabolic panel  Result Value Ref Range   Sodium 138 135 - 146 mmol/L   Potassium 3.6 3.5 - 5.3 mmol/L   Chloride 100 98 - 110 mmol/L   CO2 29 20 - 31 mmol/L   Glucose, Bld 117 (H) 65 - 99 mg/dL   BUN 23 7 - 25 mg/dL   Creat 0.91 0.50 - 1.05 mg/dL   Total Bilirubin 0.6 0.2 - 1.2 mg/dL   Alkaline Phosphatase 64 33 - 130 U/L   AST 22 10 - 35 U/L   ALT 22 6 - 29 U/L  Total Protein 7.1 6.1 - 8.1 g/dL   Albumin 4.3 3.6 - 5.1 g/dL   Calcium 9.7 8.6 - 10.4 mg/dL  CBC with Differential/Platelet  Result Value Ref Range   WBC 5.9 3.8 - 10.8 K/uL   RBC 4.82 3.80 - 5.10 MIL/uL   Hemoglobin 15.1 11.7 - 15.5 g/dL   HCT 45.2 (H) 35.0 - 45.0 %   MCV 93.8 80.0 - 100.0 fL   MCH 31.3 27.0 - 33.0 pg   MCHC 33.4 32.0 - 36.0 g/dL   RDW 13.6 11.0 - 15.0 %   Platelets 288 140 - 400 K/uL   MPV 9.3 7.5 - 12.5 fL   Neutro Abs 3,658 1,500 - 7,800 cells/uL   Lymphs Abs 1,475 850 - 3,900 cells/uL   Monocytes Absolute 590 200 - 950 cells/uL   Eosinophils Absolute 118 15 - 500 cells/uL   Basophils Absolute 59 0 - 200 cells/uL   Neutrophils Relative % 62 %   Lymphocytes Relative 25 %   Monocytes Relative 10 %   Eosinophils Relative 2 %   Basophils Relative 1 %   Smear Review Criteria for review not met   Lipid panel  Result Value Ref Range   Cholesterol 221 (H) 125 - 200 mg/dL   Triglycerides 164 (H) <150 mg/dL   HDL 64 >=46 mg/dL   Total CHOL/HDL Ratio 3.5 <=5.0 Ratio   VLDL 33 (H) <30 mg/dL   LDL Cholesterol 124 <130 mg/dL      Assessment & Plan:   Problem List Items Addressed This Visit      Cardiovascular and Mediastinum   Essential hypertension, benign (Chronic)    Controlled for  surgery        Respiratory   COPD (chronic obstructive pulmonary disease) ([place]) (Chronic)    See pulmonologist, Dr. Raul Del, for clearance prior to surgery        Endocrine   Impaired fasting glucose (Chronic)    Should not impact healing post-operatively        Other   Right hand pain    Try paraffin wax treatment; reviewed xray report with her; explained no erosive arthritis; if bracing not helping, then stop      Pre-operative clearance - Primary    Seen today prior to hystectomy; patient will need both cardiac clearance and pulmonary clearance prior to surgery; I can provide medical clearance otherwise; it will be okay to stop her aspirin 7 days prior to surgery, then resume day after surgery if okay with surgeon      Abnormal EKG    Compared to previous, unchanged, sinus tach with PACs, RBBB, LAFB; refer to cardiologist for pre-operative cardiac clearance      Relevant Orders   Ambulatory referral to Cardiology    Other Visit Diagnoses   None.     Follow up plan: Return keep Jan / Feb appt.  An after-visit summary was printed and given to the patient at [facility].  Please see the patient instructions which may contain other information and recommendations beyond what is mentioned above in the assessment and plan.  Orders Placed This Encounter  Procedures  . Ambulatory referral to Cardiology   MD note: patient was offered flu shot today; she declined

## 2015-11-25 ENCOUNTER — Telehealth: Payer: Self-pay | Admitting: Family Medicine

## 2015-11-25 NOTE — Telephone Encounter (Signed)
Andrea Bell from [facility] [place] states patient was seen by Dr Sanda Klein on yesterday 11/24/15 for surgical clearane and they are needing the clearance form faxed back to [phone]

## 2015-11-25 NOTE — Telephone Encounter (Signed)
Faxed to [facility]

## 2015-11-29 ENCOUNTER — Telehealth: Payer: Self-pay | Admitting: Family Medicine

## 2015-11-29 NOTE — Telephone Encounter (Signed)
PT IS SEEING DR CALLWOOD TOMORROW OCT 11-17 AT 3:30. PT HAS [employer] AND MEDICAID [place] ACESS. THE REFERRAL SAID PRE OP CLEARENCE ( Z01.818) PER [facility]ADA. DIAG CODE: R94.31. SPOKE WITH DONNA O AT OFFICE

## 2015-11-30 DIAGNOSIS — R011 Cardiac murmur, unspecified: Secondary | ICD-10-CM | POA: Diagnosis not present

## 2015-11-30 DIAGNOSIS — G4733 Obstructive sleep apnea (adult) (pediatric): Secondary | ICD-10-CM | POA: Diagnosis not present

## 2015-11-30 DIAGNOSIS — Z87891 Personal history of nicotine dependence: Secondary | ICD-10-CM | POA: Diagnosis not present

## 2015-11-30 DIAGNOSIS — R0602 Shortness of breath: Secondary | ICD-10-CM | POA: Diagnosis not present

## 2015-11-30 DIAGNOSIS — I10 Essential (primary) hypertension: Secondary | ICD-10-CM | POA: Diagnosis not present

## 2015-11-30 DIAGNOSIS — Z01810 Encounter for preprocedural cardiovascular examination: Secondary | ICD-10-CM | POA: Diagnosis not present

## 2015-11-30 DIAGNOSIS — J449 Chronic obstructive pulmonary disease, unspecified: Secondary | ICD-10-CM | POA: Diagnosis not present

## 2015-11-30 DIAGNOSIS — R9431 Abnormal electrocardiogram [ECG] [EKG]: Secondary | ICD-10-CM | POA: Diagnosis not present

## 2015-11-30 NOTE — Telephone Encounter (Signed)
Done and gave [place] the authorization number.

## 2015-12-06 ENCOUNTER — Encounter
Admission: RE | Admit: 2015-12-06 | Discharge: 2015-12-06 | Disposition: A | Discharge status: Home / Self Care | Payer: Commercial Managed Care - HMO | Source: Ambulatory Visit | Attending: Obstetrics and Gynecology | Admitting: Obstetrics and Gynecology

## 2015-12-06 DIAGNOSIS — N950 Postmenopausal bleeding: Secondary | ICD-10-CM | POA: Diagnosis not present

## 2015-12-06 DIAGNOSIS — D259 Leiomyoma of uterus, unspecified: Secondary | ICD-10-CM | POA: Diagnosis not present

## 2015-12-06 DIAGNOSIS — R0602 Shortness of breath: Secondary | ICD-10-CM | POA: Diagnosis not present

## 2015-12-06 DIAGNOSIS — Z01818 Encounter for other preprocedural examination: Secondary | ICD-10-CM | POA: Insufficient documentation

## 2015-12-06 DIAGNOSIS — Z01810 Encounter for preprocedural cardiovascular examination: Secondary | ICD-10-CM | POA: Diagnosis not present

## 2015-12-06 HISTORY — DX: Dyspnea, unspecified: R06.00

## 2015-12-06 LAB — COMPREHENSIVE METABOLIC PANEL
ALBUMIN: 4.2 g/dL (ref 3.5–5.0)
ALT: 24 U/L (ref 14–54)
AST: 28 U/L (ref 15–41)
Alkaline Phosphatase: 64 U/L (ref 38–126)
Anion gap: 9 (ref 5–15)
BUN: 22 mg/dL — AB (ref 6–20)
CHLORIDE: 104 mmol/L (ref 101–111)
CO2: 30 mmol/L (ref 22–32)
Calcium: 10 mg/dL (ref 8.9–10.3)
Creatinine, Ser: 0.88 mg/dL (ref 0.44–1.00)
GFR calc Af Amer: >60 mL/min (ref >60)
GFR calc non Af Amer: >60 mL/min (ref >60)
GLUCOSE: 116 mg/dL — AB (ref 65–99)
POTASSIUM: 3.8 mmol/L (ref 3.5–5.1)
SODIUM: 143 mmol/L (ref 135–145)
Total Bilirubin: 0.5 mg/dL (ref 0.3–1.2)
Total Protein: 7.6 g/dL (ref 6.5–8.1)

## 2015-12-06 LAB — CBC
HEMATOCRIT: 44.4 % (ref 35.0–47.0)
Hemoglobin: 15.5 g/dL (ref 12.0–16.0)
MCH: 32.5 pg (ref 26.0–34.0)
MCHC: 34.8 g/dL (ref 32.0–36.0)
MCV: 93.4 fL (ref 80.0–100.0)
PLATELETS: 254 10*3/uL (ref 150–440)
RBC: 4.75 MIL/uL (ref 3.80–5.20)
RDW: 13.7 % (ref 11.5–14.5)
WBC: 7.3 10*3/uL (ref 3.6–11.0)

## 2015-12-06 NOTE — Patient Instructions (Signed)
Your procedure is scheduled on: Tuesday 12/13/15 Report to Day Surgery. 2nd floor medical mall entrance To find out your arrival time please call [phone] between 1PM - 3PM on Monday 12/12/15.  Remember: Instructions that are not followed completely may result in serious medical risk, up to and including death, or upon the discretion of your surgeon and anesthesiologist your surgery may need to be rescheduled.    __X__ 1. Do not eat food or drink liquids after midnight. No gum chewing or hard candies.     __X__ 2. No Alcohol/no smoking for 24 hours before or after surgery.   __x__ 3. Bring all medications with you on the day of surgery if instructed. [place]   __X__ 4. Notify your doctor if there is any change in your medical condition     (cold, fever, infections).     Do not wear jewelry, make-up, hairpins, clips or nail polish.  Do not wear lotions, powders, or perfumes.   Do not shave 48 hours prior to surgery. Men may shave face and neck.  Do not bring valuables to the hospital.    [facility] is not responsible for any belongings or valuables.               Contacts, dentures or bridgework may not be worn into surgery.  Leave your suitcase in the car. After surgery it may be brought to your room.  For patients admitted to the hospital, discharge time is determined by your                treatment team.   Patients discharged the day of surgery will not be allowed to drive home.   Please read over the following fact sheets that you were given:   Surgical Site Infection Prevention   __X__ Take these medicines the morning of surgery with A SIP OF WATER:    1. METOPROLOL  2. BRING YOUR INHALERS/ USE YOUR NEBULIZER  3.   4.  5.  6.  ____ Fleet Enema (as directed)   __X__ Use CHG Soap as directed  __X__ Use inhalers on the day of surgery [place]  ____ Stop metformin 2 days prior to surgery    ____ Take 1/2 of usual  insulin dose the night before surgery and none on the morning of surgery.   __X__ Stop Coumadin/Plavix/aspirin on ASK CARDIOLOGIST ABOUT ABILITY TO STOP ASPIRIN  ____ Stop Anti-inflammatories on    ____ Stop supplements until after surgery.    ____ Bring C-Pap to the hospital.

## 2015-12-07 DIAGNOSIS — R0602 Shortness of breath: Secondary | ICD-10-CM | POA: Diagnosis not present

## 2015-12-07 DIAGNOSIS — Z01818 Encounter for other preprocedural examination: Secondary | ICD-10-CM | POA: Diagnosis not present

## 2015-12-07 DIAGNOSIS — J439 Emphysema, unspecified: Secondary | ICD-10-CM | POA: Diagnosis not present

## 2015-12-07 NOTE — Pre-Procedure Instructions (Signed)
Spoke to [employer] at office. Pt is scheduled to see cardiology and pulmonology for clearances prior to surgery.

## 2015-12-08 DIAGNOSIS — Z01810 Encounter for preprocedural cardiovascular examination: Secondary | ICD-10-CM | POA: Diagnosis not present

## 2015-12-08 DIAGNOSIS — R011 Cardiac murmur, unspecified: Secondary | ICD-10-CM | POA: Diagnosis not present

## 2015-12-08 DIAGNOSIS — R0602 Shortness of breath: Secondary | ICD-10-CM | POA: Diagnosis not present

## 2015-12-12 NOTE — Pre-Procedure Instructions (Signed)
Cleared by dr Raul Del 18/17.May require prolonged intubation. Negative stress/echo 12/08/15 by dr Clayborn Bigness

## 2015-12-12 NOTE — Pre-Procedure Instructions (Signed)
CLEARED LOW RISK BY DR CALLWOOD 12/12/15

## 2015-12-13 ENCOUNTER — Ambulatory Visit: Payer: Commercial Managed Care - HMO | Admitting: Anesthesiology

## 2015-12-13 ENCOUNTER — Encounter
Admission: RE | Disposition: A | Discharge status: Home / Self Care | Payer: Self-pay | Source: Ambulatory Visit | Attending: Obstetrics and Gynecology

## 2015-12-13 ENCOUNTER — Observation Stay
Admission: RE | Admit: 2015-12-13 | Discharge: 2015-12-14 | Disposition: A | Discharge status: Home / Self Care | Payer: Commercial Managed Care - HMO | Source: Ambulatory Visit | Attending: Obstetrics and Gynecology | Admitting: Obstetrics and Gynecology

## 2015-12-13 ENCOUNTER — Encounter: Payer: Self-pay | Admitting: *Deleted

## 2015-12-13 DIAGNOSIS — I10 Essential (primary) hypertension: Secondary | ICD-10-CM | POA: Insufficient documentation

## 2015-12-13 DIAGNOSIS — N950 Postmenopausal bleeding: Secondary | ICD-10-CM | POA: Diagnosis not present

## 2015-12-13 DIAGNOSIS — N802 Endometriosis of fallopian tube: Secondary | ICD-10-CM | POA: Insufficient documentation

## 2015-12-13 DIAGNOSIS — N83312 Acquired atrophy of left ovary: Secondary | ICD-10-CM | POA: Insufficient documentation

## 2015-12-13 DIAGNOSIS — D649 Anemia, unspecified: Secondary | ICD-10-CM | POA: Diagnosis not present

## 2015-12-13 DIAGNOSIS — N803 Endometriosis of pelvic peritoneum: Secondary | ICD-10-CM | POA: Diagnosis not present

## 2015-12-13 DIAGNOSIS — N83311 Acquired atrophy of right ovary: Secondary | ICD-10-CM | POA: Diagnosis not present

## 2015-12-13 DIAGNOSIS — J449 Chronic obstructive pulmonary disease, unspecified: Secondary | ICD-10-CM | POA: Diagnosis not present

## 2015-12-13 DIAGNOSIS — M1990 Unspecified osteoarthritis, unspecified site: Secondary | ICD-10-CM | POA: Insufficient documentation

## 2015-12-13 DIAGNOSIS — Z87891 Personal history of nicotine dependence: Secondary | ICD-10-CM | POA: Diagnosis not present

## 2015-12-13 DIAGNOSIS — Z90710 Acquired absence of both cervix and uterus: Secondary | ICD-10-CM | POA: Diagnosis present

## 2015-12-13 DIAGNOSIS — N736 Female pelvic peritoneal adhesions (postinfective): Secondary | ICD-10-CM | POA: Insufficient documentation

## 2015-12-13 DIAGNOSIS — D251 Intramural leiomyoma of uterus: Secondary | ICD-10-CM | POA: Diagnosis not present

## 2015-12-13 DIAGNOSIS — D250 Submucous leiomyoma of uterus: Secondary | ICD-10-CM | POA: Diagnosis not present

## 2015-12-13 DIAGNOSIS — K219 Gastro-esophageal reflux disease without esophagitis: Secondary | ICD-10-CM | POA: Diagnosis not present

## 2015-12-13 DIAGNOSIS — N838 Other noninflammatory disorders of ovary, fallopian tube and broad ligament: Secondary | ICD-10-CM | POA: Diagnosis not present

## 2015-12-13 DIAGNOSIS — Z7982 Long term (current) use of aspirin: Secondary | ICD-10-CM | POA: Insufficient documentation

## 2015-12-13 HISTORY — PX: LAPAROSCOPIC HYSTERECTOMY: SHX1926

## 2015-12-13 HISTORY — PX: CYSTOSCOPY: SHX5120

## 2015-12-13 HISTORY — PX: LAPAROSCOPIC BILATERAL SALPINGO OOPHERECTOMY: SHX5890

## 2015-12-13 LAB — CBC
HCT: 42.3 % (ref 35.0–47.0)
HEMOGLOBIN: 14.5 g/dL (ref 12.0–16.0)
MCH: 32.5 pg (ref 26.0–34.0)
MCHC: 34.3 g/dL (ref 32.0–36.0)
MCV: 94.5 fL (ref 80.0–100.0)
Platelets: 251 10*3/uL (ref 150–440)
RBC: 4.48 MIL/uL (ref 3.80–5.20)
RDW: 13.5 % (ref 11.5–14.5)
WBC: 9 10*3/uL (ref 3.6–11.0)

## 2015-12-13 LAB — BASIC METABOLIC PANEL
ANION GAP: 10 (ref 5–15)
BUN: 22 mg/dL — ABNORMAL HIGH (ref 6–20)
CALCIUM: 8.8 mg/dL — AB (ref 8.9–10.3)
CHLORIDE: 101 mmol/L (ref 101–111)
CO2: 24 mmol/L (ref 22–32)
CREATININE: 0.97 mg/dL (ref 0.44–1.00)
GFR calc non Af Amer: >60 mL/min (ref >60)
Glucose, Bld: 161 mg/dL — ABNORMAL HIGH (ref 65–99)
Potassium: 4.2 mmol/L (ref 3.5–5.1)
SODIUM: 135 mmol/L (ref 135–145)

## 2015-12-13 LAB — TYPE AND SCREEN
ABO/RH(D): O POS
Antibody Screen: NEGATIVE

## 2015-12-13 SURGERY — HYSTERECTOMY, TOTAL, LAPAROSCOPIC
Anesthesia: General

## 2015-12-13 MED ORDER — ONDANSETRON HCL 4 MG/2ML IJ SOLN: 4.0000 mg | Freq: Once | INTRAMUSCULAR | Status: DC | PRN

## 2015-12-13 MED ORDER — BUPIVACAINE HCL 0.5 % IJ SOLN
INTRAMUSCULAR | Status: DC | PRN
  Administered 2015-12-13: 14 mL

## 2015-12-13 MED ORDER — FAMOTIDINE 20 MG PO TABS
20.0000 mg | ORAL_TABLET | Freq: Once | ORAL | Status: AC
  Administered 2015-12-13: 20 mg via ORAL

## 2015-12-13 MED ORDER — METOPROLOL SUCCINATE 12.5 MG HALF TABLET
12.5000 mg | ORAL_TABLET | Freq: Every day | ORAL | Status: DC
  Administered 2015-12-14: 12.5 mg via ORAL
  Filled 2015-12-13: qty 1

## 2015-12-13 MED ORDER — ACETAMINOPHEN 10 MG/ML IV SOLN
INTRAVENOUS | Status: AC
  Filled 2015-12-13: qty 100

## 2015-12-13 MED ORDER — KETOROLAC TROMETHAMINE 30 MG/ML IJ SOLN
INTRAMUSCULAR | Status: DC | PRN
  Administered 2015-12-13: 30 mg via INTRAVENOUS

## 2015-12-13 MED ORDER — LACTATED RINGERS IV SOLN
INTRAVENOUS | Status: DC
  Administered 2015-12-13 (×3): via INTRAVENOUS

## 2015-12-13 MED ORDER — SUGAMMADEX SODIUM 200 MG/2ML IV SOLN
INTRAVENOUS | Status: DC | PRN
  Administered 2015-12-13: 200 mg via INTRAVENOUS

## 2015-12-13 MED ORDER — DIPHENHYDRAMINE HCL 50 MG/ML IJ SOLN: 12.5000 mg | Freq: Four times a day (QID) | INTRAMUSCULAR | Status: DC | PRN

## 2015-12-13 MED ORDER — DEXAMETHASONE SODIUM PHOSPHATE 10 MG/ML IJ SOLN
INTRAMUSCULAR | Status: DC | PRN
  Administered 2015-12-13: 5 mg via INTRAVENOUS

## 2015-12-13 MED ORDER — BUPIVACAINE HCL (PF) 0.5 % IJ SOLN
INTRAMUSCULAR | Status: AC
  Filled 2015-12-13: qty 30

## 2015-12-13 MED ORDER — ONDANSETRON HCL 4 MG/2ML IJ SOLN
4.0000 mg | Freq: Four times a day (QID) | INTRAMUSCULAR | Status: DC | PRN
  Administered 2015-12-13: 4 mg via INTRAVENOUS
  Filled 2015-12-13: qty 2

## 2015-12-13 MED ORDER — NALOXONE HCL 0.4 MG/ML IJ SOLN: 0.4000 mg | INTRAMUSCULAR | Status: DC | PRN

## 2015-12-13 MED ORDER — ACETAMINOPHEN 10 MG/ML IV SOLN
INTRAVENOUS | Status: DC | PRN
  Administered 2015-12-13: 1000 mg via INTRAVENOUS

## 2015-12-13 MED ORDER — MORPHINE SULFATE (PF) 2 MG/ML IV SOLN
2.0000 mg | INTRAVENOUS | Status: DC | PRN
  Administered 2015-12-13 – 2015-12-14 (×3): 2 mg via INTRAVENOUS
  Filled 2015-12-13 (×3): qty 1

## 2015-12-13 MED ORDER — KETOROLAC TROMETHAMINE 30 MG/ML IJ SOLN
30.0000 mg | Freq: Four times a day (QID) | INTRAMUSCULAR | Status: DC
  Administered 2015-12-13 – 2015-12-14 (×3): 30 mg via INTRAVENOUS
  Filled 2015-12-13 (×3): qty 1

## 2015-12-13 MED ORDER — FAMOTIDINE 20 MG PO TABS
ORAL_TABLET | ORAL | Status: AC
  Filled 2015-12-13: qty 1

## 2015-12-13 MED ORDER — SODIUM CHLORIDE 0.45 % IV SOLN
INTRAVENOUS | Status: DC
  Administered 2015-12-13 – 2015-12-14 (×2): via INTRAVENOUS

## 2015-12-13 MED ORDER — DIPHENHYDRAMINE HCL 12.5 MG/5ML PO ELIX
12.5000 mg | ORAL_SOLUTION | Freq: Four times a day (QID) | ORAL | Status: DC | PRN
  Filled 2015-12-13: qty 5

## 2015-12-13 MED ORDER — OXYCODONE-ACETAMINOPHEN 5-325 MG PO TABS
1.0000 | ORAL_TABLET | ORAL | Status: DC | PRN
  Administered 2015-12-14 (×2): 2 via ORAL
  Administered 2015-12-14: 1 via ORAL
  Filled 2015-12-13: qty 2
  Filled 2015-12-13: qty 1
  Filled 2015-12-13: qty 2

## 2015-12-13 MED ORDER — EPHEDRINE SULFATE 50 MG/ML IJ SOLN
INTRAMUSCULAR | Status: DC | PRN
  Administered 2015-12-13 (×2): 10 mg via INTRAVENOUS

## 2015-12-13 MED ORDER — ALBUTEROL SULFATE (2.5 MG/3ML) 0.083% IN NEBU
2.5000 mg | INHALATION_SOLUTION | Freq: Four times a day (QID) | RESPIRATORY_TRACT | Status: DC | PRN
  Filled 2015-12-13: qty 3

## 2015-12-13 MED ORDER — FENTANYL CITRATE (PF) 100 MCG/2ML IJ SOLN
INTRAMUSCULAR | Status: AC
  Filled 2015-12-13: qty 2

## 2015-12-13 MED ORDER — ONDANSETRON HCL 4 MG PO TABS: 4.0000 mg | ORAL_TABLET | Freq: Four times a day (QID) | ORAL | Status: DC | PRN

## 2015-12-13 MED ORDER — KETOROLAC TROMETHAMINE 30 MG/ML IJ SOLN: 30.0000 mg | Freq: Four times a day (QID) | INTRAMUSCULAR | Status: DC

## 2015-12-13 MED ORDER — MIDAZOLAM HCL 2 MG/2ML IJ SOLN
INTRAMUSCULAR | Status: DC | PRN
  Administered 2015-12-13 (×2): 1 mg via INTRAVENOUS

## 2015-12-13 MED ORDER — BUDESONIDE 0.5 MG/2ML IN SUSP
0.5000 mg | Freq: Two times a day (BID) | RESPIRATORY_TRACT | Status: DC
  Administered 2015-12-13 – 2015-12-14 (×2): 0.5 mg via RESPIRATORY_TRACT
  Filled 2015-12-13 (×3): qty 2

## 2015-12-13 MED ORDER — FENTANYL CITRATE (PF) 100 MCG/2ML IJ SOLN
INTRAMUSCULAR | Status: DC | PRN
  Administered 2015-12-13 (×2): 50 ug via INTRAVENOUS

## 2015-12-13 MED ORDER — HYDROMORPHONE 1 MG/ML IV SOLN
INTRAVENOUS | Status: DC
  Administered 2015-12-13: 14:00:00 via INTRAVENOUS
  Filled 2015-12-13: qty 25

## 2015-12-13 MED ORDER — ROCURONIUM BROMIDE 100 MG/10ML IV SOLN
INTRAVENOUS | Status: DC | PRN
  Administered 2015-12-13: 5 mg via INTRAVENOUS
  Administered 2015-12-13: 10 mg via INTRAVENOUS
  Administered 2015-12-13: 35 mg via INTRAVENOUS
  Administered 2015-12-13: 10 mg via INTRAVENOUS
  Administered 2015-12-13: 5 mg via INTRAVENOUS

## 2015-12-13 MED ORDER — SODIUM CHLORIDE 0.9% FLUSH: 9.0000 mL | INTRAVENOUS | Status: DC | PRN

## 2015-12-13 MED ORDER — ALBUTEROL SULFATE HFA 108 (90 BASE) MCG/ACT IN AERS: 2.0000 | INHALATION_SPRAY | Freq: Four times a day (QID) | RESPIRATORY_TRACT | Status: DC | PRN

## 2015-12-13 MED ORDER — PROPOFOL 10 MG/ML IV BOLUS
INTRAVENOUS | Status: DC | PRN
  Administered 2015-12-13: 150 mg via INTRAVENOUS

## 2015-12-13 MED ORDER — SIMETHICONE 80 MG PO CHEW: 80.0000 mg | CHEWABLE_TABLET | Freq: Four times a day (QID) | ORAL | Status: DC | PRN

## 2015-12-13 MED ORDER — PROMETHAZINE HCL 25 MG/ML IJ SOLN
12.5000 mg | Freq: Four times a day (QID) | INTRAMUSCULAR | Status: DC | PRN
  Administered 2015-12-13: 12.5 mg via INTRAVENOUS
  Filled 2015-12-13: qty 1

## 2015-12-13 MED ORDER — ONDANSETRON HCL 4 MG/2ML IJ SOLN
INTRAMUSCULAR | Status: DC | PRN
  Administered 2015-12-13: 4 mg via INTRAVENOUS

## 2015-12-13 MED ORDER — FENTANYL CITRATE (PF) 100 MCG/2ML IJ SOLN
25.0000 ug | INTRAMUSCULAR | Status: AC | PRN
  Administered 2015-12-13 (×6): 25 ug via INTRAVENOUS

## 2015-12-13 MED ORDER — HYDROMORPHONE HCL 1 MG/ML IJ SOLN
INTRAMUSCULAR | Status: DC | PRN
  Administered 2015-12-13 (×2): .4 mg via INTRAVENOUS

## 2015-12-13 MED ORDER — FENTANYL CITRATE (PF) 100 MCG/2ML IJ SOLN
INTRAMUSCULAR | Status: AC
  Administered 2015-12-13: 25 ug via INTRAVENOUS
  Filled 2015-12-13: qty 2

## 2015-12-13 MED ORDER — LIDOCAINE HCL (CARDIAC) 20 MG/ML IV SOLN
INTRAVENOUS | Status: DC | PRN
  Administered 2015-12-13: 100 mg via INTRAVENOUS

## 2015-12-13 MED ORDER — CEFAZOLIN SODIUM-DEXTROSE 2-4 GM/100ML-% IV SOLN
INTRAVENOUS | Status: AC
  Filled 2015-12-13: qty 100

## 2015-12-13 MED ORDER — IPRATROPIUM-ALBUTEROL 0.5-2.5 (3) MG/3ML IN SOLN: 3.0000 mL | Freq: Four times a day (QID) | RESPIRATORY_TRACT | Status: DC | PRN

## 2015-12-13 MED ORDER — CEFAZOLIN SODIUM-DEXTROSE 2-4 GM/100ML-% IV SOLN
2.0000 g | Freq: Once | INTRAVENOUS | Status: AC
  Administered 2015-12-13: 2 g via INTRAVENOUS

## 2015-12-13 SURGICAL SUPPLY — 49 items
APPLICATOR ARISTA FLEXITIP XL (MISCELLANEOUS) ×4 IMPLANT
BAG URO DRAIN 2000ML W/SPOUT (MISCELLANEOUS) ×4 IMPLANT
BLADE SURG SZ11 CARB STEEL (BLADE) ×4 IMPLANT
CANISTER SUCT 1200ML W/VALVE (MISCELLANEOUS) ×4 IMPLANT
CATH FOLEY 2WAY  5CC 16FR (CATHETERS) ×2
CATH ROBINSON RED A/P 16FR (CATHETERS) ×4 IMPLANT
CATH URTH 16FR FL 2W BLN LF (CATHETERS) ×2 IMPLANT
CHLORAPREP W/TINT 26ML (MISCELLANEOUS) ×4 IMPLANT
DRAPE UNDER BUTTOCK W/FLU (DRAPES) ×4 IMPLANT
GLOVE BIO SURGEON STRL SZ7 (GLOVE) ×28 IMPLANT
GLOVE INDICATOR 7.5 STRL GRN (GLOVE) ×16 IMPLANT
GOWN STRL REUS W/ TWL LRG LVL3 (GOWN DISPOSABLE) ×4 IMPLANT
GOWN STRL REUS W/ TWL XL LVL3 (GOWN DISPOSABLE) ×2 IMPLANT
GOWN STRL REUS W/TWL LRG LVL3 (GOWN DISPOSABLE) ×4
GOWN STRL REUS W/TWL XL LVL3 (GOWN DISPOSABLE) ×2
HEMOSTAT ARISTA ABSORB 1G (MISCELLANEOUS) ×4 IMPLANT
IRRIGATION STRYKERFLOW (MISCELLANEOUS) ×2 IMPLANT
IRRIGATOR STRYKERFLOW (MISCELLANEOUS) ×4
IV LACTATED RINGERS 1000ML (IV SOLUTION) ×4 IMPLANT
IV NS 1000ML (IV SOLUTION) ×2
IV NS 1000ML BAXH (IV SOLUTION) ×2 IMPLANT
KIT PINK PAD W/HEAD ARE REST (MISCELLANEOUS) ×4
KIT PINK PAD W/HEAD ARM REST (MISCELLANEOUS) ×2 IMPLANT
LABEL OR SOLS (LABEL) ×4 IMPLANT
LIQUID BAND (GAUZE/BANDAGES/DRESSINGS) ×4 IMPLANT
MANIPULATOR VCARE LG CRV RETR (MISCELLANEOUS) IMPLANT
MANIPULATOR VCARE SML CRV RETR (MISCELLANEOUS) IMPLANT
MANIPULATOR VCARE STD CRV RETR (MISCELLANEOUS) IMPLANT
NS IRRIG 1000ML POUR BTL (IV SOLUTION) ×4 IMPLANT
NS IRRIG 500ML POUR BTL (IV SOLUTION) ×4 IMPLANT
OCCLUDER COLPOPNEUMO (BALLOONS) IMPLANT
PACK GYN LAPAROSCOPIC (MISCELLANEOUS) ×4 IMPLANT
PAD OB MATERNITY 4.3X12.25 (PERSONAL CARE ITEMS) ×4 IMPLANT
PAD PREP 24X41 OB/GYN DISP (PERSONAL CARE ITEMS) ×4 IMPLANT
SET CYSTO W/LG BORE CLAMP LF (SET/KITS/TRAYS/PACK) ×4 IMPLANT
SHEARS HARMONIC ACE PLUS 36CM (ENDOMECHANICALS) ×4 IMPLANT
SLEEVE ENDOPATH XCEL 5M (ENDOMECHANICALS) ×8 IMPLANT
SPONGE LAP 18X18 5 PK (GAUZE/BANDAGES/DRESSINGS) IMPLANT
SPONGE XRAY 4X4 16PLY STRL (MISCELLANEOUS) ×4 IMPLANT
STRAP SAFETY BODY (MISCELLANEOUS) ×4 IMPLANT
SURGILUBE 2OZ TUBE FLIPTOP (MISCELLANEOUS) ×4 IMPLANT
SUT VIC AB 0 CT1 27 (SUTURE) ×2
SUT VIC AB 0 CT1 27XCR 8 STRN (SUTURE) ×2 IMPLANT
SUT VIC AB 2-0 UR6 27 (SUTURE) ×4 IMPLANT
SUT VIC AB 4-0 FS2 27 (SUTURE) ×8 IMPLANT
SYR 50ML LL SCALE MARK (SYRINGE) ×4 IMPLANT
SYRINGE 10CC LL (SYRINGE) ×4 IMPLANT
TROCAR XCEL NON-BLD 5MMX100MML (ENDOMECHANICALS) ×4 IMPLANT
TUBING INSUFFLATOR HEATED (MISCELLANEOUS) ×4 IMPLANT

## 2015-12-13 NOTE — Progress Notes (Signed)
Obstetric and Gynecology  Subjective  Some nausea, still reporting pain not well covered by PCA and nursing staff reporting sedation with PCA.  Pain mostly near umbilical port site.  Some nausea still postoperatively, 1 episode of emesis.  No flatus.  No CP or SOB  Objective   Vitals:   12/13/15 1351 12/13/15 1544  BP:  (!) 145/79  Pulse:  79  Resp: 20 18  Temp:  98.9 F (37.2 C)     Intake/Output Summary (Last 24 hours) at 12/13/15 1701 Last data filed at 12/13/15 1100  Gross per 24 hour  Intake             2000 ml  Output              750 ml  Net             1250 ml    General: NAD Cardiovascular: RRR Abdomen: NABS, soft, mildly distended, tympanic to percussion,  Extremities: SCD's in place  Labs: Results for orders placed or performed during the hospital encounter of 12/13/15 (from the past 24 hour(s))  Type and screen [place]     Status: None   Collection Time: 12/13/15  6:31 AM  Result Value Ref Range   ABO/RH(D) O POS    Antibody Screen NEG    Sample Expiration 12/16/2015     Assessment   60 y.o. POD0 TLH, BSO, cystoscopy for PMB and fibroid uterus  Plan  - 1 dose of prn Zofran - Switch to po analgesics given sedation on dilaudid PCA - Benign exam, but will check BMP and CBC

## 2015-12-13 NOTE — Progress Notes (Signed)
Obstetric and Gynecology  Subjective  Patient with continue nausea, has had dry-heaving but no actual emesis.  Reports headache.  Her allergy to codeine is nausea, is unsure of what other narcotics she has tolerated in the past.  We discussed results of her recent lab work which was stable from preoperative values.  Does report headache.     Objective   Today's Vitals   12/13/15 1341 12/13/15 1351 12/13/15 1544 12/13/15 1917  BP: 121/65  (!) 145/79 123/66  Pulse: 73  79 61  Resp: 18 20 18 16   Temp: 97.9 F (36.6 C)  98.9 F (37.2 C) 97.4 F (36.3 C)  TempSrc:   Oral Oral  SpO2: 100% 100% 100% 100%  Weight:      Height:      PainSc:  10-Worst pain ever       Intake/Output Summary (Last 24 hours) at 12/13/15 2024 Last data filed at 12/13/15 1100  Gross per 24 hour  Intake             2000 ml  Output              750 ml  Net             1250 ml   About 489mL of clear urine in foley bag  General: NAD Abdomen: NABS, soft, non-distended, appropriately tender around incision sites, no rebound no guarding Extremities: SCD's in place  Labs: Results for orders placed or performed during the hospital encounter of 12/13/15 (from the past 24 hour(s))  Type and screen [place]     Status: None   Collection Time: 12/13/15  6:31 AM  Result Value Ref Range   ABO/RH(D) O POS    Antibody Screen NEG    Sample Expiration 12/16/2015   CBC     Status: None   Collection Time: 12/13/15  5:22 PM  Result Value Ref Range   WBC 9.0 3.6 - 11.0 K/uL   RBC 4.48 3.80 - 5.20 MIL/uL   Hemoglobin 14.5 12.0 - 16.0 g/dL   HCT 42.3 35.0 - 47.0 %   MCV 94.5 80.0 - 100.0 fL   MCH 32.5 26.0 - 34.0 pg   MCHC 34.3 32.0 - 36.0 g/dL   RDW 13.5 11.5 - 14.5 %   Platelets 251 150 - 440 K/uL  Basic metabolic panel     Status: Abnormal   Collection Time: 12/13/15  5:22 PM  Result Value Ref Range   Sodium 135 135 - 145 mmol/L   Potassium 4.2 3.5 - 5.1 mmol/L   Chloride 101 101 - 111  mmol/L   CO2 24 22 - 32 mmol/L   Glucose, Bld 161 (H) 65 - 99 mg/dL   BUN 22 (H) 6 - 20 mg/dL   Creatinine, Ser 0.97 0.44 - 1.00 mg/dL   Calcium 8.8 (L) 8.9 - 10.3 mg/dL   GFR calc non Af Amer >60 >60 mL/min   GFR calc Af Amer >60 >60 mL/min   Anion gap 10 5 - 15    Assessment   60 y.o. POD0 TLH, BSO, and cystoscopy  Plan   1) Postop nausea - will add phenergan for breakthrough nausea on top of IV zofran  2) Pain - was sedated on dilaudid PCA, plan to switch to po percocet once nausea under control.  Morphine pushes until then  3) Kidney function - stable from baseline values 0.88 and 0.91 at 0.97 with stable BUN.  Normal cystoscopy intraoperative with  bilateral efflux of urine but given large lower uterine segment fibroids ureteral injury while less likely remains on differential.  Will continue to monitor I&O closely, also given history of CHF, repeat BMP in AM  4) Heme - stable CBC, good UOP and stable vitals

## 2015-12-13 NOTE — Anesthesia Procedure Notes (Signed)
Procedure Name: Intubation Date/Time: 12/13/2015 7:51 AM Performed by: [facility] Pre-anesthesia Checklist: Patient identified, Emergency Drugs available, Suction available and Patient being monitored Patient Re-evaluated:Patient Re-evaluated prior to inductionOxygen Delivery Method: Circle system utilized Preoxygenation: Pre-oxygenation with 100% oxygen Intubation Type: IV induction Ventilation: Mask ventilation without difficulty Laryngoscope Size: Mac and 3 Grade View: Grade II Tube type: Oral Tube size: 7.0 mm Number of attempts: 1 Airway Equipment and Method: Patient positioned with wedge pillow and Stylet Placement Confirmation: ETT inserted through vocal cords under direct vision Dental Injury: Teeth and Oropharynx as per pre-operative assessment

## 2015-12-13 NOTE — Progress Notes (Signed)
Saw patient discussed surgical findings, plan of care.  Dilaudid PCA postop, if doing well will switch to po analgesic this evening.  Anticipate discharge POD1

## 2015-12-13 NOTE — Plan of Care (Signed)
Problem: Education: Goal: Knowledge of [place] [facility] Education information/materials will improve Outcome: Not Progressing Pt. Is sedated Post -Op. Michael/Husband instructed and will Instruct Pt. When she is more alert.  Problem: Health Behavior/Discharge Planning: Goal: Ability to manage health-related needs will improve Outcome: Not Progressing Unable to assess secondary to Pt. Sedation.  Problem: Activity: Goal: Risk for activity intolerance will decrease Outcome: Not Progressing Unable to assess secondary to Post-Op sedation.  Problem: Nutrition: Goal: Adequate nutrition will be maintained Outcome: Not Progressing Sedated  Problem: Education: Goal: Knowledge of the prescribed therapeutic regimen will improve Outcome: Not Progressing Post-Op sedation  Problem: Respiratory: Goal: Ability to maintain adequate ventilation will improve Outcome: Progressing 95-97% Saturation on 2L/[place]  Problem: Urinary Elimination: Goal: Ability to reestablish a normal urinary elimination pattern will improve Outcome: Not Progressing Has indwelling Foley catheter that is draining clear amber urine.

## 2015-12-13 NOTE — Op Note (Signed)
Preoperative Diagnosis: 1) 60 y.o. with postmenopausal bleeding 2) In office biopsy showing atrophic endometrium 3) Uterine fibroids  Postoperative Diagnosis: 1) 60 y.o. with postmenopausal bleeding 2) In office biopsy showing atrophic endometrium 3) Uterine fibroids  Operation Performed: Laparoscopic total laparoscopic hysterectomy, bilateral salpingo-oophorectomy, and cystoscopy  Indication: 60 y.o. with thickened endometrial lining and multiple uterine fibroids on work up for postmenopausal bleeding.  Her in office biopsy showed atrophic endometrium.  Given continued bleeding decision made to proceed with hysterectomy.  Surgeon: Malachy Mood, MD  Assistant: Barnett Applebaum, MD  Anesthesia: General  Preoperative Antibiotics: 2g Ancef  Estimated Blood Loss: 369mL  IV Fluids: 1615mL  Urine Output:: 462mL  Drains or Tubes: foley to gravity drainage  Implants: Arista applied to all pedicles  Specimens Removed: Uterus (vaginally morcellated), bilateral tubes, ovaries  Complications: none  Intraoperative Findings: Enlarged uterus with several fundal fibroids and two lower uterine segment fibroids, normal appearing ovaries and tubes, normal appearing cervix.  Cystoscopy at the conclusion of the case revealed intact bladder dome and bilateral efflux of urine from both ureters.    Patient Condition: stable  Procedure in Detail:  Patient was taken to the operating room where she was administered general anesthesia.  She was positioned in the dorsal lithotomy position utilizing Allen stirups, prepped and draped in the usual sterile fashion.  Prior to proceeding with procedure a time out was performed.  Attention was turned to the patient's pelvis.  An indwelling foley catheter was used to empty the patient's bladder.  An operative speculum was placed to allow visualization of the cervix.  The anterior lip of the cervix was grasped with a single tooth tenaculum, and a large V-care was  placed to allow manipulation of the uterus.  The operative speculum and single tooth tenaculum were then removed.  Attention was turned to the patient's abdomen.  The umbilicus was infiltrated with 0.5% Sensorcaine, before making a stab incision using an 11 blade scalpel.  A 68mm Excel trocar was then used to gain direct entry into the peritoneal cavity utilizing the camera to visualize progress of the trocar during placement.  Once peritoneal entry had been achieved, insufflation was started and pneumoperitoneum established at a pressure of 59mmHg.  A left and right lower quadrant site was then injected with 0.5% Sensorcaine and a stab incisions were made using an 11 blade scalpel.  Two additional 76mm excel trocars were placed through these incision under direct visualization. General inspection of the abdomen revealed the above noted findings.   The left cornua was grasped and the round ligament, fallopian tube, and utero-ovarian ligaments were dissected off the uterus using a 61mm harmonic scalpel.  The anterior leaf of the broad ligament was dissected down to the level of the internal cervical os and a bladder flap was started.  The posterior peritoneum was incised down to the level of the left utero-sacral ligament.  The uterine artery was skeletonized and lateralized off a left lateral lower uterine segment fibroid.  Attention was then turned to the patient's right adnexal structures.  The round ligament was incised and the broad leaf ligament dissected down to the level of the internal cervical os completing the previously started bladder flap.  The IP ligament was identified, ureter visualized below the plane of dissection before transecting the IP using a 46mm harmonic.  The uterine artery was skeletonized and transected.  Bladder was well off the V-care cup.  An anterior colpotomy was started and carried around cirmcumferential using the harmonic  scalpel.  The specimen had to be morcellated vaginally to  facilitate removal.  The left tube and ovary were then removed by pulling up on the ovary and dissecting the IP ligament staying close to the ovary with the harmonic scalpel.    The vaginal cuff was closed using interrupted figure of 8 sutures using 0 Vicryl via a vaginal approach.  Cuff was noted to be hemostatic on closure.  Cystoscopy was performed noting intact bladder dome and bilateral efflux of urine from bother ureters.  The foley was replaced following cystoscopy.  Pneumoperitoneum was re-established.  The pelvis was irrigated.  All pedicles were noted to be hemostatic.  Carleene Overlie was applied to all pedicles.  Pneumoperitoneum was evacuated.  All trocar sites were then dressed with surgical skin glue.   Sponge needle and instrument counts were correct time two.  The patient tolerated the procedure well and was taken to the recovery room in stable condition.

## 2015-12-13 NOTE — Transfer of Care (Signed)
Immediate Anesthesia Transfer of Care Note  Patient: Andrea Bell  Procedure(s) Performed: Procedure(s): HYSTERECTOMY TOTAL LAPAROSCOPIC (N/A) LAPAROSCOPIC BILATERAL SALPINGECTOMY (Bilateral) CYSTOSCOPY (N/A)  Patient Location: PACU  Anesthesia Type:General  Level of Consciousness: sedated  Airway & Oxygen Therapy: Patient Spontanous Breathing and Patient connected to nasal cannula oxygen  Post-op Assessment: Report given to RN and Post -op Vital signs reviewed and stable  Post vital signs: Reviewed and stable  Last Vitals:  Vitals:   12/13/15 0624  BP: (!) 145/89  Pulse: 75  Resp: 16  Temp: (!) 35.8 C    Last Pain:  Vitals:   12/13/15 0624  TempSrc: Tympanic         Complications: No apparent anesthesia complications

## 2015-12-13 NOTE — H&P (Signed)
Date of Initial paper H&P: 12/06/15  History reviewed, patient examined, no change in status, stable for surgery.

## 2015-12-13 NOTE — Progress Notes (Signed)
Nausea improved with addition of promethazine, resting comfortably

## 2015-12-13 NOTE — Anesthesia Preprocedure Evaluation (Signed)
Anesthesia Evaluation  Patient identified by MRN, date of birth, ID band Patient awake    Reviewed: Allergy & Precautions, NPO status , Patient's Chart, lab work & pertinent test results, reviewed documented beta blocker date and time   Airway Mallampati: III       Dental  (+) Chipped   Pulmonary shortness of breath and with exertion, COPD,  COPD inhaler, former smoker,    Pulmonary exam normal        Cardiovascular hypertension, Pt. on medications and Pt. on home beta blockers Normal cardiovascular exam     Neuro/Psych negative neurological ROS  negative psych ROS   GI/Hepatic Neg liver ROS, GERD  Medicated and Controlled,  Endo/Other  negative endocrine ROS  Renal/GU negative Renal ROS  negative genitourinary   Musculoskeletal  (+) Arthritis , Osteoarthritis,    Abdominal Normal abdominal exam  (+)   Peds negative pediatric ROS (+)  Hematology  (+) anemia ,   Anesthesia Other Findings   Reproductive/Obstetrics                             Anesthesia Physical Anesthesia Plan  ASA: III  Anesthesia Plan: General   Post-op Pain Management:    Induction: Intravenous  Airway Management Planned: Oral ETT  Additional Equipment:   Intra-op Plan:   Post-operative Plan: Extubation in OR  Informed Consent: I have reviewed the patients History and Physical, chart, labs and discussed the procedure including the risks, benefits and alternatives for the proposed anesthesia with the patient or authorized representative who has indicated his/her understanding and acceptance.   Dental advisory given  Plan Discussed with: CRNA and Surgeon  Anesthesia Plan Comments:         Anesthesia Quick Evaluation

## 2015-12-14 ENCOUNTER — Encounter: Payer: Self-pay | Admitting: Obstetrics and Gynecology

## 2015-12-14 DIAGNOSIS — N83311 Acquired atrophy of right ovary: Secondary | ICD-10-CM | POA: Diagnosis not present

## 2015-12-14 DIAGNOSIS — J449 Chronic obstructive pulmonary disease, unspecified: Secondary | ICD-10-CM | POA: Diagnosis not present

## 2015-12-14 DIAGNOSIS — N838 Other noninflammatory disorders of ovary, fallopian tube and broad ligament: Secondary | ICD-10-CM | POA: Diagnosis not present

## 2015-12-14 DIAGNOSIS — D251 Intramural leiomyoma of uterus: Secondary | ICD-10-CM | POA: Diagnosis not present

## 2015-12-14 DIAGNOSIS — N802 Endometriosis of fallopian tube: Secondary | ICD-10-CM | POA: Diagnosis not present

## 2015-12-14 DIAGNOSIS — D250 Submucous leiomyoma of uterus: Secondary | ICD-10-CM | POA: Diagnosis not present

## 2015-12-14 DIAGNOSIS — N83312 Acquired atrophy of left ovary: Secondary | ICD-10-CM | POA: Diagnosis not present

## 2015-12-14 DIAGNOSIS — N736 Female pelvic peritoneal adhesions (postinfective): Secondary | ICD-10-CM | POA: Diagnosis not present

## 2015-12-14 DIAGNOSIS — N950 Postmenopausal bleeding: Secondary | ICD-10-CM | POA: Diagnosis not present

## 2015-12-14 LAB — BASIC METABOLIC PANEL
Anion gap: 7 (ref 5–15)
BUN: 19 mg/dL (ref 6–20)
CHLORIDE: 102 mmol/L (ref 101–111)
CO2: 28 mmol/L (ref 22–32)
CREATININE: 0.79 mg/dL (ref 0.44–1.00)
Calcium: 8.8 mg/dL — ABNORMAL LOW (ref 8.9–10.3)
GFR calc Af Amer: >60 mL/min (ref >60)
GFR calc non Af Amer: >60 mL/min (ref >60)
Glucose, Bld: 107 mg/dL — ABNORMAL HIGH (ref 65–99)
POTASSIUM: 3.6 mmol/L (ref 3.5–5.1)
Sodium: 137 mmol/L (ref 135–145)

## 2015-12-14 LAB — CBC
HEMATOCRIT: 38.7 % (ref 35.0–47.0)
Hemoglobin: 13.1 g/dL (ref 12.0–16.0)
MCH: 32 pg (ref 26.0–34.0)
MCHC: 33.8 g/dL (ref 32.0–36.0)
MCV: 94.7 fL (ref 80.0–100.0)
PLATELETS: 243 10*3/uL (ref 150–440)
RBC: 4.09 MIL/uL (ref 3.80–5.20)
RDW: 13.9 % (ref 11.5–14.5)
WBC: 8.9 10*3/uL (ref 3.6–11.0)

## 2015-12-14 MED ORDER — IBUPROFEN 600 MG PO TABS
600.0000 mg | ORAL_TABLET | Freq: Four times a day (QID) | ORAL | Status: DC | PRN
  Administered 2015-12-14 (×2): 600 mg via ORAL
  Filled 2015-12-14 (×2): qty 1

## 2015-12-14 MED ORDER — OXYCODONE-ACETAMINOPHEN 5-325 MG PO TABS: 1.0000 | ORAL_TABLET | ORAL | 0 refills | Status: DC | PRN

## 2015-12-14 MED ORDER — KETOROLAC TROMETHAMINE 30 MG/ML IJ SOLN: 30.0000 mg | Freq: Four times a day (QID) | INTRAMUSCULAR | Status: DC

## 2015-12-14 MED ORDER — IBUPROFEN 600 MG PO TABS
600.0000 mg | ORAL_TABLET | Freq: Four times a day (QID) | ORAL | 0 refills | Status: DC | PRN
Start: 2015-12-14 — End: 2016-04-09

## 2015-12-14 NOTE — Progress Notes (Signed)
Patient weaned off of oxygen.  Attempted to get out of bed to walk to bathroom but she felt very dizzy upon standing up.  She tried to take one step, but her knees began to buckle.  Patient was able to sit back down in her bed.  Bedside commode brought to bedside.  Patient stood with assistance x2 and pivoted to [facility].   Unable to void at this time.  Continue to monitor.  Family at the bedside.

## 2015-12-14 NOTE — Progress Notes (Signed)
Patient able to ambulate approximately 15 feet before she had to return to bed.  She stated she didn't feel as though she could go any further.  She was feeling dizzy during ambulation.  She would like to retry ambulating in approx. 1hr time to decide if she feels comfortable to discharge this afternoon.

## 2015-12-14 NOTE — Progress Notes (Signed)
Patient is alert and oriented but slightly lethargic.  Attempting to wean 02.  No complaints of pain at this time. Family at bedside. Continue to monitor.

## 2015-12-14 NOTE — Progress Notes (Signed)
Patient able to void 23ml.  She is very weak and having some pain issues.  Will medicate, allow to eat lunch, and then reattempt to get out of bed to ambulate if she is feeling able to do so.

## 2015-12-14 NOTE — Progress Notes (Signed)
Patient understands all discharge instructions and the need to attend follow up appointments. Patient discharge via wheelchair with RN.

## 2015-12-14 NOTE — Discharge Summary (Signed)
Physician Discharge Summary  Patient ID: Andrea Bell MRN: [id number] DOB/AGE: 1955-02-28 60 y.o.  Admit date: 12/13/2015 Discharge date: 12/14/2015  Admission Diagnoses: 1) Postmenopausal bleeding 2) Fibroid uterus  Discharge Diagnoses:  Active Problems:   S/P laparoscopic hysterectomy   Discharged Condition: good  Hospital Course: Patient admitted for [place], BSO and cystoscopy for continuous postmenopausal bleeding, normal in office endometrial biopsy, Fibroids, submucosal and intramural on ultrasound.  Patient underwent uncomplicated procedure on 12/13/2015.  She did have a mild ileus initially and some nausea but this resolved overnight into POD1.  By the time of discharge patient was tolerating po, ambulating, voiding spontaneously, and remained hemodynamically stable and afebrile.  Consults: None  Significant Diagnostic Studies:  Results for orders placed or performed during the hospital encounter of 12/13/15 (from the past 24 hour(s))  CBC     Status: None   Collection Time: 12/13/15  5:22 PM  Result Value Ref Range   WBC 9.0 3.6 - 11.0 K/uL   RBC 4.48 3.80 - 5.20 MIL/uL   Hemoglobin 14.5 12.0 - 16.0 g/dL   HCT 42.3 35.0 - 47.0 %   MCV 94.5 80.0 - 100.0 fL   MCH 32.5 26.0 - 34.0 pg   MCHC 34.3 32.0 - 36.0 g/dL   RDW 13.5 11.5 - 14.5 %   Platelets 251 150 - 440 K/uL  Basic metabolic panel     Status: Abnormal   Collection Time: 12/13/15  5:22 PM  Result Value Ref Range   Sodium 135 135 - 145 mmol/L   Potassium 4.2 3.5 - 5.1 mmol/L   Chloride 101 101 - 111 mmol/L   CO2 24 22 - 32 mmol/L   Glucose, Bld 161 (H) 65 - 99 mg/dL   BUN 22 (H) 6 - 20 mg/dL   Creatinine, Ser 0.97 0.44 - 1.00 mg/dL   Calcium 8.8 (L) 8.9 - 10.3 mg/dL   GFR calc non Af Amer >60 >60 mL/min   GFR calc Af Amer >60 >60 mL/min   Anion gap 10 5 - 15  Basic metabolic panel     Status: Abnormal   Collection Time: 12/14/15  5:39 AM  Result Value Ref Range   Sodium 137 135 - 145 mmol/L   Potassium  3.6 3.5 - 5.1 mmol/L   Chloride 102 101 - 111 mmol/L   CO2 28 22 - 32 mmol/L   Glucose, Bld 107 (H) 65 - 99 mg/dL   BUN 19 6 - 20 mg/dL   Creatinine, Ser 0.79 0.44 - 1.00 mg/dL   Calcium 8.8 (L) 8.9 - 10.3 mg/dL   GFR calc non Af Amer >60 >60 mL/min   GFR calc Af Amer >60 >60 mL/min   Anion gap 7 5 - 15  CBC     Status: None   Collection Time: 12/14/15  5:39 AM  Result Value Ref Range   WBC 8.9 3.6 - 11.0 K/uL   RBC 4.09 3.80 - 5.20 MIL/uL   Hemoglobin 13.1 12.0 - 16.0 g/dL   HCT 38.7 35.0 - 47.0 %   MCV 94.7 80.0 - 100.0 fL   MCH 32.0 26.0 - 34.0 pg   MCHC 33.8 32.0 - 36.0 g/dL   RDW 13.9 11.5 - 14.5 %   Platelets 243 150 - 440 K/uL     Treatments: IV hydration and surgery: TLH, BSO, cystoscopy  Discharge Exam: Blood pressure 117/65, pulse 74, temperature 98.8 F (37.1 C), temperature source Oral, resp. rate 12, height 5\' 7"  (1.702 m), weight  182 lb (82.6 kg), last menstrual period 10/27/2015, SpO2 99 %. General appearance: alert, appears stated age and no distress Resp: clear to auscultation bilaterally Cardio: regular rate and rhythm, S1, S2 normal, no murmur, click, rub or gallop GI: NABS, soft, non-tender, minimally distended and tympanic, trocar dressings D/C/I Extremities: extremities normal, atraumatic, no cyanosis or edema  Disposition: 01-Home or Self Care  Discharge Instructions    Activity as tolerated    Complete by:  As directed    Call MD for:  difficulty breathing, headache or visual disturbances    Complete by:  As directed    Call MD for:  extreme fatigue    Complete by:  As directed    Call MD for:  hives    Complete by:  As directed    Call MD for:  persistant dizziness or light-headedness    Complete by:  As directed    Call MD for:  persistant nausea and vomiting    Complete by:  As directed    Call MD for:  redness, tenderness, or signs of infection (pain, swelling, redness, odor or green/yellow discharge around incision site)    Complete by:   As directed    Call MD for:  severe uncontrolled pain    Complete by:  As directed    Call MD for:  temperature >100.4    Complete by:  As directed    Diet - low sodium heart healthy    Complete by:  As directed    Driving restriction     Complete by:  As directed    Avoid driving for at least 1 weeks.   Lifting restrictions    Complete by:  As directed    Weight restriction of 10 lbs for 6 weeks.   Sexual acrtivity    Complete by:  As directed    No intercourse for 6 weeks       Medication List    STOP taking these medications   HYDROcodone-acetaminophen 5-325 MG tablet Commonly known as:  NORCO/VICODIN     TAKE these medications   albuterol 108 (90 Base) MCG/ACT inhaler Commonly known as:  PROVENTIL HFA;VENTOLIN HFA Inhale 2 puffs into the lungs every 6 (six) hours as needed for wheezing or shortness of breath.   aspirin EC 81 MG tablet Take 81 mg by mouth daily.   budesonide 180 MCG/ACT inhaler Commonly known as:  PULMICORT Inhale 2 puffs into the lungs 2 (two) times daily.   docusate sodium 100 MG capsule Commonly known as:  COLACE Take 100 mg by mouth 2 (two) times daily as needed for mild constipation.   ibuprofen 600 MG tablet Commonly known as:  ADVIL,MOTRIN Take 1 tablet (600 mg total) by mouth every 6 (six) hours as needed for fever or headache.   ipratropium-albuterol 0.5-2.5 (3) MG/3ML Soln Commonly known as:  DUONEB Inhale 3 mLs into the lungs 4 (four) times daily as needed. For wheezing.   lisinopril-hydrochlorothiazide 10-12.5 MG tablet Commonly known as:  PRINZIDE,ZESTORETIC Take 1 tablet by mouth daily.   metoprolol succinate 25 MG 24 hr tablet Commonly known as:  TOPROL-XL Take 0.5 tablets (12.5 mg total) by mouth daily.   Olopatadine HCl 0.2 % Soln Apply 1 drop to eye daily.   oxyCODONE-acetaminophen 5-325 MG tablet Commonly known as:  PERCOCET/ROXICET Take 1-2 tablets by mouth every 4 (four) hours as needed for moderate pain or  severe pain.   OXYGEN Inhale 2 L into the lungs as needed.  potassium chloride 10 MEQ tablet Commonly known as:  K-DUR Take 1 tablet (10 mEq total) by mouth daily.   senna-docusate 8.6-50 MG tablet Commonly known as:  Senokot-S Take 1 tablet by mouth 2 (two) times daily as needed for mild constipation or moderate constipation.   tiZANidine 4 MG tablet Commonly known as:  ZANAFLEX Take 1 tablet (4 mg total) by mouth every 8 (eight) hours as needed. For muscle spasms.   traZODone 100 MG tablet Commonly known as:  DESYREL Take 1 tablet (100 mg total) by mouth at bedtime as needed for sleep.   triamcinolone 55 MCG/ACT Aero nasal inhaler Commonly known as:  NASACORT Place 2 sprays into the nose daily.        SignedDorthula Nettles 12/14/2015, 7:53 AM

## 2015-12-14 NOTE — Anesthesia Postprocedure Evaluation (Signed)
Anesthesia Post Note  Patient: Andrea Bell  Procedure(s) Performed: Procedure(s) (LRB): HYSTERECTOMY TOTAL LAPAROSCOPIC (N/A) CYSTOSCOPY (N/A) LAPAROSCOPIC BILATERAL SALPINGO OOPHORECTOMY (Bilateral)  Patient location during evaluation: PACU Anesthesia Type: General Level of consciousness: awake and alert and oriented Pain management: pain level controlled Vital Signs Assessment: post-procedure vital signs reviewed and stable Respiratory status: spontaneous breathing Cardiovascular status: blood pressure returned to baseline Anesthetic complications: no    Last Vitals:  Vitals:   12/14/15 1116 12/14/15 1140  BP:  134/73  Pulse:  77  Resp: 13 16  Temp:      Last Pain:  Vitals:   12/14/15 1208  TempSrc:   PainSc: 7                  Goldie Tregoning

## 2015-12-14 NOTE — Care Management Obs Status (Signed)
[place] NOTIFICATION   Patient Details  Name: Andrea Bell MRN: [id number] Date of Birth: Jul 30, 1955   Medicare Observation Status Notification Given:  Yes    Beau Fanny, RN 12/14/2015, 8:56 AM

## 2015-12-15 LAB — SURGICAL PATHOLOGY

## 2015-12-28 ENCOUNTER — Ambulatory Visit: Payer: Medicare PPO | Admitting: Family Medicine

## 2015-12-29 ENCOUNTER — Other Ambulatory Visit: Payer: Self-pay | Admitting: Family Medicine

## 2015-12-29 NOTE — Telephone Encounter (Signed)
Last Cr and K+ normal Rx approved 

## 2016-02-10 ENCOUNTER — Other Ambulatory Visit: Payer: Self-pay | Admitting: Family Medicine

## 2016-02-10 NOTE — Telephone Encounter (Signed)
Last K+ 3.6 on current regimen; Rx approved

## 2016-03-01 DIAGNOSIS — J449 Chronic obstructive pulmonary disease, unspecified: Secondary | ICD-10-CM | POA: Diagnosis not present

## 2016-03-01 DIAGNOSIS — R0902 Hypoxemia: Secondary | ICD-10-CM | POA: Diagnosis not present

## 2016-03-01 DIAGNOSIS — R0609 Other forms of dyspnea: Secondary | ICD-10-CM | POA: Diagnosis not present

## 2016-03-01 DIAGNOSIS — G4733 Obstructive sleep apnea (adult) (pediatric): Secondary | ICD-10-CM | POA: Diagnosis not present

## 2016-04-02 ENCOUNTER — Ambulatory Visit: Payer: Medicare PPO | Admitting: Family Medicine

## 2016-04-09 ENCOUNTER — Ambulatory Visit (INDEPENDENT_AMBULATORY_CARE_PROVIDER_SITE_OTHER): Payer: Medicare HMO | Admitting: Family Medicine

## 2016-04-09 ENCOUNTER — Encounter: Payer: Self-pay | Admitting: Family Medicine

## 2016-04-09 VITALS — BP 114/78 | HR 91 | Temp 98.4°F | Resp 16 | Wt 178.2 lb

## 2016-04-09 DIAGNOSIS — E782 Mixed hyperlipidemia: Secondary | ICD-10-CM | POA: Diagnosis not present

## 2016-04-09 DIAGNOSIS — I10 Essential (primary) hypertension: Secondary | ICD-10-CM

## 2016-04-09 DIAGNOSIS — Z114 Encounter for screening for human immunodeficiency virus [HIV]: Secondary | ICD-10-CM

## 2016-04-09 DIAGNOSIS — Z1159 Encounter for screening for other viral diseases: Secondary | ICD-10-CM

## 2016-04-09 DIAGNOSIS — Z23 Encounter for immunization: Secondary | ICD-10-CM | POA: Diagnosis not present

## 2016-04-09 DIAGNOSIS — Z5181 Encounter for therapeutic drug level monitoring: Secondary | ICD-10-CM | POA: Diagnosis not present

## 2016-04-09 DIAGNOSIS — R7301 Impaired fasting glucose: Secondary | ICD-10-CM | POA: Diagnosis not present

## 2016-04-09 MED ORDER — CETIRIZINE HCL 10 MG PO TABS: 10.0000 mg | ORAL_TABLET | Freq: Every day | ORAL | 3 refills | Status: DC

## 2016-04-09 NOTE — Assessment & Plan Note (Addendum)
Excellent control today; try to follow DASH guidelines; explained risk of decongestants, reason to avoid; continue great job with weight loss efforts

## 2016-04-09 NOTE — Assessment & Plan Note (Signed)
Check labs 

## 2016-04-09 NOTE — Patient Instructions (Addendum)
You received the flu shot today; it should protect you against the flu virus over the coming months; it will take about two weeks for antibodies to develop; do try to stay away from hospitals, nursing homes, and daycares during peak flu season; taking extra vitamin C daily during flu season may help you avoid getting sick  Your goal blood pressure is less than 140 mmHg on top. Try to follow the DASH guidelines (DASH stands for Dietary Approaches to Stop Hypertension) Try to limit the sodium in your diet.  Ideally, consume less than 1.5 grams (less than 1,500mg ) per day. Do not add salt when cooking or at the table.  Check the sodium amount on labels when shopping, and choose items lower in sodium when given a choice. Avoid or limit foods that already contain a lot of sodium. Eat a diet rich in fruits and vegetables and whole grains.  Return for fasting labs some time later, fast from calories for 6 hours before you come  [place] stands for "Dietary Approaches to Stop Hypertension." The DASH eating plan is a healthy eating plan that has been shown to reduce high blood pressure (hypertension). Additional health benefits may include reducing the risk of type 2 diabetes mellitus, heart disease, and stroke. The DASH eating plan may also help with weight loss. What do I need to know about the DASH eating plan? For the DASH eating plan, you will follow these general guidelines:  Choose foods with less than 150 milligrams of sodium per serving (as listed on the food label).  Use salt-free seasonings or herbs instead of table salt or sea salt.  Check with your health care provider or pharmacist before using salt substitutes.  Eat lower-sodium products. These are often labeled as "low-sodium" or "no salt added."  Eat fresh foods. Avoid eating a lot of canned foods.  Eat more vegetables, fruits, and low-fat dairy products.  Choose whole grains. Look for the word "whole" as the first  word in the ingredient list.  Choose fish and skinless chicken or [place] more often than red meat. Limit fish, poultry, and meat to 6 oz (170 g) each day.  Limit sweets, desserts, sugars, and sugary drinks.  Choose heart-healthy fats.  Eat more home-cooked food and less restaurant, buffet, and fast food.  Limit fried foods.  Do not fry foods. Cook foods using methods such as baking, boiling, grilling, and broiling instead.  When eating at a restaurant, ask that your food be prepared with less salt, or no salt if possible. What foods can I eat? Seek help from a dietitian for individual calorie needs. Grains  Whole grain or whole wheat bread. Brown rice. Whole grain or whole wheat pasta. Quinoa, bulgur, and whole grain cereals. Low-sodium cereals. Corn or whole wheat flour tortillas. Whole grain cornbread. Whole grain crackers. Low-sodium crackers. Vegetables  Fresh or frozen vegetables (raw, steamed, roasted, or grilled). Low-sodium or reduced-sodium tomato and vegetable juices. Low-sodium or reduced-sodium tomato sauce and paste. Low-sodium or reduced-sodium canned vegetables. Fruits  All fresh, canned (in natural juice), or frozen fruits. Meat and Other Protein Products  Ground beef (85% or leaner), grass-fed beef, or beef trimmed of fat. Skinless chicken or [place]. Ground chicken or [place]. Pork trimmed of fat. All fish and seafood. Eggs. Dried beans, peas, or lentils. Unsalted nuts and seeds. Unsalted canned beans. Dairy  Low-fat dairy products, such as skim or 1% milk, 2% or reduced-fat cheeses, low-fat ricotta or cottage cheese, or plain low-fat  yogurt. Low-sodium or reduced-sodium cheeses. Fats and Oils  Tub margarines without trans fats. Light or reduced-fat mayonnaise and salad dressings (reduced sodium). Avocado. Safflower, olive, or canola oils. Natural peanut or almond butter. Other  Unsalted popcorn and pretzels. The items listed above may not be a complete list of  recommended foods or beverages. Contact your dietitian for more options.  What foods are not recommended? Grains  White bread. White pasta. White rice. Refined cornbread. Bagels and croissants. Crackers that contain trans fat. Vegetables  Creamed or fried vegetables. Vegetables in a cheese sauce. Regular canned vegetables. Regular canned tomato sauce and paste. Regular tomato and vegetable juices. Fruits  Canned fruit in light or heavy syrup. Fruit juice. Meat and Other Protein Products  Fatty cuts of meat. Ribs, chicken wings, bacon, sausage, bologna, salami, chitterlings, fatback, hot dogs, bratwurst, and packaged luncheon meats. Salted nuts and seeds. Canned beans with salt. Dairy  Whole or 2% milk, cream, half-and-half, and cream cheese. Whole-fat or sweetened yogurt. Full-fat cheeses or blue cheese. Nondairy creamers and whipped toppings. Processed cheese, cheese spreads, or cheese curds. Condiments  Onion and garlic salt, seasoned salt, table salt, and sea salt. Canned and packaged gravies. Worcestershire sauce. Tartar sauce. Barbecue sauce. Teriyaki sauce. Soy sauce, including reduced sodium. Steak sauce. Fish sauce. Oyster sauce. Cocktail sauce. Horseradish. Ketchup and mustard. Meat flavorings and tenderizers. Bouillon cubes. Hot sauce. Tabasco sauce. Marinades. Taco seasonings. Relishes. Fats and Oils  Butter, stick margarine, lard, shortening, ghee, and bacon fat. Coconut, palm kernel, or palm oils. Regular salad dressings. Other  Pickles and olives. Salted popcorn and pretzels. The items listed above may not be a complete list of foods and beverages to avoid. Contact your dietitian for more information.  Where can I find more information? National Heart, Lung, and Blood Institute: travelstabloid.com This information is not intended to replace advice given to you by your health care provider. Make sure you discuss any questions you have with your  health care provider. Document Released: 01/25/2011 Document Revised: 07/14/2015 Document Reviewed: 12/10/2012 Elsevier Interactive Patient Education  2017 [employer].

## 2016-04-09 NOTE — Assessment & Plan Note (Signed)
Check labs fasting and consider statin

## 2016-04-09 NOTE — Assessment & Plan Note (Signed)
Avoid whites

## 2016-04-09 NOTE — Progress Notes (Signed)
BP 114/78   Pulse 91   Temp 98.4 F (36.9 C) (Oral)   Resp 16   Wt 178 lb 4 oz (80.9 kg)   LMP 10/27/2015   SpO2 96%   BMI 27.92 kg/m    Subjective:    Patient ID: Andrea Bell, female    DOB: 1955-02-24, 61 y.o.   MRN: [id number]  HPI: Andrea Bell is a 61 y.o. female  Chief Complaint  Patient presents with  . Follow-up    prediabetes and blood pressure    Patient is here for follow-up  HTN; well-controlled; not checking BP away from doctor; using less salt now than before; not using decongestants; knows to watch processed foods; no chest pain, no leg edema  Prediabetes; last A1c reviewed; brother and mother; trying to get off of white bread; tends to eat potatoes; not drinking soft drinks; some dry mouth, no blurred vision, getting up at night to urinate x 2; keeps water by the bed Lab Results  Component Value Date   HGBA1C 6.0 (H) 04/09/2016   High cholesterol; last total was 221; last HDL was excellent at 64; last LDL 124; maybe one egg a week; occasional hot dog, sausage, bacon No oatmeal, no grits  Flu shot today  She sees COPD; keeps oxygen in the car, keeps it with her all the time; some limitations to activity  Year-round allergies; does well on cetirizine; watery eyes  Not taking ibuprofen any more; anemia resolved; had hysterectomy for bleeding  Depression screen [facility] 2/9 04/09/2016 11/24/2015 08/29/2015  Decreased Interest 0 0 0  Down, Depressed, Hopeless 0 1 0  PHQ - 2 Score 0 1 0   Relevant past medical, surgical, family and social history reviewed Past Medical History:  Diagnosis Date  . Anemia   . Chronic back pain   . COPD (chronic obstructive pulmonary disease) (HCC)    2l home oxygen  . Dyspnea   . GERD (gastroesophageal reflux disease)   . HTN (hypertension)   . Insomnia   . Osteoarthritis   . Osteoporosis   . Tachycardia    Past Surgical History:  Procedure Laterality Date  . CYST REMOVAL TRUNK    . CYSTECTOMY     back and side  of head  . CYSTOSCOPY N/A 12/13/2015   Procedure: CYSTOSCOPY;  Surgeon: Malachy Mood, MD;  Location: [facility] ORS;  Service: Gynecology;  Laterality: N/A;  . LAPAROSCOPIC BILATERAL SALPINGO OOPHERECTOMY Bilateral 12/13/2015   Procedure: LAPAROSCOPIC BILATERAL SALPINGO OOPHORECTOMY;  Surgeon: Malachy Mood, MD;  Location: [facility] OR[facility];  Service: Gynecology;  Laterality: Bilateral;  . LAPAROSCOPIC HYSTERECTOMY N/A 12/13/2015   Procedure: HYSTERECTOMY TOTAL LAPAROSCOPIC;  Surgeon: Malachy Mood, MD;  Location: [facility] ORS;  Service: Gynecology;  Laterality: N/A;  . tear duct surgery     bilateral  . TUBAL LIGATION     Family History  Problem Relation Age of Onset  . Hypertension Mother   . Diabetes Mellitus II Mother   . Dementia Mother   . Diabetes Mother   . Cancer - Other Father     brain tumor  . Cancer Father     brain tumor  . Breast cancer Maternal Aunt 57  . Cancer Maternal Aunt     ovarian  . Breast cancer Maternal Grandmother 80  . Cancer Maternal Grandmother     breast  . Diabetes Brother   . Hypertension Brother   . COPD Brother   . Cancer Maternal Grandfather     stomach  .  Heart disease Paternal Grandmother   . Stroke Paternal Grandfather    Social History  Substance Use Topics  . Smoking status: Former Smoker    Packs/day: 0.50    Years: 15.00    Types: Cigarettes    Quit date: 04/25/2011  . Smokeless tobacco: Never Used  . Alcohol use 0.0 oz/week     Comment: occasional drinks   Interim medical history since last visit reviewed. Allergies and medications reviewed  Review of Systems Per HPI unless specifically indicated above     Objective:    BP 114/78   Pulse 91   Temp 98.4 F (36.9 C) (Oral)   Resp 16   Wt 178 lb 4 oz (80.9 kg)   LMP 10/27/2015   SpO2 96%   BMI 27.92 kg/m   Wt Readings from Last 3 Encounters:  04/09/16 178 lb 4 oz (80.9 kg)  12/13/15 182 lb (82.6 kg)  12/06/15 182 lb (82.6 kg)    Physical Exam  Constitutional: She  appears well-developed and well-nourished. No distress.  Weight loss four pounds over last 4 months  HENT:  Head: Normocephalic and atraumatic.  Eyes: EOM are normal. No scleral icterus.  Neck: No thyromegaly present.  Cardiovascular: Normal rate, regular rhythm and normal heart sounds.   No murmur heard. Pulmonary/Chest: Effort normal and breath sounds normal. No respiratory distress. She has no wheezes.  Abdominal: Soft. Bowel sounds are normal. She exhibits no distension.  Musculoskeletal: Normal range of motion. She exhibits no edema.  Neurological: She is alert. She exhibits normal muscle tone.  Skin: Skin is warm and dry. She is not diaphoretic. No pallor.  Psychiatric: She has a normal mood and affect. Her behavior is normal. Judgment and thought content normal.      Assessment & Plan:   Problem List Items Addressed This Visit      Cardiovascular and Mediastinum   Essential hypertension, benign (Chronic)    Excellent control today; try to follow DASH guidelines        Endocrine   Impaired fasting glucose (Chronic)    Avoid whites      Relevant Orders   Hemoglobin A1c (Completed)     Other   Need for hepatitis C screening test - Primary   Relevant Orders   Hepatitis C Antibody (Completed)   Medication monitoring encounter    Check labs      Relevant Orders   Comprehensive metabolic panel (Completed)   Hyperlipidemia (Chronic)    Check labs fasting and consider statin      Relevant Orders   Lipid panel (Completed)   Encounter for screening for HIV   Relevant Orders   HIV antibody (with reflex) (Completed)    Other Visit Diagnoses    Needs flu shot       Relevant Orders   Flu Vaccine QUAD 36+ mos PF IM (Fluarix & Fluzone Quad PF) (Completed)      Follow up plan: Return in about 6 months (around 10/15/2016) for regular follow-up.  An after-visit summary was printed and given to the patient at [place].  Please see the patient instructions which may  contain other information and recommendations beyond what is mentioned above in the assessment and plan.  Meds ordered this encounter  Medications  . DISCONTD: Chlorphen-Phenyleph-APAP (CORICIDIN D COLD/FLU/SINUS) 2-5-325 MG TABS    Sig: Take by mouth.  . Chlorphen-PE-Acetaminophen (CORICIDIN D COLD/FLU/SINUS PO)    Sig: Take by mouth 2 (two) times daily as needed.  Marland Kitchen DISCONTD: cetirizine (  ZYRTEC) 10 MG tablet    Sig: Take 10 mg by mouth daily.  Marland Kitchen SPIRIVA HANDIHALER 18 MCG inhalation capsule  . senna (SENOKOT) 8.6 MG tablet    Sig: Take by mouth.  . SYMBICORT 160-4.5 MCG/ACT inhaler  . cetirizine (ZYRTEC) 10 MG tablet    Sig: Take 1 tablet (10 mg total) by mouth daily.    Dispense:  90 tablet    Refill:  3    She'd like to pick this up today; cancel the 30 day supply    Orders Placed This Encounter  Procedures  . Flu Vaccine QUAD 36+ mos PF IM (Fluarix & Fluzone Quad PF)  . Hepatitis C Antibody  . HIV antibody (with reflex)  . Lipid panel  . Comprehensive metabolic panel  . Hemoglobin A1c

## 2016-04-11 LAB — HEMOGLOBIN A1C
HEMOGLOBIN A1C: 6 % — AB (ref <5.7)
MEAN PLASMA GLUCOSE: 126 mg/dL

## 2016-04-12 LAB — COMPREHENSIVE METABOLIC PANEL
ALBUMIN: 4.4 g/dL (ref 3.6–5.1)
ALT: 18 U/L (ref 6–29)
AST: 18 U/L (ref 10–35)
Alkaline Phosphatase: 78 U/L (ref 33–130)
BILIRUBIN TOTAL: 0.4 mg/dL (ref 0.2–1.2)
BUN: 25 mg/dL (ref 7–25)
CALCIUM: 10.4 mg/dL (ref 8.6–10.4)
CO2: 28 mmol/L (ref 20–31)
CREATININE: 0.9 mg/dL (ref 0.50–0.99)
Chloride: 102 mmol/L (ref 98–110)
Glucose, Bld: 83 mg/dL (ref 65–99)
Potassium: 4.4 mmol/L (ref 3.5–5.3)
SODIUM: 142 mmol/L (ref 135–146)
TOTAL PROTEIN: 7.3 g/dL (ref 6.1–8.1)

## 2016-04-12 LAB — LIPID PANEL
CHOLESTEROL: 247 mg/dL — AB (ref <200)
HDL: 69 mg/dL (ref >50)
LDL Cholesterol: 157 mg/dL — ABNORMAL HIGH (ref <100)
Total CHOL/HDL Ratio: 3.6 Ratio (ref <5.0)
Triglycerides: 103 mg/dL (ref <150)
VLDL: 21 mg/dL (ref <30)

## 2016-04-12 LAB — HIV ANTIBODY (ROUTINE TESTING W REFLEX): HIV: NONREACTIVE

## 2016-04-12 LAB — HEPATITIS C ANTIBODY: HCV AB: NEGATIVE

## 2016-04-13 ENCOUNTER — Other Ambulatory Visit: Payer: Self-pay | Admitting: Family Medicine

## 2016-04-13 DIAGNOSIS — E782 Mixed hyperlipidemia: Secondary | ICD-10-CM

## 2016-04-13 DIAGNOSIS — Z5181 Encounter for therapeutic drug level monitoring: Secondary | ICD-10-CM

## 2016-04-13 MED ORDER — ATORVASTATIN CALCIUM 20 MG PO TABS: 20.0000 mg | ORAL_TABLET | Freq: Every day | ORAL | 1 refills | Status: DC

## 2016-04-13 NOTE — Assessment & Plan Note (Signed)
Recheck sgpt in 6 weeks

## 2016-04-13 NOTE — Progress Notes (Signed)
Start statin Recheck sgpt and lipids in 6 weeks Thank you

## 2016-04-13 NOTE — Assessment & Plan Note (Signed)
Start statin; recheck lipids in 6 weeks 

## 2016-06-07 DIAGNOSIS — J439 Emphysema, unspecified: Secondary | ICD-10-CM | POA: Diagnosis not present

## 2016-08-17 ENCOUNTER — Other Ambulatory Visit: Payer: Self-pay

## 2016-08-20 ENCOUNTER — Other Ambulatory Visit: Payer: Self-pay

## 2016-08-20 NOTE — Telephone Encounter (Signed)
Please call and clarify with patient - is she still taking Potassium supplement? According to our records, she would have run out in March 2018. Clarify and send back to Dr. Sanda Klein or myself to provide refills. Thank you!

## 2016-08-21 MED ORDER — TRAZODONE HCL 100 MG PO TABS: 100.0000 mg | ORAL_TABLET | Freq: Every evening | ORAL | 1 refills | Status: DC | PRN

## 2016-08-21 MED ORDER — LISINOPRIL-HYDROCHLOROTHIAZIDE 10-12.5 MG PO TABS: 1.0000 | ORAL_TABLET | Freq: Every day | ORAL | 1 refills | Status: DC

## 2016-08-21 MED ORDER — POTASSIUM CHLORIDE ER 10 MEQ PO TBCR: 10.0000 meq | EXTENDED_RELEASE_TABLET | Freq: Every day | ORAL | 1 refills | Status: DC

## 2016-08-21 NOTE — Telephone Encounter (Signed)
Patient called states she is still on the potassium and taking daily?

## 2016-08-27 ENCOUNTER — Other Ambulatory Visit: Payer: Self-pay

## 2016-08-27 MED ORDER — LISINOPRIL-HYDROCHLOROTHIAZIDE 10-12.5 MG PO TABS: 1.0000 | ORAL_TABLET | Freq: Every day | ORAL | 1 refills | Status: DC

## 2016-08-27 MED ORDER — POTASSIUM CHLORIDE ER 10 MEQ PO TBCR: 10.0000 meq | EXTENDED_RELEASE_TABLET | Freq: Every day | ORAL | 1 refills | Status: DC

## 2016-08-27 MED ORDER — TRAZODONE HCL 100 MG PO TABS: 100.0000 mg | ORAL_TABLET | Freq: Every evening | ORAL | 1 refills | Status: DC | PRN

## 2016-08-27 NOTE — Telephone Encounter (Signed)
She needs these rx's to go to [facility] order 90 day

## 2016-09-28 IMAGING — CR DG CHEST 2V
2 series · 2 of 2 positions shown · non-contrast
Comparison: Radiographs 01/16/2015.  CT 01/18/2015.

CLINICAL DATA: COPD exacerbation.

EXAM:
CHEST  2 VIEW

[chest pa]
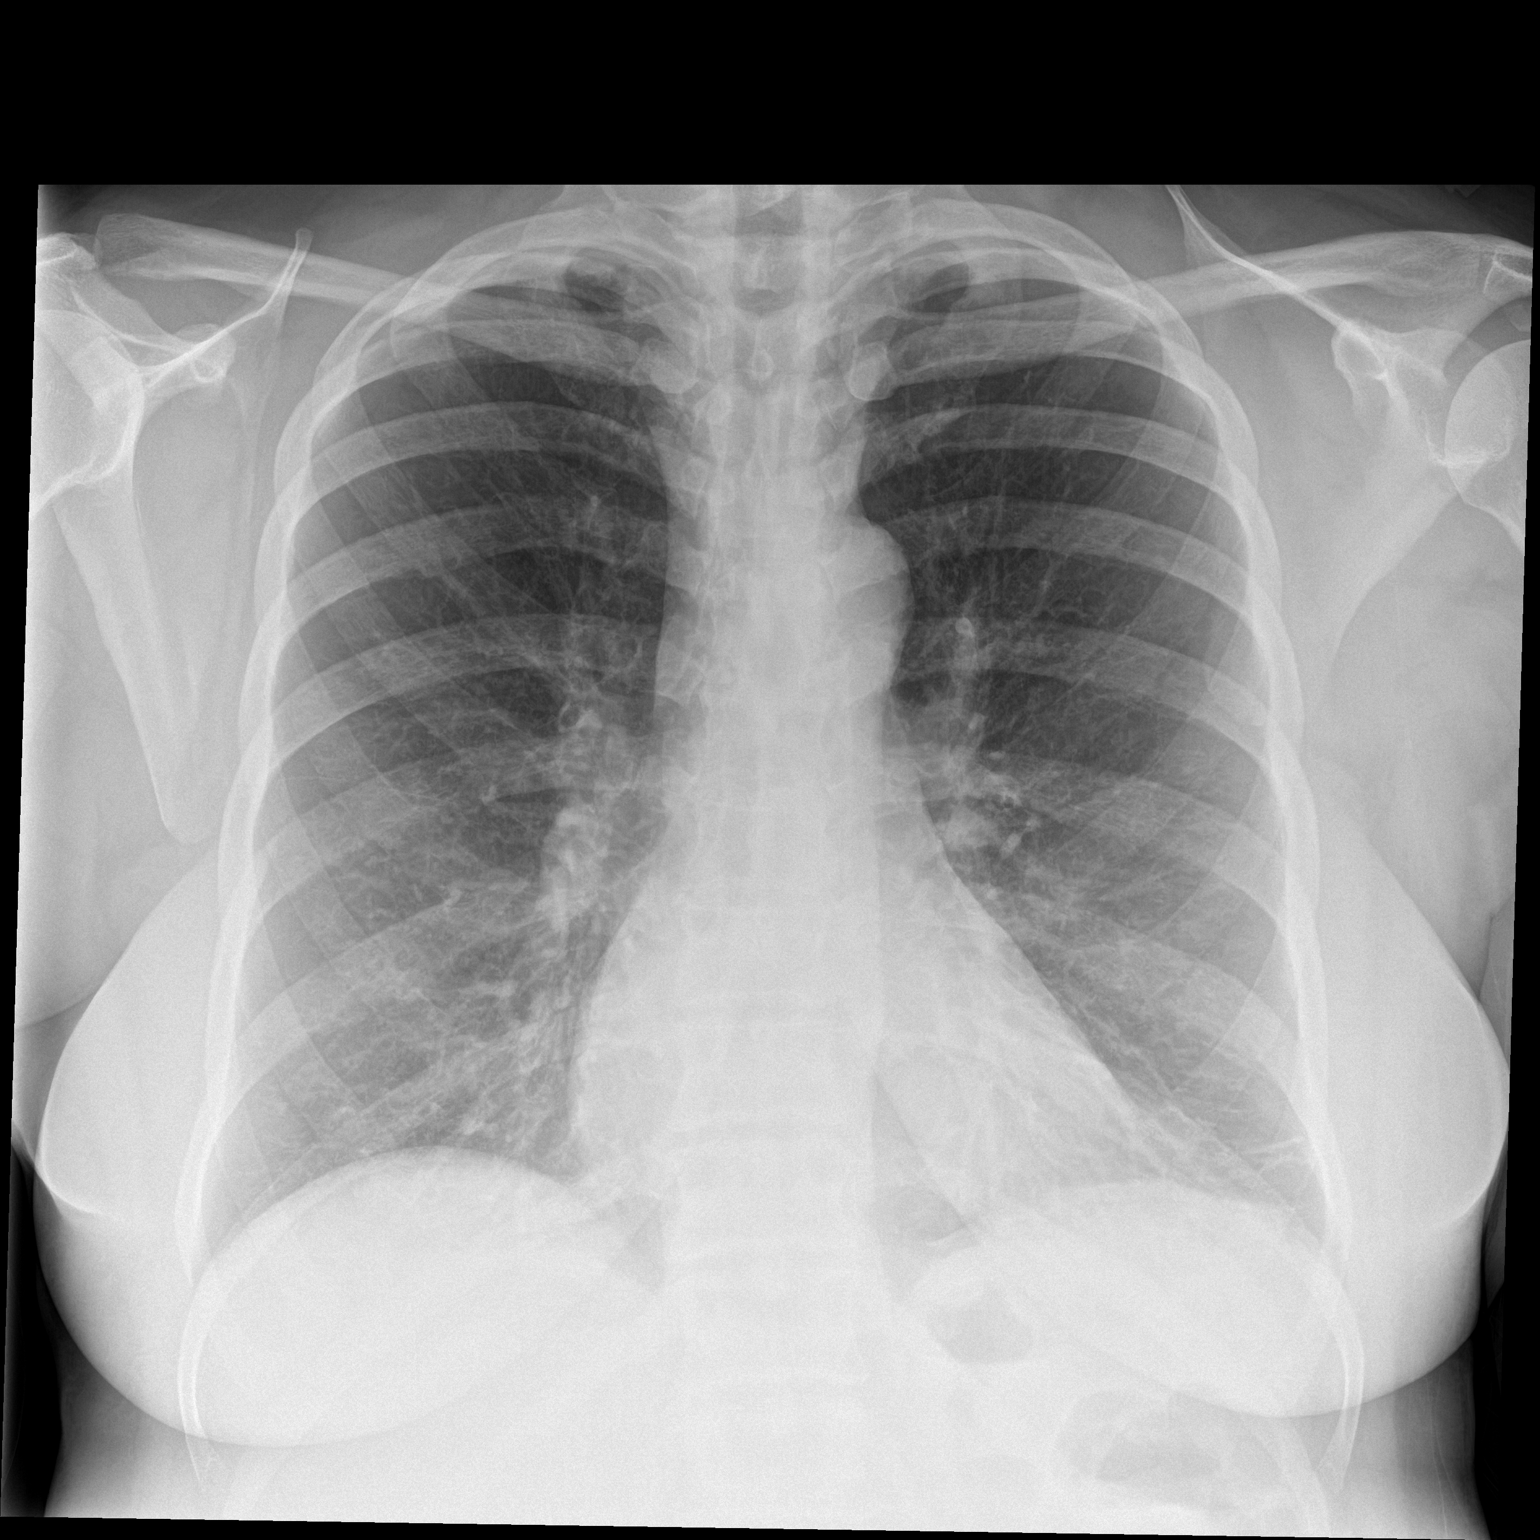

[chest lat]
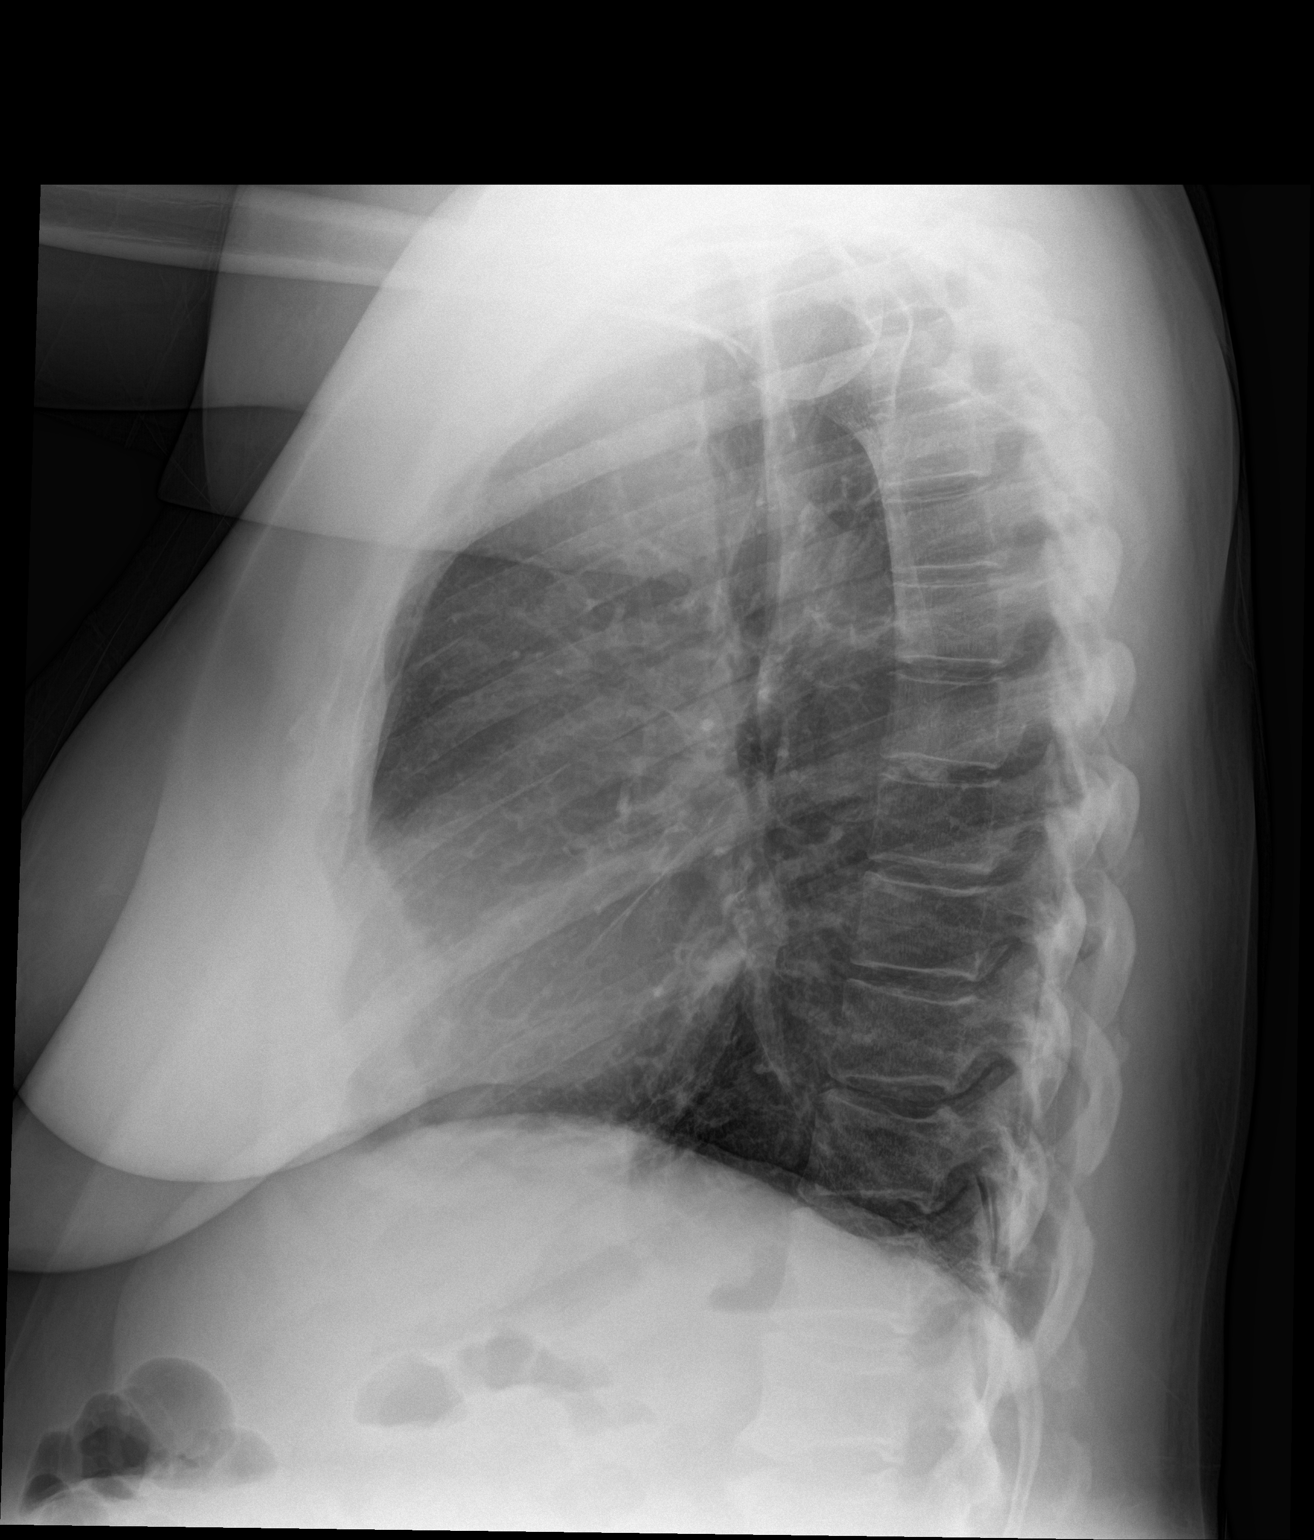

[2 of 2 positions shown; findings below may reference images not displayed]

FINDINGS: The heart size and mediastinal contours are stable. The lungs remain
hyperinflated with stable chronic atelectasis or scarring at the
left lung base. The right lung is clear. There is no pleural
effusion or pneumothorax. The bones appear unchanged.
IMPRESSION: Stable chest. Chronic obstructive pulmonary disease with chronic
left lower lobe atelectasis or scarring.

## 2016-10-09 ENCOUNTER — Ambulatory Visit (INDEPENDENT_AMBULATORY_CARE_PROVIDER_SITE_OTHER): Payer: Medicare HMO | Admitting: Family Medicine

## 2016-10-09 ENCOUNTER — Other Ambulatory Visit: Payer: Self-pay | Admitting: Family Medicine

## 2016-10-09 ENCOUNTER — Encounter: Payer: Self-pay | Admitting: Family Medicine

## 2016-10-09 VITALS — BP 122/72 | HR 67 | Temp 98.6°F | Resp 16 | Wt 181.1 lb

## 2016-10-09 DIAGNOSIS — Z1231 Encounter for screening mammogram for malignant neoplasm of breast: Secondary | ICD-10-CM | POA: Diagnosis not present

## 2016-10-09 DIAGNOSIS — E782 Mixed hyperlipidemia: Secondary | ICD-10-CM

## 2016-10-09 DIAGNOSIS — R945 Abnormal results of liver function studies: Secondary | ICD-10-CM | POA: Diagnosis not present

## 2016-10-09 DIAGNOSIS — R748 Abnormal levels of other serum enzymes: Secondary | ICD-10-CM | POA: Diagnosis not present

## 2016-10-09 DIAGNOSIS — J432 Centrilobular emphysema: Secondary | ICD-10-CM

## 2016-10-09 DIAGNOSIS — R739 Hyperglycemia, unspecified: Secondary | ICD-10-CM | POA: Diagnosis not present

## 2016-10-09 DIAGNOSIS — H1013 Acute atopic conjunctivitis, bilateral: Secondary | ICD-10-CM | POA: Diagnosis not present

## 2016-10-09 DIAGNOSIS — Z5181 Encounter for therapeutic drug level monitoring: Secondary | ICD-10-CM | POA: Diagnosis not present

## 2016-10-09 DIAGNOSIS — I10 Essential (primary) hypertension: Secondary | ICD-10-CM | POA: Diagnosis not present

## 2016-10-09 DIAGNOSIS — Z1239 Encounter for other screening for malignant neoplasm of breast: Secondary | ICD-10-CM

## 2016-10-09 DIAGNOSIS — T380X5A Adverse effect of glucocorticoids and synthetic analogues, initial encounter: Secondary | ICD-10-CM

## 2016-10-09 DIAGNOSIS — J439 Emphysema, unspecified: Secondary | ICD-10-CM | POA: Diagnosis not present

## 2016-10-09 DIAGNOSIS — H1010 Acute atopic conjunctivitis, unspecified eye: Secondary | ICD-10-CM | POA: Insufficient documentation

## 2016-10-09 DIAGNOSIS — R0609 Other forms of dyspnea: Secondary | ICD-10-CM | POA: Diagnosis not present

## 2016-10-09 DIAGNOSIS — G479 Sleep disorder, unspecified: Secondary | ICD-10-CM | POA: Diagnosis not present

## 2016-10-09 MED ORDER — METOPROLOL SUCCINATE ER 25 MG PO TB24: 12.5000 mg | ORAL_TABLET | Freq: Every day | ORAL | 3 refills | Status: DC

## 2016-10-09 MED ORDER — ALBUTEROL SULFATE (2.5 MG/3ML) 0.083% IN NEBU: 2.5000 mg | INHALATION_SOLUTION | RESPIRATORY_TRACT | 3 refills | Status: DC | PRN

## 2016-10-09 MED ORDER — OLOPATADINE HCL 0.2 % OP SOLN: 1.0000 [drp] | Freq: Every day | OPHTHALMIC | 3 refills | Status: DC

## 2016-10-09 NOTE — Patient Instructions (Addendum)
Try to use PLAIN allergy medicine without the decongestant Avoid: phenylephrine, phenylpropanolamine, and pseudoephredine I would suggest that you not take both spiriva and ipratropium (which is in your nebulizer medicine) I've taken the liberty of prescribing you plain albuterol for your nebulizer which I hope is okay with your lung doctor Please follow whatever advice or changes he makes We'll contact you with your lab results Try to limit saturated fats in your diet (bologna, hot dogs, barbeque, cheeseburgers, hamburgers, steak, bacon, sausage, cheese, etc.) and get more fresh fruits, vegetables, and whole grains Please do call to schedule your mammogram; the number to schedule one at either [facility] or [facility] is [phone]

## 2016-10-09 NOTE — Assessment & Plan Note (Signed)
Check A1c. 

## 2016-10-09 NOTE — Assessment & Plan Note (Signed)
Managed primarily by pulmonologist; I had made the changes to her neb medicine before realizing this; she'll clear these changes with Dr. Raul Del

## 2016-10-09 NOTE — Progress Notes (Signed)
BP 122/72   Pulse 67   Temp 98.6 F (37 C) (Oral)   Resp 16   Wt 181 lb 1.6 oz (82.1 kg)   LMP 10/27/2015   SpO2 97%   BMI 28.36 kg/m    Subjective:    Patient ID: Andrea Bell, female    DOB: March 11, 1955, 61 y.o.   MRN: [id number]  HPI: Andrea Bell is a 61 y.o. female  Chief Complaint  Patient presents with  . Follow-up     6 months  . Medication Refill   HPI  Patient is here for recheck, 6 month f/u She has high cholesterol; she was on lipitor for Feb and March; finished in early April; no problems at all; she did not know to come back for fasting labs and just took what was prescribed; does eat steak; rarely eats eggs; not much cheese; not much oatmeal  Lab Results  Component Value Date   CHOL 247 (H) 04/09/2016   CHOL 221 (H) 09/29/2015   CHOL 185 04/06/2015   Lab Results  Component Value Date   HDL 69 04/09/2016   HDL 64 09/29/2015   HDL 50 04/06/2015   Lab Results  Component Value Date   LDLCALC 157 (H) 04/09/2016   LDLCALC 124 09/29/2015   LDLCALC 108 (H) 04/06/2015   Lab Results  Component Value Date   TRIG 103 04/09/2016   TRIG 164 (H) 09/29/2015   TRIG 134 04/06/2015   Lab Results  Component Value Date   CHOLHDL 3.6 04/09/2016   CHOLHDL 3.5 09/29/2015   No results found for: LDLDIRECT  She has high blood pressure She has coricidin D with PE in it in her med list She has oxygen for her COPD; has to have that on hand; sleeps with that; 2 l/m; sees Dr. Raul Del, pulmonologist; using albuterol about twice a day; spiriva once a day; also taking symbicort Using meclizine for dizziness; working well Using nasal spray and zyrtec for allergies; keeps tissue by her in the car  Depression screen [facility] 2/9 10/09/2016 04/09/2016 11/24/2015 08/29/2015  Decreased Interest 0 0 0 0  Down, Depressed, Hopeless 0 0 1 0  PHQ - 2 Score 0 0 1 0    Relevant past medical, surgical, family and social history reviewed Past Medical History:  Diagnosis Date  .  Anemia   . Chronic back pain   . COPD (chronic obstructive pulmonary disease) (HCC)    2l home oxygen  . Dyspnea   . GERD (gastroesophageal reflux disease)   . HTN (hypertension)   . Insomnia   . Osteoarthritis   . Osteoporosis   . Tachycardia    Past Surgical History:  Procedure Laterality Date  . CYST REMOVAL TRUNK    . CYSTECTOMY     back and side of head  . CYSTOSCOPY N/A 12/13/2015   Procedure: CYSTOSCOPY;  Surgeon: Malachy Mood, MD;  Location: [facility] ORS;  Service: Gynecology;  Laterality: N/A;  . LAPAROSCOPIC BILATERAL SALPINGO OOPHERECTOMY Bilateral 12/13/2015   Procedure: LAPAROSCOPIC BILATERAL SALPINGO OOPHORECTOMY;  Surgeon: Malachy Mood, MD;  Location: [facility] ORS;  Service: Gynecology;  Laterality: Bilateral;  . LAPAROSCOPIC HYSTERECTOMY N/A 12/13/2015   Procedure: HYSTERECTOMY TOTAL LAPAROSCOPIC;  Surgeon: Malachy Mood, MD;  Location: [facility] ORS;  Service: Gynecology;  Laterality: N/A;  . tear duct surgery     bilateral  . TUBAL LIGATION     Family History  Problem Relation Age of Onset  . Hypertension Mother   . Diabetes Mellitus II Mother   .  Dementia Mother   . Diabetes Mother   . Cancer - Other Father        brain tumor  . Cancer Father        brain tumor  . Breast cancer Maternal Aunt 57  . Cancer Maternal Aunt        ovarian  . Breast cancer Maternal Grandmother 36  . Cancer Maternal Grandmother        breast  . Diabetes Brother   . Hypertension Brother   . COPD Brother   . Cancer Maternal Grandfather        stomach  . Heart disease Paternal Grandmother   . Stroke Paternal Grandfather    Social History   Social History  . Marital status: Married    Spouse name: N/A  . Number of children: N/A  . Years of education: N/A   Occupational History  . Not on file.   Social History Main Topics  . Smoking status: Former Smoker    Packs/day: 0.50    Years: 15.00    Types: Cigarettes    Quit date: 04/25/2011  . Smokeless tobacco: Never  Used  . Alcohol use 0.0 oz/week     Comment: occasional drinks  . Drug use: No  . Sexual activity: Yes   Other Topics Concern  . Not on file   Social History Narrative   Lives at home with husband.    Interim medical history since last visit reviewed. Allergies and medications reviewed  Review of Systems Per HPI unless specifically indicated above     Objective:    BP 122/72   Pulse 67   Temp 98.6 F (37 C) (Oral)   Resp 16   Wt 181 lb 1.6 oz (82.1 kg)   LMP 10/27/2015   SpO2 97%   BMI 28.36 kg/m   Wt Readings from Last 3 Encounters:  10/09/16 181 lb 1.6 oz (82.1 kg)  04/09/16 178 lb 4 oz (80.9 kg)  12/13/15 182 lb (82.6 kg)    Physical Exam  Constitutional: She appears well-developed and well-nourished. No distress.  Weight gain 3 pounds over last 6 months  HENT:  Head: Normocephalic and atraumatic.  Eyes: EOM are normal. No scleral icterus.  Neck: No thyromegaly present.  Cardiovascular: Normal rate, regular rhythm and normal heart sounds.   No murmur heard. Pulmonary/Chest: Effort normal and breath sounds normal. No respiratory distress. She has no wheezes.  Abdominal: Soft. Bowel sounds are normal. She exhibits no distension.  Musculoskeletal: Normal range of motion. She exhibits no edema.  Neurological: She is alert. She exhibits normal muscle tone.  Skin: Skin is warm and dry. She is not diaphoretic. No pallor.  Psychiatric: She has a normal mood and affect. Her behavior is normal. Judgment and thought content normal.   Results for orders placed or performed in visit on 04/09/16  Hepatitis C Antibody  Result Value Ref Range   HCV Ab NEGATIVE NEGATIVE  HIV antibody (with reflex)  Result Value Ref Range   HIV 1&2 Ab, 4th Generation NONREACTIVE NONREACTIVE  Lipid panel  Result Value Ref Range   Cholesterol 247 (H) <200 mg/dL   Triglycerides 103 <150 mg/dL   HDL 69 >50 mg/dL   Total CHOL/HDL Ratio 3.6 <5.0 Ratio   VLDL 21 <30 mg/dL   LDL  Cholesterol 157 (H) <100 mg/dL  Comprehensive metabolic panel  Result Value Ref Range   Sodium 142 135 - 146 mmol/L   Potassium 4.4 3.5 - 5.3 mmol/L  Chloride 102 98 - 110 mmol/L   CO2 28 20 - 31 mmol/L   Glucose, Bld 83 65 - 99 mg/dL   BUN 25 7 - 25 mg/dL   Creat 0.90 0.50 - 0.99 mg/dL   Total Bilirubin 0.4 0.2 - 1.2 mg/dL   Alkaline Phosphatase 78 33 - 130 U/L   AST 18 10 - 35 U/L   ALT 18 6 - 29 U/L   Total Protein 7.3 6.1 - 8.1 g/dL   Albumin 4.4 3.6 - 5.1 g/dL   Calcium 10.4 8.6 - 10.4 mg/dL  Hemoglobin A1c  Result Value Ref Range   Hgb A1c MFr Bld 6.0 (H) <5.7 %   Mean Plasma Glucose 126 mg/dL      Assessment & Plan:   Problem List Items Addressed This Visit      Cardiovascular and Mediastinum   Essential hypertension, benign (Chronic)    Continue the current regimen; try to follow the DASH guidelines      Relevant Medications   metoprolol succinate (TOPROL-XL) 25 MG 24 hr tablet     Respiratory   COPD (chronic obstructive pulmonary disease) (HCC) (Chronic)    Managed primarily by pulmonologist; I had made the changes to her neb medicine before realizing this; she'll clear these changes with Dr. Raul Del      Relevant Medications   albuterol (PROVENTIL) (2.5 MG/3ML) 0.083% nebulizer solution     Other   Steroid-induced hyperglycemia    Check A1c      Relevant Orders   Hemoglobin A1c   Medication monitoring encounter    Check sgpt and Cr and K+      Relevant Orders   COMPLETE METABOLIC PANEL WITH GFR   Hyperlipidemia - Primary (Chronic)    Limit saturated fats; check lipids; start back on statin once we see the lab results      Relevant Medications   metoprolol succinate (TOPROL-XL) 25 MG 24 hr tablet   Other Relevant Orders   Lipid panel   Allergic conjunctivitis    Will start new ophth eye drop for allergies; continue the nasal spray and cetirizine       Other Visit Diagnoses    Screening for breast cancer       Relevant Orders   MM  Digital Screening       Follow up plan: Return in about 6 months (around 04/11/2017) for twenty minute follow-up with fasting labs.  An after-visit summary was printed and given to the patient at [place].  Please see the patient instructions which may contain other information and recommendations beyond what is mentioned above in the assessment and plan.  Meds ordered this encounter  Medications  . albuterol (PROVENTIL) (2.5 MG/3ML) 0.083% nebulizer solution    Sig: Take 3 mLs (2.5 mg total) by nebulization every 4 (four) hours as needed for wheezing or shortness of breath.    Dispense:  150 mL    Refill:  3  . metoprolol succinate (TOPROL-XL) 25 MG 24 hr tablet    Sig: Take 0.5 tablets (12.5 mg total) by mouth daily.    Dispense:  45 tablet    Refill:  3  . Olopatadine HCl 0.2 % SOLN    Sig: Apply 1 drop to eye daily. In each eye    Dispense:  7.5 mL    Refill:  3    Orders Placed This Encounter  Procedures  . MM Digital Screening  . Lipid panel  . COMPLETE METABOLIC PANEL WITH GFR  .  Hemoglobin A1c

## 2016-10-09 NOTE — Assessment & Plan Note (Signed)
Continue the current regimen; try to follow the DASH guidelines

## 2016-10-09 NOTE — Assessment & Plan Note (Signed)
Will start new ophth eye drop for allergies; continue the nasal spray and cetirizine

## 2016-10-09 NOTE — Assessment & Plan Note (Signed)
Limit saturated fats; check lipids; start back on statin once we see the lab results

## 2016-10-09 NOTE — Assessment & Plan Note (Signed)
Check sgpt and Cr and K+ 

## 2016-10-10 LAB — LIPID PANEL
CHOLESTEROL: 246 mg/dL — AB (ref <200)
HDL: 65 mg/dL (ref >50)
LDL Cholesterol: 139 mg/dL — ABNORMAL HIGH (ref <100)
TRIGLYCERIDES: 208 mg/dL — AB (ref <150)
Total CHOL/HDL Ratio: 3.8 Ratio (ref <5.0)
VLDL: 42 mg/dL — ABNORMAL HIGH (ref <30)

## 2016-10-10 LAB — COMPLETE METABOLIC PANEL WITH GFR
ALBUMIN: 4.5 g/dL (ref 3.6–5.1)
ALK PHOS: 85 U/L (ref 33–130)
ALT: 33 U/L — ABNORMAL HIGH (ref 6–29)
AST: 40 U/L — ABNORMAL HIGH (ref 10–35)
BILIRUBIN TOTAL: 0.5 mg/dL (ref 0.2–1.2)
BUN: 18 mg/dL (ref 7–25)
CALCIUM: 10.3 mg/dL (ref 8.6–10.4)
CO2: 27 mmol/L (ref 20–32)
Chloride: 100 mmol/L (ref 98–110)
Creat: 0.9 mg/dL (ref 0.50–0.99)
GFR, EST AFRICAN AMERICAN: 80 mL/min (ref >=60)
GFR, EST NON AFRICAN AMERICAN: 70 mL/min (ref >=60)
Glucose, Bld: 104 mg/dL — ABNORMAL HIGH (ref 65–99)
Potassium: 3.6 mmol/L (ref 3.5–5.3)
Sodium: 143 mmol/L (ref 135–146)
TOTAL PROTEIN: 7.1 g/dL (ref 6.1–8.1)

## 2016-10-10 LAB — HEMOGLOBIN A1C
Hgb A1c MFr Bld: 6 % — ABNORMAL HIGH (ref <5.7)
MEAN PLASMA GLUCOSE: 126 mg/dL

## 2016-10-11 ENCOUNTER — Other Ambulatory Visit: Payer: Self-pay

## 2016-10-11 DIAGNOSIS — R748 Abnormal levels of other serum enzymes: Secondary | ICD-10-CM

## 2016-10-12 LAB — HEPATITIS PANEL, ACUTE
HCV Ab: NONREACTIVE
Hep A IgM: 0
Hep B C IgM: NONREACTIVE
Hepatitis B Surface Ag: NONREACTIVE

## 2016-10-16 ENCOUNTER — Encounter: Payer: Self-pay | Admitting: Family Medicine

## 2016-10-16 DIAGNOSIS — R945 Abnormal results of liver function studies: Secondary | ICD-10-CM | POA: Insufficient documentation

## 2016-10-16 DIAGNOSIS — R7989 Other specified abnormal findings of blood chemistry: Secondary | ICD-10-CM | POA: Insufficient documentation

## 2016-11-14 ENCOUNTER — Ambulatory Visit
Admission: RE | Admit: 2016-11-14 | Discharge: 2016-11-14 | Disposition: A | Discharge status: Home / Self Care | Payer: Medicare HMO | Source: Ambulatory Visit | Attending: Family Medicine | Admitting: Family Medicine

## 2016-11-14 DIAGNOSIS — Z1231 Encounter for screening mammogram for malignant neoplasm of breast: Secondary | ICD-10-CM | POA: Diagnosis not present

## 2016-11-14 DIAGNOSIS — Z1239 Encounter for other screening for malignant neoplasm of breast: Secondary | ICD-10-CM

## 2016-11-16 ENCOUNTER — Other Ambulatory Visit: Payer: Self-pay | Admitting: Family Medicine

## 2016-11-16 ENCOUNTER — Other Ambulatory Visit: Payer: Self-pay | Admitting: Advanced Practice Midwife

## 2016-11-16 DIAGNOSIS — N6310 Unspecified lump in the right breast, unspecified quadrant: Secondary | ICD-10-CM

## 2016-11-16 DIAGNOSIS — R928 Other abnormal and inconclusive findings on diagnostic imaging of breast: Secondary | ICD-10-CM

## 2016-11-19 ENCOUNTER — Telehealth: Payer: Self-pay | Admitting: Family Medicine

## 2016-11-19 NOTE — Telephone Encounter (Signed)
Additional breast imaging has been ordered Please contact patient, let her know we'll be hoping for good news on her imaging; make sure she calls if she hasn't heard anything about her tests in 1 week Thank you

## 2016-11-20 NOTE — Telephone Encounter (Signed)
Re called patient she was confused she just went for mammo last week will notify us if she has not heard anything by the end of the week for additional imaging.

## 2016-11-20 NOTE — Telephone Encounter (Signed)
Patient notified states received last wednesday?

## 2016-11-20 NOTE — Telephone Encounter (Signed)
Please see my note again and check the imaging report; thank you If she has already had the additional imaging, then I haven't received a copy of it and would need to get those results

## 2016-11-29 ENCOUNTER — Ambulatory Visit: Payer: Medicare HMO

## 2016-11-29 ENCOUNTER — Other Ambulatory Visit: Payer: Medicare HMO

## 2016-12-06 ENCOUNTER — Ambulatory Visit
Admission: RE | Admit: 2016-12-06 | Discharge: 2016-12-06 | Disposition: A | Discharge status: Home / Self Care | Payer: Medicare HMO | Source: Ambulatory Visit | Attending: Family Medicine | Admitting: Family Medicine

## 2016-12-06 DIAGNOSIS — N6489 Other specified disorders of breast: Secondary | ICD-10-CM | POA: Diagnosis not present

## 2016-12-06 DIAGNOSIS — R928 Other abnormal and inconclusive findings on diagnostic imaging of breast: Secondary | ICD-10-CM

## 2016-12-06 DIAGNOSIS — N6312 Unspecified lump in the right breast, upper inner quadrant: Secondary | ICD-10-CM | POA: Diagnosis not present

## 2016-12-06 DIAGNOSIS — N6001 Solitary cyst of right breast: Secondary | ICD-10-CM | POA: Insufficient documentation

## 2016-12-06 DIAGNOSIS — N6310 Unspecified lump in the right breast, unspecified quadrant: Secondary | ICD-10-CM

## 2016-12-20 DIAGNOSIS — J449 Chronic obstructive pulmonary disease, unspecified: Secondary | ICD-10-CM | POA: Diagnosis not present

## 2017-01-19 DIAGNOSIS — J449 Chronic obstructive pulmonary disease, unspecified: Secondary | ICD-10-CM | POA: Diagnosis not present

## 2017-02-19 DIAGNOSIS — J449 Chronic obstructive pulmonary disease, unspecified: Secondary | ICD-10-CM | POA: Diagnosis not present

## 2017-03-22 DIAGNOSIS — J449 Chronic obstructive pulmonary disease, unspecified: Secondary | ICD-10-CM | POA: Diagnosis not present

## 2017-04-11 ENCOUNTER — Ambulatory Visit (INDEPENDENT_AMBULATORY_CARE_PROVIDER_SITE_OTHER): Payer: Medicare HMO | Admitting: Family Medicine

## 2017-04-11 ENCOUNTER — Encounter: Payer: Self-pay | Admitting: Family Medicine

## 2017-04-11 VITALS — BP 144/94 | HR 88 | Temp 98.3°F | Resp 14 | Wt 178.0 lb

## 2017-04-11 DIAGNOSIS — R7301 Impaired fasting glucose: Secondary | ICD-10-CM

## 2017-04-11 DIAGNOSIS — F40231 Fear of injections and transfusions: Secondary | ICD-10-CM

## 2017-04-11 DIAGNOSIS — Z23 Encounter for immunization: Secondary | ICD-10-CM | POA: Diagnosis not present

## 2017-04-11 DIAGNOSIS — R945 Abnormal results of liver function studies: Secondary | ICD-10-CM | POA: Diagnosis not present

## 2017-04-11 DIAGNOSIS — J432 Centrilobular emphysema: Secondary | ICD-10-CM | POA: Diagnosis not present

## 2017-04-11 DIAGNOSIS — Z5181 Encounter for therapeutic drug level monitoring: Secondary | ICD-10-CM

## 2017-04-11 DIAGNOSIS — E782 Mixed hyperlipidemia: Secondary | ICD-10-CM

## 2017-04-11 DIAGNOSIS — J449 Chronic obstructive pulmonary disease, unspecified: Secondary | ICD-10-CM | POA: Diagnosis not present

## 2017-04-11 DIAGNOSIS — G4733 Obstructive sleep apnea (adult) (pediatric): Secondary | ICD-10-CM | POA: Diagnosis not present

## 2017-04-11 DIAGNOSIS — R0609 Other forms of dyspnea: Secondary | ICD-10-CM | POA: Diagnosis not present

## 2017-04-11 DIAGNOSIS — R7989 Other specified abnormal findings of blood chemistry: Secondary | ICD-10-CM

## 2017-04-11 MED ORDER — ALPRAZOLAM 0.5 MG PO TABS: ORAL_TABLET | ORAL | 2 refills | Status: DC

## 2017-04-11 NOTE — Progress Notes (Signed)
BP (!) 144/94   Pulse 88   Temp 98.3 F (36.8 C) (Oral)   Resp 14   Wt 178 lb (80.7 kg)   LMP 10/27/2015   SpO2 92%   BMI 27.88 kg/m    Subjective:    Patient ID: Andrea Bell, female    DOB: 1955/10/14, 62 y.o.   MRN: [id number]  HPI: Andrea Bell is a 62 y.o. female  Chief Complaint  Patient presents with  . Follow-up    6 months    HPI Here for f/u; not checking BP away from doctor; drinking more water; more thirsty and hot flashes  Prediabetes; not eating much candy, not drinking sweet beverages; not much bread and pasta; mother and brother had diabetes  COPD; good days and bad days; uses oxygen when needed; not smoking  Received flu shot, but had serious needle phobia [facility] web site reviewed; one Rx from 2017 only  Here for f/u BP high today, but stressed over thinking about blood work Since childhood  Depression screen [facility] 2/9 04/11/2017 10/09/2016 04/09/2016 11/24/2015 08/29/2015  Decreased Interest 0 0 0 0 0  Down, Depressed, Hopeless 0 0 0 1 0  PHQ - 2 Score 0 0 0 1 0    Relevant past medical, surgical, family and social history reviewed Past Medical History:  Diagnosis Date  . Anemia   . Chronic back pain   . COPD (chronic obstructive pulmonary disease) (HCC)    2l home oxygen  . Dyspnea   . GERD (gastroesophageal reflux disease)   . HTN (hypertension)   . Insomnia   . Osteoarthritis   . Osteoporosis   . Tachycardia    Past Surgical History:  Procedure Laterality Date  . CYST REMOVAL TRUNK    . CYSTECTOMY     back and side of head  . CYSTOSCOPY N/A 12/13/2015   Procedure: CYSTOSCOPY;  Surgeon: Malachy Mood, MD;  Location: [facility];  Service: Gynecology;  Laterality: N/A;  . LAPAROSCOPIC BILATERAL SALPINGO OOPHERECTOMY Bilateral 12/13/2015   Procedure: LAPAROSCOPIC BILATERAL SALPINGO OOPHORECTOMY;  Surgeon: Malachy Mood, MD;  Location: [facility];  Service: Gynecology;  Laterality: Bilateral;  . LAPAROSCOPIC HYSTERECTOMY N/A  12/13/2015   Procedure: HYSTERECTOMY TOTAL LAPAROSCOPIC;  Surgeon: Malachy Mood, MD;  Location: [facility] OR[facility];  Service: Gynecology;  Laterality: N/A;  . tear duct surgery     bilateral  . TUBAL LIGATION     Family History  Problem Relation Age of Onset  . Hypertension Mother   . Diabetes Mellitus II Mother   . Dementia Mother   . Diabetes Mother   . Cancer - Other Father        brain tumor  . Cancer Father        brain tumor  . Breast cancer Maternal Aunt 57  . Cancer Maternal Aunt        ovarian  . Breast cancer Maternal Grandmother 14  . Cancer Maternal Grandmother        breast  . Diabetes Brother   . Hypertension Brother   . COPD Brother   . Cancer Maternal Grandfather        stomach  . Heart disease Paternal Grandmother   . Stroke Paternal Grandfather    Social History   Tobacco Use  . Smoking status: Former Smoker    Packs/day: 0.50    Years: 15.00    Pack years: 7.50    Types: Cigarettes    Last attempt to quit: 04/25/2011    Years  since quitting: 5.9  . Smokeless tobacco: Never Used  Substance Use Topics  . Alcohol use: Yes    Alcohol/week: 0.0 oz    Comment: occasional drinks  . Drug use: No    Interim medical history since last visit reviewed. Allergies and medications reviewed  Review of Systems Per HPI unless specifically indicated above     Objective:    BP (!) 144/94   Pulse 88   Temp 98.3 F (36.8 C) (Oral)   Resp 14   Wt 178 lb (80.7 kg)   LMP 10/27/2015   SpO2 92%   BMI 27.88 kg/m   Wt Readings from Last 3 Encounters:  04/11/17 178 lb (80.7 kg)  10/09/16 181 lb 1.6 oz (82.1 kg)  04/09/16 178 lb 4 oz (80.9 kg)    Today's Vitals   04/11/17 1038  BP: (!) 144/94  Pulse: 88  Resp: 14  Temp: 98.3 F (36.8 C)  TempSrc: Oral  SpO2: 92%  Weight: 178 lb (80.7 kg)    Physical Exam  Constitutional: She appears well-developed and well-nourished. No distress.  Weight loss 3 pounds over last 6 months  HENT:  Head: Normocephalic  and atraumatic.  Eyes: EOM are normal. No scleral icterus.  Neck: No thyromegaly present.  Cardiovascular: Normal rate, regular rhythm and normal heart sounds.  No murmur heard. Pulmonary/Chest: Effort normal and breath sounds normal. No respiratory distress. She has no wheezes.  Abdominal: Soft. Bowel sounds are normal. She exhibits no distension.  Musculoskeletal: Normal range of motion. She exhibits no edema.  Neurological: She is alert. She exhibits normal muscle tone.  Skin: Skin is warm and dry. She is not diaphoretic. No pallor.  Psychiatric: Her behavior is normal. Judgment and thought content normal. Her mood appears anxious (slightly).       Assessment & Plan:   Problem List Items Addressed This Visit      Respiratory   COPD (chronic obstructive pulmonary disease) ([place]) (Chronic)    Managed by pulm; stable      Relevant Medications   Triamcinolone Acetonide (NASACORT ALLERGY 24HR NA)     Endocrine   Impaired fasting glucose - Primary (Chronic)    Check A1c and fasting glucose when she returns; healthy diet      Relevant Orders   Hemoglobin A1c     Other   Severe needle phobia    Handles alcohol fine, will use medium dose benzo prior to procedures and blood draws and dentist appointments; explained can be addictive, just for appointments; let me know of red flags      Relevant Medications   ALPRAZolam (XANAX) 0.5 MG tablet   Medication monitoring encounter    Check liver and kidney function      Relevant Orders   COMPLETE METABOLIC PANEL WITH GFR   Hyperlipidemia (Chronic)    Check lipids when she returns (will take benzo prior to lab draw); limit saturated fats      Relevant Orders   Lipid panel   Abnormal liver function tests    Check LFTs when she returns      Relevant Orders   COMPLETE METABOLIC PANEL WITH GFR    Other Visit Diagnoses    Needs flu shot       Relevant Orders   Flu Vaccine QUAD 6+ mos PF IM (Fluarix Quad PF) (Completed)         Follow up plan: Return in about 4 weeks (around 05/09/2017) for follow-up visit with Dr. Sanda Klein just for  blood pressure.  An after-visit summary was printed and given to the patient at [facility].  Please see the patient instructions which may contain other information and recommendations beyond what is mentioned above in the assessment and plan.  Meds ordered this encounter  Medications  . ALPRAZolam (XANAX) 0.5 MG tablet    Sig: One or two tablets by mouth one hour prior to procedure, dentist visit, lab draw; no alcohol; do not drive for six hours    Dispense:  6 tablet    Refill:  2    Orders Placed This Encounter  Procedures  . Flu Vaccine QUAD 6+ mos PF IM (Fluarix Quad PF)  . COMPLETE METABOLIC PANEL WITH GFR  . Hemoglobin A1c  . Lipid panel

## 2017-04-11 NOTE — Patient Instructions (Addendum)
Please do take the new medicine for anxiety prior to lab draws and dentist appointments, etc. Return for fasting labs at your convenience Do not drive for six hours after taking this medicine; always have someone drive for you Try to follow the DASH guidelines (DASH stands for Dietary Approaches to Stop Hypertension). Try to limit the sodium in your diet to no more than 1,500mg  of sodium per day. Certainly try to not exceed 2,000 mg per day at the very most. Do not add salt when cooking or at the table.  Check the sodium amount on labels when shopping, and choose items lower in sodium when given a choice. Avoid or limit foods that already contain a lot of sodium. Eat a diet rich in fruits and vegetables and whole grains, and try to lose weight if overweight or obese Monitor your blood pressure daily over the next two weeks Please call us in two weeks with your readings Please call sooner if your pressure remains over 140/90   DASH Eating Plan DASH stands for "Dietary Approaches to Stop Hypertension." The DASH eating plan is a healthy eating plan that has been shown to reduce high blood pressure (hypertension). It may also reduce your risk for type 2 diabetes, heart disease, and stroke. The DASH eating plan may also help with weight loss. What are tips for following this plan? General guidelines  Avoid eating more than 2,300 mg (milligrams) of salt (sodium) a day. If you have hypertension, you may need to reduce your sodium intake to 1,500 mg a day.  Limit alcohol intake to no more than 1 drink a day for nonpregnant women and 2 drinks a day for men. One drink equals 12 oz of beer, 5 oz of wine, or 1 oz of hard liquor.  Work with your health care provider to maintain a healthy body weight or to lose weight. Ask what an ideal weight is for you.  Get at least 30 minutes of exercise that causes your heart to beat faster (aerobic exercise) most days of the week. Activities may include walking,  swimming, or biking.  Work with your health care provider or diet and nutrition specialist (dietitian) to adjust your eating plan to your individual calorie needs. Reading food labels  Check food labels for the amount of sodium per serving. Choose foods with less than 5 percent of the Daily Value of sodium. Generally, foods with less than 300 mg of sodium per serving fit into this eating plan.  To find whole grains, look for the word "whole" as the first word in the ingredient list. Shopping  Buy products labeled as "low-sodium" or "no salt added."  Buy fresh foods. Avoid canned foods and premade or frozen meals. Cooking  Avoid adding salt when cooking. Use salt-free seasonings or herbs instead of table salt or sea salt. Check with your health care provider or pharmacist before using salt substitutes.  Do not fry foods. Cook foods using healthy methods such as baking, boiling, grilling, and broiling instead.  Cook with heart-healthy oils, such as olive, canola, soybean, or sunflower oil. Meal planning   Eat a balanced diet that includes: ? 5 or more servings of fruits and vegetables each day. At each meal, try to fill half of your plate with fruits and vegetables. ? Up to 6-8 servings of whole grains each day. ? Less than 6 oz of lean meat, poultry, or fish each day. A 3-oz serving of meat is about the same size as a deck  of cards. One egg equals 1 oz. ? 2 servings of low-fat dairy each day. ? A serving of nuts, seeds, or beans 5 times each week. ? Heart-healthy fats. Healthy fats called Omega-3 fatty acids are found in foods such as flaxseeds and coldwater fish, like sardines, salmon, and mackerel.  Limit how much you eat of the following: ? Canned or prepackaged foods. ? Food that is high in trans fat, such as fried foods. ? Food that is high in saturated fat, such as fatty meat. ? Sweets, desserts, sugary drinks, and other foods with added sugar. ? Full-fat dairy  products.  Do not salt foods before eating.  Try to eat at least 2 vegetarian meals each week.  Eat more home-cooked food and less restaurant, buffet, and fast food.  When eating at a restaurant, ask that your food be prepared with less salt or no salt, if possible. What foods are recommended? The items listed may not be a complete list. Talk with your dietitian about what dietary choices are best for you. Grains Whole-grain or whole-wheat bread. Whole-grain or whole-wheat pasta. Brown rice. [place]. Bulgur. Whole-grain and low-sodium cereals. Pita bread. Low-fat, low-sodium crackers. Whole-wheat flour tortillas. Vegetables Fresh or frozen vegetables (raw, steamed, roasted, or grilled). Low-sodium or reduced-sodium tomato and vegetable juice. Low-sodium or reduced-sodium tomato sauce and tomato paste. Low-sodium or reduced-sodium canned vegetables. Fruits All fresh, dried, or frozen fruit. Canned fruit in natural juice (without added sugar). Meat and other protein foods Skinless chicken or [place]. Ground chicken or [place]. Pork with fat trimmed off. Fish and seafood. Egg whites. Dried beans, peas, or lentils. Unsalted nuts, nut butters, and seeds. Unsalted canned beans. Lean cuts of beef with fat trimmed off. Low-sodium, lean deli meat. Dairy Low-fat (1%) or fat-free (skim) milk. Fat-free, low-fat, or reduced-fat cheeses. Nonfat, low-sodium ricotta or cottage cheese. Low-fat or nonfat yogurt. Low-fat, low-sodium cheese. Fats and oils Soft margarine without trans fats. Vegetable oil. Low-fat, reduced-fat, or light mayonnaise and salad dressings (reduced-sodium). Canola, safflower, olive, soybean, and sunflower oils. Avocado. Seasoning and other foods Herbs. Spices. Seasoning mixes without salt. Unsalted popcorn and pretzels. Fat-free sweets. What foods are not recommended? The items listed may not be a complete list. Talk with your dietitian about what dietary choices are best for  you. Grains Baked goods made with fat, such as croissants, muffins, or some breads. Dry pasta or rice meal packs. Vegetables Creamed or fried vegetables. Vegetables in a cheese sauce. Regular canned vegetables (not low-sodium or reduced-sodium). Regular canned tomato sauce and paste (not low-sodium or reduced-sodium). Regular tomato and vegetable juice (not low-sodium or reduced-sodium). Angie Fava. Olives. Fruits Canned fruit in a light or heavy syrup. Fried fruit. Fruit in cream or butter sauce. Meat and other protein foods Fatty cuts of meat. Ribs. Fried meat. Berniece Salines. Sausage. Bologna and other processed lunch meats. Salami. Fatback. Hotdogs. Bratwurst. Salted nuts and seeds. Canned beans with added salt. Canned or smoked fish. Whole eggs or egg yolks. Chicken or [place] with skin. Dairy Whole or 2% milk, cream, and half-and-half. Whole or full-fat cream cheese. Whole-fat or sweetened yogurt. Full-fat cheese. Nondairy creamers. Whipped toppings. Processed cheese and cheese spreads. Fats and oils Butter. Stick margarine. Lard. Shortening. Ghee. Bacon fat. Tropical oils, such as coconut, palm kernel, or palm oil. Seasoning and other foods Salted popcorn and pretzels. Onion salt, garlic salt, seasoned salt, table salt, and sea salt. Worcestershire sauce. Tartar sauce. Barbecue sauce. Teriyaki sauce. Soy sauce, including reduced-sodium. Steak sauce. Canned and  packaged gravies. Fish sauce. Oyster sauce. Cocktail sauce. Horseradish that you find on the shelf. Ketchup. Mustard. Meat flavorings and tenderizers. Bouillon cubes. Hot sauce and Tabasco sauce. Premade or packaged marinades. Premade or packaged taco seasonings. Relishes. Regular salad dressings. Where to find more information:  National Heart, Lung, and [place]: https://wilson-eaton.com/  American Heart Association: www.heart.org Summary  The DASH eating plan is a healthy eating plan that has been shown to reduce high blood pressure  (hypertension). It may also reduce your risk for type 2 diabetes, heart disease, and stroke.  With the DASH eating plan, you should limit salt (sodium) intake to 2,300 mg a day. If you have hypertension, you may need to reduce your sodium intake to 1,500 mg a day.  When on the DASH eating plan, aim to eat more fresh fruits and vegetables, whole grains, lean proteins, low-fat dairy, and heart-healthy fats.  Work with your health care provider or diet and nutrition specialist (dietitian) to adjust your eating plan to your individual calorie needs. This information is not intended to replace advice given to you by your health care provider. Make sure you discuss any questions you have with your health care provider. Document Released: 01/25/2011 Document Revised: 01/30/2016 Document Reviewed: 01/30/2016 Elsevier Interactive Patient Education  [employer].

## 2017-04-11 NOTE — Assessment & Plan Note (Signed)
Handles alcohol fine, will use medium dose benzo prior to procedures and blood draws and dentist appointments; explained can be addictive, just for appointments; let me know of red flags

## 2017-04-11 NOTE — Assessment & Plan Note (Signed)
Check A1c and fasting glucose when she returns; healthy diet

## 2017-04-19 DIAGNOSIS — J449 Chronic obstructive pulmonary disease, unspecified: Secondary | ICD-10-CM | POA: Diagnosis not present

## 2017-04-19 NOTE — Assessment & Plan Note (Signed)
Check lipids when she returns (will take benzo prior to lab draw); limit saturated fats

## 2017-04-19 NOTE — Assessment & Plan Note (Signed)
Check liver and kidney function 

## 2017-04-19 NOTE — Assessment & Plan Note (Signed)
Check LFTs when she returns

## 2017-04-19 NOTE — Assessment & Plan Note (Signed)
Managed by pulm; stable

## 2017-04-23 ENCOUNTER — Other Ambulatory Visit: Payer: Self-pay | Admitting: Family Medicine

## 2017-04-23 DIAGNOSIS — R7301 Impaired fasting glucose: Secondary | ICD-10-CM | POA: Diagnosis not present

## 2017-04-23 DIAGNOSIS — E782 Mixed hyperlipidemia: Secondary | ICD-10-CM

## 2017-04-23 DIAGNOSIS — Z5181 Encounter for therapeutic drug level monitoring: Secondary | ICD-10-CM

## 2017-04-23 DIAGNOSIS — R945 Abnormal results of liver function studies: Secondary | ICD-10-CM | POA: Diagnosis not present

## 2017-04-23 MED ORDER — ATORVASTATIN CALCIUM 10 MG PO TABS: 10.0000 mg | ORAL_TABLET | Freq: Every day | ORAL | 1 refills | Status: DC

## 2017-04-23 NOTE — Progress Notes (Signed)
Start low dose statin Recheck lipids and hepatic function panel (staff to order), to be done in 6 weeks

## 2017-04-24 ENCOUNTER — Other Ambulatory Visit: Payer: Self-pay

## 2017-04-24 LAB — COMPLETE METABOLIC PANEL WITH GFR
AG Ratio: 1.8 (calc) (ref 1.0–2.5)
ALBUMIN MSPROF: 4.8 g/dL (ref 3.6–5.1)
ALT: 32 U/L — ABNORMAL HIGH (ref 6–29)
AST: 40 U/L — ABNORMAL HIGH (ref 10–35)
Alkaline phosphatase (APISO): 80 U/L (ref 33–130)
BUN: 21 mg/dL (ref 7–25)
CALCIUM: 10.5 mg/dL — AB (ref 8.6–10.4)
CO2: 29 mmol/L (ref 20–32)
CREATININE: 0.87 mg/dL (ref 0.50–0.99)
Chloride: 99 mmol/L (ref 98–110)
GFR, Est African American: 83 mL/min/{1.73_m2} (ref >=60)
GFR, Est Non African American: 72 mL/min/{1.73_m2} (ref >=60)
GLOBULIN: 2.7 g/dL (ref 1.9–3.7)
Glucose, Bld: 111 mg/dL — ABNORMAL HIGH (ref 65–99)
Potassium: 3.6 mmol/L (ref 3.5–5.3)
SODIUM: 139 mmol/L (ref 135–146)
Total Bilirubin: 0.7 mg/dL (ref 0.2–1.2)
Total Protein: 7.5 g/dL (ref 6.1–8.1)

## 2017-04-24 LAB — LIPID PANEL
CHOL/HDL RATIO: 4 (calc) (ref <5.0)
CHOLESTEROL: 255 mg/dL — AB (ref <200)
HDL: 63 mg/dL (ref >50)
LDL Cholesterol (Calc): 159 mg/dL (calc) — ABNORMAL HIGH
NON-HDL CHOLESTEROL (CALC): 192 mg/dL — AB (ref <130)
TRIGLYCERIDES: 178 mg/dL — AB (ref <150)

## 2017-04-24 LAB — HEMOGLOBIN A1C
Hgb A1c MFr Bld: 5.9 % of total Hgb — ABNORMAL HIGH (ref <5.7)
Mean Plasma Glucose: 123 (calc)
eAG (mmol/L): 6.8 (calc)

## 2017-04-24 NOTE — Addendum Note (Signed)
Addended by: Docia Furl on: 04/24/2017 10:16 AM   Modules accepted: Orders

## 2017-04-25 MED ORDER — POTASSIUM CHLORIDE ER 10 MEQ PO TBCR: 10.0000 meq | EXTENDED_RELEASE_TABLET | Freq: Every day | ORAL | 1 refills | Status: DC

## 2017-04-25 NOTE — Telephone Encounter (Signed)
Normal potassium this week Rx approved

## 2017-05-09 ENCOUNTER — Ambulatory Visit (INDEPENDENT_AMBULATORY_CARE_PROVIDER_SITE_OTHER): Payer: Medicare HMO | Admitting: Family Medicine

## 2017-05-09 ENCOUNTER — Encounter: Payer: Self-pay | Admitting: Family Medicine

## 2017-05-09 VITALS — BP 138/82 | HR 93 | Temp 98.3°F | Ht 66.0 in | Wt 177.1 lb

## 2017-05-09 DIAGNOSIS — J3089 Other allergic rhinitis: Secondary | ICD-10-CM

## 2017-05-09 DIAGNOSIS — J432 Centrilobular emphysema: Secondary | ICD-10-CM

## 2017-05-09 DIAGNOSIS — G4700 Insomnia, unspecified: Secondary | ICD-10-CM

## 2017-05-09 DIAGNOSIS — J309 Allergic rhinitis, unspecified: Secondary | ICD-10-CM | POA: Insufficient documentation

## 2017-05-09 DIAGNOSIS — R232 Flushing: Secondary | ICD-10-CM | POA: Diagnosis not present

## 2017-05-09 DIAGNOSIS — E663 Overweight: Secondary | ICD-10-CM | POA: Diagnosis not present

## 2017-05-09 MED ORDER — SYMBICORT 160-4.5 MCG/ACT IN AERO: 2.0000 | INHALATION_SPRAY | Freq: Two times a day (BID) | RESPIRATORY_TRACT | 11 refills | Status: DC

## 2017-05-09 MED ORDER — MECLIZINE HCL 25 MG PO TABS: 25.0000 mg | ORAL_TABLET | Freq: Three times a day (TID) | ORAL | 2 refills | Status: DC | PRN

## 2017-05-09 MED ORDER — CLONIDINE HCL 0.1 MG PO TABS: 0.1000 mg | ORAL_TABLET | Freq: Every day | ORAL | 5 refills | Status: DC

## 2017-05-09 MED ORDER — ALBUTEROL SULFATE HFA 108 (90 BASE) MCG/ACT IN AERS: 2.0000 | INHALATION_SPRAY | Freq: Four times a day (QID) | RESPIRATORY_TRACT | 3 refills | Status: DC | PRN

## 2017-05-09 MED ORDER — FEXOFENADINE HCL 180 MG PO TABS: 180.0000 mg | ORAL_TABLET | Freq: Every day | ORAL | 11 refills | Status: DC | PRN

## 2017-05-09 MED ORDER — TRIAMCINOLONE ACETONIDE 55 MCG/ACT NA AERO
2.0000 | INHALATION_SPRAY | Freq: Every day | NASAL | 11 refills | Status: DC
Start: 2017-05-09 — End: 2018-04-17

## 2017-05-09 NOTE — Assessment & Plan Note (Signed)
Losing weight, eating better, intentional; try to lose another 10 pounds over six months

## 2017-05-09 NOTE — Assessment & Plan Note (Signed)
Stop the current allergy pill and start new; continue nasal spray and eye drops

## 2017-05-09 NOTE — Patient Instructions (Addendum)
Avoid salt substitutes; if you need to use salt, use a little bit of the real thing Try to follow the DASH guidelines (DASH stands for Dietary Approaches to Stop Hypertension). Try to limit the sodium in your diet to no more than 1,500mg  of sodium per day. Certainly try to not exceed 2,000 mg per day at the very most. Do not add salt when cooking or at the table.  Check the sodium amount on labels when shopping, and choose items lower in sodium when given a choice. Avoid or limit foods that already contain a lot of sodium. Eat a diet rich in fruits and vegetables and whole grains, and try to lose weight if overweight or obese Please return for fasting labs only on or after June 05, 2017 Try to limit saturated fats in your diet (bologna, hot dogs, barbeque, cheeseburgers, hamburgers, steak, bacon, sausage, cheese, etc.) and get more fresh fruits, vegetables, and whole grains

## 2017-05-09 NOTE — Assessment & Plan Note (Signed)
Continue medicines.

## 2017-05-09 NOTE — Assessment & Plan Note (Signed)
Encouraged good sleep hygiene

## 2017-05-09 NOTE — Progress Notes (Signed)
BP 138/82 (BP Location: Left Arm, Patient Position: Sitting, Cuff Size: Large)   Pulse 93   Temp 98.3 F (36.8 C) (Oral)   Ht 5\' 6"  (1.676 m)   Wt 177 lb 1.6 oz (80.3 kg)   LMP 10/27/2015   SpO2 94%   BMI 28.58 kg/m    Subjective:    Patient ID: Andrea Bell, female    DOB: 27-Nov-1955, 62 y.o.   MRN: [id number]  HPI: Andrea Bell is a 62 y.o. female  Chief Complaint  Patient presents with  . Dizziness    Pt states that she has had vertigo in the pass, pt states that she has been dizzy the last several days   . Follow-up    HPI She has been hearing about aspirin and asked about it She has allergic rhinitis, using cetirizine, not helping any more; watery eyes, runny nose; year round Breathing has been up and down, up and down; has to sit down with activities at times; using symbicort every day; using rescue inhaler BID HTN: not checking; getting better on the salt, using less High cholesterol; taking the new cholesterol medicine Lab Results  Component Value Date   CHOL 255 (H) 04/23/2017   HDL 63 04/23/2017   LDLCALC 159 (H) 04/23/2017   TRIG 178 (H) 04/23/2017   CHOLHDL 4.0 04/23/2017   Vertigo; asks if she can use meclizine; some dry mouth Lab Results  Component Value Date   HGBA1C 5.9 (H) 04/23/2017  prediabetes now; diabetes does run in the family  Insomnia because of hot flashes  Depression screen [facility] 2/9 05/09/2017 04/11/2017 10/09/2016 04/09/2016 11/24/2015  Decreased Interest 0 0 0 0 0  Down, Depressed, Hopeless 0 0 0 0 1  PHQ - 2 Score 0 0 0 0 1    Relevant past medical, surgical, family and social history reviewed Past Medical History:  Diagnosis Date  . Anemia   . Chronic back pain   . COPD (chronic obstructive pulmonary disease) (HCC)    2l home oxygen  . Dyspnea   . GERD (gastroesophageal reflux disease)   . HTN (hypertension)   . Insomnia   . Osteoarthritis   . Osteoporosis   . Tachycardia    Past Surgical History:  Procedure Laterality  Date  . CYST REMOVAL TRUNK    . CYSTECTOMY     back and side of head  . CYSTOSCOPY N/A 12/13/2015   Procedure: CYSTOSCOPY;  Surgeon: Malachy Mood, MD;  Location: [facility] ORS;  Service: Gynecology;  Laterality: N/A;  . LAPAROSCOPIC BILATERAL SALPINGO OOPHERECTOMY Bilateral 12/13/2015   Procedure: LAPAROSCOPIC BILATERAL SALPINGO OOPHORECTOMY;  Surgeon: Malachy Mood, MD;  Location: [facility] ORS;  Service: Gynecology;  Laterality: Bilateral;  . LAPAROSCOPIC HYSTERECTOMY N/A 12/13/2015   Procedure: HYSTERECTOMY TOTAL LAPAROSCOPIC;  Surgeon: Malachy Mood, MD;  Location: [facility] ORS;  Service: Gynecology;  Laterality: N/A;  . tear duct surgery     bilateral  . TUBAL LIGATION     Family History  Problem Relation Age of Onset  . Hypertension Mother   . Diabetes Mellitus II Mother   . Dementia Mother   . Diabetes Mother   . Cancer - Other Father        brain tumor  . Cancer Father        brain tumor  . Breast cancer Maternal Aunt 57  . Cancer Maternal Aunt        ovarian  . Breast cancer Maternal Grandmother 67  . Cancer Maternal Grandmother  breast  . Diabetes Brother   . Hypertension Brother   . COPD Brother   . Cancer Maternal Grandfather        stomach  . Heart disease Paternal Grandmother   . Stroke Paternal Grandfather    Social History   Tobacco Use  . Smoking status: Former Smoker    Packs/day: 0.50    Years: 15.00    Pack years: 7.50    Types: Cigarettes    Last attempt to quit: 04/25/2011    Years since quitting: 6.0  . Smokeless tobacco: Never Used  Substance Use Topics  . Alcohol use: Yes    Alcohol/week: 0.0 oz    Comment: occasional drinks  . Drug use: No    Interim medical history since last visit reviewed. Allergies and medications reviewed  Review of Systems Per HPI unless specifically indicated above     Objective:    BP 138/82 (BP Location: Left Arm, Patient Position: Sitting, Cuff Size: Large)   Pulse 93   Temp 98.3 F (36.8 C)  (Oral)   Ht 5\' 6"  (1.676 m)   Wt 177 lb 1.6 oz (80.3 kg)   LMP 10/27/2015   SpO2 94%   BMI 28.58 kg/m   Wt Readings from Last 3 Encounters:  05/09/17 177 lb 1.6 oz (80.3 kg)  04/11/17 178 lb (80.7 kg)  10/09/16 181 lb 1.6 oz (82.1 kg)    Physical Exam  Constitutional: She appears well-developed and well-nourished. No distress.  HENT:  Head: Normocephalic and atraumatic.  Eyes: EOM are normal. No scleral icterus.  Neck: No thyromegaly present.  Cardiovascular: Normal rate, regular rhythm and normal heart sounds.  No murmur heard. Pulmonary/Chest: Effort normal and breath sounds normal. No respiratory distress. She has no wheezes.  Abdominal: Soft. Bowel sounds are normal. She exhibits no distension.  Musculoskeletal: Normal range of motion. She exhibits no edema.  Neurological: She is alert. She exhibits normal muscle tone.  Skin: Skin is warm and dry. She is not diaphoretic. No pallor.  Psychiatric: She has a normal mood and affect. Her behavior is normal. Judgment and thought content normal.    Results for orders placed or performed in visit on 04/11/17  COMPLETE METABOLIC PANEL WITH GFR  Result Value Ref Range   Glucose, Bld 111 (H) 65 - 99 mg/dL   BUN 21 7 - 25 mg/dL   Creat 0.87 0.50 - 0.99 mg/dL   GFR, Est Non African American 72 > OR = 60 mL/min/1.34m2   GFR, Est African American 83 > OR = 60 mL/min/1.70m2   BUN/Creatinine Ratio NOT APPLICABLE 6 - 22 (calc)   Sodium 139 135 - 146 mmol/L   Potassium 3.6 3.5 - 5.3 mmol/L   Chloride 99 98 - 110 mmol/L   CO2 29 20 - 32 mmol/L   Calcium 10.5 (H) 8.6 - 10.4 mg/dL   Total Protein 7.5 6.1 - 8.1 g/dL   Albumin 4.8 3.6 - 5.1 g/dL   Globulin 2.7 1.9 - 3.7 g/dL (calc)   AG Ratio 1.8 1.0 - 2.5 (calc)   Total Bilirubin 0.7 0.2 - 1.2 mg/dL   Alkaline phosphatase (APISO) 80 33 - 130 U/L   AST 40 (H) 10 - 35 U/L   ALT 32 (H) 6 - 29 U/L  Hemoglobin A1c  Result Value Ref Range   Hgb A1c MFr Bld 5.9 (H) <5.7 % of total Hgb    Mean Plasma Glucose 123 (calc)   eAG (mmol/L) 6.8 (calc)  Lipid panel  Result Value Ref Range   Cholesterol 255 (H) <200 mg/dL   HDL 63 >50 mg/dL   Triglycerides 178 (H) <150 mg/dL   LDL Cholesterol (Calc) 159 (H) mg/dL (calc)   Total CHOL/HDL Ratio 4.0 <5.0 (calc)   Non-HDL Cholesterol (Calc) 192 (H) <130 mg/dL (calc)      Assessment & Plan:   Problem List Items Addressed This Visit      Respiratory   COPD (chronic obstructive pulmonary disease) (HCC) (Chronic)    Continue medicines      Relevant Medications   triamcinolone (NASACORT) 55 MCG/ACT AERO nasal inhaler   SYMBICORT 160-4.5 MCG/ACT inhaler   fexofenadine (ALLEGRA ALLERGY) 180 MG tablet   albuterol (PROVENTIL HFA;VENTOLIN HFA) 108 (90 Base) MCG/ACT inhaler   Allergic rhinitis - Primary    Stop the current allergy pill and start new; continue nasal spray and eye drops        Other   Overweight (BMI 25.0-29.9)    Losing weight, eating better, intentional; try to lose another 10 pounds over six months      Insomnia    Encouraged good sleep hygiene       Other Visit Diagnoses    Hot flashes       add clonidine at night   Relevant Medications   cloNIDine (CATAPRES) 0.1 MG tablet       Follow up plan: Return in about 5 months (around 09/24/2017) for twenty minute follow-up with fasting labs.  An after-visit summary was printed and given to the patient at [place].  Please see the patient instructions which may contain other information and recommendations beyond what is mentioned above in the assessment and plan.  Meds ordered this encounter  Medications  . triamcinolone (NASACORT) 55 MCG/ACT AERO nasal inhaler    Sig: Place 2 sprays into the nose daily.    Dispense:  1 Inhaler    Refill:  11  . meclizine (ANTIVERT) 25 MG tablet    Sig: Take 1 tablet (25 mg total) by mouth 3 (three) times daily as needed for dizziness.    Dispense:  30 tablet    Refill:  2  . SYMBICORT 160-4.5 MCG/ACT inhaler     Sig: Inhale 2 puffs into the lungs 2 (two) times daily.    Dispense:  1 Inhaler    Refill:  11  . cloNIDine (CATAPRES) 0.1 MG tablet    Sig: Take 1 tablet (0.1 mg total) by mouth at bedtime. For hot flashes    Dispense:  30 tablet    Refill:  5  . fexofenadine (ALLEGRA ALLERGY) 180 MG tablet    Sig: Take 1 tablet (180 mg total) by mouth daily as needed for allergies or rhinitis.    Dispense:  30 tablet    Refill:  11  . albuterol (PROVENTIL HFA;VENTOLIN HFA) 108 (90 Base) MCG/ACT inhaler    Sig: Inhale 2 puffs into the lungs every 6 (six) hours as needed for wheezing or shortness of breath.    Dispense:  1 Inhaler    Refill:  3    No orders of the defined types were placed in this encounter.

## 2017-05-15 IMAGING — MG MM DIGITAL SCREENING BILAT W/ CAD
4 series · 4 of 4 positions shown · non-contrast
Comparison: Previous exam(s).

CLINICAL DATA: Screening.

EXAM:
DIGITAL SCREENING BILATERAL MAMMOGRAM WITH CAD

[R MLO]
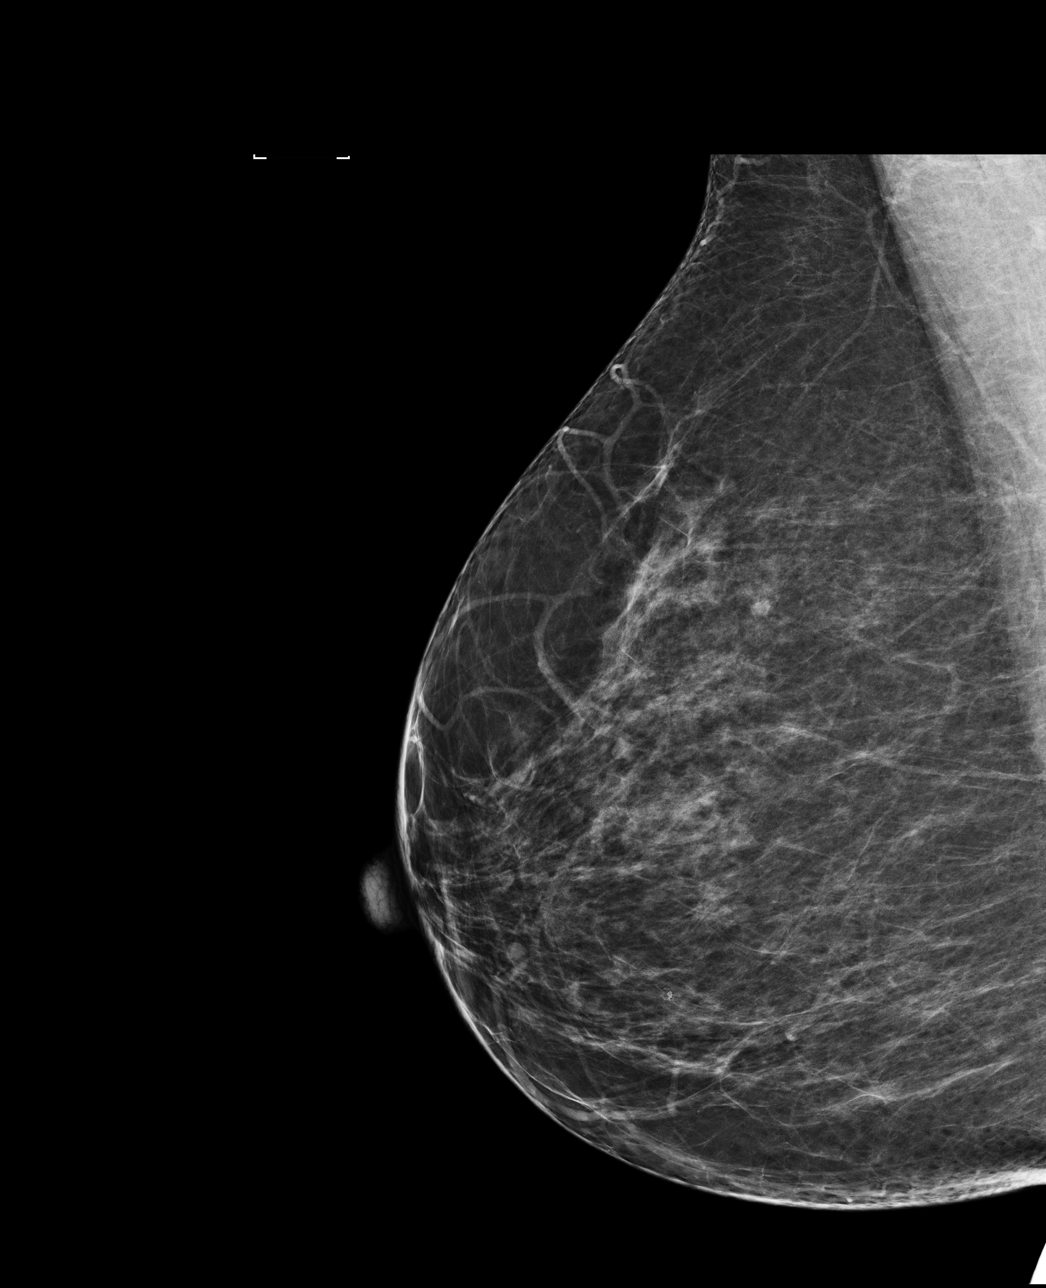

[L CC]
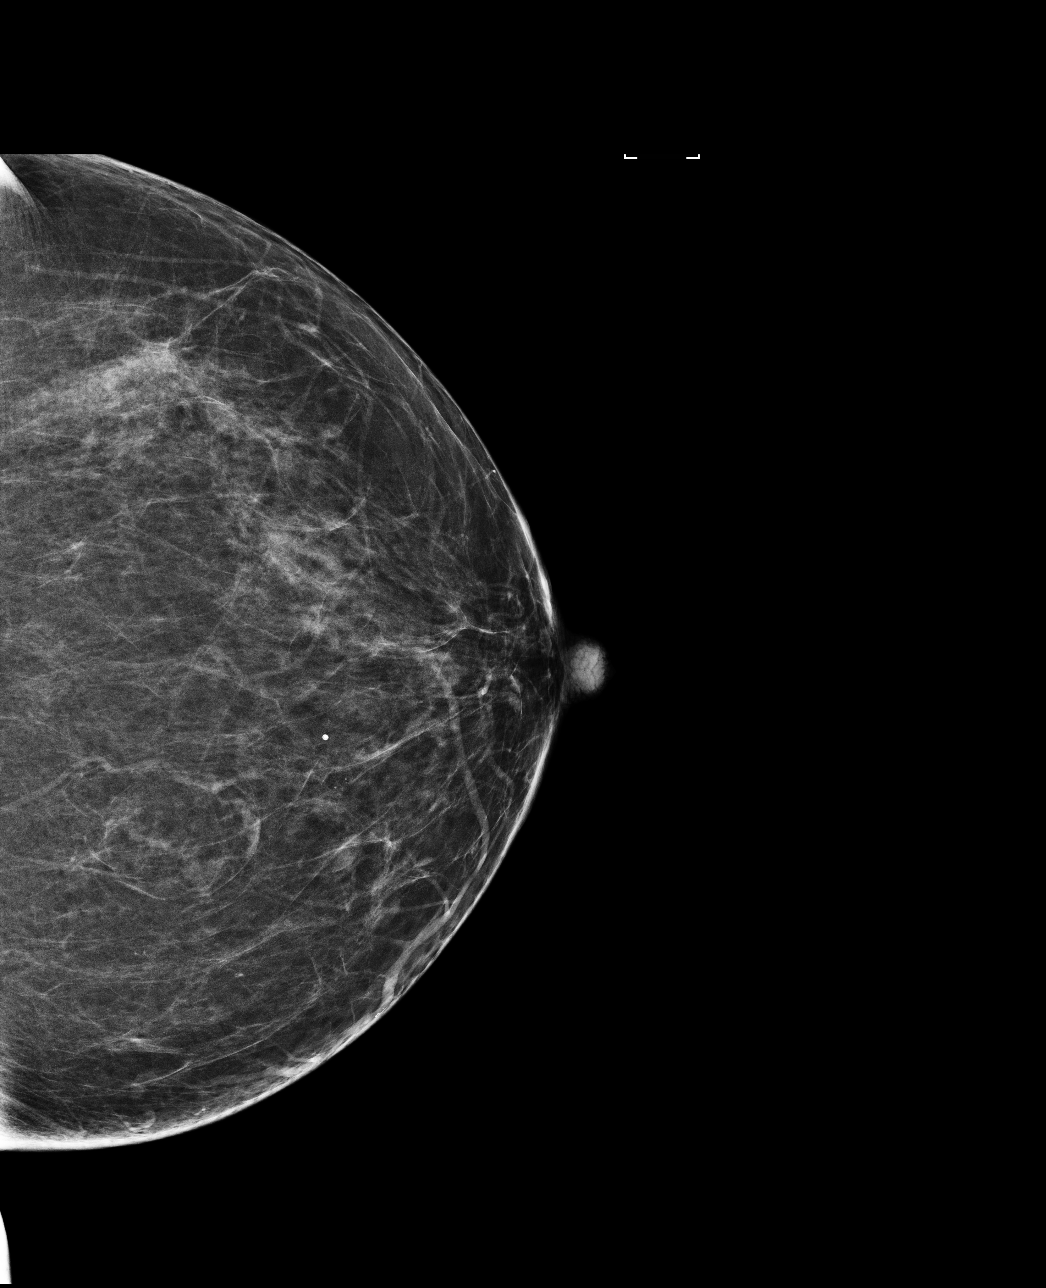

[L MLO]
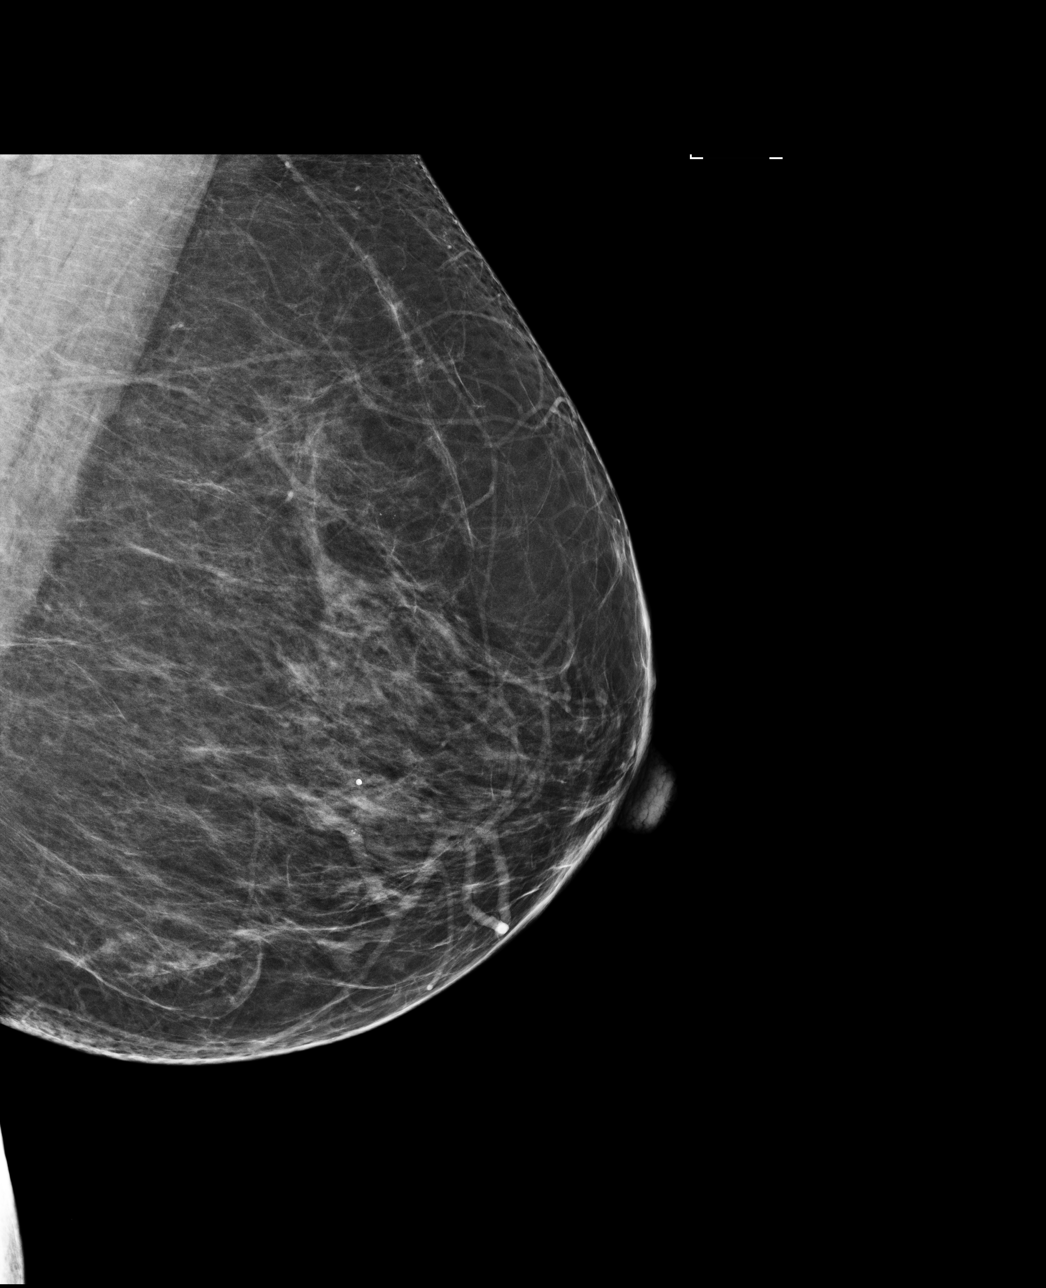

[R CC]
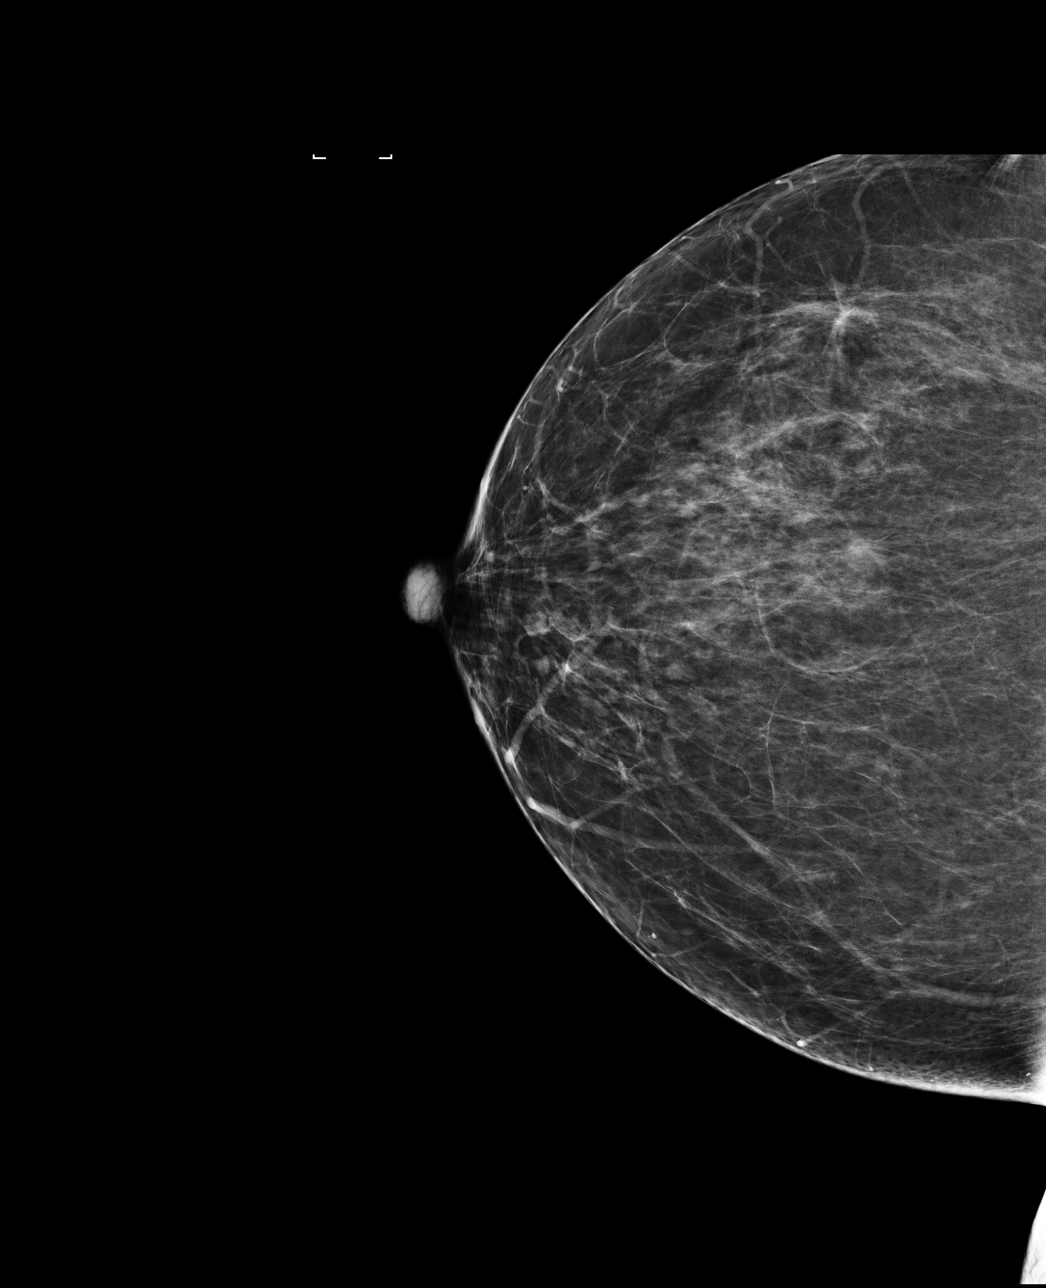

[4 of 4 positions shown; findings below may reference images not displayed]

ACR Breast Density Category b: There are scattered areas of
fibroglandular density.
FINDINGS: There are no findings suspicious for malignancy. Images were
processed with CAD.
IMPRESSION: No mammographic evidence of malignancy. A result letter of this
screening mammogram will be mailed directly to the patient.

RECOMMENDATION:
Screening mammogram in one year. (Code:AS-G-LCT)

BI-RADS CATEGORY  1: Negative.

## 2017-05-20 DIAGNOSIS — J449 Chronic obstructive pulmonary disease, unspecified: Secondary | ICD-10-CM | POA: Diagnosis not present

## 2017-05-21 ENCOUNTER — Other Ambulatory Visit: Payer: Self-pay | Admitting: Family Medicine

## 2017-05-21 MED ORDER — ALBUTEROL SULFATE HFA 108 (90 BASE) MCG/ACT IN AERS: 2.0000 | INHALATION_SPRAY | Freq: Four times a day (QID) | RESPIRATORY_TRACT | 3 refills | Status: DC | PRN

## 2017-05-21 NOTE — Progress Notes (Signed)
[employer] database says Ventolin is covered; that's what I sent in March, but I'll send it again

## 2017-06-18 ENCOUNTER — Other Ambulatory Visit: Payer: Self-pay | Admitting: Family Medicine

## 2017-06-18 NOTE — Telephone Encounter (Signed)
She needs to have labs drawn, I will send 30 days but please remind her to return, Dr. Sanda Klein already ordered the labs

## 2017-06-19 DIAGNOSIS — J449 Chronic obstructive pulmonary disease, unspecified: Secondary | ICD-10-CM | POA: Diagnosis not present

## 2017-06-19 NOTE — Telephone Encounter (Signed)
Patient notified to come back in 6 weeks to recheck blood work after starting Cholesterol medication.

## 2017-07-19 ENCOUNTER — Other Ambulatory Visit: Payer: Self-pay | Admitting: Pharmacist

## 2017-07-19 NOTE — Patient Outreach (Signed)
[facility]) Care Management  07/19/2017  Andrea Bell 21-Apr-1955 [id number]   Incoming call from Andrea Bell in response to the [facility] Medication Adherence Campaign. Speak with patient. HIPAA identifiers verified and verbal consent received.  Andrea Bell reports that she takes both her atorvastatin and lisinopril-hydrochlorothiazide each once daily as directed. Reports that she uses a weekly pillbox to organize the majority of her medications, which she takes in the morning, including her lisinopril-hydrochlorothiazide. However, reports that she currently takes her atorvastatin separately each night at bedtime and admits to occasionally falling asleep before taking this medication. Counsel patient about the importance of medication adherence to both medications. Also counsel patient that the atorvastatin, due to its long half-life, can be taken each morning with the rest of her medications, rather than at bedtime, to help to improve her adherence. Patient states that this change would help her to not miss doses.  Andrea Bell reports that she was just now refilling her pillbox for the coming week and noticed that she will need a refill of her atorvastatin, lisinopril-hydrochlorothiazide and her eye drops soon. States that she will call [facility] to request these refills today when we get off of the phone. Offer to make this phone call with the patient, but Andrea Bell reports that she feels comfortable making this call herself.  Andrea Bell reports that she has heard of the [facility] Over the Counter benefit before, but that she does not have the phone number. Provide patient with the phone number for this benefit.  Andrea Bell reports that she has been taking clonidine nightly at bedtime as directed to help with her hot flash symptoms. Patient reports that she felt like this was helping when she first started the medication, but does not feel like her symptoms are controlled as well  now. Patient reports that she is planning to follow up with her PCP about this at their next appointment. Advise patient that if she feels like this medication is no longer helping with her symptoms, to follow up with her PCP office sooner by calling in and asking to speak with a nurse in the office/leaving a message for her PCP. Andrea Bell states that she will make this call if she continues to feel the medication is not controlling her symptoms.  Andrea Bell denies any further medication questions/concerns at this time. Patient expresses appreciation for this phone call. Provide patient with my phone number. Will close pharmacy episode at this time.  Harlow Asa, PharmD, [place] Management [phone]

## 2017-07-20 DIAGNOSIS — J449 Chronic obstructive pulmonary disease, unspecified: Secondary | ICD-10-CM | POA: Diagnosis not present

## 2017-08-19 ENCOUNTER — Other Ambulatory Visit: Payer: Self-pay | Admitting: Family Medicine

## 2017-08-19 DIAGNOSIS — J449 Chronic obstructive pulmonary disease, unspecified: Secondary | ICD-10-CM | POA: Diagnosis not present

## 2017-08-19 NOTE — Telephone Encounter (Signed)
Refill request for BP med; please ask her how she is taking the lisinopril-hctz; a 6 month supply was written last July, so perhaps she is taking half a pill daily(?)  Please remind patient of the labs that were due in mid-April Ask her to come by for those are her earliest convenience Thank you

## 2017-08-20 NOTE — Telephone Encounter (Signed)
I tried to contact this patient to review Dr. Delight Ovens message but there was no answer. A message was left for her to give our office a call when she got the chance.   This patient called me right back and stated that she has not been taking her meds as directed. I asked her if she will be able to come in for labs and she said she will be in next week.

## 2017-08-21 MED ORDER — LISINOPRIL-HYDROCHLOROTHIAZIDE 10-12.5 MG PO TABS: 1.0000 | ORAL_TABLET | Freq: Every day | ORAL | 0 refills | Status: DC

## 2017-08-21 NOTE — Telephone Encounter (Signed)
30 day supply sent to local pharmacy

## 2017-09-11 ENCOUNTER — Other Ambulatory Visit: Payer: Self-pay

## 2017-09-11 DIAGNOSIS — E782 Mixed hyperlipidemia: Secondary | ICD-10-CM

## 2017-09-11 DIAGNOSIS — Z5181 Encounter for therapeutic drug level monitoring: Secondary | ICD-10-CM

## 2017-09-11 LAB — HEPATIC FUNCTION PANEL
AG Ratio: 1.8 (calc) (ref 1.0–2.5)
ALT: 46 U/L — ABNORMAL HIGH (ref 6–29)
AST: 50 U/L — ABNORMAL HIGH (ref 10–35)
Albumin: 5.1 g/dL (ref 3.6–5.1)
Alkaline phosphatase (APISO): 78 U/L (ref 33–130)
BILIRUBIN INDIRECT: 0.6 mg/dL (ref 0.2–1.2)
BILIRUBIN TOTAL: 0.8 mg/dL (ref 0.2–1.2)
Bilirubin, Direct: 0.2 mg/dL (ref 0.0–0.2)
GLOBULIN: 2.8 g/dL (ref 1.9–3.7)
TOTAL PROTEIN: 7.9 g/dL (ref 6.1–8.1)

## 2017-09-11 LAB — LIPID PANEL
Cholesterol: 188 mg/dL (ref <200)
HDL: 65 mg/dL (ref >50)
LDL Cholesterol (Calc): 100 mg/dL (calc) — ABNORMAL HIGH
Non-HDL Cholesterol (Calc): 123 mg/dL (calc) (ref <130)
TRIGLYCERIDES: 126 mg/dL (ref <150)
Total CHOL/HDL Ratio: 2.9 (calc) (ref <5.0)

## 2017-09-19 ENCOUNTER — Telehealth: Payer: Self-pay

## 2017-09-19 DIAGNOSIS — J449 Chronic obstructive pulmonary disease, unspecified: Secondary | ICD-10-CM | POA: Diagnosis not present

## 2017-09-19 NOTE — Telephone Encounter (Signed)
Overdue for AWV. Called pt to sched appt w/ [facility]. LVM requesting returned call. 

## 2017-09-20 NOTE — Telephone Encounter (Signed)
The patient has been sch for 10/10/17

## 2017-09-24 ENCOUNTER — Telehealth: Payer: Self-pay

## 2017-09-24 DIAGNOSIS — R945 Abnormal results of liver function studies: Principal | ICD-10-CM

## 2017-09-24 DIAGNOSIS — R7989 Other specified abnormal findings of blood chemistry: Secondary | ICD-10-CM

## 2017-09-24 NOTE — Telephone Encounter (Signed)
-----   Message from Arnetha Courser, MD sent at 09/23/2017  4:52 PM EDT ----- Please let the patient know that I'm very pleased with her response to the cholesterol medicine. She has brought her LDL down 59 points! However, her liver enzymes are still elevated. Ask about tylenol and/or alcohol intake and reduce both. Please ORDER a liver ultrasound (RUQ, dx: elevated LFTs) and schedule appt with me afterward to discuss. Thank you

## 2017-09-26 ENCOUNTER — Ambulatory Visit: Payer: Medicare HMO

## 2017-09-27 ENCOUNTER — Ambulatory Visit: Payer: Medicare HMO

## 2017-10-04 ENCOUNTER — Telehealth: Payer: Self-pay

## 2017-10-04 NOTE — Telephone Encounter (Signed)
Called pt to reschedule 10/10/17 AWV to an alternate day and time. LVM requesting returned call.

## 2017-10-10 ENCOUNTER — Ambulatory Visit: Payer: Medicare HMO

## 2017-10-10 ENCOUNTER — Ambulatory Visit (INDEPENDENT_AMBULATORY_CARE_PROVIDER_SITE_OTHER): Payer: Medicare HMO | Admitting: Family Medicine

## 2017-10-10 ENCOUNTER — Encounter: Payer: Self-pay | Admitting: Family Medicine

## 2017-10-10 VITALS — BP 148/98 | HR 73 | Temp 98.5°F | Ht 66.0 in | Wt 161.9 lb

## 2017-10-10 DIAGNOSIS — E782 Mixed hyperlipidemia: Secondary | ICD-10-CM | POA: Diagnosis not present

## 2017-10-10 DIAGNOSIS — H1013 Acute atopic conjunctivitis, bilateral: Secondary | ICD-10-CM

## 2017-10-10 DIAGNOSIS — Z1231 Encounter for screening mammogram for malignant neoplasm of breast: Secondary | ICD-10-CM | POA: Diagnosis not present

## 2017-10-10 DIAGNOSIS — R7989 Other specified abnormal findings of blood chemistry: Secondary | ICD-10-CM

## 2017-10-10 DIAGNOSIS — R945 Abnormal results of liver function studies: Secondary | ICD-10-CM

## 2017-10-10 DIAGNOSIS — Z9981 Dependence on supplemental oxygen: Secondary | ICD-10-CM | POA: Diagnosis not present

## 2017-10-10 DIAGNOSIS — Z1239 Encounter for other screening for malignant neoplasm of breast: Secondary | ICD-10-CM

## 2017-10-10 DIAGNOSIS — R7301 Impaired fasting glucose: Secondary | ICD-10-CM | POA: Diagnosis not present

## 2017-10-10 DIAGNOSIS — I10 Essential (primary) hypertension: Secondary | ICD-10-CM | POA: Diagnosis not present

## 2017-10-10 DIAGNOSIS — R000 Tachycardia, unspecified: Secondary | ICD-10-CM

## 2017-10-10 DIAGNOSIS — R0609 Other forms of dyspnea: Secondary | ICD-10-CM | POA: Diagnosis not present

## 2017-10-10 DIAGNOSIS — J432 Centrilobular emphysema: Secondary | ICD-10-CM | POA: Diagnosis not present

## 2017-10-10 DIAGNOSIS — G4733 Obstructive sleep apnea (adult) (pediatric): Secondary | ICD-10-CM | POA: Diagnosis not present

## 2017-10-10 MED ORDER — MONTELUKAST SODIUM 10 MG PO TABS: 10.0000 mg | ORAL_TABLET | Freq: Every day | ORAL | 11 refills | Status: DC

## 2017-10-10 MED ORDER — LORATADINE 10 MG PO TABS: 10.0000 mg | ORAL_TABLET | Freq: Every day | ORAL | 11 refills | Status: DC | PRN

## 2017-10-10 MED ORDER — LISINOPRIL-HYDROCHLOROTHIAZIDE 10-12.5 MG PO TABS: 1.0000 | ORAL_TABLET | Freq: Every day | ORAL | 1 refills | Status: DC

## 2017-10-10 MED ORDER — ATORVASTATIN CALCIUM 10 MG PO TABS: 10.0000 mg | ORAL_TABLET | Freq: Every day | ORAL | 1 refills | Status: DC

## 2017-10-10 MED ORDER — AZELASTINE HCL 0.05 % OP SOLN: 1.0000 [drp] | Freq: Two times a day (BID) | OPHTHALMIC | 12 refills | Status: DC

## 2017-10-10 MED ORDER — SYMBICORT 160-4.5 MCG/ACT IN AERO: 2.0000 | INHALATION_SPRAY | Freq: Two times a day (BID) | RESPIRATORY_TRACT | 3 refills | Status: DC

## 2017-10-10 NOTE — Assessment & Plan Note (Signed)
LDL came down nicely; avoid saturated fats; on low dose statin, refills provided

## 2017-10-10 NOTE — Assessment & Plan Note (Signed)
Uncontrolled; she did not take her medicines today; urged her to take medicines before appointments here; try DASH guidelines; return in one week for BP and pulse recheck

## 2017-10-10 NOTE — Progress Notes (Signed)
BP (!) 148/98   Pulse 73   Temp 98.5 F (36.9 C)   Ht 5\' 6"  (1.676 m)   Wt 161 lb 14.4 oz (73.4 kg)   LMP 10/27/2015   SpO2 95%   BMI 26.13 kg/m    Subjective:    Patient ID: Andrea Bell, female    DOB: Jul 29, 1955, 62 y.o.   MRN: [id number]  HPI: Andrea Bell is a 62 y.o. female  Chief Complaint  Patient presents with  . Follow-up  . Medication Refill    HPI Patient is here for f/u  Elevated liver enzymes; no tylenol; drinks two alcoholic drinks in a week; no abdominal pain; has liver US scheduled for tomorrow; no fam hx of liver trouble; no hx of tattoos; no IVDU; no travel to third world countries; no hx of hepatitis; viral hepatitis labs were negative in 2018  Hypertension: metoprolol 12.5mg  daily, lisinopril 10mg  and HCTZ 12.5mg  daily and clonidine 0.1mg  at bedtime. She has not taken her medicines today; no chest pain, no palpitations  COPD: rx Symbicort 2 puffs BID; Spiriva; heat and humidity affects her; she has A/C; stays indoors on really hot days  Alprazolam only if needed for labs and procedures  Allergic rhinitis and conjuntivitis  Prediabetes; keeps water with her all the time; blurred vision too; last A1c 5.9 in March  Last mammogram in [place]; 2018 fall  She is cutting back on what she eats; weight loss has all been intentional  Depression screen [facility] 2/9 10/10/2017 05/09/2017 04/11/2017 10/09/2016 04/09/2016  Decreased Interest 0 0 0 0 0  Down, Depressed, Hopeless 0 0 0 0 0  PHQ - 2 Score 0 0 0 0 0    Relevant past medical, surgical, family and social history reviewed Past Medical History:  Diagnosis Date  . Anemia   . Chronic back pain   . COPD (chronic obstructive pulmonary disease) (HCC)    2l home oxygen  . Dyspnea   . GERD (gastroesophageal reflux disease)   . HTN (hypertension)   . Insomnia   . Osteoarthritis   . Osteoporosis   . Tachycardia    Past Surgical History:  Procedure Laterality Date  . CYST REMOVAL TRUNK    . CYSTECTOMY      back and side of head  . CYSTOSCOPY N/A 12/13/2015   Procedure: CYSTOSCOPY;  Surgeon: Malachy Mood, MD;  Location: [facility] ORS;  Service: Gynecology;  Laterality: N/A;  . LAPAROSCOPIC BILATERAL SALPINGO OOPHERECTOMY Bilateral 12/13/2015   Procedure: LAPAROSCOPIC BILATERAL SALPINGO OOPHORECTOMY;  Surgeon: Malachy Mood, MD;  Location: [facility] ORS;  Service: Gynecology;  Laterality: Bilateral;  . LAPAROSCOPIC HYSTERECTOMY N/A 12/13/2015   Procedure: HYSTERECTOMY TOTAL LAPAROSCOPIC;  Surgeon: Malachy Mood, MD;  Location: [facility] ORS;  Service: Gynecology;  Laterality: N/A;  . tear duct surgery     bilateral  . TUBAL LIGATION     Family History  Problem Relation Age of Onset  . Hypertension Mother   . Diabetes Mellitus II Mother   . Dementia Mother   . Diabetes Mother   . Cancer - Other Father        brain tumor  . Cancer Father        brain tumor  . Breast cancer Maternal Aunt 57  . Cancer Maternal Aunt        ovarian  . Breast cancer Maternal Grandmother 33  . Cancer Maternal Grandmother        breast  . Diabetes Brother   . Hypertension Brother   .  COPD Brother   . Cancer Maternal Grandfather        stomach  . Heart disease Paternal Grandmother   . Stroke Paternal Grandfather    Social History   Tobacco Use  . Smoking status: Former Smoker    Packs/day: 0.50    Years: 15.00    Pack years: 7.50    Types: Cigarettes    Last attempt to quit: 04/25/2011    Years since quitting: 6.4  . Smokeless tobacco: Never Used  Substance Use Topics  . Alcohol use: Yes    Alcohol/week: 0.0 standard drinks    Comment: occasional drinks  . Drug use: No    Interim medical history since last visit reviewed. Allergies and medications reviewed  Review of Systems Per HPI unless specifically indicated above     Objective:    BP (!) 148/98   Pulse 73   Temp 98.5 F (36.9 C)   Ht 5\' 6"  (1.676 m)   Wt 161 lb 14.4 oz (73.4 kg)   LMP 10/27/2015   SpO2 95%   BMI 26.13 kg/m    Wt Readings from Last 3 Encounters:  10/10/17 161 lb 14.4 oz (73.4 kg)  05/09/17 177 lb 1.6 oz (80.3 kg)  04/11/17 178 lb (80.7 kg)    Physical Exam  Constitutional: She appears well-developed and well-nourished. No distress.  Weight loss noted, patient claims all intentional  HENT:  Head: Normocephalic and atraumatic.  Eyes: EOM are normal. No scleral icterus.  Neck: No thyromegaly present.  Cardiovascular: Normal rate, regular rhythm and normal heart sounds.  No murmur heard. Pulmonary/Chest: Effort normal and breath sounds normal. No respiratory distress. She has no wheezes.  Abdominal: Soft. Bowel sounds are normal. She exhibits no distension.  Musculoskeletal: She exhibits no edema.  Neurological: She is alert.  Skin: Skin is warm and dry. She is not diaphoretic. No pallor.  Psychiatric: She has a normal mood and affect. Her behavior is normal. Judgment and thought content normal.    Results for orders placed or performed in visit on 09/11/17  Hepatic function panel  Result Value Ref Range   Total Protein 7.9 6.1 - 8.1 g/dL   Albumin 5.1 3.6 - 5.1 g/dL   Globulin 2.8 1.9 - 3.7 g/dL (calc)   AG Ratio 1.8 1.0 - 2.5 (calc)   Total Bilirubin 0.8 0.2 - 1.2 mg/dL   Bilirubin, Direct 0.2 0.0 - 0.2 mg/dL   Indirect Bilirubin 0.6 0.2 - 1.2 mg/dL (calc)   Alkaline phosphatase (APISO) 78 33 - 130 U/L   AST 50 (H) 10 - 35 U/L   ALT 46 (H) 6 - 29 U/L  Lipid panel  Result Value Ref Range   Cholesterol 188 <200 mg/dL   HDL 65 >50 mg/dL   Triglycerides 126 <150 mg/dL   LDL Cholesterol (Calc) 100 (H) mg/dL (calc)   Total CHOL/HDL Ratio 2.9 <5.0 (calc)   Non-HDL Cholesterol (Calc) 123 <130 mg/dL (calc)      Assessment & Plan:   Problem List Items Addressed This Visit      Cardiovascular and Mediastinum   Essential hypertension, benign - Primary (Chronic)    Uncontrolled; she did not take her medicines today; urged her to take medicines before appointments here; try DASH  guidelines; return in one week for BP and pulse recheck      Relevant Medications   lisinopril-hydrochlorothiazide (PRINZIDE,ZESTORETIC) 10-12.5 MG tablet   atorvastatin (LIPITOR) 10 MG tablet     Endocrine   Impaired fasting  glucose (Chronic)    Check glucose and A1c      Relevant Orders   Hemoglobin A1c     Other   Breast cancer screening   Relevant Orders   MM 3D SCREEN BREAST BILATERAL   Hyperlipidemia (Chronic)    LDL came down nicely; avoid saturated fats; on low dose statin, refills provided      Relevant Medications   lisinopril-hydrochlorothiazide (PRINZIDE,ZESTORETIC) 10-12.5 MG tablet   atorvastatin (LIPITOR) 10 MG tablet   Allergic conjunctivitis    Switch eye drops, start singulair; switch antihistamines; patient politely declined allergy testing and allergy shots      Abnormal liver function tests    Check labs today      Relevant Orders   COMPLETE METABOLIC PANEL WITH GFR    Other Visit Diagnoses    Tachycardia       Relevant Orders   CBC with Differential/Platelet   TSH       Follow up plan: Return in about 6 months (around 04/12/2018) for twenty minute follow-up with fasting labs;  two weeks for CMA visit for pulse and BP.  An after-visit summary was printed and given to the patient at [place].  Please see the patient instructions which may contain other information and recommendations beyond what is mentioned above in the assessment and plan.  Meds ordered this encounter  Medications  . montelukast (SINGULAIR) 10 MG tablet    Sig: Take 1 tablet (10 mg total) by mouth at bedtime.    Dispense:  30 tablet    Refill:  11  . loratadine (CLARITIN) 10 MG tablet    Sig: Take 1 tablet (10 mg total) by mouth daily as needed for allergies. (this replaces fexofenadine)    Dispense:  30 tablet    Refill:  11  . SYMBICORT 160-4.5 MCG/ACT inhaler    Sig: Inhale 2 puffs into the lungs 2 (two) times daily.    Dispense:  3 Inhaler    Refill:  3  .  lisinopril-hydrochlorothiazide (PRINZIDE,ZESTORETIC) 10-12.5 MG tablet    Sig: Take 1 tablet by mouth daily.    Dispense:  90 tablet    Refill:  1  . atorvastatin (LIPITOR) 10 MG tablet    Sig: Take 1 tablet (10 mg total) by mouth at bedtime.    Dispense:  90 tablet    Refill:  1  . azelastine (OPTIVAR) 0.05 % ophthalmic solution    Sig: Place 1 drop into both eyes 2 (two) times daily.    Dispense:  6 mL    Refill:  12    Orders Placed This Encounter  Procedures  . MM 3D SCREEN BREAST BILATERAL  . Hemoglobin A1c  . CBC with Differential/Platelet  . COMPLETE METABOLIC PANEL WITH GFR  . TSH

## 2017-10-10 NOTE — Patient Instructions (Signed)
Keep up the good job with healthier eating Have the ultrasound done tomorrow Mammogram in September Please do call to schedule your mammogram; the number to schedule one at either [facility] or [facility] is [phone]

## 2017-10-10 NOTE — Assessment & Plan Note (Signed)
Check labs today.

## 2017-10-10 NOTE — Assessment & Plan Note (Signed)
Switch eye drops, start singulair; switch antihistamines; patient politely declined allergy testing and allergy shots

## 2017-10-10 NOTE — Assessment & Plan Note (Signed)
Check glucose and A1c 

## 2017-10-11 ENCOUNTER — Telehealth: Payer: Self-pay

## 2017-10-11 ENCOUNTER — Ambulatory Visit
Admission: RE | Admit: 2017-10-11 | Discharge: 2017-10-11 | Disposition: A | Discharge status: Home / Self Care | Payer: Medicare HMO | Source: Ambulatory Visit | Attending: Family Medicine | Admitting: Family Medicine

## 2017-10-11 DIAGNOSIS — R7989 Other specified abnormal findings of blood chemistry: Secondary | ICD-10-CM

## 2017-10-11 DIAGNOSIS — R945 Abnormal results of liver function studies: Secondary | ICD-10-CM

## 2017-10-11 LAB — COMPLETE METABOLIC PANEL WITH GFR
AG Ratio: 1.8 (calc) (ref 1.0–2.5)
ALT: 44 U/L — ABNORMAL HIGH (ref 6–29)
AST: 47 U/L — ABNORMAL HIGH (ref 10–35)
Albumin: 5.1 g/dL (ref 3.6–5.1)
Alkaline phosphatase (APISO): 80 U/L (ref 33–130)
BUN: 17 mg/dL (ref 7–25)
CO2: 27 mmol/L (ref 20–32)
CREATININE: 0.91 mg/dL (ref 0.50–0.99)
Calcium: 10.5 mg/dL — ABNORMAL HIGH (ref 8.6–10.4)
Chloride: 98 mmol/L (ref 98–110)
GFR, EST AFRICAN AMERICAN: 79 mL/min/{1.73_m2} (ref >=60)
GFR, Est Non African American: 68 mL/min/{1.73_m2} (ref >=60)
GLOBULIN: 2.8 g/dL (ref 1.9–3.7)
GLUCOSE: 102 mg/dL — AB (ref 65–99)
Potassium: 3.2 mmol/L — ABNORMAL LOW (ref 3.5–5.3)
Sodium: 145 mmol/L (ref 135–146)
TOTAL PROTEIN: 7.9 g/dL (ref 6.1–8.1)
Total Bilirubin: 0.7 mg/dL (ref 0.2–1.2)

## 2017-10-11 LAB — CBC WITH DIFFERENTIAL/PLATELET
BASOS ABS: 73 {cells}/uL (ref 0–200)
Basophils Relative: 1 %
EOS PCT: 1.5 %
Eosinophils Absolute: 110 cells/uL (ref 15–500)
HEMATOCRIT: 46.1 % — AB (ref 35.0–45.0)
Hemoglobin: 16.2 g/dL — ABNORMAL HIGH (ref 11.7–15.5)
LYMPHS ABS: 1533 {cells}/uL (ref 850–3900)
MCH: 33.1 pg — ABNORMAL HIGH (ref 27.0–33.0)
MCHC: 35.1 g/dL (ref 32.0–36.0)
MCV: 94.1 fL (ref 80.0–100.0)
MPV: 10.1 fL (ref 7.5–12.5)
Monocytes Relative: 7.8 %
NEUTROS PCT: 68.7 %
Neutro Abs: 5015 cells/uL (ref 1500–7800)
Platelets: 247 10*3/uL (ref 140–400)
RBC: 4.9 10*6/uL (ref 3.80–5.10)
RDW: 13.3 % (ref 11.0–15.0)
TOTAL LYMPHOCYTE: 21 %
WBC mixed population: 569 cells/uL (ref 200–950)
WBC: 7.3 10*3/uL (ref 3.8–10.8)

## 2017-10-11 LAB — HEMOGLOBIN A1C
Hgb A1c MFr Bld: 6.1 % of total Hgb — ABNORMAL HIGH (ref <5.7)
Mean Plasma Glucose: 128 (calc)
eAG (mmol/L): 7.1 (calc)

## 2017-10-11 LAB — TSH: TSH: 0.49 mIU/L (ref 0.40–4.50)

## 2017-10-11 NOTE — Telephone Encounter (Signed)
-----   Message from Arnetha Courser, MD sent at 10/11/2017  2:34 PM EDT ----- Please let the patient know that her liver shows changes suggestive of fatty liver or another liver disease; please REFER to GI

## 2017-10-14 ENCOUNTER — Other Ambulatory Visit: Payer: Self-pay | Admitting: Nurse Practitioner

## 2017-10-14 DIAGNOSIS — E876 Hypokalemia: Secondary | ICD-10-CM

## 2017-10-17 ENCOUNTER — Encounter: Payer: Self-pay | Admitting: Gastroenterology

## 2017-10-17 ENCOUNTER — Ambulatory Visit (INDEPENDENT_AMBULATORY_CARE_PROVIDER_SITE_OTHER): Payer: Self-pay | Admitting: Gastroenterology

## 2017-10-17 VITALS — BP 125/81 | HR 81 | Resp 17 | Ht 66.0 in | Wt 170.4 lb

## 2017-10-17 DIAGNOSIS — R1013 Epigastric pain: Secondary | ICD-10-CM

## 2017-10-20 DIAGNOSIS — J449 Chronic obstructive pulmonary disease, unspecified: Secondary | ICD-10-CM | POA: Diagnosis not present

## 2017-10-21 ENCOUNTER — Other Ambulatory Visit: Payer: Self-pay | Admitting: Family Medicine

## 2017-10-22 NOTE — Telephone Encounter (Signed)
Last OV: 10/10/17 Next: 04/14/18

## 2017-10-23 ENCOUNTER — Ambulatory Visit: Payer: Medicare HMO | Admitting: Gastroenterology

## 2017-10-24 ENCOUNTER — Ambulatory Visit: Payer: Medicare HMO

## 2017-11-01 ENCOUNTER — Other Ambulatory Visit: Payer: Self-pay

## 2017-11-01 ENCOUNTER — Ambulatory Visit: Payer: Medicare HMO | Admitting: Gastroenterology

## 2017-11-01 ENCOUNTER — Encounter: Payer: Self-pay | Admitting: Gastroenterology

## 2017-11-01 VITALS — BP 170/90 | HR 83 | Ht 66.0 in | Wt 166.4 lb

## 2017-11-01 DIAGNOSIS — R748 Abnormal levels of other serum enzymes: Secondary | ICD-10-CM | POA: Diagnosis not present

## 2017-11-01 DIAGNOSIS — R7989 Other specified abnormal findings of blood chemistry: Secondary | ICD-10-CM | POA: Diagnosis not present

## 2017-11-01 DIAGNOSIS — E876 Hypokalemia: Secondary | ICD-10-CM | POA: Diagnosis not present

## 2017-11-01 NOTE — Progress Notes (Signed)
Vonda [facility] [place]  [place], [place] [address]  Main: [phone]  Fax: [phone]   Gastroenterology Consultation  Referring Provider:     Arnetha Courser, MD Primary Care Physician:  Arnetha Courser, MD Primary Gastroenterologist:  Dr. Vonda Antigua Reason for Consultation:     Elevated liver enzymes        HPI:    Chief Complaint  Patient presents with  . abnormal liver enzymes    Andrea Bell is a 62 y.o. y/o female referred for consultation & management  by Dr. Arnetha Courser, MD.  Pt has chronically elevated transaminases in the 1s. The patient denies abdominal or flank pain, anorexia, nausea or vomiting, dysphagia, change in bowel habits or black or bloody stools or weight loss.  No history of alcohol abuse. Does report drinking about twice a week, about 2 drinks in a sitting. No episodes of confusion or bleeding. Acute hep panel negative in 2018. Recent ultrasound showed fatty liver. No hepatotoxic drugs including OTC supplements, green tea or herbal products.   Reports a colonoscopy 7 years and states it was normal. Report not available to [place].   Past Medical History:  Diagnosis Date  . Anemia   . Chronic back pain   . COPD (chronic obstructive pulmonary disease) (HCC)    2l home oxygen  . Dyspnea   . GERD (gastroesophageal reflux disease)   . HTN (hypertension)   . Insomnia   . Osteoarthritis   . Osteoporosis   . Tachycardia     Past Surgical History:  Procedure Laterality Date  . CYST REMOVAL TRUNK    . CYSTECTOMY     back and side of head  . CYSTOSCOPY N/A 12/13/2015   Procedure: CYSTOSCOPY;  Surgeon: Malachy Mood, MD;  Location: [facility]S;  Service: Gynecology;  Laterality: N/A;  . LAPAROSCOPIC BILATERAL SALPINGO OOPHERECTOMY Bilateral 12/13/2015   Procedure: LAPAROSCOPIC BILATERAL SALPINGO OOPHORECTOMY;  Surgeon: Malachy Mood, MD;  Location: [facility];  Service: Gynecology;  Laterality: Bilateral;  .  LAPAROSCOPIC HYSTERECTOMY N/A 12/13/2015   Procedure: HYSTERECTOMY TOTAL LAPAROSCOPIC;  Surgeon: Malachy Mood, MD;  Location: [facility] OR[facility];  Service: Gynecology;  Laterality: N/A;  . tear duct surgery     bilateral  . TUBAL LIGATION      Prior to Admission medications   Medication Sig Start Date End Date Taking? Authorizing Provider  albuterol (PROVENTIL HFA;VENTOLIN HFA) 108 (90 Base) MCG/ACT inhaler Inhale 2 puffs into the lungs every 6 (six) hours as needed for wheezing or shortness of breath. 05/21/17  Yes Lada, Satira Anis, MD  albuterol (PROVENTIL) (2.5 MG/3ML) 0.083% nebulizer solution Take 3 mLs (2.5 mg total) by nebulization every 4 (four) hours as needed for wheezing or shortness of breath. 10/09/16  Yes Lada, Satira Anis, MD  ALPRAZolam Duanne Moron) 0.5 MG tablet One or two tablets by mouth one hour prior to procedure, dentist visit, lab draw; no alcohol; do not drive for six hours 05/04/15  Yes Lada, Satira Anis, MD  aspirin EC 81 MG tablet Take 81 mg by mouth daily.   Yes [provider]  atorvastatin (LIPITOR) 10 MG tablet Take 1 tablet (10 mg total) by mouth at bedtime. 10/10/17  Yes Lada, Satira Anis, MD  azelastine (OPTIVAR) 0.05 % ophthalmic solution Place 1 drop into both eyes 2 (two) times daily. 10/10/17  Yes Lada, Satira Anis, MD  cloNIDine (CATAPRES) 0.1 MG tablet Take 1 tablet (0.1 mg total) by mouth at bedtime. For hot  flashes 05/09/17  Yes Lada, Satira Anis, MD  docusate sodium (COLACE) 100 MG capsule Take 100 mg by mouth 2 (two) times daily as needed for mild constipation.   Yes [provider]  lisinopril-hydrochlorothiazide (PRINZIDE,ZESTORETIC) 10-12.5 MG tablet Take 1 tablet by mouth daily. 10/10/17  Yes Lada, Satira Anis, MD  loratadine (CLARITIN) 10 MG tablet Take 1 tablet (10 mg total) by mouth daily as needed for allergies. (this replaces fexofenadine) 10/10/17  Yes Lada, Satira Anis, MD  meclizine (ANTIVERT) 25 MG tablet Take 1 tablet (25 mg total) by mouth 3 (three) times  daily as needed for dizziness. 05/09/17  Yes Lada, Satira Anis, MD  metoprolol succinate (TOPROL-XL) 25 MG 24 hr tablet TAKE 1/2 TABLET EVERY DAY 10/22/17  Yes Poulose, Bethel Born, NP  montelukast (SINGULAIR) 10 MG tablet Take 1 tablet (10 mg total) by mouth at bedtime. 10/10/17  Yes Lada, Satira Anis, MD  Olopatadine HCl 0.2 % SOLN INSTILL 1 DROP INTO BOTH EYES EVERY DAY 10/22/17  Yes Poulose, Bethel Born, NP  OXYGEN Inhale 2 L into the lungs as needed.    Yes [provider]  potassium chloride (K-DUR) 10 MEQ tablet Take 1 tablet (10 mEq total) by mouth daily. 04/25/17  Yes Lada, Satira Anis, MD  potassium chloride (K-DUR,KLOR-CON) 10 MEQ tablet TAKE 1 TABLET EVERY DAY 10/22/17  Yes Poulose, Bethel Born, NP  SPIRIVA HANDIHALER 18 MCG inhalation capsule  04/03/16  Yes [provider]  SYMBICORT 160-4.5 MCG/ACT inhaler Inhale 2 puffs into the lungs 2 (two) times daily. 10/10/17  Yes Lada, Satira Anis, MD  traZODone (DESYREL) 100 MG tablet TAKE 1 TABLET AT BEDTIME AS NEEDED FOR SLEEP. 10/22/17  Yes Poulose, Bethel Born, NP  triamcinolone (NASACORT) 55 MCG/ACT AERO nasal inhaler Place 2 sprays into the nose daily. 05/09/17  Yes Lada, Satira Anis, MD    Family History  Problem Relation Age of Onset  . Hypertension Mother   . Diabetes Mellitus II Mother   . Dementia Mother   . Diabetes Mother   . Cancer - Other Father        brain tumor  . Cancer Father        brain tumor  . Breast cancer Maternal Aunt 57  . Cancer Maternal Aunt        ovarian  . Breast cancer Maternal Grandmother 45  . Cancer Maternal Grandmother        breast  . Diabetes Brother   . Hypertension Brother   . COPD Brother   . Cancer Maternal Grandfather        stomach  . Heart disease Paternal Grandmother   . Stroke Paternal Grandfather      Social History   Tobacco Use  . Smoking status: Former Smoker    Packs/day: 0.50    Years: 15.00    Pack years: 7.50    Types: Cigarettes    Last attempt to quit: 04/25/2011     Years since quitting: 6.5  . Smokeless tobacco: Never Used  Substance Use Topics  . Alcohol use: Yes    Alcohol/week: 0.0 standard drinks    Comment: occasional drinks  . Drug use: No    Allergies as of 11/01/2017 - Review Complete 11/01/2017  Allergen Reaction Noted  . Codeine Nausea And Vomiting 05/25/2014    Review of Systems:    All systems reviewed and negative except where noted in HPI.   Physical Exam:  BP (!) 170/90   Pulse 83  Ht 5\' 6"  (1.676 m)   Wt 166 lb 6.4 oz (75.5 kg)   LMP 10/27/2015   BMI 26.86 kg/m  Patient's last menstrual period was 10/27/2015. Psych:  Alert and cooperative. Normal mood and affect. General:   Alert,  Well-developed, well-nourished, pleasant and cooperative in NAD Head:  Normocephalic and atraumatic. Eyes:  Sclera clear, no icterus.   Conjunctiva pink. Ears:  Normal auditory acuity. Nose:  No deformity, discharge, or lesions. Mouth:  No deformity or lesions,oropharynx pink & moist. Neck:  Supple; no masses or thyromegaly. Abdomen:  Normal bowel sounds.  No bruits.  Soft, non-tender and non-distended without masses, hepatosplenomegaly or hernias noted.  No guarding or rebound tenderness.    Msk:  Symmetrical without gross deformities. Good, equal movement & strength bilaterally. Pulses:  Normal pulses noted. Extremities:  No clubbing or edema.  No cyanosis. Neurologic:  Alert and oriented x3;  grossly normal neurologically. Skin:  Intact without significant lesions or rashes. No jaundice. Lymph Nodes:  No significant cervical adenopathy. Psych:  Alert and cooperative. Normal mood and affect.   Labs: CBC    Component Value Date/Time   WBC 7.3 10/10/2017 1226   RBC 4.90 10/10/2017 1226   HGB 16.2 (H) 10/10/2017 1226   HGB 14.2 10/14/2013 0506   HCT 46.1 (H) 10/10/2017 1226   HCT 45.2 10/14/2013 0506   PLT 247 10/10/2017 1226   PLT 314 10/14/2013 0506   MCV 94.1 10/10/2017 1226   MCV 95 10/14/2013 0506   MCH 33.1 (H)  10/10/2017 1226   MCHC 35.1 10/10/2017 1226   RDW 13.3 10/10/2017 1226   RDW 14.3 10/14/2013 0506   LYMPHSABS 1,533 10/10/2017 1226   LYMPHSABS 0.7 (L) 10/14/2013 0506   MONOABS 590 09/29/2015 0919   MONOABS 0.9 10/14/2013 0506   EOSABS 110 10/10/2017 1226   EOSABS 0.0 10/14/2013 0506   BASOSABS 73 10/10/2017 1226   BASOSABS 0.0 10/14/2013 0506   CMP     Component Value Date/Time   NA 145 10/10/2017 1226   NA 145 (H) 04/06/2015 1401   NA 142 10/13/2013 0419   K 3.2 (L) 10/10/2017 1226   K 4.2 10/13/2013 0419   CL 98 10/10/2017 1226   CL 104 10/13/2013 0419   CO2 27 10/10/2017 1226   CO2 27 10/13/2013 0419   GLUCOSE 102 (H) 10/10/2017 1226   GLUCOSE 174 (H) 10/13/2013 0419   BUN 17 10/10/2017 1226   BUN 13 04/06/2015 1401   BUN 21 (H) 10/13/2013 0419   CREATININE 0.91 10/10/2017 1226   CALCIUM 10.5 (H) 10/10/2017 1226   CALCIUM 9.5 10/13/2013 0419   PROT 7.9 10/10/2017 1226   PROT 6.6 04/06/2015 1401   PROT 8.2 02/05/2013 0642   ALBUMIN 4.5 10/09/2016 1353   ALBUMIN 4.3 04/06/2015 1401   ALBUMIN 3.9 02/05/2013 0642   AST 47 (H) 10/10/2017 1226   AST 20 02/05/2013 0642   ALT 44 (H) 10/10/2017 1226   ALT 19 02/05/2013 0642   ALKPHOS 85 10/09/2016 1353   ALKPHOS 100 02/05/2013 0642   BILITOT 0.7 10/10/2017 1226   BILITOT 0.3 04/06/2015 1401   BILITOT 0.5 02/05/2013 0642   GFRNONAA 68 10/10/2017 1226   GFRAA 79 10/10/2017 1226    Imaging Studies: US Abdomen Limited Ruq  Result Date: 10/11/2017 CLINICAL DATA:  Elevated liver function tests. EXAM: ULTRASOUND ABDOMEN LIMITED RIGHT UPPER QUADRANT COMPARISON:  Ultrasound of July 27, 2009. FINDINGS: Gallbladder: No gallstones or wall thickening visualized. No sonographic Murphy sign noted  by sonographer. Common bile duct: Diameter: 4 mm which is within normal limits. Liver: No focal lesion identified. Increased echogenicity of hepatic parenchyma is noted. Portal vein is patent on color Doppler imaging with normal direction  of blood flow towards the liver. IMPRESSION: Increased echogenicity of hepatic parenchyma is noted suggesting fatty infiltration or other diffuse hepatocellular disease. No other abnormality seen in the abdomen. Electronically Signed   By: Marijo Conception, M.D.   On: 10/11/2017 11:24    Assessment and Plan:   Andrea Bell is a 62 y.o. y/o female has been referred for elevated transaminases  Clinical history, labs and imaging consistent with fatty liver Finding of fatty liver on imaging discussed with patient Diet, weight loss, and exercise encouraged along with avoiding hepatotoxic drugs including alcohol Risk of progression to cirrhosis if above measures are not instituted were discussed as well, and patient verbalized understanding  Pt also advised against alcohol use and she verbalized understanding.  Will complete bloodwork to rule out viral and autoimmune hepatitis. If not immune to Hep A and B will recommend vaccination for both due to underlying fatty liver  Pt asked to follow up with PCP in regard to her colonoscopy and review her report with her PCP to determine when her next screening/surveillance was recommended.   Continue to avoid hepatotoxic drugs    Dr Vonda Antigua

## 2017-11-02 LAB — PTH, INTACT AND CALCIUM
Calcium: 10.4 mg/dL — ABNORMAL HIGH (ref 8.7–10.3)
PTH: 46 pg/mL (ref 15–65)

## 2017-11-02 LAB — MAGNESIUM: MAGNESIUM: 1.7 mg/dL (ref 1.6–2.3)

## 2017-11-02 LAB — POTASSIUM: POTASSIUM: 3.9 mmol/L (ref 3.5–5.2)

## 2017-11-04 LAB — HEPATITIS C ANTIBODY (REFLEX)

## 2017-11-04 LAB — MITOCHONDRIAL/SMOOTH MUSCLE AB PNL
Mitochondrial Ab: <20 Units (ref 0.0–20.0)
Smooth Muscle Ab: 11 Units (ref 0–19)

## 2017-11-04 LAB — HCV COMMENT:

## 2017-11-04 LAB — FERRITIN: Ferritin: 311 ng/mL — ABNORMAL HIGH (ref 15–150)

## 2017-11-04 LAB — HEPATITIS A ANTIBODY, TOTAL: Hep A Total Ab: NEGATIVE

## 2017-11-04 LAB — CERULOPLASMIN: Ceruloplasmin: 25.2 mg/dL (ref 19.0–39.0)

## 2017-11-04 LAB — HEPATITIS B SURFACE ANTIBODY,QUALITATIVE: Hep B Surface Ab, Qual: NONREACTIVE

## 2017-11-04 LAB — HEPATITIS B CORE ANTIBODY, TOTAL: Hep B Core Total Ab: NEGATIVE

## 2017-11-04 LAB — HEPATITIS B SURFACE ANTIGEN: HEP B S AG: NEGATIVE

## 2017-11-08 ENCOUNTER — Telehealth: Payer: Self-pay

## 2017-11-08 ENCOUNTER — Other Ambulatory Visit: Payer: Self-pay

## 2017-11-08 DIAGNOSIS — R7989 Other specified abnormal findings of blood chemistry: Secondary | ICD-10-CM

## 2017-11-08 NOTE — Telephone Encounter (Signed)
Pt notified of lab results. Pt will stop by next week to get the additional labs and Hep A & B vaccines.

## 2017-11-08 NOTE — Telephone Encounter (Signed)
-----   Message from [name], MD sent at 11/04/2017  3:50 PM EDT ----- Jackelyn Poling please let patient know, her blood work was normal except for elevated ferritin.  This by itself mean that she has iron overload.  I have ordered additional testing for this to test for hemochromatosis.  Please ask her to get this done. Please also tell her that she should get immunization for hepatitis A and B by her primary care provider, and also have her regular immunizations such as flu shot done as well. Follow-up in clinic in 6 months.

## 2017-11-19 DIAGNOSIS — J449 Chronic obstructive pulmonary disease, unspecified: Secondary | ICD-10-CM | POA: Diagnosis not present

## 2017-12-04 ENCOUNTER — Ambulatory Visit
Admission: RE | Admit: 2017-12-04 | Discharge: 2017-12-04 | Disposition: A | Discharge status: Home / Self Care | Payer: Medicare HMO | Source: Ambulatory Visit | Attending: Family Medicine | Admitting: Family Medicine

## 2017-12-04 DIAGNOSIS — Z1239 Encounter for other screening for malignant neoplasm of breast: Secondary | ICD-10-CM

## 2017-12-04 DIAGNOSIS — Z1231 Encounter for screening mammogram for malignant neoplasm of breast: Secondary | ICD-10-CM | POA: Insufficient documentation

## 2017-12-20 DIAGNOSIS — J449 Chronic obstructive pulmonary disease, unspecified: Secondary | ICD-10-CM | POA: Diagnosis not present

## 2018-01-19 DIAGNOSIS — J449 Chronic obstructive pulmonary disease, unspecified: Secondary | ICD-10-CM | POA: Diagnosis not present

## 2018-02-19 DIAGNOSIS — J449 Chronic obstructive pulmonary disease, unspecified: Secondary | ICD-10-CM | POA: Diagnosis not present

## 2018-03-22 DIAGNOSIS — J449 Chronic obstructive pulmonary disease, unspecified: Secondary | ICD-10-CM | POA: Diagnosis not present

## 2018-04-14 ENCOUNTER — Ambulatory Visit: Payer: Medicare HMO | Admitting: Family Medicine

## 2018-04-17 ENCOUNTER — Encounter: Payer: Self-pay | Admitting: Family Medicine

## 2018-04-17 ENCOUNTER — Ambulatory Visit (INDEPENDENT_AMBULATORY_CARE_PROVIDER_SITE_OTHER): Payer: Medicare HMO

## 2018-04-17 ENCOUNTER — Ambulatory Visit (INDEPENDENT_AMBULATORY_CARE_PROVIDER_SITE_OTHER): Payer: Medicare HMO | Admitting: Family Medicine

## 2018-04-17 VITALS — BP 140/96 | HR 100 | Temp 97.5°F | Resp 12 | Ht 66.0 in | Wt 166.0 lb

## 2018-04-17 VITALS — BP 142/104 | HR 106 | Temp 97.5°F | Resp 16 | Ht 66.0 in | Wt 157.1 lb

## 2018-04-17 DIAGNOSIS — J449 Chronic obstructive pulmonary disease, unspecified: Secondary | ICD-10-CM | POA: Diagnosis not present

## 2018-04-17 DIAGNOSIS — E2839 Other primary ovarian failure: Secondary | ICD-10-CM

## 2018-04-17 DIAGNOSIS — Z0000 Encounter for general adult medical examination without abnormal findings: Secondary | ICD-10-CM | POA: Diagnosis not present

## 2018-04-17 DIAGNOSIS — J9611 Chronic respiratory failure with hypoxia: Secondary | ICD-10-CM

## 2018-04-17 DIAGNOSIS — Z87891 Personal history of nicotine dependence: Secondary | ICD-10-CM

## 2018-04-17 DIAGNOSIS — F40231 Fear of injections and transfusions: Secondary | ICD-10-CM

## 2018-04-17 DIAGNOSIS — H04223 Epiphora due to insufficient drainage, bilateral lacrimal glands: Secondary | ICD-10-CM | POA: Insufficient documentation

## 2018-04-17 DIAGNOSIS — R0602 Shortness of breath: Secondary | ICD-10-CM | POA: Diagnosis not present

## 2018-04-17 DIAGNOSIS — J3089 Other allergic rhinitis: Secondary | ICD-10-CM

## 2018-04-17 DIAGNOSIS — R7989 Other specified abnormal findings of blood chemistry: Secondary | ICD-10-CM

## 2018-04-17 DIAGNOSIS — Z1211 Encounter for screening for malignant neoplasm of colon: Secondary | ICD-10-CM

## 2018-04-17 DIAGNOSIS — I10 Essential (primary) hypertension: Secondary | ICD-10-CM

## 2018-04-17 DIAGNOSIS — J9691 Respiratory failure, unspecified with hypoxia: Secondary | ICD-10-CM | POA: Insufficient documentation

## 2018-04-17 DIAGNOSIS — R7301 Impaired fasting glucose: Secondary | ICD-10-CM

## 2018-04-17 DIAGNOSIS — Z9981 Dependence on supplemental oxygen: Secondary | ICD-10-CM | POA: Diagnosis not present

## 2018-04-17 DIAGNOSIS — R945 Abnormal results of liver function studies: Secondary | ICD-10-CM

## 2018-04-17 DIAGNOSIS — Z5181 Encounter for therapeutic drug level monitoring: Secondary | ICD-10-CM

## 2018-04-17 DIAGNOSIS — H04203 Unspecified epiphora, bilateral lacrimal glands: Secondary | ICD-10-CM | POA: Diagnosis not present

## 2018-04-17 DIAGNOSIS — E782 Mixed hyperlipidemia: Secondary | ICD-10-CM

## 2018-04-17 DIAGNOSIS — J432 Centrilobular emphysema: Secondary | ICD-10-CM

## 2018-04-17 MED ORDER — VENLAFAXINE HCL ER 37.5 MG PO CP24: 37.5000 mg | ORAL_CAPSULE | Freq: Every day | ORAL | 0 refills | Status: DC

## 2018-04-17 MED ORDER — LISINOPRIL-HYDROCHLOROTHIAZIDE 10-12.5 MG PO TABS: 2.0000 | ORAL_TABLET | Freq: Every day | ORAL | Status: DC

## 2018-04-17 NOTE — Assessment & Plan Note (Signed)
Patient says she will just deal with it; cannot afford to see specialist or have surgery for tear ducts

## 2018-04-17 NOTE — Assessment & Plan Note (Signed)
Patient says she does not have allergies and the allergy pill is not helping; I advised to stop the loratidine

## 2018-04-17 NOTE — Assessment & Plan Note (Signed)
Planning to do labs next week and she can take anxiety pill prior to draw; she knows to not drive herself when taking that medicine, effects can last up to 6 hours

## 2018-04-17 NOTE — Patient Instructions (Addendum)
Increase your lisinopril-hctz to two pills every day Return in 7-10 days for recheck of your blood pressure We'll get labs at that visit, so be prepared and take your pill to relax Do not drive yourself  Try to limit saturated fats in your diet (bologna, hot dogs, barbeque, cheeseburgers, hamburgers, steak, bacon, sausage, cheese, etc.) and get more fresh fruits, vegetables, and whole grains Try to follow the DASH guidelines (DASH stands for Dietary Approaches to Stop Hypertension). Try to limit the sodium in your diet to no more than 1,500mg  of sodium per day. Certainly try to not exceed 2,000 mg per day at the very most. Do not add salt when cooking or at the table.  Check the sodium amount on labels when shopping, and choose items lower in sodium when given a choice. Avoid or limit foods that already contain a lot of sodium. Eat a diet rich in fruits and vegetables and whole grains, and try to lose weight if overweight or obese  DASH Eating Plan DASH stands for "Dietary Approaches to Stop Hypertension." The DASH eating plan is a healthy eating plan that has been shown to reduce high blood pressure (hypertension). It may also reduce your risk for type 2 diabetes, heart disease, and stroke. The DASH eating plan may also help with weight loss. What are tips for following this plan?  General guidelines  Avoid eating more than 2,300 mg (milligrams) of salt (sodium) a day. If you have hypertension, you may need to reduce your sodium intake to 1,500 mg a day.  Limit alcohol intake to no more than 1 drink a day for nonpregnant women and 2 drinks a day for men. One drink equals 12 oz of beer, 5 oz of wine, or 1 oz of hard liquor.  Work with your health care provider to maintain a healthy body weight or to lose weight. Ask what an ideal weight is for you.  Get at least 30 minutes of exercise that causes your heart to beat faster (aerobic exercise) most days of the week. Activities may include walking,  swimming, or biking.  Work with your health care provider or diet and nutrition specialist (dietitian) to adjust your eating plan to your individual calorie needs. Reading food labels   Check food labels for the amount of sodium per serving. Choose foods with less than 5 percent of the Daily Value of sodium. Generally, foods with less than 300 mg of sodium per serving fit into this eating plan.  To find whole grains, look for the word "whole" as the first word in the ingredient list. Shopping  Buy products labeled as "low-sodium" or "no salt added."  Buy fresh foods. Avoid canned foods and premade or frozen meals. Cooking  Avoid adding salt when cooking. Use salt-free seasonings or herbs instead of table salt or sea salt. Check with your health care provider or pharmacist before using salt substitutes.  Do not fry foods. Cook foods using healthy methods such as baking, boiling, grilling, and broiling instead.  Cook with heart-healthy oils, such as olive, canola, soybean, or sunflower oil. Meal planning  Eat a balanced diet that includes: ? 5 or more servings of fruits and vegetables each day. At each meal, try to fill half of your plate with fruits and vegetables. ? Up to 6-8 servings of whole grains each day. ? Less than 6 oz of lean meat, poultry, or fish each day. A 3-oz serving of meat is about the same size as a deck of  cards. One egg equals 1 oz. ? 2 servings of low-fat dairy each day. ? A serving of nuts, seeds, or beans 5 times each week. ? Heart-healthy fats. Healthy fats called Omega-3 fatty acids are found in foods such as flaxseeds and coldwater fish, like sardines, salmon, and mackerel.  Limit how much you eat of the following: ? Canned or prepackaged foods. ? Food that is high in trans fat, such as fried foods. ? Food that is high in saturated fat, such as fatty meat. ? Sweets, desserts, sugary drinks, and other foods with added sugar. ? Full-fat dairy  products.  Do not salt foods before eating.  Try to eat at least 2 vegetarian meals each week.  Eat more home-cooked food and less restaurant, buffet, and fast food.  When eating at a restaurant, ask that your food be prepared with less salt or no salt, if possible. What foods are recommended? The items listed may not be a complete list. Talk with your dietitian about what dietary choices are best for you. Grains Whole-grain or whole-wheat bread. Whole-grain or whole-wheat pasta. Brown rice. [place]row. Bulgur. Whole-grain and low-sodium cereals. Pita bread. Low-fat, low-sodium crackers. Whole-wheat flour tortillas. Vegetables Fresh or frozen vegetables (raw, steamed, roasted, or grilled). Low-sodium or reduced-sodium tomato and vegetable juice. Low-sodium or reduced-sodium tomato sauce and tomato paste. Low-sodium or reduced-sodium canned vegetables. Fruits All fresh, dried, or frozen fruit. Canned fruit in natural juice (without added sugar). Meat and other protein foods Skinless chicken or [place]. Ground chicken or [place]. Pork with fat trimmed off. Fish and seafood. Egg whites. Dried beans, peas, or lentils. Unsalted nuts, nut butters, and seeds. Unsalted canned beans. Lean cuts of beef with fat trimmed off. Low-sodium, lean deli meat. Dairy Low-fat (1%) or fat-free (skim) milk. Fat-free, low-fat, or reduced-fat cheeses. Nonfat, low-sodium ricotta or cottage cheese. Low-fat or nonfat yogurt. Low-fat, low-sodium cheese. Fats and oils Soft margarine without trans fats. Vegetable oil. Low-fat, reduced-fat, or light mayonnaise and salad dressings (reduced-sodium). Canola, safflower, olive, soybean, and sunflower oils. Avocado. Seasoning and other foods Herbs. Spices. Seasoning mixes without salt. Unsalted popcorn and pretzels. Fat-free sweets. What foods are not recommended? The items listed may not be a complete list. Talk with your dietitian about what dietary choices are best for  you. Grains Baked goods made with fat, such as croissants, muffins, or some breads. Dry pasta or rice meal packs. Vegetables Creamed or fried vegetables. Vegetables in a cheese sauce. Regular canned vegetables (not low-sodium or reduced-sodium). Regular canned tomato sauce and paste (not low-sodium or reduced-sodium). Regular tomato and vegetable juice (not low-sodium or reduced-sodium). Angie Fava. Olives. Fruits Canned fruit in a light or heavy syrup. Fried fruit. Fruit in cream or butter sauce. Meat and other protein foods Fatty cuts of meat. Ribs. Fried meat. Berniece Salines. Sausage. Bologna and other processed lunch meats. Salami. Fatback. Hotdogs. Bratwurst. Salted nuts and seeds. Canned beans with added salt. Canned or smoked fish. Whole eggs or egg yolks. Chicken or [place] with skin. Dairy Whole or 2% milk, cream, and half-and-half. Whole or full-fat cream cheese. Whole-fat or sweetened yogurt. Full-fat cheese. Nondairy creamers. Whipped toppings. Processed cheese and cheese spreads. Fats and oils Butter. Stick margarine. Lard. Shortening. Ghee. Bacon fat. Tropical oils, such as coconut, palm kernel, or palm oil. Seasoning and other foods Salted popcorn and pretzels. Onion salt, garlic salt, seasoned salt, table salt, and sea salt. Worcestershire sauce. Tartar sauce. Barbecue sauce. Teriyaki sauce. Soy sauce, including reduced-sodium. Steak sauce. Canned and packaged  gravies. Fish sauce. Oyster sauce. Cocktail sauce. Horseradish that you find on the shelf. Ketchup. Mustard. Meat flavorings and tenderizers. Bouillon cubes. Hot sauce and Tabasco sauce. Premade or packaged marinades. Premade or packaged taco seasonings. Relishes. Regular salad dressings. Where to find more information:  National Heart, Lung, and [place]: https://wilson-eaton.com/  American Heart Association: www.heart.org Summary  The DASH eating plan is a healthy eating plan that has been shown to reduce high blood pressure  (hypertension). It may also reduce your risk for type 2 diabetes, heart disease, and stroke.  With the DASH eating plan, you should limit salt (sodium) intake to 2,300 mg a day. If you have hypertension, you may need to reduce your sodium intake to 1,500 mg a day.  When on the DASH eating plan, aim to eat more fresh fruits and vegetables, whole grains, lean proteins, low-fat dairy, and heart-healthy fats.  Work with your health care provider or diet and nutrition specialist (dietitian) to adjust your eating plan to your individual calorie needs. This information is not intended to replace advice given to you by your health care provider. Make sure you discuss any questions you have with your health care provider. Document Released: 01/25/2011 Document Revised: 01/30/2016 Document Reviewed: 01/30/2016 Elsevier Interactive Patient Education  2019 [employer].

## 2018-04-17 NOTE — Assessment & Plan Note (Signed)
Seeing pulmonologist; using oxygen at night and during the day when needed

## 2018-04-17 NOTE — Patient Instructions (Signed)
Andrea Bell , Thank you for taking time to come for your Medicare Wellness Visit. I appreciate your ongoing commitment to your health goals. Please review the following plan we discussed and let me know if I can assist you in the future.   Screening recommendations/referrals: Colonoscopy: requesting records from [place]: done 12/04/17. Repeat every year.  Bone Density: due age 63 Recommended yearly ophthalmology/optometry visit for glaucoma screening and checkup Recommended yearly dental visit for hygiene and checkup  Vaccinations: Influenza vaccine: done 04/11/17 Pneumococcal vaccine: due age 65 Tdap vaccine: done 2014 Shingles vaccine: Shingrix discussed. Please contact your pharmacy for coverage information.   Advanced directives: Advance directive discussed with you today. I have provided a copy for you to complete at home and have notarized. Once this is complete please bring a copy in to our office so we can scan it into your chart.  Conditions/risks identified: Recommend healthy eating and physical activity for desired weight loss.   Next appointment: Please follow up in one year for your Medicare Annual Wellness visit.    Preventive Care 40-64 Years, Female Preventive care refers to lifestyle choices and visits with your health care provider that can promote health and wellness. What does preventive care include?  A yearly physical exam. This is also called an annual well check.  Dental exams once or twice a year.  Routine eye exams. Ask your health care provider how often you should have your eyes checked.  Personal lifestyle choices, including:  Daily care of your teeth and gums.  Regular physical activity.  Eating a healthy diet.  Avoiding tobacco and drug use.  Limiting alcohol use.  Practicing safe sex.  Taking low-dose aspirin daily starting at age 1.  Taking vitamin and mineral supplements as recommended by your health care  provider. What happens during an annual well check? The services and screenings done by your health care provider during your annual well check will depend on your age, overall health, lifestyle risk factors, and family history of disease. Counseling  Your health care provider may ask you questions about your:  Alcohol use.  Tobacco use.  Drug use.  Emotional well-being.  Home and relationship well-being.  Sexual activity.  Eating habits.  Work and work Statistician.  Method of birth control.  Menstrual cycle.  Pregnancy history. Screening  You may have the following tests or measurements:  Height, weight, and BMI.  Blood pressure.  Lipid and cholesterol levels. These may be checked every 5 years, or more frequently if you are over 36 years old.  Skin check.  Lung cancer screening. You may have this screening every year starting at age 25 if you have a 30-pack-year history of smoking and currently smoke or have quit within the past 15 years.  Fecal occult blood test (FOBT) of the stool. You may have this test every year starting at age 68.  Flexible sigmoidoscopy or colonoscopy. You may have a sigmoidoscopy every 5 years or a colonoscopy every 10 years starting at age 77.  Hepatitis C blood test.  Hepatitis B blood test.  Sexually transmitted disease (STD) testing.  Diabetes screening. This is done by checking your blood sugar (glucose) after you have not eaten for a while (fasting). You may have this done every 1-3 years.  Mammogram. This may be done every 1-2 years. Talk to your health care provider about when you should start having regular mammograms. This may depend on whether you have a family history of breast cancer.  BRCA-related cancer screening. This may be done if you have a family history of breast, ovarian, tubal, or peritoneal cancers.  Pelvic exam and Pap test. This may be done every 3 years starting at age 45. Starting at age 58, this may be  done every 5 years if you have a Pap test in combination with an HPV test.  Bone density scan. This is done to screen for osteoporosis. You may have this scan if you are at high risk for osteoporosis. Discuss your test results, treatment options, and if necessary, the need for more tests with your health care provider. Vaccines  Your health care provider may recommend certain vaccines, such as:  Influenza vaccine. This is recommended every year.  Tetanus, diphtheria, and acellular pertussis (Tdap, Td) vaccine. You may need a Td booster every 10 years.  Zoster vaccine. You may need this after age 69.  Pneumococcal 13-valent conjugate (PCV13) vaccine. You may need this if you have certain conditions and were not previously vaccinated.  Pneumococcal polysaccharide (PPSV23) vaccine. You may need one or two doses if you smoke cigarettes or if you have certain conditions. Talk to your health care provider about which screenings and vaccines you need and how often you need them. This information is not intended to replace advice given to you by your health care provider. Make sure you discuss any questions you have with your health care provider. Document Released: 03/04/2015 Document Revised: 10/26/2015 Document Reviewed: 12/07/2014 Elsevier Interactive Patient Education  2017 [place] Prevention in the Home Falls can cause injuries. They can happen to people of all ages. There are many things you can do to make your home safe and to help prevent falls. What can I do on the outside of my home?  Regularly fix the edges of walkways and driveways and fix any cracks.  Remove anything that might make you trip as you walk through a door, such as a raised step or threshold.  Trim any bushes or trees on the path to your home.  Use bright outdoor lighting.  Clear any walking paths of anything that might make someone trip, such as rocks or tools.  Regularly check to see if  handrails are loose or broken. Make sure that both sides of any steps have handrails.  Any raised decks and porches should have guardrails on the edges.  Have any leaves, snow, or ice cleared regularly.  Use sand or salt on walking paths during winter.  Clean up any spills in your garage right away. This includes oil or grease spills. What can I do in the bathroom?  Use night lights.  Install grab bars by the toilet and in the tub and shower. Do not use towel bars as grab bars.  Use non-skid mats or decals in the tub or shower.  If you need to sit down in the shower, use a plastic, non-slip stool.  Keep the floor dry. Clean up any water that spills on the floor as soon as it happens.  Remove soap buildup in the tub or shower regularly.  Attach bath mats securely with double-sided non-slip rug tape.  Do not have throw rugs and other things on the floor that can make you trip. What can I do in the bedroom?  Use night lights.  Make sure that you have a light by your bed that is easy to reach.  Do not use any sheets or blankets that are too big for your bed. They  should not hang down onto the floor.  Have a firm chair that has side arms. You can use this for support while you get dressed.  Do not have throw rugs and other things on the floor that can make you trip. What can I do in the kitchen?  Clean up any spills right away.  Avoid walking on wet floors.  Keep items that you use a lot in easy-to-reach places.  If you need to reach something above you, use a strong step stool that has a grab bar.  Keep electrical cords out of the way.  Do not use floor polish or wax that makes floors slippery. If you must use wax, use non-skid floor wax.  Do not have throw rugs and other things on the floor that can make you trip. What can I do with my stairs?  Do not leave any items on the stairs.  Make sure that there are handrails on both sides of the stairs and use them. Fix  handrails that are broken or loose. Make sure that handrails are as long as the stairways.  Check any carpeting to make sure that it is firmly attached to the stairs. Fix any carpet that is loose or worn.  Avoid having throw rugs at the top or bottom of the stairs. If you do have throw rugs, attach them to the floor with carpet tape.  Make sure that you have a light switch at the top of the stairs and the bottom of the stairs. If you do not have them, ask someone to add them for you. What else can I do to help prevent falls?  Wear shoes that:  Do not have high heels.  Have rubber bottoms.  Are comfortable and fit you well.  Are closed at the toe. Do not wear sandals.  If you use a stepladder:  Make sure that it is fully opened. Do not climb a closed stepladder.  Make sure that both sides of the stepladder are locked into place.  Ask someone to hold it for you, if possible.  Clearly mark and make sure that you can see:  Any grab bars or handrails.  First and last steps.  Where the edge of each step is.  Use tools that help you move around (mobility aids) if they are needed. These include:  Canes.  Walkers.  Scooters.  Crutches.  Turn on the lights when you go into a dark area. Replace any light bulbs as soon as they burn out.  Set up your furniture so you have a clear path. Avoid moving your furniture around.  If any of your floors are uneven, fix them.  If there are any pets around you, be aware of where they are.  Review your medicines with your doctor. Some medicines can make you feel dizzy. This can increase your chance of falling. Ask your doctor what other things that you can do to help prevent falls. This information is not intended to replace advice given to you by your health care provider. Make sure you discuss any questions you have with your health care provider. Document Released: 12/02/2008 Document Revised: 07/14/2015 Document Reviewed:  03/12/2014 Elsevier Interactive Patient Education  2017 [employer].

## 2018-04-17 NOTE — Assessment & Plan Note (Signed)
Limit saturated fats 

## 2018-04-17 NOTE — Progress Notes (Signed)
BP (!) 140/96 (BP Location: Left Arm, Cuff Size: Large)   Pulse 100   Temp (!) 97.5 F (36.4 C) (Oral)   Resp 12   Ht 5\' 6"  (1.676 m)   Wt 166 lb (75.3 kg)   LMP 10/27/2015 Comment: had hysterctomy  SpO2 99%   BMI 26.79 kg/m    Subjective:    Patient ID: Andrea Bell, female    DOB: 25-Jan-1956, 63 y.o.   MRN: [id number]  HPI: Andrea Bell is a 63 y.o. female  Chief Complaint  Patient presents with  . Follow-up    HPI Here for f/u She just saw [facility] for her Medicare Wellness visit  Hypertension; her BP at check in was 142/104; recheck in the room was , 140/96 She says "it's always high" She is taking lisinopril-hctz, 10/12.5 mg daily She does not add any salt to her food, "not as much as she used to"  Allergies; she says she has tear duct surgery issues; she had surgery at [place]; still taking medicine but not helpful  COPD; on symbicort and using oxygen; takes it with her everywhere she goes She sees Dr. Raul Del; patient has not   Arthritis; lower back  High cholesterol; fam hx of stroke; trying to limit saturated fats; on statin  Lab Results  Component Value Date   CHOL 188 09/11/2017   HDL 65 09/11/2017   LDLCALC 100 (H) 09/11/2017   TRIG 126 09/11/2017   CHOLHDL 2.9 09/11/2017    Prediabetes; limits sweets; drinking plenty water; little bit of both white and wheat bread; little bit of dry mouth; has cup of ice water; does not crunch on ice, has hx of anemia  Lab Results  Component Value Date   HGBA1C 6.1 (H) 10/10/2017   Hot flash medicine is not helping, clonidine is not doing anything  Depression screen [facility] 2/9 04/17/2018 10/10/2017 05/09/2017 04/11/2017 10/09/2016  Decreased Interest 0 0 0 0 0  Down, Depressed, Hopeless 0 0 0 0 0  PHQ - 2 Score 0 0 0 0 0  MD note: no depression  Fall Risk  04/17/2018 10/10/2017 05/09/2017 04/11/2017 10/09/2016  Falls in the past year? 0 No No No No  Number falls in past yr: 0 - - - -  Injury with Fall? 0 - - -  -  Follow up Falls prevention discussed - - - -    Relevant past medical, surgical, family and social history reviewed Past Medical History:  Diagnosis Date  . Allergy   . Anemia   . Chronic back pain   . COPD (chronic obstructive pulmonary disease) (HCC)    2l home oxygen  . Dyspnea   . GERD (gastroesophageal reflux disease)   . Headache   . HTN (hypertension)   . Hyperlipidemia   . Insomnia   . Insomnia   . Osteoarthritis   . Osteoporosis   . Oxygen deficiency   . Tachycardia    Past Surgical History:  Procedure Laterality Date  . ABDOMINAL HYSTERECTOMY    . CYST REMOVAL TRUNK    . CYSTECTOMY     back and side of head  . CYSTOSCOPY N/A 12/13/2015   Procedure: CYSTOSCOPY;  Surgeon: Malachy Mood, MD;  Location: [facility] ORS;  Service: Gynecology;  Laterality: N/A;  . LAPAROSCOPIC BILATERAL SALPINGO OOPHERECTOMY Bilateral 12/13/2015   Procedure: LAPAROSCOPIC BILATERAL SALPINGO OOPHORECTOMY;  Surgeon: Malachy Mood, MD;  Location: [facility] ORS;  Service: Gynecology;  Laterality: Bilateral;  . LAPAROSCOPIC HYSTERECTOMY N/A 12/13/2015  Procedure: HYSTERECTOMY TOTAL LAPAROSCOPIC;  Surgeon: Malachy Mood, MD;  Location: [facility] ORS;  Service: Gynecology;  Laterality: N/A;  . tear duct surgery     bilateral  . TUBAL LIGATION     Family History  Problem Relation Age of Onset  . Hypertension Mother   . Diabetes Mellitus II Mother   . Dementia Mother   . Diabetes Mother   . Cancer - Other Father        brain tumor  . Cancer Father        brain tumor  . Breast cancer Maternal Aunt 57  . Cancer Maternal Aunt        ovarian  . Breast cancer Maternal Grandmother 2  . Cancer Maternal Grandmother        breast  . Diabetes Brother   . Hypertension Brother   . COPD Brother   . Cancer Maternal Grandfather        stomach  . Heart disease Paternal Grandmother   . Stroke Paternal Grandfather    Social History   Tobacco Use  . Smoking status: Former Smoker    Packs/day:  0.50    Years: 15.00    Pack years: 7.50    Types: Cigarettes    Last attempt to quit: 04/25/2011    Years since quitting: 6.9  . Smokeless tobacco: Never Used  Substance Use Topics  . Alcohol use: Yes    Alcohol/week: 0.0 standard drinks    Comment: occasional drinks  . Drug use: No     Office Visit from 10/10/2017 in [facility]  AUDIT-C Score  3      Interim medical history since last visit reviewed. Allergies and medications reviewed  Review of Systems Per HPI unless specifically indicated above     Objective:    BP (!) 140/96 (BP Location: Left Arm, Cuff Size: Large)   Pulse 100   Temp (!) 97.5 F (36.4 C) (Oral)   Resp 12   Ht 5\' 6"  (1.676 m)   Wt 166 lb (75.3 kg)   LMP 10/27/2015 Comment: had hysterctomy  SpO2 99%   BMI 26.79 kg/m   Wt Readings from Last 3 Encounters:  04/17/18 166 lb (75.3 kg)  04/17/18 157 lb 1.6 oz (71.3 kg)  11/01/17 166 lb 6.4 oz (75.5 kg)    Physical Exam Constitutional:      General: She is not in acute distress.    Appearance: She is well-developed. She is not diaphoretic.  HENT:     Head: Normocephalic and atraumatic.  Eyes:     General: No scleral icterus. Neck:     Thyroid: No thyromegaly.  Cardiovascular:     Rate and Rhythm: Normal rate and regular rhythm.     Heart sounds: Normal heart sounds. No murmur.  Pulmonary:     Effort: Pulmonary effort is normal. No respiratory distress.     Breath sounds: Normal breath sounds. No wheezing.  Abdominal:     General: Bowel sounds are normal. There is no distension.     Palpations: Abdomen is soft.  Skin:    General: Skin is warm and dry.     Coloration: Skin is not pale.  Neurological:     Mental Status: She is alert.  Psychiatric:        Behavior: Behavior normal.        Thought Content: Thought content normal.        Judgment: Judgment normal.  Assessment & Plan:   Problem List Items Addressed This Visit      Cardiovascular and  Mediastinum   Essential hypertension, benign (Chronic)    Glad that she has really cut back on salt; increase ACE-I ; thiazide combo; return in 7-10 days for recheck BP and BMP with the CMA; encouraged DASH guidelines      Relevant Medications   lisinopril-hydrochlorothiazide (PRINZIDE,ZESTORETIC) 10-12.5 MG tablet     Respiratory   Respiratory failure with hypoxia (HCC)    Chronic issues, uses oxygen at night and PRN during the day; seeing pulmonologist      COPD (chronic obstructive pulmonary disease) ([place]) (Chronic)    Seeing pulmonologist; using oxygen at night and during the day when needed      Relevant Medications   albuterol (PROVENTIL) (2.5 MG/3ML) 0.083% nebulizer solution   Other Relevant Orders   DG Chest 2 View   Allergic rhinitis    Patient says she does not have allergies and the allergy pill is not helping; I advised to stop the loratidine        Endocrine   Impaired fasting glucose (Chronic)    Order A1c to be done next week when she returns for labs      Relevant Orders   Hemoglobin A1c     Other   Medication monitoring encounter   Relevant Orders   CBC   COMPLETE METABOLIC PANEL WITH GFR   Watery eyes    Patient says she will just deal with it; cannot afford to see specialist or have surgery for tear ducts      Severe needle phobia    Planning to do labs next week and she can take anxiety pill prior to draw; she knows to not drive herself when taking that medicine, effects can last up to 6 hours      Relevant Medications   venlafaxine XR (EFFEXOR XR) 37.5 MG 24 hr capsule   Hyperlipidemia (Chronic)    Limit saturated fats      Relevant Medications   lisinopril-hydrochlorothiazide (PRINZIDE,ZESTORETIC) 10-12.5 MG tablet   Other Relevant Orders   Lipid panel   Hypercalcemia    Last level one-tenth of a point high; may be HCTZ, but may be bone turnover, will check iPTH, DEXA, vit D level, phos, chest xray (hx of smoking); patient aware       Relevant Orders   Parathyroid hormone, intact (no Ca)   VITAMIN D 25 Hydroxy (Vit-D Deficiency, Fractures)   Phosphorus   History of tobacco use - Primary   Relevant Orders   DG Chest 2 View   Estrogen deficiency    Ordered DEXA; numbers givne to patient by phone so she can schedule this in the next few weeks; for hot flashes, will stop the clonidine and try low dose effexor; BP check next week; should not be enough of the norepinephrine effect at that low dose      Relevant Orders   DG Bone Density   Abnormal liver function tests    Patient was sent to GI for evaluation; note reviewed; patient is supposed to get Hep A and Hep B vaccination; she has severe needle phobia and is coming back for labs and will take a benzo prior to coming anyway; this need for Hep A/B was discovered AFTER her visit upon records review, so will have staff recommend this when she comes; I also see that patient did not get the hemochromatosis work-up completed, so will order this as well  for labs next week      Relevant Orders   COMPLETE METABOLIC PANEL WITH GFR    Other Visit Diagnoses    Elevated liver function tests       Elevated ferritin       Relevant Orders   Ferritin   Hemochromatosis DNA-PCR(c282y,h63d)       Follow up plan: Return in about 1 week (around 04/24/2018) for blood pressure recheck with CMA and labs; see Dr. Sanda Klein in 6 months.  An after-visit summary was printed and given to the patient at [facility].  Please see the patient instructions which may contain other information and recommendations beyond what is mentioned above in the assessment and plan.  Meds ordered this encounter  Medications  . lisinopril-hydrochlorothiazide (PRINZIDE,ZESTORETIC) 10-12.5 MG tablet    Sig: Take 2 tablets by mouth daily.  Marland Kitchen venlafaxine XR (EFFEXOR XR) 37.5 MG 24 hr capsule    Sig: Take 1 capsule (37.5 mg total) by mouth daily with breakfast. For hot flashes    Dispense:  30 capsule    Refill:  0  .  albuterol (PROVENTIL) (2.5 MG/3ML) 0.083% nebulizer solution    Sig: Take 3 mLs (2.5 mg total) by nebulization every 4 (four) hours as needed for wheezing or shortness of breath.    Dispense:  150 mL    Refill:  3    Orders Placed This Encounter  Procedures  . DG Chest 2 View  . DG Bone Density  . CBC  . COMPLETE METABOLIC PANEL WITH GFR  . Lipid panel  . Parathyroid hormone, intact (no Ca)  . VITAMIN D 25 Hydroxy (Vit-D Deficiency, Fractures)  . Phosphorus  . Ferritin  . Hemochromatosis DNA-PCR(c282y,h63d)  . Hemoglobin A1c  I called patient at 6 pm Friday, gave her the number so she can schedule the DEXA scan Please do call to schedule your bone density study; the number to schedule one at either [facility] or [facility] is [phone] or [phone]  I also told her how to have the CXR done

## 2018-04-17 NOTE — Assessment & Plan Note (Signed)
Chronic issues, uses oxygen at night and PRN during the day; seeing pulmonologist

## 2018-04-17 NOTE — Progress Notes (Signed)
Subjective:   Andrea Bell is a 63 y.o. female who presents for an Initial Medicare Annual Wellness Visit.  Review of Systems    Cardiac Risk Factors include: dyslipidemia;hypertension     Objective:    Today's Vitals   04/17/18 0955 04/17/18 0956  BP: (!) 142/104   Pulse: (!) 106   Resp: 16   Temp: (!) 97.5 F (36.4 C)   TempSrc: Oral   SpO2: 99%   Weight: 157 lb 1.6 oz (71.3 kg)   Height: 5\' 6"  (1.676 m)   PainSc:  0-No pain   Body mass index is 25.36 kg/m.  Advanced Directives 04/17/2018 10/09/2016 10/09/2016 04/09/2016 12/13/2015 12/06/2015 11/24/2015  Does Patient Have a Medical Advance Directive? No No No No No No No  Would patient like information on creating a medical advance directive? Yes (MAU/Ambulatory/Procedural Areas - Information given) - - - - No - patient declined information No - patient declined information    Current Medications (verified) Outpatient Encounter Medications as of 04/17/2018  Medication Sig  . albuterol (PROVENTIL HFA;VENTOLIN HFA) 108 (90 Base) MCG/ACT inhaler Inhale 2 puffs into the lungs every 6 (six) hours as needed for wheezing or shortness of breath.  Marland Kitchen albuterol (PROVENTIL) (2.5 MG/3ML) 0.083% nebulizer solution Take 3 mLs (2.5 mg total) by nebulization every 4 (four) hours as needed for wheezing or shortness of breath.  . ALPRAZolam (XANAX) 0.5 MG tablet One or two tablets by mouth one hour prior to procedure, dentist visit, lab draw; no alcohol; do not drive for six hours  . aspirin EC 81 MG tablet Take 81 mg by mouth daily.  Marland Kitchen atorvastatin (LIPITOR) 10 MG tablet Take 1 tablet (10 mg total) by mouth at bedtime.  Marland Kitchen azelastine (OPTIVAR) 0.05 % ophthalmic solution Place 1 drop into both eyes 2 (two) times daily.  . cloNIDine (CATAPRES) 0.1 MG tablet Take 1 tablet (0.1 mg total) by mouth at bedtime. For hot flashes  . docusate sodium (COLACE) 100 MG capsule Take 100 mg by mouth 2 (two) times daily as needed for mild constipation.  Marland Kitchen  lisinopril-hydrochlorothiazide (PRINZIDE,ZESTORETIC) 10-12.5 MG tablet Take 1 tablet by mouth daily.  Marland Kitchen loratadine (CLARITIN) 10 MG tablet Take 1 tablet (10 mg total) by mouth daily as needed for allergies. (this replaces fexofenadine)  . meclizine (ANTIVERT) 25 MG tablet Take 1 tablet (25 mg total) by mouth 3 (three) times daily as needed for dizziness.  . metoprolol succinate (TOPROL-XL) 25 MG 24 hr tablet TAKE 1/2 TABLET EVERY DAY  . montelukast (SINGULAIR) 10 MG tablet Take 1 tablet (10 mg total) by mouth at bedtime.  . OXYGEN Inhale 2 L into the lungs as needed.   . potassium chloride (K-DUR) 10 MEQ tablet Take 1 tablet (10 mEq total) by mouth daily.  Marland Kitchen senna (SENOKOT) 8.6 MG TABS tablet Take 2 tablets by mouth every other day.  . SYMBICORT 160-4.5 MCG/ACT inhaler Inhale 2 puffs into the lungs 2 (two) times daily.  . traZODone (DESYREL) 100 MG tablet TAKE 1 TABLET AT BEDTIME AS NEEDED FOR SLEEP.  Marland Kitchen triamcinolone (NASACORT) 55 MCG/ACT AERO nasal inhaler Place 2 sprays into the nose daily.  . [DISCONTINUED] potassium chloride (K-DUR,KLOR-CON) 10 MEQ tablet TAKE 1 TABLET EVERY DAY  . Olopatadine HCl 0.2 % SOLN INSTILL 1 DROP INTO BOTH EYES EVERY DAY (Patient not taking: Reported on 04/17/2018)  . [DISCONTINUED] SPIRIVA HANDIHALER 18 MCG inhalation capsule    No facility-administered encounter medications on file as of 04/17/2018.  Allergies (verified) Codeine   History: Past Medical History:  Diagnosis Date  . Allergy   . Anemia   . Chronic back pain   . COPD (chronic obstructive pulmonary disease) (HCC)    2l home oxygen  . Dyspnea   . GERD (gastroesophageal reflux disease)   . Headache   . HTN (hypertension)   . Hyperlipidemia   . Insomnia   . Insomnia   . Osteoarthritis   . Osteoporosis   . Oxygen deficiency   . Tachycardia    Past Surgical History:  Procedure Laterality Date  . ABDOMINAL HYSTERECTOMY    . CYST REMOVAL TRUNK    . CYSTECTOMY     back and side of  head  . CYSTOSCOPY N/A 12/13/2015   Procedure: CYSTOSCOPY;  Surgeon: Malachy Mood, MD;  Location: [facility];  Service: Gynecology;  Laterality: N/A;  . LAPAROSCOPIC BILATERAL SALPINGO OOPHERECTOMY Bilateral 12/13/2015   Procedure: LAPAROSCOPIC BILATERAL SALPINGO OOPHORECTOMY;  Surgeon: Malachy Mood, MD;  Location: [facility] OR[facility];  Service: Gynecology;  Laterality: Bilateral;  . LAPAROSCOPIC HYSTERECTOMY N/A 12/13/2015   Procedure: HYSTERECTOMY TOTAL LAPAROSCOPIC;  Surgeon: Malachy Mood, MD;  Location: [facility] OR[facility];  Service: Gynecology;  Laterality: N/A;  . tear duct surgery     bilateral  . TUBAL LIGATION     Family History  Problem Relation Age of Onset  . Hypertension Mother   . Diabetes Mellitus II Mother   . Dementia Mother   . Diabetes Mother   . Cancer - Other Father        brain tumor  . Cancer Father        brain tumor  . Breast cancer Maternal Aunt 57  . Cancer Maternal Aunt        ovarian  . Breast cancer Maternal Grandmother 37  . Cancer Maternal Grandmother        breast  . Diabetes Brother   . Hypertension Brother   . COPD Brother   . Cancer Maternal Grandfather        stomach  . Heart disease Paternal Grandmother   . Stroke Paternal Grandfather    Social History   Socioeconomic History  . Marital status: Married    Spouse name: Not on file  . Number of children: 2  . Years of education: Not on file  . Highest education level: 12th grade  Occupational History  . Not on file  Social Needs  . Financial resource strain: Not hard at all  . Food insecurity:    Worry: Never true    Inability: Never true  . Transportation needs:    Medical: No    Non-medical: No  Tobacco Use  . Smoking status: Former Smoker    Packs/day: 0.50    Years: 15.00    Pack years: 7.50    Types: Cigarettes    Last attempt to quit: 04/25/2011    Years since quitting: 6.9  . Smokeless tobacco: Never Used  Substance and Sexual Activity  . Alcohol use: Yes    Alcohol/week:  0.0 standard drinks    Comment: occasional drinks  . Drug use: No  . Sexual activity: Yes  Lifestyle  . Physical activity:    Days per week: 0 days    Minutes per session: 0 min  . Stress: To some extent  Relationships  . Social connections:    Talks on phone: More than three times a week    Gets together: More than three times a week    Attends  religious service: More than 4 times per year    Active member of club or organization: No    Attends meetings of clubs or organizations: Never    Relationship status: Married  Other Topics Concern  . Not on file  Social History Narrative   Lives at home with husband.    Tobacco Counseling Counseling given: Not Answered   Clinical Intake:  Pre-visit preparation completed: Yes  Pain : No/denies pain Pain Score: 0-No pain     BMI - recorded: 25.36 Nutritional Status: BMI 25 -29 Overweight Nutritional Risks: None(diarrhea) Diabetes: No  How often do you need to have someone help you when you read instructions, pamphlets, or other written materials from your doctor or pharmacy?: 1 - Never  Interpreter Needed?: No  Information entered by :: Clemetine Marker LPN   Activities of Daily Living In your present state of health, do you have any difficulty performing the following activities: 04/17/2018 10/10/2017  Hearing? N N  Comment declines hearing aids -  Vision? N N  Comment reading glasses -  Difficulty concentrating or making decisions? N N  Walking or climbing stairs? N N  Dressing or bathing? N N  Doing errands, shopping? N N  Preparing Food and eating ? N -  Using the Toilet? N -  In the past six months, have you accidently leaked urine? N -  Do you have problems with loss of bowel control? N -  Managing your Medications? N -  Managing your Finances? N -  Housekeeping or managing your Housekeeping? N -  Some recent data might be hidden     Immunizations and Health Maintenance Immunization History  Administered  Date(s) Administered  . Influenza,inj,Quad PF,6+ Mos 12/27/2014, 04/09/2016, 04/11/2017  . Td 02/20/2012   Health Maintenance Due  Topic Date Due  . COLONOSCOPY  02/19/2018    Patient Care Team: Lada, Satira Anis, MD as PCP - General (Family Medicine) Erby Pian, MD as Referring Physician (Pulmonary Disease)  Indicate any recent Medical Services you may have received from other than [facility] providers in the past year (date may be approximate).     Assessment:   This is a routine wellness examination for [place].  Hearing/Vision screen Hearing Screening Comments: Pt has no difficulty hearing  Vision Screening Comments: Pt is past due for eye exam. Established at [place] in [facility] with Dr. Wyatt Portela  Dietary issues and exercise activities discussed: Current Exercise Habits: The patient does not participate in regular exercise at present, Exercise limited by: None identified  Goals    . Weight (lb) < 140 lb (63.5 kg)     Pt would like to lose 15-20 more lbs.       Depression Screen PHQ 2/9 Scores 04/17/2018 10/10/2017 05/09/2017 04/11/2017 10/09/2016 04/09/2016 11/24/2015  PHQ - 2 Score 0 0 0 0 0 0 1    Fall Risk Fall Risk  04/17/2018 10/10/2017 05/09/2017 04/11/2017 10/09/2016  Falls in the past year? 0 No No No No  Number falls in past yr: 0 - - - -  Injury with Fall? 0 - - - -  Follow up Falls prevention discussed - - - -    FALL RISK PREVENTION PERTAINING TO THE HOME:  Any stairs in or around the home? Yes  If so, do they handrails? Yes   Home free of loose throw rugs in walkways, pet beds, electrical cords, etc? Yes  Adequate lighting in your home to reduce risk of falls? Yes   ASSISTIVE  DEVICES UTILIZED TO PREVENT FALLS:  Life alert? No  Use of a cane, walker or w/c? No  Grab bars in the bathroom? No  Shower chair or bench in shower? No  Elevated toilet seat or a handicapped toilet? No   DME ORDERS:  DME order needed?  No   TIMED UP AND GO:  Was the test  performed? Yes .  Length of time to ambulate 10 feet: 6 sec.   GAIT:  Appearance of gait: Gait stead-fast and without the use of an assistive device.  Education: Fall risk prevention has been discussed.  Intervention(s) required? No    Cognitive Function:     6CIT Screen 04/17/2018  What Year? 0 points  What month? 0 points  What time? 0 points  Count back from 20 0 points  Months in reverse 0 points  Repeat phrase 0 points  Total Score 0    Screening Tests Health Maintenance  Topic Date Due  . COLONOSCOPY  02/19/2018  . INFLUENZA VACCINE  05/30/2018 (Originally 09/19/2017)  . PAP SMEAR-Modifier  08/30/2018  . MAMMOGRAM  12/05/2018  . TETANUS/TDAP  02/19/2022  . Hepatitis C Screening  Completed  . HIV Screening  Completed    Qualifies for Shingles Vaccine? Yes  . Due for Shingrix. Education has been provided regarding the importance of this vaccine. Pt has been advised to call insurance company to determine out of pocket expense. Advised may also receive vaccine at local pharmacy or Health Dept. Verbalized acceptance and understanding.  Tdap: Up to date  Flu Vaccine: Due for Flu vaccine. Does the patient want to receive this vaccine today?  No . Education has been provided regarding the importance of this vaccine but still declined. Advised may receive this vaccine at local pharmacy or Health Dept. Aware to provide a copy of the vaccination record if obtained from local pharmacy or Health Dept. Verbalized acceptance and understanding.  Pneumococcal Vaccine: Due for Pneumococcal vaccine. Does the patient want to receive this vaccine today?  No . Education has been provided regarding the importance of this vaccine but still declined. Advised may receive this vaccine at local pharmacy or Health Dept. Aware to provide a copy of the vaccination record if obtained from local pharmacy or Health Dept. Verbalized acceptance and understanding.   Cancer Screenings:  Colorectal  Screening: Completed by [facility] per patient but does not reacall the year. Requesting records and pt aware will send referral if she is due for repeat screening. .  Mammogram: Completed 12/04/17. Repeat every year;   Bone Density:  Due age 77  Lung Cancer Screening: (Low Dose CT Chest recommended if Age 36-80 years, 30 pack-year currently smoking OR have quit w/in 15years.) does not qualify.    Additional Screening:  Hepatitis C Screening: does qualify; Completed 11/01/17  Vision Screening: Recommended annual ophthalmology exams for early detection of glaucoma and other disorders of the eye. Is the patient up to date with their annual eye exam?  No  Who is the provider or what is the name of the office in which the pt attends annual eye exams? Dr. Wyatt Portela  Dental Screening: Recommended annual dental exams for proper oral hygiene  Community Resource Referral:  CRR required this visit?  No      Plan:    I have personally reviewed and addressed the Medicare Annual Wellness questionnaire and have noted the following in the patient's chart:  A. Medical and social history B. Use of alcohol, tobacco or illicit drugs  C. Current medications and supplements D. Functional ability and status E.  Nutritional status F.  Physical activity G. Advance directives H. List of other physicians I.  Hospitalizations, surgeries, and ER visits in previous 12 months J.  [place] such as hearing and vision if needed, cognitive and depression L. Referrals and appointments   In addition, I have reviewed and discussed with patient certain preventive protocols, quality metrics, and best practice recommendations. A written personalized care plan for preventive services as well as general preventive health recommendations were provided to patient.   Signed,  Clemetine Marker, LPN Nurse Health Advisor   Nurse Notes: pt presented to [facility] after taking alprazolam for scheduled blood work  today. Pt also seeing Dr. Sanda Klein today. BP to be repeated.

## 2018-04-17 NOTE — Assessment & Plan Note (Signed)
Glad that she has really cut back on salt; increase ACE-I ; thiazide combo; return in 7-10 days for recheck BP and BMP with the [facility]; encouraged DASH guidelines

## 2018-04-18 DIAGNOSIS — E2839 Other primary ovarian failure: Secondary | ICD-10-CM | POA: Insufficient documentation

## 2018-04-18 DIAGNOSIS — Z87891 Personal history of nicotine dependence: Secondary | ICD-10-CM | POA: Insufficient documentation

## 2018-04-18 MED ORDER — ALBUTEROL SULFATE (2.5 MG/3ML) 0.083% IN NEBU: 2.5000 mg | INHALATION_SOLUTION | RESPIRATORY_TRACT | 3 refills | Status: AC | PRN

## 2018-04-18 NOTE — Assessment & Plan Note (Addendum)
Patient was sent to GI for evaluation; note reviewed; patient is supposed to get Hep A and Hep B vaccination; she has severe needle phobia and is coming back for labs and will take a benzo prior to coming anyway; this need for Hep A/B was discovered AFTER her visit upon records review, so will have staff recommend this when she comes; I also see that patient did not get the hemochromatosis work-up completed, so will order this as well for labs next week

## 2018-04-18 NOTE — Assessment & Plan Note (Signed)
Order A1c to be done next week when she returns for labs

## 2018-04-18 NOTE — Assessment & Plan Note (Addendum)
Ordered DEXA; numbers givne to patient by phone so she can schedule this in the next few weeks; for hot flashes, will stop the clonidine and try low dose effexor; BP check next week; should not be enough of the norepinephrine effect at that low dose

## 2018-04-18 NOTE — Assessment & Plan Note (Signed)
Last level one-tenth of a point high; may be HCTZ, but may be bone turnover, will check iPTH, DEXA, vit D level, phos, chest xray (hx of smoking); patient aware

## 2018-04-20 DIAGNOSIS — J449 Chronic obstructive pulmonary disease, unspecified: Secondary | ICD-10-CM | POA: Diagnosis not present

## 2018-04-21 NOTE — Addendum Note (Signed)
Addended by: Docia Furl on: 04/21/2018 09:32 AM   Modules accepted: Orders

## 2018-04-22 ENCOUNTER — Telehealth: Payer: Self-pay

## 2018-04-22 ENCOUNTER — Other Ambulatory Visit: Payer: Self-pay

## 2018-04-22 DIAGNOSIS — Z1211 Encounter for screening for malignant neoplasm of colon: Secondary | ICD-10-CM

## 2018-04-22 NOTE — Telephone Encounter (Signed)
Gastroenterology Pre-Procedure Review  Request Date: 05/09/18 Requesting Physician: Dr. Allen Norris  PATIENT REVIEW QUESTIONS: The patient responded to the following health history questions as indicated:    1. Are you having any GI issues? no 2. Do you have a personal history of Polyps? no 3. Do you have a family history of Colon Cancer or Polyps? no 4. Diabetes Mellitus? no 5. Joint replacements in the past 12 months?no 6. Major health problems in the past 3 months?no 7. Any artificial heart valves, MVP, or defibrillator?no    MEDICATIONS & ALLERGIES:    Patient reports the following regarding taking any anticoagulation/antiplatelet therapy:   Plavix, Coumadin, Eliquis, Xarelto, Lovenox, Pradaxa, Brilinta, or Effient? no Aspirin? no  Patient confirms/reports the following medications:  Current Outpatient Medications  Medication Sig Dispense Refill  . albuterol (PROVENTIL HFA;VENTOLIN HFA) 108 (90 Base) MCG/ACT inhaler Inhale 2 puffs into the lungs every 6 (six) hours as needed for wheezing or shortness of breath. 1 Inhaler 3  . albuterol (PROVENTIL) (2.5 MG/3ML) 0.083% nebulizer solution Take 3 mLs (2.5 mg total) by nebulization every 4 (four) hours as needed for wheezing or shortness of breath. 150 mL 3  . ALPRAZolam (XANAX) 0.5 MG tablet One or two tablets by mouth one hour prior to procedure, dentist visit, lab draw; no alcohol; do not drive for six hours 6 tablet 2  . aspirin EC 81 MG tablet Take 81 mg by mouth daily.    Marland Kitchen atorvastatin (LIPITOR) 10 MG tablet Take 1 tablet (10 mg total) by mouth at bedtime. 90 tablet 1  . lisinopril-hydrochlorothiazide (PRINZIDE,ZESTORETIC) 10-12.5 MG tablet Take 2 tablets by mouth daily.    . meclizine (ANTIVERT) 25 MG tablet Take 1 tablet (25 mg total) by mouth 3 (three) times daily as needed for dizziness. 30 tablet 2  . metoprolol succinate (TOPROL-XL) 25 MG 24 hr tablet TAKE 1/2 TABLET EVERY DAY 45 tablet 3  . OXYGEN Inhale 2 L into the lungs as  needed.     . potassium chloride (K-DUR) 10 MEQ tablet Take 1 tablet (10 mEq total) by mouth daily. 90 tablet 1  . senna (SENOKOT) 8.6 MG TABS tablet Take 2 tablets by mouth every other day.    . SYMBICORT 160-4.5 MCG/ACT inhaler Inhale 2 puffs into the lungs 2 (two) times daily. 3 Inhaler 3  . traZODone (DESYREL) 100 MG tablet TAKE 1 TABLET AT BEDTIME AS NEEDED FOR SLEEP. 90 tablet 1  . venlafaxine XR (EFFEXOR XR) 37.5 MG 24 hr capsule Take 1 capsule (37.5 mg total) by mouth daily with breakfast. For hot flashes 30 capsule 0   No current facility-administered medications for this visit.     Patient confirms/reports the following allergies:  Allergies  Allergen Reactions  . Codeine Nausea And Vomiting    Was used while in hospital 02/24/14 and no reaction noted--01/06/15 BC    No orders of the defined types were placed in this encounter.   AUTHORIZATION INFORMATION Primary Insurance: 1D#: Group #:  Secondary Insurance: 1D#: Group #:  SCHEDULE INFORMATION: Date: 05/09/18 Time: Location:[facility]

## 2018-04-22 NOTE — Telephone Encounter (Signed)
-----   Message from Arnetha Courser, MD sent at 04/18/2018  6:24 PM EST ----- [facility] staff -- patient needs hepatitis A and B vaccines; please see GI note from Sept 2019; she'll have benzo on board for her labs so let her know we'd like her to get this vaccine too; make sure we have it ready in house for her; she may not come back Patient needs colonoscopy; please REFER Labs printed out for 1st week of March, hemochromatosis part of that panel as well as ferritin Dr. Bonna Gains, I'm getting another ferritin and ordered the hemochromatosis DNR PCR which didn't get done in the Fall; I'll send you results

## 2018-04-22 NOTE — Telephone Encounter (Signed)
Andrea Bell has already contacted patient, referral has been entered.

## 2018-04-24 ENCOUNTER — Ambulatory Visit (INDEPENDENT_AMBULATORY_CARE_PROVIDER_SITE_OTHER): Payer: Medicare HMO

## 2018-04-24 DIAGNOSIS — E782 Mixed hyperlipidemia: Secondary | ICD-10-CM | POA: Diagnosis not present

## 2018-04-24 DIAGNOSIS — R7989 Other specified abnormal findings of blood chemistry: Secondary | ICD-10-CM | POA: Diagnosis not present

## 2018-04-24 DIAGNOSIS — Z5181 Encounter for therapeutic drug level monitoring: Secondary | ICD-10-CM | POA: Diagnosis not present

## 2018-04-24 DIAGNOSIS — R7301 Impaired fasting glucose: Secondary | ICD-10-CM | POA: Diagnosis not present

## 2018-04-24 DIAGNOSIS — Z23 Encounter for immunization: Secondary | ICD-10-CM

## 2018-04-24 DIAGNOSIS — R945 Abnormal results of liver function studies: Secondary | ICD-10-CM | POA: Diagnosis not present

## 2018-04-24 NOTE — Progress Notes (Signed)
Andrea Bell, please let the patient know that her CBC has come back down to normal

## 2018-04-25 ENCOUNTER — Other Ambulatory Visit: Payer: Self-pay | Admitting: Family Medicine

## 2018-04-25 ENCOUNTER — Other Ambulatory Visit
Admission: RE | Admit: 2018-04-25 | Discharge: 2018-04-25 | Disposition: A | Discharge status: Home / Self Care | Payer: Medicare HMO | Attending: Family Medicine | Admitting: Family Medicine

## 2018-04-25 DIAGNOSIS — R0689 Other abnormalities of breathing: Secondary | ICD-10-CM

## 2018-04-25 DIAGNOSIS — E559 Vitamin D deficiency, unspecified: Secondary | ICD-10-CM

## 2018-04-25 DIAGNOSIS — M810 Age-related osteoporosis without current pathological fracture: Secondary | ICD-10-CM

## 2018-04-25 DIAGNOSIS — Z87891 Personal history of nicotine dependence: Secondary | ICD-10-CM

## 2018-04-25 DIAGNOSIS — E876 Hypokalemia: Secondary | ICD-10-CM

## 2018-04-25 DIAGNOSIS — E213 Hyperparathyroidism, unspecified: Secondary | ICD-10-CM

## 2018-04-25 LAB — MAGNESIUM: Magnesium: 1.8 mg/dL (ref 1.7–2.4)

## 2018-04-25 LAB — BASIC METABOLIC PANEL
Anion gap: 10 (ref 5–15)
BUN: 20 mg/dL (ref 8–23)
CO2: 30 mmol/L (ref 22–32)
Calcium: 10.1 mg/dL (ref 8.9–10.3)
Chloride: 101 mmol/L (ref 98–111)
Creatinine, Ser: 0.57 mg/dL (ref 0.44–1.00)
GFR calc non Af Amer: >60 mL/min (ref >60)
Glucose, Bld: 113 mg/dL — ABNORMAL HIGH (ref 70–99)
Potassium: 3 mmol/L — ABNORMAL LOW (ref 3.5–5.1)
Sodium: 141 mmol/L (ref 135–145)

## 2018-04-25 LAB — CHLORIDE, URINE, RANDOM: Chloride Urine: 208 mmol/L

## 2018-04-25 MED ORDER — AMLODIPINE BESYLATE 5 MG PO TABS: 5.0000 mg | ORAL_TABLET | Freq: Two times a day (BID) | ORAL | 0 refills | Status: DC

## 2018-04-25 MED ORDER — VITAMIN D (ERGOCALCIFEROL) 1.25 MG (50000 UNIT) PO CAPS: 50000.0000 [IU] | ORAL_CAPSULE | ORAL | 0 refills | Status: DC

## 2018-04-25 MED ORDER — LOSARTAN POTASSIUM 50 MG PO TABS: 50.0000 mg | ORAL_TABLET | Freq: Every day | ORAL | 0 refills | Status: DC

## 2018-04-25 MED ORDER — AMLODIPINE BESYLATE 5 MG PO TABS: 5.0000 mg | ORAL_TABLET | Freq: Every day | ORAL | 0 refills | Status: DC

## 2018-04-25 MED ORDER — BLOOD PRESSURE KIT: PACK | 0 refills | Status: DC

## 2018-04-25 NOTE — Progress Notes (Signed)
Take amlo 5 bid; reviewed her previous BP readings and she's been in the 140s; first dose already taken tonight she says Check bp 1-2x; Rx for BP cuff sent Monday or Tues with me for appointment Okay to continue 2 l/m if feeling good, since CO2 was normal; PA suggested 1.5 l/m based on the CO2 of 38 but CO2 is normal now; I'll send note to Dr. Raul Del Potassium low at 3.0 Take two potassium pills now Two pills in am instead of just one Two Sunday am then back to normal She is off of ACE-I and thiazide so I hope to replace her and then stop potassium if aldosterone is normal I ran through the list of s/s of alkalosis, reasons to go to the ER She thanked me for my call

## 2018-04-25 NOTE — Progress Notes (Signed)
I talked to patient about multiple abnormal labs CO2 is high at 38 I'll stop her diuretic --> patient advised to STOP  She hates to have blood drawn so we'll do amlodipine instead of an ACE-I or ARB I called [employer], canceled her ACE-I thiazide come and her K+ pills to avoid those going chronically  Stay on metoprolol Start amlodipine BP  Send to hospital to check urine chloride, recheck BMP Calling pulm to see if anything he thinks might be going on from pulmonary standpoint w/her severe COPD  Confusion (can progress to stupor or coma) - NO Hand tremor YES, not really new Lightheadedness YES, not new Muscle twitching NO Nausea, vomiting NO Excessive diarrhea NO Numbness or tingling in the face, hands, or feet NO Prolonged muscle spasms (tetany) NO Fever NO High elevation NO Using oxygen YES, 2 liters/minute Hyperventilation YES, she thinks she was huffing and puffing and hyperventilating, they kept telling her to breathe and breathe she says when they were drawing her blood Go to the ER over the weekend  I spoke with pulmonologist on-call, Daylene Posey for Dr. Raul Del, reviewed case, labs with him Breathing sometimes worse, was having SHOB when the blood was drawn He recommends decreasing her oxygen to 1.5 l/m and see her next week Pulm appt next week; they will call her for an appointment Patient agrees and will make sure that happens  Chest xray and labs (blood and urine) now at the hospital Take a pill to relax to avoid hyperventilate; no alcohol for six hours after sleepy pills (benzo) I explained, can go to sleep and not wake up; she verbalized understanding  I called Kent at [employer] to explain the plan, stopping lisinopril-hctz, do NOT fill the losartan; we'll use amlo instead; also sending him vit D Rx I called [employer], canceled the vit D; canceled outstanding lisinpril-hct; stopping KCl STAT CXR ordered with my number to call

## 2018-04-28 ENCOUNTER — Other Ambulatory Visit: Payer: Self-pay | Admitting: Family Medicine

## 2018-04-28 ENCOUNTER — Ambulatory Visit
Admission: RE | Admit: 2018-04-28 | Discharge: 2018-04-28 | Disposition: A | Discharge status: Home / Self Care | Payer: Medicare HMO | Source: Ambulatory Visit | Attending: Family Medicine | Admitting: Family Medicine

## 2018-04-28 ENCOUNTER — Other Ambulatory Visit: Payer: Self-pay

## 2018-04-28 ENCOUNTER — Ambulatory Visit
Admission: RE | Admit: 2018-04-28 | Discharge: 2018-04-28 | Disposition: A | Discharge status: Home / Self Care | Payer: Medicare HMO | Attending: Family Medicine | Admitting: Family Medicine

## 2018-04-28 ENCOUNTER — Telehealth: Payer: Self-pay | Admitting: Family Medicine

## 2018-04-28 DIAGNOSIS — Z87891 Personal history of nicotine dependence: Secondary | ICD-10-CM | POA: Insufficient documentation

## 2018-04-28 DIAGNOSIS — R0609 Other forms of dyspnea: Secondary | ICD-10-CM | POA: Diagnosis not present

## 2018-04-28 DIAGNOSIS — Z9981 Dependence on supplemental oxygen: Secondary | ICD-10-CM | POA: Diagnosis not present

## 2018-04-28 DIAGNOSIS — E876 Hypokalemia: Secondary | ICD-10-CM | POA: Diagnosis not present

## 2018-04-28 DIAGNOSIS — R0689 Other abnormalities of breathing: Secondary | ICD-10-CM | POA: Insufficient documentation

## 2018-04-28 DIAGNOSIS — R0602 Shortness of breath: Secondary | ICD-10-CM | POA: Diagnosis not present

## 2018-04-28 DIAGNOSIS — R918 Other nonspecific abnormal finding of lung field: Secondary | ICD-10-CM

## 2018-04-28 DIAGNOSIS — J449 Chronic obstructive pulmonary disease, unspecified: Secondary | ICD-10-CM | POA: Diagnosis not present

## 2018-04-28 NOTE — Telephone Encounter (Signed)
Message left for patient I understand she saw Dr. Raul Del this morning He and I talked about her CXR Area of distortion and radiologist recommends a CT to get a better picture I have ordered that Expect a call from scheduling in the next few days Call with questions

## 2018-04-28 NOTE — Telephone Encounter (Signed)
Abnormal chest xray  I called her lung doctor to see if he agrees with low dose noncontrast chest CT or would prefer regular contrast chest CT  Patient is coming in to see Dr. Raul Del at 11 am today  Cell number given and he can call me back about whether or not to proceed as recommended by radiologist or does she need contrast CT

## 2018-04-29 ENCOUNTER — Ambulatory Visit (INDEPENDENT_AMBULATORY_CARE_PROVIDER_SITE_OTHER): Payer: Medicare HMO | Admitting: Family Medicine

## 2018-04-29 ENCOUNTER — Encounter: Payer: Self-pay | Admitting: Family Medicine

## 2018-04-29 VITALS — BP 120/78 | HR 95 | Temp 98.0°F | Resp 12 | Ht 66.0 in | Wt 164.9 lb

## 2018-04-29 DIAGNOSIS — R9389 Abnormal findings on diagnostic imaging of other specified body structures: Secondary | ICD-10-CM

## 2018-04-29 DIAGNOSIS — J9611 Chronic respiratory failure with hypoxia: Secondary | ICD-10-CM | POA: Diagnosis not present

## 2018-04-29 DIAGNOSIS — I10 Essential (primary) hypertension: Secondary | ICD-10-CM | POA: Diagnosis not present

## 2018-04-29 DIAGNOSIS — E876 Hypokalemia: Secondary | ICD-10-CM | POA: Diagnosis not present

## 2018-04-29 MED ORDER — AMLODIPINE BESYLATE 5 MG PO TABS: 5.0000 mg | ORAL_TABLET | Freq: Two times a day (BID) | ORAL | 5 refills | Status: DC

## 2018-04-29 NOTE — Patient Instructions (Addendum)
Continue the amlodipine twice a day Continue the vitamin D until prescription is finishing Do call the place to schedule your bone density Return on Thursday for lab check Do NOT take any potassium for now; we'll see what your labs show We'll get the scan Try to follow the DASH guidelines (DASH stands for Dietary Approaches to Stop Hypertension). Try to limit the sodium in your diet to no more than 1,500mg  of sodium per day. Certainly try to not exceed 2,000 mg per day at the very most. Do not add salt when cooking or at the table.  Check the sodium amount on labels when shopping, and choose items lower in sodium when given a choice. Avoid or limit foods that already contain a lot of sodium. Eat a diet rich in fruits and vegetables and whole grains, and try to lose weight if overweight or obese

## 2018-04-29 NOTE — Progress Notes (Signed)
BP 120/78   Pulse 95   Temp 98 F (36.7 C)   Resp 12   Ht 5\' 6"  (1.676 m)   Wt 164 lb 14.4 oz (74.8 kg)   LMP 10/27/2015 Comment: had hysterctomy  BMI 26.62 kg/m    Subjective:    Patient ID: Andrea Bell, female    DOB: May 16, 1955, 63 y.o.   MRN: [id number]  HPI: Andrea Bell is a 63 y.o. female  Chief Complaint  Patient presents with  . Follow-up    HPI Patient is here for f/u She saw Dr. Raul Del and he thought every thing looked good; he agreed that CO2 was from being anxious Still on oxygen at night; uses PRN Abnormal CXR; he agreed with CT scan CLINICAL DATA:  Shortness of breath. COPD. Hypercarbia.  EXAM: CHEST - 2 VIEW  COMPARISON:  01/22/2015  FINDINGS: Cardiomediastinal silhouette is normal. Mediastinal contours appear intact.  There is no evidence of focal airspace consolidation, pleural effusion or pneumothorax. Mild peribronchial thickening bilaterally. Architectural distortion in the right infrahilar region.  Osseous structures are without acute abnormality. Soft tissues are grossly normal.  IMPRESSION: 1. Mild peribronchial thickening bilaterally usually associated with bronchitis or reactive airway disease. 2. Architectural distortion in the right infrahilar region, with which pulmonary nodule can not be excluded. If further imaging evaluation is desired, low-dose noncontrasted chest CT may be considered.   Electronically Signed   By: Fidela Salisbury M.D.   On: 04/28/2018 10:05  HTN; well-controlled; no swelling on the amlodipine She is trying real hard to avoid salt  She was found to have low vitamin D; taking once a week vit D supplementation  Depression screen [facility] 2/9 04/29/2018 04/17/2018 10/10/2017 05/09/2017 04/11/2017  Decreased Interest 0 0 0 0 0  Down, Depressed, Hopeless 0 0 0 0 0  PHQ - 2 Score 0 0 0 0 0  Altered sleeping 0 - - - -  Tired, decreased energy 0 - - - -  Change in appetite 0 - - - -  Feeling bad  or failure about yourself  0 - - - -  Trouble concentrating 0 - - - -  Moving slowly or fidgety/restless 0 - - - -  Suicidal thoughts 0 - - - -  PHQ-9 Score 0 - - - -  Difficult doing work/chores Not difficult at all - - - -  MD note: not depressed  Fall Risk  04/29/2018 04/17/2018 10/10/2017 05/09/2017 04/11/2017  Falls in the past year? 0 0 No No No  Number falls in past yr: - 0 - - -  Injury with Fall? - 0 - - -  Follow up - Falls prevention discussed - - -    Relevant past medical, surgical, family and social history reviewed Past Medical History:  Diagnosis Date  . Allergy   . Anemia   . Chronic back pain   . Complication of anesthesia    slow to wake  . COPD (chronic obstructive pulmonary disease) (HCC)    2l home oxygen  . Dyspnea   . GERD (gastroesophageal reflux disease)   . Headache   . HTN (hypertension)   . Hyperlipidemia   . Insomnia   . Insomnia   . Osteoarthritis   . Osteoporosis   . Oxygen deficiency   . Tachycardia   . Vertigo    none recently  . Wears dentures    partial lower   Past Surgical History:  Procedure Laterality Date  . ABDOMINAL HYSTERECTOMY    .  CYST REMOVAL TRUNK    . CYSTECTOMY     back and side of head  . CYSTOSCOPY N/A 12/13/2015   Procedure: CYSTOSCOPY;  Surgeon: Malachy Mood, MD;  Location: [facility] ORS;  Service: Gynecology;  Laterality: N/A;  . LAPAROSCOPIC BILATERAL SALPINGO OOPHERECTOMY Bilateral 12/13/2015   Procedure: LAPAROSCOPIC BILATERAL SALPINGO OOPHORECTOMY;  Surgeon: Malachy Mood, MD;  Location: [facility] ORS;  Service: Gynecology;  Laterality: Bilateral;  . LAPAROSCOPIC HYSTERECTOMY N/A 12/13/2015   Procedure: HYSTERECTOMY TOTAL LAPAROSCOPIC;  Surgeon: Malachy Mood, MD;  Location: [facility] ORS;  Service: Gynecology;  Laterality: N/A;  . tear duct surgery     bilateral  . TUBAL LIGATION     Family History  Problem Relation Age of Onset  . Hypertension Mother   . Diabetes Mellitus II Mother   . Dementia Mother   .  Diabetes Mother   . Cancer - Other Father        brain tumor  . Cancer Father        brain tumor  . Breast cancer Maternal Aunt 57  . Cancer Maternal Aunt        ovarian  . Breast cancer Maternal Grandmother 29  . Cancer Maternal Grandmother        breast  . Diabetes Brother   . Hypertension Brother   . COPD Brother   . Cancer Maternal Grandfather        stomach  . Heart disease Paternal Grandmother   . Stroke Paternal Grandfather    Social History   Tobacco Use  . Smoking status: Former Smoker    Packs/day: 0.50    Years: 15.00    Pack years: 7.50    Types: Cigarettes    Last attempt to quit: 04/25/2011    Years since quitting: 7.0  . Smokeless tobacco: Never Used  Substance Use Topics  . Alcohol use: Yes    Alcohol/week: 7.0 standard drinks    Types: 7 Shots of liquor per week    Comment: occasional drinks  . Drug use: No     Office Visit from 04/29/2018 in [facility] [place]  AUDIT-C Score  2      Interim medical history since last visit reviewed. Allergies and medications reviewed  Review of Systems Per HPI unless specifically indicated above     Objective:    BP 120/78   Pulse 95   Temp 98 F (36.7 C)   Resp 12   Ht 5\' 6"  (1.676 m)   Wt 164 lb 14.4 oz (74.8 kg)   LMP 10/27/2015 Comment: had hysterctomy  BMI 26.62 kg/m   Wt Readings from Last 3 Encounters:  04/29/18 164 lb 14.4 oz (74.8 kg)  04/17/18 166 lb (75.3 kg)  04/17/18 157 lb 1.6 oz (71.3 kg)    Physical Exam Constitutional:      Appearance: She is well-developed.  Eyes:     General: No scleral icterus. Cardiovascular:     Rate and Rhythm: Normal rate and regular rhythm.  Pulmonary:     Effort: Pulmonary effort is normal.     Breath sounds: Normal breath sounds.  Psychiatric:        Behavior: Behavior normal.         Assessment & Plan:   Problem List Items Addressed This Visit      Cardiovascular and Mediastinum   Essential hypertension, benign (Chronic)     Thiazide diuretic has been stopped; controlled now with current meds; she is trying to adhere to DASH guidelines  Relevant Medications   amLODipine (NORVASC) 5 MG tablet     Respiratory   Respiratory failure with hypoxia (HCC)    Chronic need for supplemental oxygen, seeing pulmonologist       Other Visit Diagnoses    Hypokalemia    -  Primary   stopped the thiazide diuretic; should improve now without having to take additional K+ supplementation; recheck on Thursday   Relevant Orders   BASIC METABOLIC PANEL WITH GFR   Abnormal chest x-ray       chest CT has already been ordered; she is aware, discussed with Dr. Raul Del       Follow up plan: No follow-ups on file.  An after-visit summary was printed and given to the patient at [facility].  Please see the patient instructions which may contain other information and recommendations beyond what is mentioned above in the assessment and plan.  Meds ordered this encounter  Medications  . amLODipine (NORVASC) 5 MG tablet    Sig: Take 1 tablet (5 mg total) by mouth 2 (two) times daily. This is for blood pressure, do not take lisinopril-hct    Dispense:  60 tablet    Refill:  5    Orders Placed This Encounter  Procedures  . BASIC METABOLIC PANEL WITH GFR

## 2018-04-30 ENCOUNTER — Other Ambulatory Visit: Payer: Self-pay

## 2018-04-30 ENCOUNTER — Encounter: Payer: Self-pay | Admitting: Anesthesiology

## 2018-04-30 ENCOUNTER — Encounter: Payer: Self-pay | Admitting: *Deleted

## 2018-04-30 LAB — COMPLETE METABOLIC PANEL WITH GFR
AG Ratio: 1.8 (calc) (ref 1.0–2.5)
ALBUMIN MSPROF: 4.4 g/dL (ref 3.6–5.1)
ALT: 48 U/L — ABNORMAL HIGH (ref 6–29)
AST: 50 U/L — ABNORMAL HIGH (ref 10–35)
Alkaline phosphatase (APISO): 68 U/L (ref 37–153)
BUN: 17 mg/dL (ref 7–25)
CALCIUM: 10.7 mg/dL — AB (ref 8.6–10.4)
CO2: 38 mmol/L — AB (ref 20–32)
CREATININE: 0.82 mg/dL (ref 0.50–0.99)
Chloride: 96 mmol/L — ABNORMAL LOW (ref 98–110)
GFR, Est African American: 89 mL/min/{1.73_m2} (ref >=60)
GFR, Est Non African American: 77 mL/min/{1.73_m2} (ref >=60)
Globulin: 2.5 g/dL (calc) (ref 1.9–3.7)
Glucose, Bld: 102 mg/dL — ABNORMAL HIGH (ref 65–99)
Potassium: 3.4 mmol/L — ABNORMAL LOW (ref 3.5–5.3)
Sodium: 143 mmol/L (ref 135–146)
TOTAL PROTEIN: 6.9 g/dL (ref 6.1–8.1)
Total Bilirubin: 0.6 mg/dL (ref 0.2–1.2)

## 2018-04-30 LAB — LIPID PANEL
CHOL/HDL RATIO: 3.8 (calc) (ref <5.0)
CHOLESTEROL: 194 mg/dL (ref <200)
HDL: 51 mg/dL (ref >=50)
LDL CHOLESTEROL (CALC): 119 mg/dL — AB
Non-HDL Cholesterol (Calc): 143 mg/dL (calc) — ABNORMAL HIGH (ref <130)
Triglycerides: 128 mg/dL (ref <150)

## 2018-04-30 LAB — CBC
HCT: 42.8 % (ref 35.0–45.0)
HEMOGLOBIN: 14.8 g/dL (ref 11.7–15.5)
MCH: 32.6 pg (ref 27.0–33.0)
MCHC: 34.6 g/dL (ref 32.0–36.0)
MCV: 94.3 fL (ref 80.0–100.0)
MPV: 9.8 fL (ref 7.5–12.5)
PLATELETS: 391 10*3/uL (ref 140–400)
RBC: 4.54 10*6/uL (ref 3.80–5.10)
RDW: 12.9 % (ref 11.0–15.0)
WBC: 6.9 10*3/uL (ref 3.8–10.8)

## 2018-04-30 LAB — PHOSPHORUS: Phosphorus: 3.3 mg/dL (ref 2.5–4.5)

## 2018-04-30 LAB — HEMOGLOBIN A1C
EAG (MMOL/L): 7 (calc)
Hgb A1c MFr Bld: 6 % of total Hgb — ABNORMAL HIGH (ref <5.7)
Mean Plasma Glucose: 126 (calc)

## 2018-04-30 LAB — FERRITIN: FERRITIN: 215 ng/mL (ref 16–288)

## 2018-04-30 LAB — VITAMIN D 25 HYDROXY (VIT D DEFICIENCY, FRACTURES): Vit D, 25-Hydroxy: 8 ng/mL — ABNORMAL LOW (ref 30–100)

## 2018-04-30 LAB — PARATHYROID HORMONE, INTACT (NO CA): PTH: 69 pg/mL — ABNORMAL HIGH (ref 14–64)

## 2018-04-30 LAB — HEMOCHROMATOSIS DNA-PCR(C282Y,H63D)

## 2018-05-01 DIAGNOSIS — R7989 Other specified abnormal findings of blood chemistry: Secondary | ICD-10-CM | POA: Diagnosis not present

## 2018-05-01 DIAGNOSIS — R7301 Impaired fasting glucose: Secondary | ICD-10-CM | POA: Diagnosis not present

## 2018-05-01 DIAGNOSIS — R945 Abnormal results of liver function studies: Secondary | ICD-10-CM | POA: Diagnosis not present

## 2018-05-01 DIAGNOSIS — Z5181 Encounter for therapeutic drug level monitoring: Secondary | ICD-10-CM | POA: Diagnosis not present

## 2018-05-01 DIAGNOSIS — E782 Mixed hyperlipidemia: Secondary | ICD-10-CM | POA: Diagnosis not present

## 2018-05-01 LAB — ALDOSTERONE + RENIN ACTIVITY W/ RATIO
ALDO / PRA Ratio: <3.7 (ref 0.0–30.0)
Aldosterone: <1 ng/dL (ref 0.0–30.0)
PRA LC/MS/MS: 0.267 ng/mL/hr (ref 0.167–5.380)

## 2018-05-02 ENCOUNTER — Other Ambulatory Visit: Payer: Self-pay | Admitting: Family Medicine

## 2018-05-02 MED ORDER — ATORVASTATIN CALCIUM 10 MG PO TABS: 10.0000 mg | ORAL_TABLET | Freq: Every day | ORAL | 1 refills | Status: DC

## 2018-05-02 MED ORDER — ATORVASTATIN CALCIUM 20 MG PO TABS: 20.0000 mg | ORAL_TABLET | Freq: Every day | ORAL | 2 refills | Status: DC

## 2018-05-02 NOTE — Progress Notes (Signed)
Andrea Bell, please let the patient know that we really appreciate her getting labs done. We know how much she doesn't like it. Her cholesterol is better, but not quite where we want it. Goal LDL is less than 70 and hers is 105. Let's increase her atorvastatin from 10 mg to 20 mg and encourage a diet low in saturated fats. Her potassium is normal now that she's off the diuretic. She should not need potassium in pills; just try to get enough in her diet. If she get cramps in her legs or palpitations, do let us know so we can check a K+ again. Her liver enzymes are better. Her 3 month blood sugar average is still "prediabetic". Weight loss and health eating will help. Her vit D is much better, up from only 8 to 28. Normal is 30-100. Have her take 2,000 iu of OTC vit D3 daily for 2-3 months, then just 1000 iu daily.

## 2018-05-04 NOTE — Assessment & Plan Note (Signed)
Chronic need for supplemental oxygen, seeing pulmonologist

## 2018-05-04 NOTE — Assessment & Plan Note (Signed)
Thiazide diuretic has been stopped; controlled now with current meds; she is trying to adhere to DASH guidelines

## 2018-05-07 ENCOUNTER — Other Ambulatory Visit: Payer: Self-pay

## 2018-05-07 ENCOUNTER — Ambulatory Visit
Admission: RE | Admit: 2018-05-07 | Discharge: 2018-05-07 | Disposition: A | Discharge status: Home / Self Care | Payer: Medicare HMO | Source: Ambulatory Visit | Attending: Family Medicine | Admitting: Family Medicine

## 2018-05-07 DIAGNOSIS — E2839 Other primary ovarian failure: Secondary | ICD-10-CM | POA: Diagnosis not present

## 2018-05-07 LAB — CBC
HCT: 41.5 % (ref 35.0–45.0)
Hemoglobin: 14.5 g/dL (ref 11.7–15.5)
MCH: 33.2 pg — ABNORMAL HIGH (ref 27.0–33.0)
MCHC: 34.9 g/dL (ref 32.0–36.0)
MCV: 95 fL (ref 80.0–100.0)
MPV: 10 fL (ref 7.5–12.5)
PLATELETS: 348 10*3/uL (ref 140–400)
RBC: 4.37 10*6/uL (ref 3.80–5.10)
RDW: 12.9 % (ref 11.0–15.0)
WBC: 7.5 10*3/uL (ref 3.8–10.8)

## 2018-05-07 LAB — COMPLETE METABOLIC PANEL WITH GFR
AG Ratio: 1.6 (calc) (ref 1.0–2.5)
ALT: 44 U/L — AB (ref 6–29)
AST: 35 U/L (ref 10–35)
Albumin: 4.2 g/dL (ref 3.6–5.1)
Alkaline phosphatase (APISO): 72 U/L (ref 37–153)
BUN: 17 mg/dL (ref 7–25)
CO2: 28 mmol/L (ref 20–32)
Calcium: 10.2 mg/dL (ref 8.6–10.4)
Chloride: 105 mmol/L (ref 98–110)
Creat: 0.73 mg/dL (ref 0.50–0.99)
GFR, Est African American: 102 mL/min/{1.73_m2} (ref >=60)
GFR, Est Non African American: 88 mL/min/{1.73_m2} (ref >=60)
Globulin: 2.6 g/dL (calc) (ref 1.9–3.7)
Glucose, Bld: 101 mg/dL — ABNORMAL HIGH (ref 65–99)
POTASSIUM: 3.9 mmol/L (ref 3.5–5.3)
Sodium: 144 mmol/L (ref 135–146)
Total Bilirubin: 0.4 mg/dL (ref 0.2–1.2)
Total Protein: 6.8 g/dL (ref 6.1–8.1)

## 2018-05-07 LAB — LIPID PANEL
Cholesterol: 177 mg/dL (ref <200)
HDL: 47 mg/dL — ABNORMAL LOW (ref >=50)
LDL Cholesterol (Calc): 105 mg/dL (calc) — ABNORMAL HIGH
Non-HDL Cholesterol (Calc): 130 mg/dL (calc) — ABNORMAL HIGH (ref <130)
Total CHOL/HDL Ratio: 3.8 (calc) (ref <5.0)
Triglycerides: 142 mg/dL (ref <150)

## 2018-05-07 LAB — HEMOCHROMATOSIS DNA-PCR(C282Y,H63D)

## 2018-05-07 LAB — HEMOGLOBIN A1C
EAG (MMOL/L): 7 (calc)
Hgb A1c MFr Bld: 6 % of total Hgb — ABNORMAL HIGH (ref <5.7)
Mean Plasma Glucose: 126 (calc)

## 2018-05-07 LAB — VITAMIN D 25 HYDROXY (VIT D DEFICIENCY, FRACTURES): Vit D, 25-Hydroxy: 28 ng/mL — ABNORMAL LOW (ref 30–100)

## 2018-05-07 LAB — PARATHYROID HORMONE, INTACT (NO CA): PTH: 22 pg/mL (ref 14–64)

## 2018-05-07 LAB — FERRITIN: Ferritin: 227 ng/mL (ref 16–288)

## 2018-05-07 LAB — PHOSPHORUS: Phosphorus: 3 mg/dL (ref 2.5–4.5)

## 2018-05-07 NOTE — Progress Notes (Signed)
Andrea Bell, please let the patient know that she does NOT have osteoporosis; try to get 3 servings of calcium daily (best in food or drinks), weight-bearing activity like walking or gardening, and practice good fall precautions; we'll get her next bone density test in TWO years

## 2018-05-09 ENCOUNTER — Telehealth: Payer: Self-pay | Admitting: Family Medicine

## 2018-05-09 ENCOUNTER — Ambulatory Visit: Admission: RE | Admit: 2018-05-09 | Payer: Medicare HMO | Source: Home / Self Care | Admitting: Gastroenterology

## 2018-05-09 ENCOUNTER — Ambulatory Visit: Payer: Medicare HMO

## 2018-05-09 ENCOUNTER — Ambulatory Visit
Admission: RE | Admit: 2018-05-09 | Discharge: 2018-05-09 | Disposition: A | Discharge status: Home / Self Care | Payer: Medicare HMO | Source: Ambulatory Visit | Attending: Family Medicine | Admitting: Family Medicine

## 2018-05-09 ENCOUNTER — Other Ambulatory Visit: Payer: Self-pay

## 2018-05-09 DIAGNOSIS — R918 Other nonspecific abnormal finding of lung field: Secondary | ICD-10-CM | POA: Insufficient documentation

## 2018-05-09 DIAGNOSIS — R911 Solitary pulmonary nodule: Secondary | ICD-10-CM | POA: Diagnosis not present

## 2018-05-09 HISTORY — DX: Adverse effect of unspecified anesthetic, initial encounter: T41.45XA

## 2018-05-09 HISTORY — DX: Presence of dental prosthetic device (complete) (partial): Z97.2

## 2018-05-09 HISTORY — DX: Other complications of anesthesia, initial encounter: T88.59XA

## 2018-05-09 HISTORY — DX: Dizziness and giddiness: R42

## 2018-05-09 SURGERY — COLONOSCOPY WITH PROPOFOL
Anesthesia: General

## 2018-05-09 NOTE — Telephone Encounter (Signed)
I called [facility] Dr. Raul Del is not in the office today I asked if they could ask him to look at patient's chest CT, want him to be aware and I'll refer her to the lung nodule clinic

## 2018-05-10 ENCOUNTER — Other Ambulatory Visit: Payer: Self-pay | Admitting: Family Medicine

## 2018-05-10 DIAGNOSIS — R918 Other nonspecific abnormal finding of lung field: Secondary | ICD-10-CM

## 2018-05-10 DIAGNOSIS — I3139 Other pericardial effusion (noninflammatory): Secondary | ICD-10-CM

## 2018-05-10 DIAGNOSIS — I313 Pericardial effusion (noninflammatory): Secondary | ICD-10-CM

## 2018-05-10 NOTE — Assessment & Plan Note (Signed)
Trace; ordered echo; because of Coronavirus right now, all imaging and testing that is not urgent is being postponed

## 2018-05-16 ENCOUNTER — Ambulatory Visit: Payer: Medicare HMO

## 2018-05-20 ENCOUNTER — Other Ambulatory Visit: Payer: Self-pay | Admitting: Oncology

## 2018-05-20 DIAGNOSIS — R911 Solitary pulmonary nodule: Secondary | ICD-10-CM

## 2018-05-20 NOTE — Progress Notes (Signed)
  Pulmonary Nodule Clinic Telephone Note  Received referral from PCP, Dr. Sanda Klein.   Per most recent guidelines and recommendations from [place] (2017), this patient requires a CT scan without contrast, in 3-6 months months from last scan and follow-up visit in the pulmonary nodule clinic.   I have personally reviewed all patient's previous imaging.  She was recently evaluated by PCP Dr. Sanda Klein on on 04/25/2018 and again on 04/29/2018.  A stat chest x-ray was ordered due to abnormal labs including elevated CO2 levels.  Chest x-ray on 04/28/2018 showed architectural distortion to right lung concerning for pulmonary nodule.  Follow-up CT scan was recommended.  CT chest from 05/09/2018 revealed several right upper lobe solid pulmonary nodules that had increased in size from previous imaging from 2016.  Per Fleischner guidelines (2017), CT scan without contrast is recommended in 3 to 6 months and if stable repeat CT scan at 18 to 24 months (from first scan) to ensure stability in high risk patients.  High risk factors include: History of heavy smoking, exposure to asbestos, radium or uranium, personal family history of lung cancer, older age, sex (females greater than males), race (black and native [place] greater than weight), marginal speculation, upper lobe location, multiplicity (less than 5 nodules increases risk for malignancy) and emphysema and/or pulmonary fibrosis.   This recommendation follows the consensus statement: Guidelines for Management of Incidental Pulmonary Nodules Detected on CT Images: From the Fleischner Society 2017; Radiology 2017; 284:228-243.    I have placed order for CT scan without contrast to be completed approximately 3-6 months from previous CT scan.  I would like her to see me in our Pulmonary Nodule Clinic after her CT scan.   Faythe Casa, NP 04/02/2018 2:22 PM

## 2018-05-21 DIAGNOSIS — J449 Chronic obstructive pulmonary disease, unspecified: Secondary | ICD-10-CM | POA: Diagnosis not present

## 2018-06-10 ENCOUNTER — Telehealth: Payer: Self-pay

## 2018-06-10 MED ORDER — VENLAFAXINE HCL ER 37.5 MG PO CP24: 37.5000 mg | ORAL_CAPSULE | Freq: Every day | ORAL | 0 refills | Status: DC

## 2018-06-10 NOTE — Telephone Encounter (Signed)
I spoke with patient She says she needs something and asked if I could get her some pills Her mother just passed last night and she has to go make arrangements Her mother's passing was sudden, last night Has to make arrangements in the morning I asked to speak to someone because her speech sounds slurred and she repeated herself a few times She's just trying to get some pills she said and if Dr. Sanda Klein can't do that, it's okay I asked if someone has given her any pills or drunk any alcohol; she says no It's okay she says No SI, "I'm not trying to hurt myself" I explained my concern that her speech is difficult to understand, slurred, and she's repeating herself; I asked to speak to someone else there if she would let me; her husband is outside and she says he knows how she is and she wouldn't go get him; she says she is fine I explained my concern with slurred speech, difficulty understanding her, concerned that she might have a stroke or is otherwise impaired She declined to let me speak with her husband and kept saying she is fine She is out of effexor; I will refill that for her I was not comfortable giving her a benzo as she sounded impaired on the phone I told her about Hospice grief counseling and she says she doesn't need that, just wanted some pills; if I can't give her pills, "it's okay" She did not think I was Dr. Sanda Klein a few times during the phone call and I assured her that I'm Dr. Sanda Klein, and she kept thinking I was "her nurse" ------------------------------------------------------------- I called her husband next I spoke with husband about my concerns She actually passed on 05/18/2022 night, last night I expressed concern about stroke or impairment from alcohol or benzo He says she is doing fine She is just having her moments He was helpful and receptive I explained Hospice available, free service, he agrees those people are fantastic and knows about them He thanked me for my call

## 2018-06-10 NOTE — Telephone Encounter (Signed)
Copied from [place] [phone]. Topic: General - Other >> Jun 10, 2018  1:08 PM Mcneil, Ja-Kwan wrote: Reason for CRM: Pt stated her mother passed away and she needs Dr. Sanda Klein to prescribe the medication that she previously prescribed to help calm her down. Pt requests that the Rx be sent to [facility]. Pt also requests a call back.

## 2018-06-20 DIAGNOSIS — J449 Chronic obstructive pulmonary disease, unspecified: Secondary | ICD-10-CM | POA: Diagnosis not present

## 2018-07-15 ENCOUNTER — Other Ambulatory Visit: Payer: Self-pay | Admitting: Nurse Practitioner

## 2018-07-21 DIAGNOSIS — J449 Chronic obstructive pulmonary disease, unspecified: Secondary | ICD-10-CM | POA: Diagnosis not present

## 2018-08-20 DIAGNOSIS — J449 Chronic obstructive pulmonary disease, unspecified: Secondary | ICD-10-CM | POA: Diagnosis not present

## 2018-08-26 ENCOUNTER — Ambulatory Visit: Payer: Medicare HMO

## 2018-08-27 ENCOUNTER — Telehealth: Payer: Self-pay | Admitting: Oncology

## 2018-08-27 NOTE — Telephone Encounter (Signed)
Spoke to the patient at length about scheduling for her repeat CT scan that was previously ordered by Dr. Sanda Klein her PCP for several lung nodules .  She is agreeable for a CT scan to be scheduled at the end of the month (July 2020).  Will reach out to outpatient imaging center for appointment time and date.  Faythe Casa, NP 08/27/2018 1:18 PM

## 2018-08-28 DIAGNOSIS — H04223 Epiphora due to insufficient drainage, bilateral lacrimal glands: Secondary | ICD-10-CM | POA: Diagnosis not present

## 2018-09-03 ENCOUNTER — Telehealth: Payer: Self-pay | Admitting: Family Medicine

## 2018-09-03 NOTE — Telephone Encounter (Signed)
Unfortunately her PCP is no longer here, she will need a visit for controlled substance. Can be virtual video.

## 2018-09-03 NOTE — Telephone Encounter (Signed)
Medication Refill - Medication: potassium chloride (K-DUR) 10 MEQ tablet    Pt states she is also going to have some dental work done soon and would like a refill of her ALPRAZolam (XANAX) 0.5 MG tablet.   Preferred Pharmacy :  [place], [place] - [place] [phone] (Phone) [phone] (Fax)     Pt as advised that RX refills may take up to 3 business days. We ask that you follow-up with your pharmacy.

## 2018-09-04 MED ORDER — POTASSIUM CHLORIDE ER 10 MEQ PO TBCR: 10.0000 meq | EXTENDED_RELEASE_TABLET | Freq: Every day | ORAL | 0 refills | Status: DC

## 2018-09-04 NOTE — Addendum Note (Signed)
Addended by: Fredderick Severance on: 09/04/2018 05:12 PM   Modules accepted: Orders

## 2018-09-04 NOTE — Telephone Encounter (Signed)
Pt scheduled a telephone visit for Monday 7.20.2020

## 2018-09-04 NOTE — Telephone Encounter (Signed)
Pt also requesting a refill on her potassium medication

## 2018-09-08 ENCOUNTER — Ambulatory Visit: Payer: Medicare HMO | Admitting: Nurse Practitioner

## 2018-09-08 ENCOUNTER — Other Ambulatory Visit: Payer: Self-pay

## 2018-09-09 ENCOUNTER — Telehealth: Payer: Self-pay | Admitting: Oncology

## 2018-09-09 NOTE — Telephone Encounter (Signed)
Called patient to confirm CT scan without contrast for next week 09/16/2018.  She is agreeable and directions given to outpatient imaging center.  Confirmed that I will call with results of her scan.  Faythe Casa, NP 09/09/2018 3:41 PM

## 2018-09-11 ENCOUNTER — Encounter: Payer: Self-pay | Admitting: Nurse Practitioner

## 2018-09-11 ENCOUNTER — Ambulatory Visit (INDEPENDENT_AMBULATORY_CARE_PROVIDER_SITE_OTHER): Payer: Medicare HMO | Admitting: Nurse Practitioner

## 2018-09-11 DIAGNOSIS — F40298 Other specified phobia: Secondary | ICD-10-CM

## 2018-09-11 DIAGNOSIS — F411 Generalized anxiety disorder: Secondary | ICD-10-CM | POA: Diagnosis not present

## 2018-09-11 MED ORDER — ALPRAZOLAM 0.5 MG PO TABS: ORAL_TABLET | ORAL | 0 refills | Status: DC

## 2018-09-11 NOTE — Progress Notes (Signed)
Virtual Visit via Telephone Note  I connected with Andrea Bell on 09/11/18 at  9:20 AM EDT by telephone and verified that I am speaking with the correct person using two identifiers.   Staff discussed the limitations, risks, security and privacy concerns of performing an evaluation and management service by telephone and the availability of in person appointments. Staff also discussed with the patient that there may be a patient responsible charge related to this service. The patient expressed understanding and agreed to proceed.  Patients location: home My location: home office  Other people in meeting: none    HPI  States was put on xanax last year due to severe phobia of needles. She takes 1-2 tablets before specific procedure- especially dental work. On 8/12 she has a special teeth cleaning and was told it was very painful and than on 9/4 having a tooth pulled. Her husband drives her too and from appointments. Patient takes  effexor for hot flashes and GAD.  PHQ2/9: Depression screen [facility] 2/9 09/11/2018 04/29/2018 04/17/2018 10/10/2017 05/09/2017  Decreased Interest 0 0 0 0 0  Down, Depressed, Hopeless 0 0 0 0 0  PHQ - 2 Score 0 0 0 0 0  Altered sleeping 0 0 - - -  Tired, decreased energy 0 0 - - -  Change in appetite 0 0 - - -  Feeling bad or failure about yourself  0 0 - - -  Trouble concentrating 0 0 - - -  Moving slowly or fidgety/restless 0 0 - - -  Suicidal thoughts 0 0 - - -  PHQ-9 Score 0 0 - - -  Difficult doing work/chores Not difficult at all Not difficult at all - - -     PHQ reviewed. Negative  Patient Active Problem List   Diagnosis Date Noted  . Pericardial effusion 05/10/2018  . Estrogen deficiency 04/18/2018  . History of tobacco use 04/18/2018  . Hypercalcemia 04/18/2018  . Watery eyes 04/17/2018  . Respiratory failure with hypoxia ([place]) 04/17/2018  . Allergic rhinitis 05/09/2017  . Overweight (BMI 25.0-29.9) 05/09/2017  . 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 Abnormal liver function tests 10/16/2016  . S/P laparoscopic hysterectomy 12/13/2015  . Abnormal EKG 11/24/2015  . Right hand pain 11/24/2015  . Hyperlipidemia 10/11/2015  . Bilateral hand pain 08/29/2015  . Breast cancer screening 08/29/2015  . Dermatitis 05/03/2015  . Steroid-induced hyperglycemia 04/15/2015  . Arthritis 04/15/2015  . Insomnia 04/15/2015  . Impaired fasting glucose 04/13/2015  . Chronic bilateral low back pain 04/06/2015  . Preventative health care 04/06/2015  . Medication monitoring encounter 04/06/2015  . Osteoporosis 04/06/2015  . Essential hypertension, benign 04/06/2015  . Supplemental oxygen dependent 04/06/2015  . COPD (chronic obstructive pulmonary disease) ([place]) 01/16/2015    Past Medical History:  Diagnosis Date  . Allergy   . Anemia   . Chronic back pain   . Complication of anesthesia    slow to wake  . COPD (chronic obstructive pulmonary disease) (HCC)    2l home oxygen  . Dyspnea   . GERD (gastroesophageal reflux disease)   . Headache   . HTN (hypertension)   . Hyperlipidemia   . Insomnia   . Insomnia   . Osteoarthritis   . Osteoporosis   . Oxygen deficiency   . Tachycardia   . Vertigo    none recently  . Wears dentures    partial lower    Past Surgical History:  Procedure Laterality Date  . ABDOMINAL HYSTERECTOMY    .  CYST REMOVAL TRUNK    . CYSTECTOMY     back and side of head  . CYSTOSCOPY N/A 12/13/2015   Procedure: CYSTOSCOPY;  Surgeon: Malachy Mood, MD;  Location: [facility] ORS;  Service: Gynecology;  Laterality: N/A;  . LAPAROSCOPIC BILATERAL SALPINGO OOPHERECTOMY Bilateral 12/13/2015   Procedure: LAPAROSCOPIC BILATERAL SALPINGO OOPHORECTOMY;  Surgeon: Malachy Mood, MD;  Location: [facility] ORS;  Service: Gynecology;  Laterality: Bilateral;  . LAPAROSCOPIC HYSTERECTOMY N/A 12/13/2015   Procedure: HYSTERECTOMY TOTAL LAPAROSCOPIC;  Surgeon: Malachy Mood, MD;  Location: [facility] ORS;  Service: Gynecology;  Laterality:  N/A;  . tear duct surgery     bilateral  . TUBAL LIGATION      Social History   Tobacco Use  . Smoking status: Former Smoker    Packs/day: 0.50    Years: 15.00    Pack years: 7.50    Types: Cigarettes    Quit date: 04/25/2011    Years since quitting: 7.3  . Smokeless tobacco: Never Used  Substance Use Topics  . Alcohol use: Yes    Alcohol/week: 7.0 standard drinks    Types: 7 Shots of liquor per week    Comment: occasional drinks     Current Outpatient Medications:  .  albuterol (PROVENTIL HFA;VENTOLIN HFA) 108 (90 Base) MCG/ACT inhaler, Inhale 2 puffs into the lungs every 6 (six) hours as needed for wheezing or shortness of breath., Disp: 1 Inhaler, Rfl: 3 .  albuterol (PROVENTIL) (2.5 MG/3ML) 0.083% nebulizer solution, Take 3 mLs (2.5 mg total) by nebulization every 4 (four) hours as needed for wheezing or shortness of breath., Disp: 150 mL, Rfl: 3 .  ALPRAZolam (XANAX) 0.5 MG tablet, One or two tablets by mouth one hour prior to procedure, dentist visit, lab draw; no alcohol; do not drive for six hours, Disp: 6 tablet, Rfl: 2 .  amLODipine (NORVASC) 5 MG tablet, Take 1 tablet (5 mg total) by mouth 2 (two) times daily. This is for blood pressure, do not take lisinopril-hct, Disp: 60 tablet, Rfl: 5 .  aspirin EC 81 MG tablet, Take 81 mg by mouth daily., Disp: , Rfl:  .  atorvastatin (LIPITOR) 10 MG tablet, Take 1 tablet (10 mg total) by mouth at bedtime., Disp: 30 tablet, Rfl: 1 .  meclizine (ANTIVERT) 25 MG tablet, Take 1 tablet (25 mg total) by mouth 3 (three) times daily as needed for dizziness., Disp: 30 tablet, Rfl: 2 .  metoprolol succinate (TOPROL-XL) 25 MG 24 hr tablet, TAKE 1/2 TABLET EVERY DAY, Disp: 45 tablet, Rfl: 3 .  OXYGEN, Inhale 2 L into the lungs as needed. , Disp: , Rfl:  .  potassium chloride (K-DUR) 10 MEQ tablet, Take 1 tablet (10 mEq total) by mouth daily., Disp: 90 tablet, Rfl: 0 .  senna (SENOKOT) 8.6 MG TABS tablet, Take 2 tablets by mouth every other  day., Disp: , Rfl:  .  SYMBICORT 160-4.5 MCG/ACT inhaler, Inhale 2 puffs into the lungs 2 (two) times daily., Disp: 3 Inhaler, Rfl: 3 .  traZODone (DESYREL) 100 MG tablet, TAKE 1 TABLET AT BEDTIME AS NEEDED FOR SLEEP., Disp: 90 tablet, Rfl: 1 .  venlafaxine XR (EFFEXOR XR) 37.5 MG 24 hr capsule, Take 1 capsule (37.5 mg total) by mouth daily with breakfast., Disp: 30 capsule, Rfl: 0 .  Vitamin D, Ergocalciferol, (DRISDOL) 1.25 MG (50000 UT) CAPS capsule, Take 1 capsule (50,000 Units total) by mouth every 7 (seven) days., Disp: 12 capsule, Rfl: 0 .  Blood Pressure KIT, Check blood pressure once or  twice a day, dx I10, Disp: 1 each, Rfl: 0  Allergies  Allergen Reactions  . Codeine Nausea And Vomiting    Was used while in hospital 02/24/14 and no reaction noted--01/06/15 [facility]    ROS   No other specific complaints in a complete review of systems (except as listed in HPI above).  Objective  There were no vitals filed for this visit.   There is no height or weight on file to calculate BMI.  Patient is alert, able to speak in full sentences without difficulty.   Assessment & Plan  1. Needle phobia Verbally reviewed and patient agreed to pain contract. No suspicious behavior [place] - ALPRAZolam (XANAX) 0.5 MG tablet; One or two tablets by mouth one hour prior to procedure, dentist visit, lab draw; no alcohol; do not drive for six hours  Dispense: 8 tablet; Refill: 0  2. GAD (generalized anxiety disorder) Continue effexor. Routine follow-up in september  I discussed the assessment and treatment plan with the patient. The patient was provided an opportunity to ask questions and all were answered. The patient agreed with the plan and demonstrated an understanding of the instructions.   The patient was advised to call back or seek an in-person evaluation if the symptoms worsen or if the condition fails to improve as anticipated.  I provided 13 minutes of non-face-to-face time during  this encounter.   Fredderick Severance, NP

## 2018-09-16 ENCOUNTER — Other Ambulatory Visit: Payer: Self-pay

## 2018-09-16 ENCOUNTER — Inpatient Hospital Stay: Payer: Medicare HMO | Attending: Oncology | Admitting: Oncology

## 2018-09-16 ENCOUNTER — Ambulatory Visit
Admission: RE | Admit: 2018-09-16 | Discharge: 2018-09-16 | Disposition: A | Discharge status: Home / Self Care | Payer: Medicare HMO | Source: Ambulatory Visit | Attending: Oncology | Admitting: Oncology

## 2018-09-16 DIAGNOSIS — R911 Solitary pulmonary nodule: Secondary | ICD-10-CM | POA: Diagnosis not present

## 2018-09-16 DIAGNOSIS — Z87891 Personal history of nicotine dependence: Secondary | ICD-10-CM | POA: Diagnosis not present

## 2018-09-16 DIAGNOSIS — J439 Emphysema, unspecified: Secondary | ICD-10-CM | POA: Diagnosis not present

## 2018-09-16 NOTE — Progress Notes (Signed)
Pulmonary Nodule Clinic Consult note [facility]  [phone] [phone]  Patient Care Team: Arnetha Courser, MD as PCP - General (Family Medicine) Erby Pian, MD as Referring Physician (Pulmonary Disease)   Name of the patient: Andrea Bell  [id number]  1956/02/06   Date of visit: 09/16/2018   Diagnosis-lung nodule  Chief complaint/ Reason for visit- Pulmonary Nodule Clinic Initial Visit  Virtual Visit via Telephone Note  I connected with Andrea Bell on 09/17/18 at  3:00 PM EDT by telephone and verified that I am speaking with the correct person using two identifiers.  Location: Patient: Home Provider: Office   I discussed the limitations, risks, security and privacy concerns of performing an evaluation and management service by telephone and the availability of in person appointments. I also discussed with the patient that there may be a patient responsible charge related to this service. The patient expressed understanding and agreed to proceed.  Past Medical History:  Patient is managed/referred by Dr. Sanda Klein.  She was evaluated by Dr. Sanda Klein in early March 2020 due to abnormal labs; elevated CO2 levels.  Checks x-ray revealed architectural distortion to right lung concerning for pulmonary nodule.  A CT chest was recommended for further clarification.  CT chest revealed right upper lobe solid pulmonary nodules that had increased in size from previous imaging from 2016.  It was recommended she have a repeat CT scan in approximately 3 to 6 months.  Interval history-  Andrea Bell has a past medical history of hypertension, COPD, allergic rhinitis, GERD, osteoporosis, arthritis, insomnia, anemia, dyspnea, hyperlipidemia, chronic back pain, severe needle phobia and obesity.  Her past surgical history consist of hysterectomy, cystectomy, laparoscopic bilateral salpingo-oophorectomy, tubal ligation and tear duct surgery.  Has family history  of hypertension, type 2 diabetes, dementia, COPD and heart disease.  Has family history of brain tumor, breast cancer, ovarian cancer and stomach cancer.  Andrea Bell is a former smoker and quit approximately 8 years ago.  She smoked 1 pack/day for about 45 years.  Both of her parents smoked.  She does not drink alcohol or use illicit drugs.  She denies any known occupational exposure.  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  She thinks she was exposed to cotton dust. Various textile jobs have been investigated in relation to increased risk of lung cancer and the most consistent epidemiological finding were between lung cancer and cotton dust. The textile industry workers are in continuous exposure to dyes, solvents, fibre dusts and various other toxic chemicals. Given she was not in direct contact with manufacturing, places her at a lower risk.  I explained to Andrea Bell that lung cancer is multifactorial and cotton dust alone is less likelyto cause lungcancer. We discussed other occupational substances such as diesel fumes, natural fibers such as asbestos, silica and wood dust, metals including aluminum, arsenic and beryllium, radon, reactive chemicals, secondhand smoke and solvents like benzene and Toluene.Most common occupations associated with the development of lung cancer include painters, Dispensing optician work, metal work, Tourist information centre manager, Conservation officer, historic buildings, truck driving, Chemical engineer, Press photographer, aluminum Engineer, civil (consulting).  Today, patient reports feeling great.  She is unemployed.  She lives at home with her husband.  Her husband continues to smoke but smokes outdoors.  Andrea Bell requires oxygen intermittently throughout the day.  She is active at home and is able to get around on her own.  She denies any activities of daily living concerns.  States  she often is cleaning her house or organizing.  She denies exercise outdoors due to heat and oxygen  requirements..  She denies any cough. Has exertional shortness of breath.  She denies any chest pain.  She denies any recent fevers or illnesses.   ECOG FS:1 - Symptomatic but completely ambulatory  Review of systems- Review of Systems  Constitutional: Positive for malaise/fatigue. Negative for chills, fever and weight loss.  HENT: Negative for congestion, ear pain and tinnitus.   Eyes: Negative.  Negative for blurred vision and double vision.  Respiratory: Positive for shortness of breath. Negative for cough and sputum production.        With exertion-uses oxygen mainly at bedtime  Cardiovascular: Negative.  Negative for chest pain, palpitations and leg swelling.  Gastrointestinal: Positive for heartburn. Negative for abdominal pain, constipation, diarrhea, nausea and vomiting.  Genitourinary: Negative for dysuria, frequency and urgency.  Musculoskeletal: Positive for back pain. Negative for falls.  Skin: Negative.  Negative for rash.  Neurological: Positive for weakness. Negative for headaches.  Endo/Heme/Allergies: Negative.  Does not bruise/bleed easily.  Psychiatric/Behavioral: Negative for depression. The patient has insomnia. The patient is not nervous/anxious.      Allergies  Allergen Reactions   Codeine Nausea And Vomiting    Was used while in hospital 02/24/14 and no reaction noted--01/06/15 [facility]     Past Medical History:  Diagnosis Date   Allergy    Anemia    Chronic back pain    Complication of anesthesia    slow to wake   COPD (chronic obstructive pulmonary disease) (HCC)    2l home oxygen   Dyspnea    GERD (gastroesophageal reflux disease)    Headache    HTN (hypertension)    Hyperlipidemia    Insomnia    Insomnia    Osteoarthritis    Osteoporosis    Oxygen deficiency    Tachycardia    Vertigo    none recently   Wears dentures    partial lower     Past Surgical History:  Procedure Laterality Date   ABDOMINAL HYSTERECTOMY      CYST REMOVAL TRUNK     CYSTECTOMY     back and side of head   CYSTOSCOPY N/A 12/13/2015   Procedure: CYSTOSCOPY;  Surgeon: Malachy Mood, MD;  Location: [facility] ORS;  Service: Gynecology;  Laterality: N/A;   LAPAROSCOPIC BILATERAL SALPINGO OOPHERECTOMY Bilateral 12/13/2015   Procedure: LAPAROSCOPIC BILATERAL SALPINGO OOPHORECTOMY;  Surgeon: Malachy Mood, MD;  Location: [facility] OR[facility];  Service: Gynecology;  Laterality: Bilateral;   LAPAROSCOPIC HYSTERECTOMY N/A 12/13/2015   Procedure: HYSTERECTOMY TOTAL LAPAROSCOPIC;  Surgeon: Malachy Mood, MD;  Location: [facility] OR[facility];  Service: Gynecology;  Laterality: N/A;   tear duct surgery     bilateral   TUBAL LIGATION      Social History   Socioeconomic History   Marital status: Married    Spouse name: Not on file   Number of children: 2   Years of education: Not on file   Highest education level: 12th grade  Occupational History   Not on file  Social Needs   Financial resource strain: Not hard at all   Food insecurity    Worry: Never true    Inability: Never true   Transportation needs    Medical: No    Non-medical: No  Tobacco Use   Smoking status: Former Smoker    Packs/day: 0.50    Years: 15.00    Pack years: 7.50    Types: Cigarettes  Quit date: 04/25/2011    Years since quitting: 7.4   Smokeless tobacco: Never Used  Substance and Sexual Activity   Alcohol use: Yes    Alcohol/week: 7.0 standard drinks    Types: 7 Shots of liquor per week    Comment: occasional drinks   Drug use: No   Sexual activity: Yes  Lifestyle   Physical activity    Days per week: 0 days    Minutes per session: 0 min   Stress: To some extent  Relationships   Social connections    Talks on phone: More than three times a week    Gets together: More than three times a week    Attends religious service: More than 4 times per year    Active member of club or organization: No    Attends meetings of clubs or organizations: Never     Relationship status: Married   Intimate partner violence    Fear of current or ex partner: No    Emotionally abused: No    Physically abused: No    Forced sexual activity: No  Other Topics Concern   Not on file  Social History Narrative   Lives at home with husband.    Family History  Problem Relation Age of Onset   Hypertension Mother    Diabetes Mellitus II Mother    Dementia Mother    Diabetes Mother    Cancer - Other Father        brain tumor   Cancer Father        brain tumor   Breast cancer Maternal Aunt 36   Cancer Maternal Aunt        ovarian   Breast cancer Maternal Grandmother 94   Cancer Maternal Grandmother        breast   Diabetes Brother    Hypertension Brother    COPD Brother    Cancer Maternal Grandfather        stomach   Heart disease Paternal Grandmother    Stroke Paternal Grandfather      Current Outpatient Medications:    albuterol (PROVENTIL HFA;VENTOLIN HFA) 108 (90 Base) MCG/ACT inhaler, Inhale 2 puffs into the lungs every 6 (six) hours as needed for wheezing or shortness of breath., Disp: 1 Inhaler, Rfl: 3   albuterol (PROVENTIL) (2.5 MG/3ML) 0.083% nebulizer solution, Take 3 mLs (2.5 mg total) by nebulization every 4 (four) hours as needed for wheezing or shortness of breath., Disp: 150 mL, Rfl: 3   ALPRAZolam (XANAX) 0.5 MG tablet, One or two tablets by mouth one hour prior to procedure, dentist visit, lab draw; no alcohol; do not drive for six hours, Disp: 8 tablet, Rfl: 0   amLODipine (NORVASC) 5 MG tablet, Take 1 tablet (5 mg total) by mouth 2 (two) times daily. This is for blood pressure, do not take lisinopril-hct, Disp: 60 tablet, Rfl: 5   aspirin EC 81 MG tablet, Take 81 mg by mouth daily., Disp: , Rfl:    atorvastatin (LIPITOR) 10 MG tablet, Take 1 tablet (10 mg total) by mouth at bedtime., Disp: 30 tablet, Rfl: 1   Blood Pressure KIT, Check blood pressure once or twice a day, dx I10, Disp: 1 each, Rfl: 0    meclizine (ANTIVERT) 25 MG tablet, Take 1 tablet (25 mg total) by mouth 3 (three) times daily as needed for dizziness., Disp: 30 tablet, Rfl: 2   metoprolol succinate (TOPROL-XL) 25 MG 24 hr tablet, TAKE 1/2 TABLET EVERY DAY, Disp: 45  tablet, Rfl: 3   OXYGEN, Inhale 2 L into the lungs as needed. , Disp: , Rfl:    potassium chloride (K-DUR) 10 MEQ tablet, Take 1 tablet (10 mEq total) by mouth daily., Disp: 90 tablet, Rfl: 0   senna (SENOKOT) 8.6 MG TABS tablet, Take 2 tablets by mouth every other day., Disp: , Rfl:    SYMBICORT 160-4.5 MCG/ACT inhaler, Inhale 2 puffs into the lungs 2 (two) times daily., Disp: 3 Inhaler, Rfl: 3   traZODone (DESYREL) 100 MG tablet, TAKE 1 TABLET AT BEDTIME AS NEEDED FOR SLEEP., Disp: 90 tablet, Rfl: 1   venlafaxine XR (EFFEXOR XR) 37.5 MG 24 hr capsule, Take 1 capsule (37.5 mg total) by mouth daily with breakfast., Disp: 30 capsule, Rfl: 0   Vitamin D, Ergocalciferol, (DRISDOL) 1.25 MG (50000 UT) CAPS capsule, Take 1 capsule (50,000 Units total) by mouth every 7 (seven) days., Disp: 12 capsule, Rfl: 0  Physical exam: There were no vitals filed for this visit. LImited d/t telephone visit   CMP Latest Ref Rng & Units 05/01/2018  Glucose 65 - 99 mg/dL 101(H)  BUN 7 - 25 mg/dL 17  Creatinine 0.50 - 0.99 mg/dL 0.73  Sodium 135 - 146 mmol/L 144  Potassium 3.5 - 5.3 mmol/L 3.9  Chloride 98 - 110 mmol/L 105  CO2 20 - 32 mmol/L 28  Calcium 8.6 - 10.4 mg/dL 10.2  Total Protein 6.1 - 8.1 g/dL 6.8  Total Bilirubin 0.2 - 1.2 mg/dL 0.4  Alkaline Phos 33 - 130 U/L -  AST 10 - 35 U/L 35  ALT 6 - 29 U/L 44(H)   CBC Latest Ref Rng & Units 05/01/2018  WBC 3.8 - 10.8 Thousand/uL 7.5  Hemoglobin 11.7 - 15.5 g/dL 14.5  Hematocrit 35.0 - 45.0 % 41.5  Platelets 140 - 400 Thousand/uL 348    No images are attached to the encounter.  Ct Chest Wo Contrast  Result Date: 09/16/2018 CLINICAL DATA:  Follow-up lung nodule EXAM: CT CHEST WITHOUT CONTRAST TECHNIQUE:  Multidetector CT imaging of the chest was performed following the standard protocol without IV contrast. COMPARISON:  05/09/2018 FINDINGS: Cardiovascular: Scattered aortic atherosclerosis. Normal heart size. No pericardial effusion. Mediastinum/Nodes: No enlarged mediastinal, hilar, or axillary lymph nodes. Thyroid gland, trachea, and esophagus demonstrate no significant findings. Lungs/Pleura: Moderate centrilobular emphysema. Bandlike scarring of the medial right middle lobe and lingula. No change in a 7 mm pulmonary nodule of the posterior right upper lobe (series 3, image 63). Slight interval decrease in size of a 4 mm pulmonary nodule of the anterior right lower lobe (series 3, image 105). No pleural effusion or pneumothorax. Upper Abdomen: No acute abnormality. Musculoskeletal: No chest wall mass or suspicious bone lesions identified. IMPRESSION: 1. No change in a 7 mm pulmonary nodule of the posterior right upper lobe (series 3, image 63). Recommend additional CT follow-up in 6-12 months to ensure ongoing stability. Slow growing malignancy is not excluded. Slight interval decrease in size of a 4 mm pulmonary nodule of the anterior right lower lobe (series 3, image 105). 2.  Emphysema. 3.  Aortic atherosclerosis. Electronically Signed   By: Eddie Candle M.D.   On: 09/16/2018 15:46    Assessment and plan- Patient is a 63 y.o. female who presents to our lung nodule clinic to review the results of her most recent CT scan.  A telephone visit was conducted to review most recent CT scan results.  CT scan without contrast from today 09/16/2018 revealed no change to her  7 mm pulmonary nodule of the posterior right upper lobe.  Slow-growing malignancy is not excluded.  Slight interval decrease in size of a 4 mm pulmonary nodule of the anterior right lower lobe.  Repeat CT scan in 6 to 12 months is recommended to confirm stability.  Calculating malignancy probability of a pulmonary nodule: Risk factors  include: 1. Age. 2. Cancer history. 3. Diameter of pulmonary nodule and mm 4. Location 5. Smoking history 6. Spiculation present  Based on risk factors, this patient ishighrisk for the development of lung cancer even. I would recommend a59-monthfollow-up with imaging to ensure stability given her smoking history.   Ms. LGordanastopped smoking approximately 8 years ago. She would meet criteria for our low-dose CT screening should nodule remained stable at next visit/imaging.   During our visit, we discussed pulmonary nodules are a common incidental finding and are often how lung cancer is discovered. Lung cancer survival is directly related to the stage at diagnosis. We discussed that nodules can vary in presentation from solitary pulmonary nodules to masses, 2 groundglass opacities and multiple nodules. Pulmonary nodules in the majority of cases are benign but the probability of these becoming malignant cannot be undermined. Early identification of malignant nodules could lead to early diagnosis and increased survival.  We discussed the probability of pulmonary nodules becoming malignant increase with age, pack years of tobacco use, size/characteristics of the nodule and location; with upper lobe involvement being most worrisome.   Given the size of the lung nodule and the acute onset, it was recommended she continue screening and follow-up with the lung nodule clinic.  I would recommend approximately 18 months worth of stability.  At that point, she could then be followed by the lung screening program.  She was in agreement with plan.  We discussed the goal of our clinic is to thoroughly evaluate each nodule, developed a comprehensive, individualized plan of care utilizing the most advanced technology and significantly reduce the time from detection to treatment.A dedicated pulmonary nodule clinic has proven to indeed expedite the detection and treatment of lung  cancer.  Patient education in fact sheet provided along with most recent CT scans.  Plan: Mrs. SSanteeis in agreement to continue being followed by her pulmonary nodule clinic.  I will get her scheduled for repeat CT scan without contrast in approximately 9 months. Patient does express concern about payment for her CT scan.  Her CT scan from today will cost her $120.  I will reach out to Dr. FGrayland Ormondand SAnder Sladeto see if there are funds available to help patients with imaging.  Disposition: RTC in approximately 9 months for repeat CT scan without contrast.   Visit Diagnosis 1. Lung nodule    We discussed the assessment and treatment plan with the patient. The patient was provided an opportunity to ask questions and all were answered. The patient agreed with the plan and demonstrated an understanding of the instructions.   The patient was advised to call back or seek an in-person evaluation if the symptoms worsen or if the condition fails to improve as anticipated.  I provided 25 minutes of non-face-to-face time during this encounter.  Greater than 50% was spent in counseling and coordination of care with this patient including but not limited to discussion of the relevant topics above (See A&P) including, but not limited to diagnosis and management of acute and chronic medical conditions.   Thank you for allowing me to participate in the care of  this very pleasant patient.   Jacquelin Hawking, NP [facility] Cell - [phone] Pager- [phone] 09/17/2018 1:23 PM   CC: Lonia Blood, NP

## 2018-09-17 ENCOUNTER — Other Ambulatory Visit: Payer: Self-pay | Admitting: Oncology

## 2018-09-17 DIAGNOSIS — R911 Solitary pulmonary nodule: Secondary | ICD-10-CM

## 2018-09-20 DIAGNOSIS — J449 Chronic obstructive pulmonary disease, unspecified: Secondary | ICD-10-CM | POA: Diagnosis not present

## 2018-10-16 ENCOUNTER — Ambulatory Visit: Payer: Medicare HMO | Admitting: Family Medicine

## 2018-10-21 ENCOUNTER — Ambulatory Visit (INDEPENDENT_AMBULATORY_CARE_PROVIDER_SITE_OTHER): Payer: Medicare HMO | Admitting: Nurse Practitioner

## 2018-10-21 ENCOUNTER — Encounter: Payer: Self-pay | Admitting: Nurse Practitioner

## 2018-10-21 DIAGNOSIS — J432 Centrilobular emphysema: Secondary | ICD-10-CM

## 2018-10-21 DIAGNOSIS — F411 Generalized anxiety disorder: Secondary | ICD-10-CM

## 2018-10-21 DIAGNOSIS — I700 Atherosclerosis of aorta: Secondary | ICD-10-CM | POA: Insufficient documentation

## 2018-10-21 DIAGNOSIS — E782 Mixed hyperlipidemia: Secondary | ICD-10-CM

## 2018-10-21 DIAGNOSIS — R911 Solitary pulmonary nodule: Secondary | ICD-10-CM | POA: Diagnosis not present

## 2018-10-21 DIAGNOSIS — F321 Major depressive disorder, single episode, moderate: Secondary | ICD-10-CM

## 2018-10-21 DIAGNOSIS — F40231 Fear of injections and transfusions: Secondary | ICD-10-CM

## 2018-10-21 DIAGNOSIS — I10 Essential (primary) hypertension: Secondary | ICD-10-CM

## 2018-10-21 DIAGNOSIS — J449 Chronic obstructive pulmonary disease, unspecified: Secondary | ICD-10-CM | POA: Diagnosis not present

## 2018-10-21 MED ORDER — ATORVASTATIN CALCIUM 10 MG PO TABS: 10.0000 mg | ORAL_TABLET | Freq: Every day | ORAL | 1 refills | Status: DC

## 2018-10-21 MED ORDER — AMLODIPINE BESYLATE 5 MG PO TABS: 5.0000 mg | ORAL_TABLET | Freq: Two times a day (BID) | ORAL | 0 refills | Status: DC

## 2018-10-21 MED ORDER — ESCITALOPRAM OXALATE 5 MG PO TABS: 5.0000 mg | ORAL_TABLET | Freq: Every day | ORAL | 0 refills | Status: DC

## 2018-10-21 MED ORDER — METOPROLOL SUCCINATE ER 25 MG PO TB24: 12.5000 mg | ORAL_TABLET | Freq: Every day | ORAL | 1 refills | Status: DC

## 2018-10-21 NOTE — Progress Notes (Signed)
Virtual Visit via Telephone Note  I connected with Andrea Bell on 10/21/18 at 10:00 AM EDT by telephone and verified that I am speaking with the correct person using two identifiers.   Staff discussed the limitations, risks, security and privacy concerns of performing an evaluation and management service by telephone and the availability of in person appointments. Staff also discussed with the patient that there may be a patient responsible charge related to this service. The patient expressed understanding and agreed to proceed.  Patients location: home My location: work office  Other people in meeting: none    HPI  Hypertension Patient is on amlodipine 5 mg BID (dosed by previous PCP due to increased BP in the mornings), metoprolol 12.5 mg daily.  Takes medications as prescribed with no missed doses a month.  She compliant with low-salt diet.  Denies chest pain, headaches, blurry vision. BP Readings from Last 3 Encounters:  04/29/18 120/78  04/17/18 (!) 140/96  04/17/18 (!) 142/104    Hyperlipidemia Patient rx atorvastatin 10 mg nightly takes medications as prescribed with no missed doses a month.  Diet: eats vegetables every day, fried foods- rarely, red meats a few times a week  Denies myalgias Lab Results  Component Value Date   CHOL 177 05/01/2018   HDL 47 (L) 05/01/2018   LDLCALC 105 (H) 05/01/2018   TRIG 142 05/01/2018   CHOLHDL 3.8 05/01/2018    Bone density scan Completed march 2020- showed normal bones.  Takes vitamin D and calcium supplementation  COPD Smoking history: She quit smoking in 2013 Inhalers: Symbicort 2 puffs twice daily,  albuterol PRN, she wears oxygen 2L PRN.  Pulmonologist: follows up with Dr. Beverlyn Roux chronic shortness of breath or wheezing.  7 mm pulmonary nodule noted in routine CT screening- followed by oncology last month CT showed stable nodule recommended 6-12 month follow-up   Generalized anxiety disorder She is  prescribed Effexor 37.5 mg daily but states it didn't help her so she stopped.  She has a severe needle phobia and takes 0.5 to 1 mg of Xanax before appointments requiring needles such as lab draws and dental appointments. States she recently lost her mother and is having a hard time coping with this. Has good support system with husband and kids    PHQ2/9: Depression screen [facility] 2/9 10/21/2018 09/11/2018 04/29/2018 04/17/2018 10/10/2017  Decreased Interest 3 0 0 0 0  Down, Depressed, Hopeless 1 0 0 0 0  PHQ - 2 Score 4 0 0 0 0  Altered sleeping 2 0 0 - -  Tired, decreased energy 1 0 0 - -  Change in appetite 1 0 0 - -  Feeling bad or failure about yourself  1 0 0 - -  Trouble concentrating 0 0 0 - -  Moving slowly or fidgety/restless 0 0 0 - -  Suicidal thoughts 0 0 0 - -  PHQ-9 Score 9 0 0 - -  Difficult doing work/chores Somewhat difficult Not difficult at all Not difficult at all - -     PHQ reviewed. Positive- situational grief, but patient also notes overall depression for a while now.   Patient Active Problem List   Diagnosis Date Noted  . Aortic atherosclerosis ([place]) 10/21/2018  . Estrogen deficiency 04/18/2018  . History of tobacco use 04/18/2018  . Hypercalcemia 04/18/2018  . Allergic rhinitis 05/09/2017  . Overweight (BMI 25.0-29.9) 05/09/2017  . 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 Abnormal liver function tests 10/16/2016  . Abnormal EKG  11/24/2015  . Hyperlipidemia 10/11/2015  . Steroid-induced hyperglycemia 04/15/2015  . Arthritis 04/15/2015  . Insomnia 04/15/2015  . Chronic bilateral low back pain 04/06/2015  . Medication monitoring encounter 04/06/2015  . Essential hypertension, benign 04/06/2015  . Supplemental oxygen dependent 04/06/2015  . COPD (chronic obstructive pulmonary disease) ([place]) 01/16/2015    Past Medical History:  Diagnosis Date  . Allergy   . Anemia   . Chronic back pain   . Complication of anesthesia    slow to wake  . COPD (chronic  obstructive pulmonary disease) (HCC)    2l home oxygen  . Dyspnea   . GERD (gastroesophageal reflux disease)   . Headache   . HTN (hypertension)   . Hyperlipidemia   . Insomnia   . Insomnia   . Osteoarthritis   . Osteoporosis   . Oxygen deficiency   . Tachycardia   . Vertigo    none recently  . Wears dentures    partial lower    Past Surgical History:  Procedure Laterality Date  . ABDOMINAL HYSTERECTOMY    . CYST REMOVAL TRUNK    . CYSTECTOMY     back and side of head  . CYSTOSCOPY N/A 12/13/2015   Procedure: CYSTOSCOPY;  Surgeon: Malachy Mood, MD;  Location: [facility] ORS;  Service: Gynecology;  Laterality: N/A;  . LAPAROSCOPIC BILATERAL SALPINGO OOPHERECTOMY Bilateral 12/13/2015   Procedure: LAPAROSCOPIC BILATERAL SALPINGO OOPHORECTOMY;  Surgeon: Malachy Mood, MD;  Location: [facility] ORS;  Service: Gynecology;  Laterality: Bilateral;  . LAPAROSCOPIC HYSTERECTOMY N/A 12/13/2015   Procedure: HYSTERECTOMY TOTAL LAPAROSCOPIC;  Surgeon: Malachy Mood, MD;  Location: [facility] ORS;  Service: Gynecology;  Laterality: N/A;  . tear duct surgery     bilateral  . TUBAL LIGATION      Social History   Tobacco Use  . Smoking status: Former Smoker    Packs/day: 0.50    Years: 15.00    Pack years: 7.50    Types: Cigarettes    Quit date: 04/25/2011    Years since quitting: 7.4  . Smokeless tobacco: Never Used  Substance Use Topics  . Alcohol use: Yes    Alcohol/week: 7.0 standard drinks    Types: 7 Shots of liquor per week    Comment: occasional drinks     Current Outpatient Medications:  .  albuterol (PROVENTIL HFA;VENTOLIN HFA) 108 (90 Base) MCG/ACT inhaler, Inhale 2 puffs into the lungs every 6 (six) hours as needed for wheezing or shortness of breath., Disp: 1 Inhaler, Rfl: 3 .  albuterol (PROVENTIL) (2.5 MG/3ML) 0.083% nebulizer solution, Take 3 mLs (2.5 mg total) by nebulization every 4 (four) hours as needed for wheezing or shortness of breath., Disp: 150 mL, Rfl: 3 .   ALPRAZolam (XANAX) 0.5 MG tablet, One or two tablets by mouth one hour prior to procedure, dentist visit, lab draw; no alcohol; do not drive for six hours, Disp: 8 tablet, Rfl: 0 .  amLODipine (NORVASC) 5 MG tablet, Take 1 tablet (5 mg total) by mouth 2 (two) times daily. This is for blood pressure, do not take lisinopril-hct, Disp: 60 tablet, Rfl: 5 .  aspirin EC 81 MG tablet, Take 81 mg by mouth daily., Disp: , Rfl:  .  atorvastatin (LIPITOR) 10 MG tablet, Take 1 tablet (10 mg total) by mouth at bedtime., Disp: 30 tablet, Rfl: 1 .  cholecalciferol (VITAMIN D3) 25 MCG (1000 UT) tablet, Take 1,000 Units by mouth daily., Disp: , Rfl:  .  meclizine (ANTIVERT) 25 MG tablet, Take 1  tablet (25 mg total) by mouth 3 (three) times daily as needed for dizziness., Disp: 30 tablet, Rfl: 2 .  metoprolol succinate (TOPROL-XL) 25 MG 24 hr tablet, TAKE 1/2 TABLET EVERY DAY, Disp: 45 tablet, Rfl: 3 .  OXYGEN, Inhale 2 L into the lungs as needed. , Disp: , Rfl:  .  potassium chloride (K-DUR) 10 MEQ tablet, Take 1 tablet (10 mEq total) by mouth daily., Disp: 90 tablet, Rfl: 0 .  senna (SENOKOT) 8.6 MG TABS tablet, Take 2 tablets by mouth every other day., Disp: , Rfl:  .  SYMBICORT 160-4.5 MCG/ACT inhaler, Inhale 2 puffs into the lungs 2 (two) times daily., Disp: 3 Inhaler, Rfl: 3 .  traZODone (DESYREL) 100 MG tablet, TAKE 1 TABLET AT BEDTIME AS NEEDED FOR SLEEP., Disp: 90 tablet, Rfl: 1 .  escitalopram (LEXAPRO) 5 MG tablet, Take 1 tablet (5 mg total) by mouth daily., Disp: 90 tablet, Rfl: 0  Allergies  Allergen Reactions  . Codeine Nausea And Vomiting    Was used while in hospital 02/24/14 and no reaction noted--01/06/15 BC    ROS    No other specific complaints in a complete review of systems (except as listed in HPI above).  Objective  There were no vitals filed for this visit.   There is no height or weight on file to calculate BMI.  Patient is alert, able to speak in full sentences without  difficulty.   Assessment & Plan  1. Essential hypertension, benign - metoprolol succinate (TOPROL-XL) 25 MG 24 hr tablet; Take 0.5 tablets (12.5 mg total) by mouth daily.  Dispense: 45 tablet; Refill: 1 - amLODipine (NORVASC) 5 MG tablet; Take 1 tablet (5 mg total) by mouth 2 (two) times daily. This is for blood pressure, do not take lisinopril-hct  Dispense: 180 tablet; Refill: 0  2. Centrilobular emphysema ([place]) Follows up with pulmonology   3. Mixed hyperlipidemia - atorvastatin (LIPITOR) 10 MG tablet; Take 1 tablet (10 mg total) by mouth at bedtime.  Dispense: 90 tablet; Refill: 1  4. Aortic atherosclerosis (HCC) - atorvastatin (LIPITOR) 10 MG tablet; Take 1 tablet (10 mg total) by mouth at bedtime.  Dispense: 90 tablet; Refill: 1  5. Pulmonary nodule stable  6. GAD (generalized anxiety disorder) - escitalopram (LEXAPRO) 5 MG tablet; Take 1 tablet (5 mg total) by mouth daily.  Dispense: 90 tablet; Refill: 0   7. Severe needle phobia Xanax PRN   8. Current moderate episode of major depressive disorder without prior episode ([place]) Follow up in 2 months - escitalopram (LEXAPRO) 5 MG tablet; Take 1 tablet (5 mg total) by mouth daily.  Dispense: 90 tablet; Refill: 0  I discussed the assessment and treatment plan with the patient. The patient was provided an opportunity to ask questions and all were answered. The patient agreed with the plan and demonstrated an understanding of the instructions.   The patient was advised to call back or seek an in-person evaluation if the symptoms worsen or if the condition fails to improve as anticipated.  I provided 22 minutes of non-face-to-face time during this encounter.   Fredderick Severance, NP

## 2018-10-29 ENCOUNTER — Telehealth: Payer: Self-pay | Admitting: Family Medicine

## 2018-10-29 NOTE — Chronic Care Management (AMB) (Signed)
°  Chronic Care Management   Outreach Note  10/29/2018 Name: Andrea Bell MRN: [id number] DOB: 02/04/1956  Referred by: Arnetha Courser, MD Reason for referral : Chronic Care Management (Initial CCM outreach )   An unsuccessful telephone outreach was attempted today. The patient was referred to the case management team by for assistance with chronic care management and care coordination.   Follow Up Plan: A HIPPA compliant phone message was left for the patient providing contact information and requesting a return call.  The care management team will reach out to the patient again over the next 7 days.  If patient returns call to provider office, please advise to call [place] [facility] [place]  [email or web address]   ??[phone]

## 2018-11-03 NOTE — Chronic Care Management (AMB) (Signed)
Chronic Care Management   Note  11/03/2018 Name: Andrea Bell MRN: [id number] DOB: Jun 03, 1955  Andrea Bell is a 63 y.o. year old female who is a primary care patient of Lada, Satira Anis, MD. I reached out to [name] by phone today in response to a referral sent by Andrea Bell's health plan.    Andrea Bell was given information about Chronic Care Management services today including:  1. CCM service includes personalized support from designated clinical staff supervised by her physician, including individualized plan of care and coordination with other care providers 2. 24/7 contact phone numbers for assistance for urgent and routine care needs. 3. Service will only be billed when office clinical staff spend 20 minutes or more in a month to coordinate care. 4. Only one practitioner may furnish and bill the service in a calendar month. 5. The patient may stop CCM services at any time (effective at the end of the month) by phone call to the office staff. 6. The patient will be responsible for cost sharing (co-pay) of up to 20% of the service fee (after annual deductible is met).  Patient agreed to services and verbal consent obtained.   Follow up plan: Telephone appointment with CCM team member scheduled for: 12/22/2018  [place]  [email or web address]   ??[phone]

## 2018-11-20 DIAGNOSIS — J449 Chronic obstructive pulmonary disease, unspecified: Secondary | ICD-10-CM | POA: Diagnosis not present

## 2018-12-09 DIAGNOSIS — J439 Emphysema, unspecified: Secondary | ICD-10-CM | POA: Diagnosis not present

## 2018-12-09 DIAGNOSIS — J849 Interstitial pulmonary disease, unspecified: Secondary | ICD-10-CM | POA: Diagnosis not present

## 2018-12-09 DIAGNOSIS — R0600 Dyspnea, unspecified: Secondary | ICD-10-CM | POA: Diagnosis not present

## 2018-12-21 DIAGNOSIS — J449 Chronic obstructive pulmonary disease, unspecified: Secondary | ICD-10-CM | POA: Diagnosis not present

## 2018-12-22 ENCOUNTER — Telehealth: Payer: Medicare HMO

## 2018-12-22 ENCOUNTER — Ambulatory Visit: Payer: Self-pay

## 2018-12-22 NOTE — Chronic Care Management (AMB) (Signed)
  Chronic Care Management   Outreach Note  12/22/2018 Name: Andrea Bell MRN: [id number] DOB: 1955/05/04   An unsuccessful telephone outreach was attempted today. Mrs. Sammons was referred to the case management team for assistance with care management and care coordination.    Follow Up Plan: -A HIPPA compliant phone message was left for the patient providing contact information and requesting a return call.  -A member of the care management team will attempt outreach within the next two weeks.     [facility] Care Management [phone]

## 2019-01-01 ENCOUNTER — Other Ambulatory Visit: Payer: Self-pay

## 2019-01-01 DIAGNOSIS — Z1239 Encounter for other screening for malignant neoplasm of breast: Secondary | ICD-10-CM

## 2019-01-01 DIAGNOSIS — Z1231 Encounter for screening mammogram for malignant neoplasm of breast: Secondary | ICD-10-CM

## 2019-01-05 ENCOUNTER — Ambulatory Visit: Payer: Self-pay

## 2019-01-05 ENCOUNTER — Telehealth: Payer: Self-pay

## 2019-01-05 NOTE — Chronic Care Management (AMB) (Signed)
  Chronic Care Management   Outreach Note  01/05/2019 Name: Andrea Bell MRN: [id number] DOB: 1956/01/30  Referred by: Health Plan  A second unsuccessful telephone outreach was attempted today. Andrea Bell was referred to the case management team for assistance with care management and care coordination. Left a HIPAA compliant voice message along with contact information requesting a return call.   Follow Up Plan:  The care management team will reach out again within the next two to three weeks.    [facility] Care Management [phone]

## 2019-01-07 ENCOUNTER — Inpatient Hospital Stay: Admission: RE | Admit: 2019-01-07 | Payer: Medicare HMO | Source: Ambulatory Visit

## 2019-01-20 DIAGNOSIS — J449 Chronic obstructive pulmonary disease, unspecified: Secondary | ICD-10-CM | POA: Diagnosis not present

## 2019-01-23 ENCOUNTER — Other Ambulatory Visit: Payer: Self-pay

## 2019-01-23 DIAGNOSIS — I10 Essential (primary) hypertension: Secondary | ICD-10-CM

## 2019-01-23 DIAGNOSIS — F321 Major depressive disorder, single episode, moderate: Secondary | ICD-10-CM

## 2019-01-23 MED ORDER — ESCITALOPRAM OXALATE 5 MG PO TABS: 5.0000 mg | ORAL_TABLET | Freq: Every day | ORAL | 0 refills | Status: DC

## 2019-01-23 MED ORDER — AMLODIPINE BESYLATE 5 MG PO TABS: 5.0000 mg | ORAL_TABLET | Freq: Two times a day (BID) | ORAL | 0 refills | Status: DC

## 2019-01-26 ENCOUNTER — Telehealth: Payer: Self-pay

## 2019-01-26 ENCOUNTER — Ambulatory Visit: Payer: Self-pay

## 2019-01-26 ENCOUNTER — Other Ambulatory Visit: Payer: Self-pay

## 2019-01-26 DIAGNOSIS — F321 Major depressive disorder, single episode, moderate: Secondary | ICD-10-CM

## 2019-01-26 NOTE — Chronic Care Management (AMB) (Signed)
  Chronic Care Management   Outreach Note  01/26/2019 Name: Paysen Hamilton MRN: [id number] DOB: 1955/05/01  Referred by: Health Plan Reason for referral : Chronic Care Management    Third unsuccessful telephone outreach was attempted today. Ms. Depass was referred to the care management team for assistance with chronic care management and care coordination. She was previously assigned to Enid Derry, MD. Per [place] staff, she has not established care with a new primary care provider since Dr. Delight Ovens departure. Marland Kitchen  PLAN The care management team will gladly follow-up with Ms. Dimaio at anytime in the future if she is willing to receive assistance.     [facility] Care Management [phone]

## 2019-01-27 MED ORDER — ESCITALOPRAM OXALATE 5 MG PO TABS: 5.0000 mg | ORAL_TABLET | Freq: Every day | ORAL | 0 refills | Status: DC

## 2019-01-27 NOTE — Telephone Encounter (Signed)
lvm to schedule 30 day follow up

## 2019-01-27 NOTE — Telephone Encounter (Signed)
Please schedule patient for follow up in the next 30 days.  

## 2019-01-29 ENCOUNTER — Ambulatory Visit: Payer: Medicare HMO

## 2019-02-16 ENCOUNTER — Ambulatory Visit: Payer: Medicare HMO

## 2019-02-20 DIAGNOSIS — J449 Chronic obstructive pulmonary disease, unspecified: Secondary | ICD-10-CM | POA: Diagnosis not present

## 2019-03-17 ENCOUNTER — Telehealth: Payer: Self-pay | Admitting: *Deleted

## 2019-03-17 NOTE — Telephone Encounter (Signed)
Left message with patient to call back to review upcoming appts in the lung nodule clinic. Awaiting call back.

## 2019-03-23 DIAGNOSIS — J449 Chronic obstructive pulmonary disease, unspecified: Secondary | ICD-10-CM | POA: Diagnosis not present

## 2019-03-26 DIAGNOSIS — J449 Chronic obstructive pulmonary disease, unspecified: Secondary | ICD-10-CM | POA: Diagnosis not present

## 2019-03-26 DIAGNOSIS — R0600 Dyspnea, unspecified: Secondary | ICD-10-CM | POA: Diagnosis not present

## 2019-03-26 DIAGNOSIS — J849 Interstitial pulmonary disease, unspecified: Secondary | ICD-10-CM | POA: Diagnosis not present

## 2019-03-26 DIAGNOSIS — R918 Other nonspecific abnormal finding of lung field: Secondary | ICD-10-CM | POA: Diagnosis not present

## 2019-03-27 ENCOUNTER — Telehealth: Payer: Self-pay | Admitting: *Deleted

## 2019-03-27 NOTE — Telephone Encounter (Signed)
Patient seen by Dr Raul Del and he ordered a CT chest and wants it now, He did not know at the time that we had ordered a CT in April. They were told that we will have to be the one who cancels the April CT scan as it is the same test they are having done now.

## 2019-03-27 NOTE — Telephone Encounter (Signed)
Sounds good. I will get it canceled.   Andrea Bell

## 2019-04-07 ENCOUNTER — Telehealth: Payer: Self-pay | Admitting: Family Medicine

## 2019-04-07 NOTE — Telephone Encounter (Signed)
Patient states she received a medication in the mail today from [facility] and is unsure if she should take it. Patient states she does not know the name of the medication or the prescribing physician. Patient requesting call back from CMA to discuss further.

## 2019-04-08 ENCOUNTER — Other Ambulatory Visit: Payer: Self-pay

## 2019-04-08 NOTE — Telephone Encounter (Signed)
Followed up with pt, medication she was talking about was for mood.

## 2019-04-09 ENCOUNTER — Other Ambulatory Visit: Payer: Self-pay | Admitting: Family Medicine

## 2019-04-09 DIAGNOSIS — E559 Vitamin D deficiency, unspecified: Secondary | ICD-10-CM

## 2019-04-09 DIAGNOSIS — E876 Hypokalemia: Secondary | ICD-10-CM

## 2019-04-09 DIAGNOSIS — E213 Hyperparathyroidism, unspecified: Secondary | ICD-10-CM

## 2019-04-09 DIAGNOSIS — I700 Atherosclerosis of aorta: Secondary | ICD-10-CM

## 2019-04-09 DIAGNOSIS — Z5181 Encounter for therapeutic drug level monitoring: Secondary | ICD-10-CM

## 2019-04-09 DIAGNOSIS — R945 Abnormal results of liver function studies: Secondary | ICD-10-CM

## 2019-04-09 DIAGNOSIS — I10 Essential (primary) hypertension: Secondary | ICD-10-CM

## 2019-04-09 DIAGNOSIS — E782 Mixed hyperlipidemia: Secondary | ICD-10-CM

## 2019-04-09 DIAGNOSIS — R7989 Other specified abnormal findings of blood chemistry: Secondary | ICD-10-CM

## 2019-04-09 NOTE — Telephone Encounter (Signed)
Andrea Bell-  Pt needs her potassium checked, it has been about a year with no labs and no visits here.   Please put in labs and ask pt to schedule appt.  Her potassium was very abnormal and will need monitoring while we supplement  The sooner she gets labs done that faster I can get her meds to her in appropriate dose (too high or low of potasium can cause severe illness/sx or be fatal)

## 2019-04-09 NOTE — Telephone Encounter (Signed)
I put in all the labs - she has severe needle phobia on chart make sure to tell her how good leslie is - I could send in something for her as needed for severe anxiety (if she has a ride) so she can get blood work done  Thanks   OV can be after labs - but I would be good to see her soon since its been about one yar

## 2019-04-10 NOTE — Telephone Encounter (Signed)
Please call patient in something for anxiety she will get her husband to bring her in for labs

## 2019-04-13 MED ORDER — LORAZEPAM 0.5 MG PO TABS: 0.5000 mg | ORAL_TABLET | Freq: Once | ORAL | 0 refills | Status: DC | PRN

## 2019-04-13 MED ORDER — POTASSIUM CHLORIDE ER 10 MEQ PO TBCR: 10.0000 meq | EXTENDED_RELEASE_TABLET | Freq: Every day | ORAL | 0 refills | Status: DC

## 2019-04-14 NOTE — Telephone Encounter (Signed)
Left detailed

## 2019-04-20 DIAGNOSIS — J449 Chronic obstructive pulmonary disease, unspecified: Secondary | ICD-10-CM | POA: Diagnosis not present

## 2019-04-21 ENCOUNTER — Telehealth: Payer: Self-pay | Admitting: *Deleted

## 2019-04-21 ENCOUNTER — Ambulatory Visit: Payer: Medicare HMO

## 2019-04-21 NOTE — Telephone Encounter (Signed)
Pt has been made aware of upcoming appts for follow up CT scan and follow up appt with Jennifer Burns, NP in the Lung Nodule Clinic. Pt verbalized understanding. Nothing further needed at this time. 

## 2019-04-29 DIAGNOSIS — Z5181 Encounter for therapeutic drug level monitoring: Secondary | ICD-10-CM | POA: Diagnosis not present

## 2019-04-29 DIAGNOSIS — E782 Mixed hyperlipidemia: Secondary | ICD-10-CM | POA: Diagnosis not present

## 2019-04-29 DIAGNOSIS — E559 Vitamin D deficiency, unspecified: Secondary | ICD-10-CM | POA: Diagnosis not present

## 2019-04-29 DIAGNOSIS — R945 Abnormal results of liver function studies: Secondary | ICD-10-CM | POA: Diagnosis not present

## 2019-04-29 DIAGNOSIS — I700 Atherosclerosis of aorta: Secondary | ICD-10-CM | POA: Diagnosis not present

## 2019-04-29 DIAGNOSIS — E213 Hyperparathyroidism, unspecified: Secondary | ICD-10-CM | POA: Diagnosis not present

## 2019-04-29 DIAGNOSIS — E876 Hypokalemia: Secondary | ICD-10-CM | POA: Diagnosis not present

## 2019-04-29 DIAGNOSIS — I10 Essential (primary) hypertension: Secondary | ICD-10-CM | POA: Diagnosis not present

## 2019-04-30 LAB — COMPLETE METABOLIC PANEL WITH GFR
AG Ratio: 1.7 (calc) (ref 1.0–2.5)
ALT: 41 U/L — ABNORMAL HIGH (ref 6–29)
AST: 88 U/L — ABNORMAL HIGH (ref 10–35)
Albumin: 4.7 g/dL (ref 3.6–5.1)
Alkaline phosphatase (APISO): 93 U/L (ref 37–153)
BUN: 14 mg/dL (ref 7–25)
CO2: 35 mmol/L — ABNORMAL HIGH (ref 20–32)
Calcium: 10 mg/dL (ref 8.6–10.4)
Chloride: 95 mmol/L — ABNORMAL LOW (ref 98–110)
Creat: 0.78 mg/dL (ref 0.50–0.99)
GFR, Est African American: 94 mL/min/{1.73_m2} (ref >=60)
GFR, Est Non African American: 81 mL/min/{1.73_m2} (ref >=60)
Globulin: 2.8 g/dL (calc) (ref 1.9–3.7)
Glucose, Bld: 148 mg/dL — ABNORMAL HIGH (ref 65–99)
Potassium: 3.2 mmol/L — ABNORMAL LOW (ref 3.5–5.3)
Sodium: 143 mmol/L (ref 135–146)
Total Bilirubin: 1 mg/dL (ref 0.2–1.2)
Total Protein: 7.5 g/dL (ref 6.1–8.1)

## 2019-04-30 LAB — CBC WITH DIFFERENTIAL/PLATELET
Absolute Monocytes: 854 cells/uL (ref 200–950)
Basophils Absolute: 63 cells/uL (ref 0–200)
Basophils Relative: 0.9 %
Eosinophils Absolute: 42 cells/uL (ref 15–500)
Eosinophils Relative: 0.6 %
HCT: 44.4 % (ref 35.0–45.0)
Hemoglobin: 16 g/dL — ABNORMAL HIGH (ref 11.7–15.5)
Lymphs Abs: 1162 cells/uL (ref 850–3900)
MCH: 35.4 pg — ABNORMAL HIGH (ref 27.0–33.0)
MCHC: 36 g/dL (ref 32.0–36.0)
MCV: 98.2 fL (ref 80.0–100.0)
MPV: 9.6 fL (ref 7.5–12.5)
Monocytes Relative: 12.2 %
Neutro Abs: 4879 cells/uL (ref 1500–7800)
Neutrophils Relative %: 69.7 %
Platelets: 284 10*3/uL (ref 140–400)
RBC: 4.52 10*6/uL (ref 3.80–5.10)
RDW: 14 % (ref 11.0–15.0)
Total Lymphocyte: 16.6 %
WBC: 7 10*3/uL (ref 3.8–10.8)

## 2019-04-30 LAB — PARATHYROID HORMONE, INTACT (NO CA): PTH: 110 pg/mL — ABNORMAL HIGH (ref 14–64)

## 2019-04-30 LAB — VITAMIN D 25 HYDROXY (VIT D DEFICIENCY, FRACTURES): Vit D, 25-Hydroxy: 10 ng/mL — ABNORMAL LOW (ref 30–100)

## 2019-04-30 LAB — HEMOGLOBIN A1C
Hgb A1c MFr Bld: 5.7 % of total Hgb — ABNORMAL HIGH (ref <5.7)
Mean Plasma Glucose: 117 (calc)
eAG (mmol/L): 6.5 (calc)

## 2019-04-30 LAB — LIPID PANEL
Cholesterol: 249 mg/dL — ABNORMAL HIGH (ref <200)
HDL: 76 mg/dL (ref >=50)
LDL Cholesterol (Calc): 148 mg/dL (calc) — ABNORMAL HIGH
Non-HDL Cholesterol (Calc): 173 mg/dL (calc) — ABNORMAL HIGH (ref <130)
Total CHOL/HDL Ratio: 3.3 (calc) (ref <5.0)
Triglycerides: 130 mg/dL (ref <150)

## 2019-04-30 MED ORDER — VITAMIN D (ERGOCALCIFEROL) 1.25 MG (50000 UNIT) PO CAPS: 50000.0000 [IU] | ORAL_CAPSULE | ORAL | 0 refills | Status: DC

## 2019-04-30 MED ORDER — POTASSIUM CHLORIDE CRYS ER 20 MEQ PO TBCR: 20.0000 meq | EXTENDED_RELEASE_TABLET | Freq: Every day | ORAL | 0 refills | Status: DC

## 2019-05-21 DIAGNOSIS — J449 Chronic obstructive pulmonary disease, unspecified: Secondary | ICD-10-CM | POA: Diagnosis not present

## 2019-05-28 ENCOUNTER — Other Ambulatory Visit: Payer: Self-pay | Admitting: Family Medicine

## 2019-05-28 DIAGNOSIS — F321 Major depressive disorder, single episode, moderate: Secondary | ICD-10-CM

## 2019-05-29 ENCOUNTER — Other Ambulatory Visit: Payer: Self-pay | Admitting: Family Medicine

## 2019-05-29 DIAGNOSIS — I10 Essential (primary) hypertension: Secondary | ICD-10-CM

## 2019-05-29 MED ORDER — TRAZODONE HCL 100 MG PO TABS: 100.0000 mg | ORAL_TABLET | Freq: Every evening | ORAL | 0 refills | Status: DC | PRN

## 2019-05-29 MED ORDER — METOPROLOL SUCCINATE ER 25 MG PO TB24: 12.5000 mg | ORAL_TABLET | Freq: Every day | ORAL | 0 refills | Status: DC

## 2019-05-29 NOTE — Telephone Encounter (Signed)
Medication Refill - Medication: Toprol 25 mg, Trazadone 100mg   Has the patient contacted their pharmacy? Yes.   (Agent: If no, request that the patient contact the pharmacy for the refill.) (Agent: If yes, when and what did the pharmacy advise?)  Preferred Pharmacy (with phone number or street name): [facility]  Agent: Please be advised that RX refills may take up to 3 business days. We ask that you follow-up with your pharmacy.

## 2019-05-29 NOTE — Telephone Encounter (Signed)
Requested medication (s) are due for refill today: yes  Requested medication (s) are on the active medication list: yes    Future visit scheduled: no  Notes to clinic: review for refill Last filled by provider that is not a practice anymore   Requested Prescriptions  Pending Prescriptions Disp Refills   traZODone (DESYREL) 100 MG tablet 30 tablet 0    Sig: Take 1 tablet (100 mg total) by mouth at bedtime as needed. for sleep      Psychiatry: Antidepressants - Serotonin Modulator Failed - 05/29/2019  9:51 AM      Failed - Valid encounter within last 6 months    Recent Outpatient Visits           7 months ago Essential hypertension, benign   [facility] [place], Bethel Born, NP   8 months ago Needle phobia   [place], NP   1 year ago Hypokalemia   [facility], Satira Anis, MD   1 year ago Essential hypertension, benign   [facility], Satira Anis, MD   1 year ago Essential hypertension, benign   [facility] Lada, Satira Anis, MD                metoprolol succinate (TOPROL-XL) 25 MG 24 hr tablet 45 tablet 0    Sig: Take 0.5 tablets (12.5 mg total) by mouth daily.      Cardiovascular:  Beta Blockers Failed - 05/29/2019  9:51 AM      Failed - Valid encounter within last 6 months    Recent Outpatient Visits           7 months ago Essential hypertension, benign   [facility] [place], Bethel Born, NP   8 months ago Needle phobia   [place], NP   1 year ago Hypokalemia   [facility] Lada, Satira Anis, MD   1 year ago Essential hypertension, benign   [facility] Lada, Satira Anis, MD   1 year ago Essential hypertension, benign   [facility] [place], Satira Anis, MD              Passed - Last BP in normal range    BP Readings  from Last 1 Encounters:  04/29/18 120/78          Passed - Last Heart Rate in normal range    Pulse Readings from Last 1 Encounters:  04/29/18 95

## 2019-06-18 ENCOUNTER — Ambulatory Visit
Admission: RE | Admit: 2019-06-18 | Discharge: 2019-06-18 | Disposition: A | Discharge status: Home / Self Care | Payer: Medicare HMO | Source: Ambulatory Visit | Attending: Oncology | Admitting: Oncology

## 2019-06-18 ENCOUNTER — Other Ambulatory Visit: Payer: Self-pay

## 2019-06-18 DIAGNOSIS — R911 Solitary pulmonary nodule: Secondary | ICD-10-CM | POA: Diagnosis not present

## 2019-06-19 ENCOUNTER — Inpatient Hospital Stay: Payer: Medicare HMO | Attending: Oncology | Admitting: Oncology

## 2019-06-19 ENCOUNTER — Ambulatory Visit: Payer: Medicare HMO | Admitting: Oncology

## 2019-06-19 DIAGNOSIS — R911 Solitary pulmonary nodule: Secondary | ICD-10-CM

## 2019-06-19 NOTE — Progress Notes (Signed)
Pulmonary Nodule Clinic Consult note [facility]  [phone] [phone]  Patient Care Team: Arnetha Courser, MD as PCP - General (Family Medicine) Erby Pian, MD as Referring Physician (Pulmonary Disease) Neldon Labella, RN as Case Manager   Name of the patient: Andrea Bell  [id number]  [birth year]   Date of visit: 06/19/2019   Diagnosis-lung nodule  Chief complaint/ Reason for visit- Pulmonary Nodule Clinic Initial Visit  Virtual Visit via Telephone Note  I connected with Andrea Bell on 06/19/19 at  1:30 PM EDT by telephone and verified that I am speaking with the correct person using two identifiers.  Location: Patient: Home Provider: Office   I discussed the limitations, risks, security and privacy concerns of performing an evaluation and management service by telephone and the availability of in person appointments. I also discussed with the patient that there may be a patient responsible charge related to this service. The patient expressed understanding and agreed to proceed.  Past Medical History:  Patient is managed/referred by Dr. Sanda Klein.  She was evaluated by Dr. Sanda Klein in early March 2020 due to abnormal labs; elevated CO2 levels.  Checks x-ray revealed architectural distortion to right lung concerning for pulmonary nodule.  A CT chest was recommended for further clarification.  CT chest revealed right upper lobe solid pulmonary nodules that had increased in size from previous imaging from 2016.  It was recommended she have a repeat CT scan in approximately 3 to 6 months.  Repeat CT scan from 09/16/2018 revealed a stable 7 mm pulmonary nodule at the posterior right upper lobe.  Recommended additional CT follow-up in 6 to 12 months to ensure stability.  Slow-growing malignancy is not excluded.  Slight interval decrease in size of a 4 mm pulmonary nodule in the anterior right lower lobe.  Interval history-  Andrea Bell has a  past medical history of hypertension, COPD, allergic rhinitis, GERD, osteoporosis, arthritis, insomnia, anemia, dyspnea, hyperlipidemia, chronic back pain, severe needle phobia and obesity.  Her past surgical history consist of hysterectomy, cystectomy, laparoscopic bilateral salpingo-oophorectomy, tubal ligation and tear duct surgery.  Has family history of hypertension, type 2 diabetes, dementia, COPD and heart disease.  Has family history of brain tumor, breast cancer, ovarian cancer and stomach cancer.  Andrea Bell is a former smoker and quit approximately 8 years ago.  She smoked 1 pack/day for about 45 years.  Both of her parents smoked.  She does not drink alcohol or use illicit drugs.  She denies any known occupational exposure.  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  She thinks she was exposed to cotton dust. Various textile jobs have been investigated in relation to increased risk of lung cancer and the most consistent epidemiological finding were between lung cancer and cotton dust. The textile industry workers are in continuous exposure to dyes, solvents, fibre dusts and various other toxic chemicals. Given she was not in direct contact with manufacturing, places her at a lower risk.  I explained to Andrea Bell that lung cancer is multifactorial and cotton dust alone is less likelyto cause lungcancer. We discussed other occupational substances such as diesel fumes, natural fibers such as asbestos, silica and wood dust, metals including aluminum, arsenic and beryllium, radon, reactive chemicals, secondhand smoke and solvents like benzene and Toluene.Most common occupations associated with the development of lung cancer include painters, Dispensing optician work, metal work, Tourist information centre manager, Conservation officer, historic buildings, truck driving, mining, Press photographer, aluminum production and coal  gasification.  Today, patient reports feeling "fine".  She is unemployed.  She lives at  home with her husband.  Her husband continues to smoke but smokes outdoors.  Andrea Bell requires oxygen intermittently throughout the day.  She is active at home and is able to get around on her own.  She denies any activities of daily living concerns.  States she often is cleaning her house or organizing.  She denies exercise outdoors due to heat and oxygen requirements..  She denies any cough. Has exertional shortness of breath.  She denies any chest pain.  She denies any recent fevers or illnesses.  ECOG FS:1 - Symptomatic but completely ambulatory  Review of systems- Review of Systems  Constitutional: Positive for malaise/fatigue. Negative for chills, fever and weight loss.  HENT: Negative for congestion, ear pain and tinnitus.   Eyes: Negative.  Negative for blurred vision and double vision.  Respiratory: Positive for shortness of breath. Negative for cough and sputum production.   Cardiovascular: Negative.  Negative for chest pain, palpitations and leg swelling.  Gastrointestinal: Negative.  Negative for abdominal pain, constipation, diarrhea, nausea and vomiting.  Genitourinary: Negative for dysuria, frequency and urgency.  Musculoskeletal: Negative for back pain and falls.  Skin: Negative.  Negative for rash.  Neurological: Negative.  Negative for weakness and headaches.  Endo/Heme/Allergies: Negative.  Does not bruise/bleed easily.  Psychiatric/Behavioral: Negative.  Negative for depression. The patient is not nervous/anxious and does not have insomnia.      Allergies  Allergen Reactions  . Codeine Nausea And Vomiting    Was used while in hospital 02/24/14 and no reaction noted--01/06/15 [employer]     Past Medical History:  Diagnosis Date  . Allergy   . Anemia   . Chronic back pain   . Complication of anesthesia    slow to wake  . COPD (chronic obstructive pulmonary disease) (HCC)    2l home oxygen  . Dyspnea   . GERD (gastroesophageal reflux disease)   . Headache   . HTN  (hypertension)   . Hyperlipidemia   . Insomnia   . Insomnia   . Osteoarthritis   . Osteoporosis   . Oxygen deficiency   . Tachycardia   . Vertigo    none recently  . Wears dentures    partial lower     Past Surgical History:  Procedure Laterality Date  . ABDOMINAL HYSTERECTOMY    . CYST REMOVAL TRUNK    . CYSTECTOMY     back and side of head  . CYSTOSCOPY N/A 12/13/2015   Procedure: CYSTOSCOPY;  Surgeon: Malachy Mood, MD;  Location: [facility] ORS;  Service: Gynecology;  Laterality: N/A;  . LAPAROSCOPIC BILATERAL SALPINGO OOPHERECTOMY Bilateral 12/13/2015   Procedure: LAPAROSCOPIC BILATERAL SALPINGO OOPHORECTOMY;  Surgeon: Malachy Mood, MD;  Location: [facility] ORS;  Service: Gynecology;  Laterality: Bilateral;  . LAPAROSCOPIC HYSTERECTOMY N/A 12/13/2015   Procedure: HYSTERECTOMY TOTAL LAPAROSCOPIC;  Surgeon: Malachy Mood, MD;  Location: [facility] ORS;  Service: Gynecology;  Laterality: N/A;  . tear duct surgery     bilateral  . TUBAL LIGATION      Social History   Socioeconomic History  . Marital status: Married    Spouse name: Not on file  . Number of children: 2  . Years of education: Not on file  . Highest education level: 12th grade  Occupational History  . Not on file  Tobacco Use  . Smoking status: Former Smoker    Packs/day: 0.50    Years: 15.00  Pack years: 7.50    Types: Cigarettes    Quit date: 04/25/2011    Years since quitting: 8.1  . Smokeless tobacco: Never Used  Substance and Sexual Activity  . Alcohol use: Yes    Alcohol/week: 7.0 standard drinks    Types: 7 Shots of liquor per week    Comment: occasional drinks  . Drug use: No  . Sexual activity: Yes  Other Topics Concern  . Not on file  Social History Narrative   Lives at home with husband.   Social Determinants of Health   Financial Resource Strain:   . Difficulty of Paying Living Expenses:   Food Insecurity:   . Worried About Charity fundraiser in the Last Year:   . Academic librarian in the Last Year:   Transportation Needs:   . Film/video editor (Medical):   Marland Kitchen Lack of Transportation (Non-Medical):   Physical Activity:   . Days of Exercise per Week:   . Minutes of Exercise per Session:   Stress:   . Feeling of Stress :   Social Connections:   . Frequency of Communication with Friends and Family:   . Frequency of Social Gatherings with Friends and Family:   . Attends Religious Services:   . Active Member of Clubs or Organizations:   . Attends Archivist Meetings:   Marland Kitchen Marital Status:   Intimate Partner Violence:   . Fear of Current or Ex-Partner:   . Emotionally Abused:   Marland Kitchen Physically Abused:   . Sexually Abused:     Family History  Problem Relation Age of Onset  . Hypertension Mother   . Diabetes Mellitus II Mother   . Dementia Mother   . Diabetes Mother   . Cancer - Other Father        brain tumor  . Cancer Father        brain tumor  . Breast cancer Maternal Aunt 57  . Cancer Maternal Aunt        ovarian  . Breast cancer Maternal Grandmother 23  . Cancer Maternal Grandmother        breast  . Diabetes Brother   . Hypertension Brother   . COPD Brother   . Cancer Maternal Grandfather        stomach  . Heart disease Paternal Grandmother   . Stroke Paternal Grandfather      Current Outpatient Medications:  .  albuterol (PROVENTIL HFA;VENTOLIN HFA) 108 (90 Base) MCG/ACT inhaler, Inhale 2 puffs into the lungs every 6 (six) hours as needed for wheezing or shortness of breath., Disp: 1 Inhaler, Rfl: 3 .  albuterol (PROVENTIL) (2.5 MG/3ML) 0.083% nebulizer solution, Take 3 mLs (2.5 mg total) by nebulization every 4 (four) hours as needed for wheezing or shortness of breath., Disp: 150 mL, Rfl: 3 .  ALPRAZolam (XANAX) 0.5 MG tablet, One or two tablets by mouth one hour prior to procedure, dentist visit, lab draw; no alcohol; do not drive for six hours, Disp: 8 tablet, Rfl: 0 .  amLODipine (NORVASC) 5 MG tablet, Take 1 tablet (5 mg  total) by mouth 2 (two) times daily. For blood pressure, Disp: 180 tablet, Rfl: 0 .  aspirin EC 81 MG tablet, Take 81 mg by mouth daily., Disp: , Rfl:  .  atorvastatin (LIPITOR) 10 MG tablet, Take 1 tablet (10 mg total) by mouth at bedtime., Disp: 90 tablet, Rfl: 1 .  cholecalciferol (VITAMIN D3) 25 MCG (1000 UT) tablet, Take 1,000  Units by mouth daily., Disp: , Rfl:  .  escitalopram (LEXAPRO) 5 MG tablet, TAKE 1 TABLET (5 MG TOTAL) BY MOUTH DAILY., Disp: 30 tablet, Rfl: 0 .  LORazepam (ATIVAN) 0.5 MG tablet, Take 1 tablet (0.5 mg total) by mouth once as needed for up to 1 dose for anxiety (take roughly 1 hour prior to procedure/blood draw, cannot drive, needs assistance)., Disp: 1 tablet, Rfl: 0 .  meclizine (ANTIVERT) 25 MG tablet, Take 1 tablet (25 mg total) by mouth 3 (three) times daily as needed for dizziness., Disp: 30 tablet, Rfl: 2 .  metoprolol succinate (TOPROL-XL) 25 MG 24 hr tablet, Take 0.5 tablets (12.5 mg total) by mouth daily., Disp: 45 tablet, Rfl: 0 .  OXYGEN, Inhale 2 L into the lungs as needed. , Disp: , Rfl:  .  potassium chloride (KLOR-CON) 10 MEQ tablet, Take 1 tablet (10 mEq total) by mouth daily., Disp: 90 tablet, Rfl: 0 .  potassium chloride SA (KLOR-CON) 20 MEQ tablet, Take 1 tablet (20 mEq total) by mouth daily for 7 days., Disp: 30 tablet, Rfl: 0 .  senna (SENOKOT) 8.6 MG TABS tablet, Take 2 tablets by mouth every other day., Disp: , Rfl:  .  SYMBICORT 160-4.5 MCG/ACT inhaler, Inhale 2 puffs into the lungs 2 (two) times daily., Disp: 3 Inhaler, Rfl: 3 .  traZODone (DESYREL) 100 MG tablet, Take 1 tablet (100 mg total) by mouth at bedtime as needed. for sleep, Disp: 30 tablet, Rfl: 0 .  Vitamin D, Ergocalciferol, (DRISDOL) 1.25 MG (50000 UNIT) CAPS capsule, Take 1 capsule (50,000 Units total) by mouth every 7 (seven) days. x12 weeks., Disp: 12 capsule, Rfl: 0  Physical exam: There were no vitals filed for this visit. LImited d/t telephone visit   CMP Latest Ref Rng &  Units 04/29/2019  Glucose 65 - 99 mg/dL 148(H)  BUN 7 - 25 mg/dL 14  Creatinine 0.50 - 0.99 mg/dL 0.78  Sodium 135 - 146 mmol/L 143  Potassium 3.5 - 5.3 mmol/L 3.2(L)  Chloride 98 - 110 mmol/L 95(L)  CO2 20 - 32 mmol/L 35(H)  Calcium 8.6 - 10.4 mg/dL 10.0  Total Protein 6.1 - 8.1 g/dL 7.5  Total Bilirubin 0.2 - 1.2 mg/dL 1.0  Alkaline Phos 33 - 130 U/L -  AST 10 - 35 U/L 88(H)  ALT 6 - 29 U/L 41(H)   CBC Latest Ref Rng & Units 04/29/2019  WBC 3.8 - 10.8 Thousand/uL 7.0  Hemoglobin 11.7 - 15.5 g/dL 16.0(H)  Hematocrit 35.0 - 45.0 % 44.4  Platelets 140 - 400 Thousand/uL 284    No images are attached to the encounter.  CT Chest Wo Contrast  Result Date: 06/19/2019 CLINICAL DATA:  Follow-up pulmonary nodule. EXAM: CT CHEST WITHOUT CONTRAST TECHNIQUE: Multidetector CT imaging of the chest was performed following the standard protocol without IV contrast. COMPARISON:  09/16/2018 chest CT. FINDINGS: Cardiovascular: Normal heart size. No significant pericardial effusion/thickening. Atherosclerotic nonaneurysmal thoracic aorta. Normal caliber pulmonary arteries. Mediastinum/Nodes: No discrete thyroid nodules. Unremarkable esophagus. No pathologically enlarged axillary, mediastinal or hilar lymph nodes, noting limited sensitivity for the detection of hilar adenopathy on this noncontrast study. Lungs/Pleura: No pneumothorax. No pleural effusion. Moderate centrilobular emphysema with mild diffuse bronchial wall thickening. Irregular solid 0.9 x 0.7 cm posterior right upper lobe pulmonary nodule abutting the major fissure (series 3/image 63), previously 0.7 x 0.6 cm on 09/16/2018 chest CT using similar measurement technique, slightly increased. Solid 0.5 cm anterior right lower lobe pulmonary nodule (series 3/image 108) is  stable back to 05/09/2018 chest CT and considered benign. No acute consolidative airspace disease or lung masses. New mild patchy tree-in-bud opacities in basilar left lower lobe. No  new significant pulmonary nodules. Upper abdomen: Mild diffuse hepatic steatosis. Slightly irregular liver surface. Musculoskeletal: No aggressive appearing focal osseous lesions. Mild thoracic spondylosis. IMPRESSION: 1. 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 Multidisciplinary thoracic oncology consultation suggested. PET-CT may be obtained for further characterization. 2. No thoracic adenopathy. 3. New mild patchy tree-in-bud opacities in the basilar left lower lobe, compatible with nonspecific mild infectious or inflammatory bronchiolitis, with the differential including mild aspiration. 4. Mild diffuse hepatic steatosis. Slightly irregular liver surface, cannot exclude cirrhosis. Suggest correlation with liver function tests. 5. Aortic Atherosclerosis (ICD10-I70.0) and Emphysema (ICD10-J43.9). These results will be called to the ordering clinician or representative by the Radiologist Assistant, and communication documented in the PACS or [employer]. Electronically Signed   By: Ilona Sorrel M.D.   On: 06/19/2019 07:38    Assessment and plan- Patient is a 64 y.o. female who presents to our lung nodule clinic to review the results of her most recent CT scan.    CT chest completed on 06/19/2019 revealed an irregular solid 0.9 cm posterior right upper lobe pulmonary nodule abutting the major fissure, slightly increased in size since 09/16/2018 suspicious for a slow-growing primary bronchogenic carcinoma.  Multidisciplinary thoracic oncology consultation or PET scan may be obtained for further characterization.  No thoracic adenopathy.  Given patient's smoking history and increasing size of irregular nodule, would recommend a PET scan in the next week or so.  Patient agreeable.  Calculating malignancy probability of a pulmonary nodule:  Risk factors include: 1. Age. 2. Cancer history. 3. Diameter of pulmonary nodule and mm 4. Location 5. Smoking history 6. Spiculation present  Based on risk factors, this patient ishighrisk for the development of lung cancer even. I would recommend aPET CT in 1 to 2 weeks. Andrea Bell stopped smoking approximately 8 years ago.  She would meet criteria for our low-dose CT screening should nodule remained stable at next visit/imaging.   During our visit, we discussed pulmonary nodules are a common incidental finding and are often how lung cancer is discovered. Lung cancer survival is directly related to the stage at diagnosis. We discussed that nodules can vary in presentation from solitary pulmonary nodules to masses, 2 groundglass opacities and multiple nodules. Pulmonary nodules in the majority of cases are benign but the probability of these becoming malignant cannot be undermined. Early identification of malignant nodules could lead to early diagnosis and increased survival.  We discussed the probability of pulmonary nodules becoming malignant increase with age, pack years of tobacco use, size/characteristics of the nodule and location; with upper lobe involvement being most worrisome.   We discussed the goal of our clinic is to thoroughly evaluate each nodule, developed a comprehensive, individualized plan of care utilizing the most advanced technology and significantly reduce the time from detection to treatment.A dedicated pulmonary nodule clinic has proven to indeed expedite the detection and treatment of lung cancer.  Patient education in fact sheet provided along with most recent CT scans.  Plan: Review CT scan from 06/18/2019. PET scan to be scheduled in the next 1 to 2 weeks-order placed  Disposition: RTC to lung nodule clinic virtually after PET scan for results and plan of care.  Visit Diagnosis 1. Lung nodule    We discussed the assessment and  treatment plan with the patient. The patient was provided an opportunity to ask questions and all were answered. The patient agreed with the plan and demonstrated an understanding of the instructions.   The patient was advised to call back or seek an in-person evaluation if the symptoms worsen or if the condition fails to improve as anticipated.  I provided 25 minutes of non-face-to-face time during this encounter.  Greater than 50% was spent in counseling and coordination of care with this patient including but not limited to discussion of the relevant topics above (See A&P) including, but not limited to diagnosis and management of acute and chronic medical conditions.   Thank you for allowing me to participate in the care of this very pleasant patient.   Jacquelin Hawking, NP [place] [facility] - [phone] Pager- [phone] 06/19/2019 2:11 PM   CC: Lonia Blood, NP

## 2019-06-20 DIAGNOSIS — J449 Chronic obstructive pulmonary disease, unspecified: Secondary | ICD-10-CM | POA: Diagnosis not present

## 2019-06-22 ENCOUNTER — Telehealth: Payer: Self-pay | Admitting: *Deleted

## 2019-06-22 NOTE — Telephone Encounter (Signed)
Pt informed of upcoming appts for PET scan on 5/5 at 11:30am with 11am arrival and virtual visit with Sonia Baller on Thurs 5/6 at 2:30pm. Pt verbalized understanding. Nothing further needed at this time.

## 2019-06-24 ENCOUNTER — Ambulatory Visit
Admission: RE | Admit: 2019-06-24 | Discharge: 2019-06-24 | Disposition: A | Discharge status: Home / Self Care | Payer: Medicare HMO | Source: Ambulatory Visit | Attending: Oncology | Admitting: Oncology

## 2019-06-24 ENCOUNTER — Other Ambulatory Visit: Payer: Self-pay

## 2019-06-24 DIAGNOSIS — J439 Emphysema, unspecified: Secondary | ICD-10-CM | POA: Diagnosis not present

## 2019-06-24 DIAGNOSIS — K760 Fatty (change of) liver, not elsewhere classified: Secondary | ICD-10-CM | POA: Diagnosis not present

## 2019-06-24 DIAGNOSIS — I700 Atherosclerosis of aorta: Secondary | ICD-10-CM | POA: Insufficient documentation

## 2019-06-24 DIAGNOSIS — R911 Solitary pulmonary nodule: Secondary | ICD-10-CM | POA: Diagnosis not present

## 2019-06-24 LAB — GLUCOSE, CAPILLARY: Glucose-Capillary: 123 mg/dL — ABNORMAL HIGH (ref 70–99)

## 2019-06-24 MED ORDER — FLUDEOXYGLUCOSE F - 18 (FDG) INJECTION
8.4900 | Freq: Once | INTRAVENOUS | Status: AC | PRN
  Administered 2019-06-24: 11:00:00 8.49 via INTRAVENOUS

## 2019-06-25 ENCOUNTER — Telehealth: Payer: Self-pay | Admitting: Family Medicine

## 2019-06-25 ENCOUNTER — Inpatient Hospital Stay: Payer: Medicare HMO | Attending: Oncology | Admitting: Oncology

## 2019-06-25 DIAGNOSIS — R911 Solitary pulmonary nodule: Secondary | ICD-10-CM | POA: Diagnosis not present

## 2019-06-25 NOTE — Progress Notes (Signed)
Pulmonary Nodule Clinic Consult note [facility]  Telephone:([phone] [phone]  Patient Care Team: Arnetha Courser, MD as PCP - General (Family Medicine) Erby Pian, MD as Referring Physician (Pulmonary Disease) Neldon Labella, RN as Case Manager   Name of the patient: Andrea Bell  [id number]  1955-03-07   Date of visit: 06/25/2019   Diagnosis-lung nodule  Chief complaint/ Reason for visit- Pulmonary Nodule Clinic Initial Visit  Virtual Visit via Telephone Note  I connected with Andrea Bell on 06/25/19 at  2:30 PM EDT by telephone and verified that I am speaking with the correct person using two identifiers.  Location: Patient: Home Provider: Office   I discussed the limitations, risks, security and privacy concerns of performing an evaluation and management service by telephone and the availability of in person appointments. I also discussed with the patient that there may be a patient responsible charge related to this service. The patient expressed understanding and agreed to proceed.  Past Medical History:  Patient is managed/referred by Dr. Sanda Klein.  She was evaluated by Dr. Sanda Klein in early March 2020 due to abnormal labs; elevated CO2 levels.  Checks x-ray revealed architectural distortion to right lung concerning for pulmonary nodule.  A CT chest was recommended for further clarification.  CT chest revealed right upper lobe solid pulmonary nodules that had increased in size from previous imaging from 2016.  It was recommended she have a repeat CT scan in approximately 3 to 6 months.  CT scan from 09/16/2018 revealed a stable 7 mm pulmonary nodule at the posterior right upper lobe.  Recommended additional CT follow-up in 6 to 12 months to ensure stability.  Slow-growing malignancy is not excluded.  Slight interval decrease in size of a 4 mm pulmonary nodule in the anterior right lower lobe.  Interval CT scan from 06/18/2019 for  follow-up of previously noted pulmonary nodule noted in irregular solid 0.9 cm posterior right upper lobe pulmonary nodule abutting the major fissure, slightly increased in size since 09/08/2018 chest CT, suspicious for slow-growing primary bronchogenic carcinoma.  Multidisciplinary thoracic oncology consultation suggested.  PET CT may be obtained for further characterization.  PET scan ordered.  Interval history-  Andrea Bell has a past medical history of hypertension, COPD, allergic rhinitis, GERD, osteoporosis, arthritis, insomnia, anemia, dyspnea, hyperlipidemia, chronic back pain, severe needle phobia and obesity.  Her past surgical history consist of hysterectomy, cystectomy, laparoscopic bilateral salpingo-oophorectomy, tubal ligation and tear duct surgery.  Has family history of hypertension, type 2 diabetes, dementia, COPD and heart disease.  Has family history of brain tumor, breast cancer, ovarian cancer and stomach cancer.  Andrea Bell is a former smoker and quit approximately 8 years ago.  She smoked 1 pack/day for about 45 years.  Both of her parents smoked.  She does not drink alcohol or use illicit drugs.  She denies any known occupational exposure.  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  She thinks she was exposed to cotton dust. Various textile jobs have been investigated in relation to increased risk of lung cancer and the most consistent epidemiological finding were between lung cancer and cotton dust. The textile industry workers are in continuous exposure to dyes, solvents, fibre dusts and various other toxic chemicals. Given she was not in direct contact with manufacturing, places her at a lower risk.  I explained to Andrea Bell that lung cancer is multifactorial and cotton dust alone is less likelyto cause lungcancer.  We discussed other occupational substances such as diesel fumes, natural fibers such as asbestos, silica and wood dust,  metals including aluminum, arsenic and beryllium, radon, reactive chemicals, secondhand smoke and solvents like benzene and Toluene.Most common occupations associated with the development of lung cancer include painters, Dispensing optician work, metal work, Tourist information centre manager, Conservation officer, historic buildings, truck driving, Chemical engineer, Press photographer, aluminum Engineer, civil (consulting).  Today, Andrea Bell presents to review PET scan.  She continues to feel well and offers no new complaints.  ECOG FS:1 - Symptomatic but completely ambulatory  Review of systems- Review of Systems  Constitutional: Positive for malaise/fatigue. Negative for chills, fever and weight loss.  HENT: Negative for congestion, ear pain and tinnitus.   Eyes: Negative.  Negative for blurred vision and double vision.  Respiratory: Positive for shortness of breath. Negative for cough and sputum production.   Cardiovascular: Negative.  Negative for chest pain, palpitations and leg swelling.  Gastrointestinal: Negative.  Negative for abdominal pain, constipation, diarrhea, nausea and vomiting.  Genitourinary: Negative for dysuria, frequency and urgency.  Musculoskeletal: Negative for back pain and falls.  Skin: Negative.  Negative for rash.  Neurological: Negative.  Negative for weakness and headaches.  Endo/Heme/Allergies: Negative.  Does not bruise/bleed easily.  Psychiatric/Behavioral: Negative.  Negative for depression. The patient is not nervous/anxious and does not have insomnia.      Allergies  Allergen Reactions  . Codeine Nausea And Vomiting    Was used while in hospital 02/24/14 and no reaction noted--01/06/15 [facility]     Past Medical History:  Diagnosis Date  . Allergy   . Anemia   . Chronic back pain   . Complication of anesthesia    slow to wake  . COPD (chronic obstructive pulmonary disease) (HCC)    2l home oxygen  . Dyspnea   . GERD (gastroesophageal reflux disease)   . Headache   . HTN (hypertension)   . Hyperlipidemia   . Insomnia    . Insomnia   . Osteoarthritis   . Osteoporosis   . Oxygen deficiency   . Tachycardia   . Vertigo    none recently  . Wears dentures    partial lower     Past Surgical History:  Procedure Laterality Date  . ABDOMINAL HYSTERECTOMY    . CYST REMOVAL TRUNK    . CYSTECTOMY     back and side of head  . CYSTOSCOPY N/A 12/13/2015   Procedure: CYSTOSCOPY;  Surgeon: Malachy Mood, MD;  Location: [facility] ORS;  Service: Gynecology;  Laterality: N/A;  . LAPAROSCOPIC BILATERAL SALPINGO OOPHERECTOMY Bilateral 12/13/2015   Procedure: LAPAROSCOPIC BILATERAL SALPINGO OOPHORECTOMY;  Surgeon: Malachy Mood, MD;  Location: [facility] ORS;  Service: Gynecology;  Laterality: Bilateral;  . LAPAROSCOPIC HYSTERECTOMY N/A 12/13/2015   Procedure: HYSTERECTOMY TOTAL LAPAROSCOPIC;  Surgeon: Malachy Mood, MD;  Location: [facility] ORS;  Service: Gynecology;  Laterality: N/A;  . tear duct surgery     bilateral  . TUBAL LIGATION      Social History   Socioeconomic History  . Marital status: Married    Spouse name: Not on file  . Number of children: 2  . Years of education: Not on file  . Highest education level: 12th grade  Occupational History  . Not on file  Tobacco Use  . Smoking status: Former Smoker    Packs/day: 0.50    Years: 15.00    Pack years: 7.50    Types: Cigarettes    Quit date: 04/25/2011    Years since quitting: 8.1  .  Smokeless tobacco: Never Used  Substance and Sexual Activity  . Alcohol use: Yes    Alcohol/week: 7.0 standard drinks    Types: 7 Shots of liquor per week    Comment: occasional drinks  . Drug use: No  . Sexual activity: Yes  Other Topics Concern  . Not on file  Social History Narrative   Lives at home with husband.   Social Determinants of Health   Financial Resource Strain:   . Difficulty of Paying Living Expenses:   Food Insecurity:   . Worried About Charity fundraiser in the Last Year:   . Arboriculturist in the Last Year:   Transportation Needs:   .  Film/video editor (Medical):   Marland Kitchen Lack of Transportation (Non-Medical):   Physical Activity:   . Days of Exercise per Week:   . Minutes of Exercise per Session:   Stress:   . Feeling of Stress :   Social Connections:   . Frequency of Communication with Friends and Family:   . Frequency of Social Gatherings with Friends and Family:   . Attends Religious Services:   . Active Member of Clubs or Organizations:   . Attends Archivist Meetings:   Marland Kitchen Marital Status:   Intimate Partner Violence:   . Fear of Current or Ex-Partner:   . Emotionally Abused:   Marland Kitchen Physically Abused:   . Sexually Abused:     Family History  Problem Relation Age of Onset  . Hypertension Mother   . Diabetes Mellitus II Mother   . Dementia Mother   . Diabetes Mother   . Cancer - Other Father        brain tumor  . Cancer Father        brain tumor  . Breast cancer Maternal Aunt 57  . Cancer Maternal Aunt        ovarian  . Breast cancer Maternal Grandmother 63  . Cancer Maternal Grandmother        breast  . Diabetes Brother   . Hypertension Brother   . COPD Brother   . Cancer Maternal Grandfather        stomach  . Heart disease Paternal Grandmother   . Stroke Paternal Grandfather      Current Outpatient Medications:  .  albuterol (PROVENTIL HFA;VENTOLIN HFA) 108 (90 Base) MCG/ACT inhaler, Inhale 2 puffs into the lungs every 6 (six) hours as needed for wheezing or shortness of breath., Disp: 1 Inhaler, Rfl: 3 .  albuterol (PROVENTIL) (2.5 MG/3ML) 0.083% nebulizer solution, Take 3 mLs (2.5 mg total) by nebulization every 4 (four) hours as needed for wheezing or shortness of breath., Disp: 150 mL, Rfl: 3 .  ALPRAZolam (XANAX) 0.5 MG tablet, One or two tablets by mouth one hour prior to procedure, dentist visit, lab draw; no alcohol; do not drive for six hours, Disp: 8 tablet, Rfl: 0 .  amLODipine (NORVASC) 5 MG tablet, Take 1 tablet (5 mg total) by mouth 2 (two) times daily. For blood pressure,  Disp: 180 tablet, Rfl: 0 .  aspirin EC 81 MG tablet, Take 81 mg by mouth daily., Disp: , Rfl:  .  atorvastatin (LIPITOR) 10 MG tablet, Take 1 tablet (10 mg total) by mouth at bedtime., Disp: 90 tablet, Rfl: 1 .  cholecalciferol (VITAMIN D3) 25 MCG (1000 UT) tablet, Take 1,000 Units by mouth daily., Disp: , Rfl:  .  escitalopram (LEXAPRO) 5 MG tablet, TAKE 1 TABLET (5 MG TOTAL) BY MOUTH  DAILY., Disp: 30 tablet, Rfl: 0 .  LORazepam (ATIVAN) 0.5 MG tablet, Take 1 tablet (0.5 mg total) by mouth once as needed for up to 1 dose for anxiety (take roughly 1 hour prior to procedure/blood draw, cannot drive, needs assistance)., Disp: 1 tablet, Rfl: 0 .  meclizine (ANTIVERT) 25 MG tablet, Take 1 tablet (25 mg total) by mouth 3 (three) times daily as needed for dizziness., Disp: 30 tablet, Rfl: 2 .  metoprolol succinate (TOPROL-XL) 25 MG 24 hr tablet, Take 0.5 tablets (12.5 mg total) by mouth daily., Disp: 45 tablet, Rfl: 0 .  OXYGEN, Inhale 2 L into the lungs as needed. , Disp: , Rfl:  .  potassium chloride (KLOR-CON) 10 MEQ tablet, Take 1 tablet (10 mEq total) by mouth daily., Disp: 90 tablet, Rfl: 0 .  potassium chloride SA (KLOR-CON) 20 MEQ tablet, Take 1 tablet (20 mEq total) by mouth daily for 7 days., Disp: 30 tablet, Rfl: 0 .  senna (SENOKOT) 8.6 MG TABS tablet, Take 2 tablets by mouth every other day., Disp: , Rfl:  .  SYMBICORT 160-4.5 MCG/ACT inhaler, Inhale 2 puffs into the lungs 2 (two) times daily., Disp: 3 Inhaler, Rfl: 3 .  traZODone (DESYREL) 100 MG tablet, Take 1 tablet (100 mg total) by mouth at bedtime as needed. for sleep, Disp: 30 tablet, Rfl: 0 .  Vitamin D, Ergocalciferol, (DRISDOL) 1.25 MG (50000 UNIT) CAPS capsule, Take 1 capsule (50,000 Units total) by mouth every 7 (seven) days. x12 weeks., Disp: 12 capsule, Rfl: 0  Physical exam: There were no vitals filed for this visit. LImited d/t telephone visit   CMP Latest Ref Rng & Units 04/29/2019  Glucose 65 - 99 mg/dL 148(H)  BUN 7 - 25  mg/dL 14  Creatinine 0.50 - 0.99 mg/dL 0.78  Sodium 135 - 146 mmol/L 143  Potassium 3.5 - 5.3 mmol/L 3.2(L)  Chloride 98 - 110 mmol/L 95(L)  CO2 20 - 32 mmol/L 35(H)  Calcium 8.6 - 10.4 mg/dL 10.0  Total Protein 6.1 - 8.1 g/dL 7.5  Total Bilirubin 0.2 - 1.2 mg/dL 1.0  Alkaline Phos 33 - 130 U/L -  AST 10 - 35 U/L 88(H)  ALT 6 - 29 U/L 41(H)   CBC Latest Ref Rng & Units 04/29/2019  WBC 3.8 - 10.8 Thousand/uL 7.0  Hemoglobin 11.7 - 15.5 g/dL 16.0(H)  Hematocrit 35.0 - 45.0 % 44.4  Platelets 140 - 400 Thousand/uL 284    No images are attached to the encounter.  CT Chest Wo Contrast  Result Date: 06/19/2019 CLINICAL DATA:  Follow-up pulmonary nodule. EXAM: CT CHEST WITHOUT CONTRAST TECHNIQUE: Multidetector CT imaging of the chest was performed following the standard protocol without IV contrast. COMPARISON:  09/16/2018 chest CT. FINDINGS: Cardiovascular: Normal heart size. No significant pericardial effusion/thickening. Atherosclerotic nonaneurysmal thoracic aorta. Normal caliber pulmonary arteries. Mediastinum/Nodes: No discrete thyroid nodules. Unremarkable esophagus. No pathologically enlarged axillary, mediastinal or hilar lymph nodes, noting limited sensitivity for the detection of hilar adenopathy on this noncontrast study. Lungs/Pleura: No pneumothorax. No pleural effusion. Moderate centrilobular emphysema with mild diffuse bronchial wall thickening. Irregular solid 0.9 x 0.7 cm posterior right upper lobe pulmonary nodule abutting the major fissure (series 3/image 63), previously 0.7 x 0.6 cm on 09/16/2018 chest CT using similar measurement technique, slightly increased. Solid 0.5 cm anterior right lower lobe pulmonary nodule (series 3/image 108) is stable back to 05/09/2018 chest CT and considered benign. No acute consolidative airspace disease or lung masses. New mild patchy tree-in-bud opacities in  basilar left lower lobe. No new significant pulmonary nodules. Upper abdomen: Mild diffuse  hepatic steatosis. Slightly irregular liver surface. Musculoskeletal: No aggressive appearing focal osseous lesions. Mild thoracic spondylosis. IMPRESSION: 1. 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 Multidisciplinary thoracic oncology consultation suggested. PET-CT may be obtained for further characterization. 2. No thoracic adenopathy. 3. New mild patchy tree-in-bud opacities in the basilar left lower lobe, compatible with nonspecific mild infectious or inflammatory bronchiolitis, with the differential including mild aspiration. 4. Mild diffuse hepatic steatosis. Slightly irregular liver surface, cannot exclude cirrhosis. Suggest correlation with liver function tests. 5. Aortic Atherosclerosis (ICD10-I70.0) and Emphysema (ICD10-J43.9). These results will be called to the ordering clinician or representative by the Radiologist Assistant, and communication documented in the PACS or [employer]. Electronically Signed   By: Ilona Sorrel M.D.   On: 06/19/2019 07:38   NM PET Image Initial (PI) Skull Base To Thigh  Result Date: 06/24/2019 CLINICAL DATA:  Initial treatment strategy for slowly enlarging posterior right upper lobe pulmonary nodule. No recent COVID-19 vaccine. EXAM: NUCLEAR MEDICINE PET SKULL BASE TO THIGH TECHNIQUE: 8.5 mCi F-18 FDG was injected intravenously. Full-ring PET imaging was performed from the skull base to thigh after the radiotracer. CT data was obtained and used for attenuation correction and anatomic localization. Fasting blood glucose: 123 mg/dl COMPARISON:  Chest CTs, most recent 06/18/2019. FINDINGS: Mediastinal blood pool activity: SUV max 2.1 Liver activity: SUV max NA NECK: No areas of abnormal hypermetabolism. Incidental CT findings: Left carotid atherosclerosis. No cervical adenopathy. Presumed sebaceous cyst  about the posterior right neck at 1.2 cm on 56/3. CHEST: The posterior right upper lobe pleural-based nodular density measures 8 mm and is not significantly hypermetabolic on [id number]. No thoracic nodal hypermetabolism. Incidental CT findings: Moderate centrilobular emphysema. Mild cardiomegaly. Aortic atherosclerosis. ABDOMEN/PELVIS: No abdominopelvic parenchymal or nodal hypermetabolism. Incidental CT findings: Heterogeneous hepatic steatosis. Suspicion of mild cirrhosis. Posterior gastric diverticulum. Normal adrenal glands. Abdominal aortic atherosclerosis. Underdistended right colon. Hysterectomy. SKELETON: No abnormal marrow activity. Incidental CT findings: none IMPRESSION: 1. No hypermetabolism to correspond to the posterior right upper lobe pulmonary nodule, which is at the low end of PET resolution. This remains indeterminate. Recommend CT follow-up at 3-6 months. 2. Aortic atherosclerosis (ICD10-I70.0) and emphysema (ICD10-J43.9). 3. Heterogeneous hepatic steatosis. Possible mild cirrhosis. Correlate with risk factors. Electronically Signed   By: Abigail Miyamoto M.D.   On: 06/24/2019 13:33    Assessment and plan- Patient is a 64 y.o. female who presents to our lung nodule clinic to review the results of her PET scan.  PET scan from 06/24/2019 showed no hypermetabolism to correspond to the posterior right upper lobe pulmonary nodule which is at the low end of PET resolution.  This remains indeterminate.  Recommend CT follow-up at 3 to 6 months.  Calculating malignancy probability of a pulmonary nodule: Risk factors include: 1. Age. 2. Cancer history. 3. Diameter of pulmonary nodule and mm 4. Location 5. Smoking history 6. Spiculation present  Based on risk factors, this patient ishighrisk for the development of lung cancer. I would recommend a repeat CT chest w/out contrast in 5-6 months.   She would meet criteria for our low-dose CT screening should nodule remained stable at next  visit/imaging.   During our visit, we discussed pulmonary nodules are a common incidental finding and are often how lung cancer is discovered. Lung cancer survival  is directly related to the stage at diagnosis. We discussed that nodules can vary in presentation from solitary pulmonary nodules to masses, 2 groundglass opacities and multiple nodules. Pulmonary nodules in the majority of cases are benign but the probability of these becoming malignant cannot be undermined. Early identification of malignant nodules could lead to early diagnosis and increased survival.  We discussed the probability of pulmonary nodules becoming malignant increase with age, pack years of tobacco use, size/characteristics of the nodule and location; with upper lobe involvement being most worrisome.   We discussed the goal of our clinic is to thoroughly evaluate each nodule, developed a comprehensive, individualized plan of care utilizing the most advanced technology and significantly reduce the time from detection to treatment.A dedicated pulmonary nodule clinic has proven to indeed expedite the detection and treatment of lung cancer.  Patient education in fact sheet provided along with most recent CT scans.  Plan: Review Pet scan from 06/24/19.  Repeat CT chest in 5-6 months.   Disposition: RTC after repeat CT chest.    Visit Diagnosis  1. Lung nodule      We discussed the assessment and treatment plan with the patient. The patient was provided an opportunity to ask questions and all were answered. The patient agreed with the plan and demonstrated an understanding of the instructions.   The patient was advised to call back or seek an in-person evaluation if the symptoms worsen or if the condition fails to improve as anticipated.  I provided 25 minutes of non-face-to-face time during this encounter.  Greater than 50% was spent in counseling and coordination of care with this patient including but not  limited to discussion of the relevant topics above (See A&P) including, but not limited to diagnosis and management of acute and chronic medical conditions.   Thank you for allowing me to participate in the care of this very pleasant patient.   Jacquelin Hawking, NP [place] [facility] Cell - [phone] Pager- [phone] 06/25/2019 2:41 PM   CC: Lonia Blood, NP

## 2019-06-26 NOTE — Telephone Encounter (Signed)
lvm for scheduling °

## 2019-07-21 DIAGNOSIS — J449 Chronic obstructive pulmonary disease, unspecified: Secondary | ICD-10-CM | POA: Diagnosis not present

## 2019-07-22 ENCOUNTER — Telehealth: Payer: Self-pay | Admitting: *Deleted

## 2019-07-22 NOTE — Telephone Encounter (Signed)
Message left with patient to review upcoming appts in the lung nodule clinic. Instructed to call back.

## 2019-08-07 DIAGNOSIS — R0600 Dyspnea, unspecified: Secondary | ICD-10-CM | POA: Diagnosis not present

## 2019-08-07 DIAGNOSIS — Z9981 Dependence on supplemental oxygen: Secondary | ICD-10-CM | POA: Diagnosis not present

## 2019-08-07 DIAGNOSIS — J439 Emphysema, unspecified: Secondary | ICD-10-CM | POA: Diagnosis not present

## 2019-08-20 DIAGNOSIS — J449 Chronic obstructive pulmonary disease, unspecified: Secondary | ICD-10-CM | POA: Diagnosis not present

## 2019-09-01 ENCOUNTER — Telehealth: Payer: Self-pay | Admitting: *Deleted

## 2019-09-01 NOTE — Telephone Encounter (Signed)
Left message with pt regarding upcoming appts for CT scan and follow up tele visit with Sonia Baller. Pt informed that appts will be mailed as well and instructed to call back with any questions or if needs to change appts.

## 2019-09-20 DIAGNOSIS — J449 Chronic obstructive pulmonary disease, unspecified: Secondary | ICD-10-CM | POA: Diagnosis not present

## 2019-10-21 ENCOUNTER — Telehealth: Payer: Self-pay

## 2019-10-21 DIAGNOSIS — J449 Chronic obstructive pulmonary disease, unspecified: Secondary | ICD-10-CM | POA: Diagnosis not present

## 2019-10-21 NOTE — Telephone Encounter (Signed)
Pt needs appt for refills

## 2019-10-21 NOTE — Telephone Encounter (Signed)
Appt scheduled with Dr Roxan Hockey

## 2019-10-23 ENCOUNTER — Ambulatory Visit: Payer: Medicare HMO | Admitting: Internal Medicine

## 2019-10-23 ENCOUNTER — Other Ambulatory Visit: Payer: Self-pay

## 2019-10-23 DIAGNOSIS — I10 Essential (primary) hypertension: Secondary | ICD-10-CM

## 2019-10-23 DIAGNOSIS — F321 Major depressive disorder, single episode, moderate: Secondary | ICD-10-CM

## 2019-10-23 MED ORDER — METOPROLOL SUCCINATE ER 25 MG PO TB24: 12.5000 mg | ORAL_TABLET | Freq: Every day | ORAL | 1 refills | Status: DC

## 2019-10-23 MED ORDER — TRAZODONE HCL 100 MG PO TABS
100.0000 mg | ORAL_TABLET | Freq: Every evening | ORAL | 1 refills | Status: DC | PRN
Start: 2019-10-23 — End: 2019-10-28

## 2019-10-23 MED ORDER — AMLODIPINE BESYLATE 5 MG PO TABS: 5.0000 mg | ORAL_TABLET | Freq: Two times a day (BID) | ORAL | 1 refills | Status: DC

## 2019-10-23 MED ORDER — ESCITALOPRAM OXALATE 5 MG PO TABS: 5.0000 mg | ORAL_TABLET | Freq: Every day | ORAL | 1 refills | Status: DC

## 2019-10-23 MED ORDER — SYMBICORT 160-4.5 MCG/ACT IN AERO
2.0000 | INHALATION_SPRAY | Freq: Two times a day (BID) | RESPIRATORY_TRACT | 3 refills | Status: DC
Start: 2019-10-23 — End: 2019-10-28

## 2019-10-23 NOTE — Telephone Encounter (Signed)
Patient scheduled to see Dr. Roxan Hockey on 10/28/2019.

## 2019-10-27 NOTE — Progress Notes (Signed)
Patient ID: Andrea Bell, female    DOB: Jul 23, 1955, 64 y.o.   MRN: [id number]  PCP: Towanda Malkin, MD  Chief Complaint  Patient presents with  . Follow-up  . Medication Refill    Subjective:   Andrea Bell is a 64 y.o. female, presents to clinic with CC of the following:  Chief Complaint  Patient presents with  . Follow-up  . Medication Refill    HPI:  Patient is a 64 year old female Last visit with a provider at [facility] was in September 2020 She did have the labs obtained in March 2021 with Delsa Grana following up with her, and a follow-up appointment recommended after those labs, although that was never obtained. Leisa's communication about those labs was as follows:  Vitamin D is low and parathyroid hormone is high sent in a prescription vitamin D supplement Potassium is low- supplement sent in - we need to recheck labs in 1-2 weeks (can send in more meds for anxiety) potassium was sent in  Hemoglobin and hematocrit are high Her cholesterol is much higher than before A1C is great, decreased since last labs, at goal She has elevated LFTs  needs appt to discuss abnormal labs, This appointment did not occur, with no follow-up in the recent past.  She returns today for refills, denies any sx's  Hypertension Medication regimen-amlodipine 5 mg BID (dosed by previous PCP due to increased BP in the mornings), metoprolol 12.5 mg daily.  Takes medications and not yet taken today   does not check blood pressures at home Notes often high at docs offices  BP Readings from Last 3 Encounters:  10/28/19 (!) 152/84  04/29/18 120/78  04/17/18 (!) 140/96    Denies chest pains, palpitations, increased lower extremity swelling, increased headaches, vision changes  Hyperlipidemia Medication regimen- atorvastatin 10 mg no longer taking, "been awhile"  Just stopped on own, no reason given Likely why her numbers increased recent past Lab Results  Component Value  Date   CHOL 249 (H) 04/29/2019   HDL 76 04/29/2019   LDLCALC 148 (H) 04/29/2019   TRIG 130 04/29/2019   CHOLHDL 3.3 04/29/2019   Refilled this medicine today  COPD -centrilobular emphysema Currently follows with pulmonary, last visit in June 2021, with a 65-month follow-up recommended Smoking history: She quit smoking in 2013 Inhalers: Symbicort 2 puffs twice daily,  albuterol PRN, she wears oxygen 2L PRN.  Notes chronic shortness of breath, wheezing Mild increased hemoglobin Noted on last lab check, possibly related to emphysema.  Lab Results  Component Value Date   WBC 7.0 04/29/2019   HGB 16.0 (H) 04/29/2019   HCT 44.4 04/29/2019   MCV 98.2 04/29/2019   PLT 284 04/29/2019    Pulmonary nodule  - 7 mm pulmonary nodule noted in routine CT screening followed by oncology, last follow-up was in May 2021 with the following noted:  PET scan from 06/24/2019 showed no hypermetabolism to correspond to the posterior right upper lobe pulmonary nodule which is at the low end of PET resolution.  This remains indeterminate.  Recommend CT follow-up at 3 to 6 months  Generalized anxiety disorder/severe needle phobia/insomnia Medication regimen-Lexapro 5 mg daily - no longer taking - states not needed as doing well, (previously on Effexor 37.5 mg daily, it didn't help her so she stopped), Is taking trazadone- 100mg  for sleep and states very helpful She has a severe needle phobia and takes 0.5 to 1 mg of Xanax before appointments requiring needles such as  lab draws and dental appointments. States cannot get labs today as not take her pill before, and if we do order labs, she needs to have a refill of her Xanax to take before she has them done (notes that her husband drives her and she does not drive when discussed concerns taking it before going to appointments)   Hypokalemia Noted on last lab check Has been out of potassium supplement for a while  Lab Results  Component Value Date   CREATININE  0.78 04/29/2019   BUN 14 04/29/2019   NA 143 04/29/2019   K 3.2 (L) 04/29/2019   CL 95 (L) 04/29/2019   CO2 35 (H) 04/29/2019   Increased transaminases Noted on last lab check,  She does drink alcohol, noted occasional/fairly frequent. Lab Results  Component Value Date   ALT 41 (H) 04/29/2019   AST 88 (H) 04/29/2019   ALKPHOS 85 10/09/2016   BILITOT 1.0 04/29/2019   Prediabetes Last A1c improved some Lab Results  Component Value Date   HGBA1C 5.7 (H) 04/29/2019   HGBA1C 6.0 (H) 05/01/2018   HGBA1C 6.0 (H) 04/24/2018   Lab Results  Component Value Date   LDLCALC 148 (H) 04/29/2019   CREATININE 0.78 04/29/2019   Overweight Wt Readings from Last 3 Encounters:  10/28/19 157 lb 11.2 oz (71.5 kg)  04/29/18 164 lb 14.4 oz (74.8 kg)  04/17/18 166 lb (75.3 kg)   Doing well with weight noted Continue to try to lose weight, intentional and does treadmill every other day  Vitamin D deficiency Medication regimen-not take any med to help when recommended previously Lab Results  Component Value Date   VD25OH 10 (L) 04/29/2019    Bone density scan Completed march 2020- showed normal bones.  Takes vitamin D and calcium supplementation  Patient Active Problem List   Diagnosis Date Noted  . Aortic atherosclerosis ([place]) 10/21/2018  . Estrogen deficiency 04/18/2018  . History of tobacco use 04/18/2018  . Hypercalcemia 04/18/2018  . Allergic rhinitis 05/09/2017  . Overweight (BMI 25.0-29.9) 05/09/2017  . 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 Abnormal liver function tests 10/16/2016  . Abnormal EKG 11/24/2015  . Hyperlipidemia 10/11/2015  . Steroid-induced hyperglycemia 04/15/2015  . Arthritis 04/15/2015  . Insomnia 04/15/2015  . Chronic bilateral low back pain 04/06/2015  . Medication monitoring encounter 04/06/2015  . Essential hypertension, benign 04/06/2015  . Supplemental oxygen dependent 04/06/2015  . COPD (chronic obstructive pulmonary disease) ([place]) 01/16/2015       Current Outpatient Medications:  .  albuterol (PROVENTIL HFA;VENTOLIN HFA) 108 (90 Base) MCG/ACT inhaler, Inhale 2 puffs into the lungs every 6 (six) hours as needed for wheezing or shortness of breath., Disp: 1 Inhaler, Rfl: 3 .  albuterol (PROVENTIL) (2.5 MG/3ML) 0.083% nebulizer solution, Take 3 mLs (2.5 mg total) by nebulization every 4 (four) hours as needed for wheezing or shortness of breath., Disp: 150 mL, Rfl: 3 .  amLODipine (NORVASC) 5 MG tablet, Take 1 tablet (5 mg total) by mouth 2 (two) times daily. For blood pressure, Disp: 180 tablet, Rfl: 1 .  aspirin EC 81 MG tablet, Take 81 mg by mouth daily., Disp: , Rfl:  .  meclizine (ANTIVERT) 25 MG tablet, Take 1 tablet (25 mg total) by mouth 3 (three) times daily as needed for dizziness., Disp: 30 tablet, Rfl: 2 .  metoprolol succinate (TOPROL-XL) 25 MG 24 hr tablet, Take 0.5 tablets (12.5 mg total) by mouth daily., Disp: 45 tablet, Rfl: 1 .  OXYGEN, Inhale 2 L  into the lungs as needed. , Disp: , Rfl:  .  SYMBICORT 160-4.5 MCG/ACT inhaler, Inhale 2 puffs into the lungs 2 (two) times daily., Disp: 10.2 g, Rfl: 3 .  traZODone (DESYREL) 100 MG tablet, Take 1 tablet (100 mg total) by mouth at bedtime as needed. for sleep, Disp: 90 tablet, Rfl: 1 .  ALPRAZolam (XANAX) 0.5 MG tablet, One or two tablets by mouth one hour prior to procedure, dentist visit, lab draw; no alcohol; do not drive for six hours, Disp: 8 tablet, Rfl: 0 .  atorvastatin (LIPITOR) 10 MG tablet, Take 1 tablet (10 mg total) by mouth at bedtime., Disp: 90 tablet, Rfl: 1 .  potassium chloride SA (KLOR-CON) 20 MEQ tablet, Take 1 tablet (20 mEq total) by mouth daily for 7 days., Disp: 30 tablet, Rfl: 0   Allergies  Allergen Reactions  . Codeine Nausea And Vomiting    Was used while in hospital 02/24/14 and no reaction noted--01/06/15 [facility]     Past Surgical History:  Procedure Laterality Date  . ABDOMINAL HYSTERECTOMY    . CYST REMOVAL TRUNK    . CYSTECTOMY     back  and side of head  . CYSTOSCOPY N/A 12/13/2015   Procedure: CYSTOSCOPY;  Surgeon: Malachy Mood, MD;  Location: [facility] ORS;  Service: Gynecology;  Laterality: N/A;  . LAPAROSCOPIC BILATERAL SALPINGO OOPHERECTOMY Bilateral 12/13/2015   Procedure: LAPAROSCOPIC BILATERAL SALPINGO OOPHORECTOMY;  Surgeon: Malachy Mood, MD;  Location: [facility] ORS;  Service: Gynecology;  Laterality: Bilateral;  . LAPAROSCOPIC HYSTERECTOMY N/A 12/13/2015   Procedure: HYSTERECTOMY TOTAL LAPAROSCOPIC;  Surgeon: Malachy Mood, MD;  Location: [facility] ORS;  Service: Gynecology;  Laterality: N/A;  . tear duct surgery     bilateral  . TUBAL LIGATION       Family History  Problem Relation Age of Onset  . Hypertension Mother   . Diabetes Mellitus II Mother   . Dementia Mother   . Diabetes Mother   . Cancer - Other Father        brain tumor  . Cancer Father        brain tumor  . Breast cancer Maternal Aunt 57  . Cancer Maternal Aunt        ovarian  . Breast cancer Maternal Grandmother 66  . Cancer Maternal Grandmother        breast  . Diabetes Brother   . Hypertension Brother   . COPD Brother   . Cancer Maternal Grandfather        stomach  . Heart disease Paternal Grandmother   . Stroke Paternal Grandfather      Social History   Tobacco Use  . Smoking status: Former Smoker    Packs/day: 0.50    Years: 15.00    Pack years: 7.50    Types: Cigarettes    Quit date: 04/25/2011    Years since quitting: 8.5  . Smokeless tobacco: Never Used  Substance Use Topics  . Alcohol use: Yes    Alcohol/week: 7.0 standard drinks    Types: 7 Shots of liquor per week    Comment: occasional drinks    With staff assistance, above reviewed with the patient today.  ROS: As per HPI, otherwise no specific complaints on a limited and focused system review   No results found for this or any previous visit (from the past 72 hour(s)).   PHQ2/9: Depression screen [facility] 2/9 10/21/2018 09/11/2018 04/29/2018 04/17/2018 10/10/2017   Decreased Interest 3 0 0 0 0  Down, Depressed, Hopeless  1 0 0 0 0  PHQ - 2 Score 4 0 0 0 0  Altered sleeping 2 0 0 - -  Tired, decreased energy 1 0 0 - -  Change in appetite 1 0 0 - -  Feeling bad or failure about yourself  1 0 0 - -  Trouble concentrating 0 0 0 - -  Moving slowly or fidgety/restless 0 0 0 - -  Suicidal thoughts 0 0 0 - -  PHQ-9 Score 9 0 0 - -  Difficult doing work/chores Somewhat difficult Not difficult at all Not difficult at all - -   PHQ-2/9 Result reviewed from previous, patient denied any sx's of feeling down, depressed when reviewed today    Fall Risk: Fall Risk  10/28/2019 10/21/2018 09/11/2018 04/29/2018 04/17/2018  Falls in the past year? 0 0 0 0 0  Number falls in past yr: 0 0 0 - 0  Injury with Fall? 0 0 0 - 0  Follow up Falls evaluation completed - - - Falls prevention discussed      Objective:   Vitals:   10/28/19 1148 10/28/19 1203  BP: (!) 168/78 (!) 152/84  Pulse: 96   Resp: 16   Temp: 99.1 F (37.3 C)   TempSrc: Oral   SpO2: 98%   Weight: 157 lb 11.2 oz (71.5 kg)   Height: 5\' 6"  (1.676 m)     Body mass index is 25.45 kg/m. Recheck of blood pressure by myself was 165/85 on left  Physical Exam   NAD, masked HEENT - [place]/AT, sclera anicteric, positive glasses, PERRL, EOMI, conj - non-inj'ed pharynx clear Neck - supple, no adenopathy, no TM, carotids 2+ and = without bruits bilat Car - RRR without m/g/r Pulm- RR and effort normal at rest, CTA without wheeze or rales Abd - soft, NT diffusely, nondistended Back - no CVA tenderness Ext - no LE edema,  Neuro/psychiatric - affect was not flat, appropriate with conversation  Alert   Grossly non-focal - good strength on testing extremities, sensation intact to LT in distal extremities  Speech normal   Results for orders placed or performed during the hospital encounter of 06/24/19  Glucose, capillary  Result Value Ref Range   Glucose-Capillary 123 (H) 70 - 99 mg/dL   Previous labs  reviewed    Assessment & Plan:    1. Essential hypertension, benign Blood pressure slightly high in the office today, with goal of systolic being less than 130 noted.  She notes it tends to run higher in doctor's offices, although she does not check them at home to know what they are running outside of doctors offices.  If she can get some outside readings, would be helpful. Noted the amlodipine was taken twice daily rather than once daily to help with her morning blood pressures potentially being higher in the past.  Okay to continue that way presently. Discussed the possibility of slight increases in her medicines over time, and with some hesitancy today on her before increasing today, although may need over time noted. Plan to recheck labs today, although she stated not an option as she is not prepared.  - COMPLETE METABOLIC PANEL WITH GFR - amLODipine (NORVASC) 5 MG tablet; Take 1 tablet (5 mg total) by mouth 2 (two) times daily. For blood pressure  Dispense: 180 tablet; Refill: 1  2. Mixed hyperlipidemia Her lipid panel was reviewed, and the numbers were higher again as she has not been taking her statin product. Did think it would  be a helpful medicine to return to taking, and refill that for her today. Did note her transaminases were slightly elevated, and do need to recheck those as well, and ordered to do so - COMPLETE METABOLIC PANEL WITH GFR - TSH  3. Centrilobular emphysema (HCC) 4. Pulmonary nodule Continues to follow with pulmonary, refilled his Symbicort today.  5. GAD (generalized anxiety disorder)/Insomnia She notes she has been very good in the recent past, and did not need a refill of her Lexapro product, as she stopped taking that. Does request a refill of the trazodone, as she notes that it is very helpful for her sleep, and felt was reasonable. - TSH  6. Severe needle phobia As noted above, could not have labs done today as she was not prepared. Asked that she  return to get the labs checked in the next week or 2 at the latest, and the importance of that emphasized.  She requested a refill of her Xanax in order to have before she returns for the labs, and a refill was provided. She does not take and then drive, and she notes her husband drives her.  7. Hypokalemia Noted the low potassium on last check, and she was given a supplement to take although that ran out a while ago. Noted do need to recheck that lab, and hope to do so soon as noted above - COMPLETE METABOLIC PANEL WITH GFR  8. Elevation of levels of liver transaminase levels Discussed this with her today, and do need to recheck those levels presently, especially with her being on a statin again. Also lessening alcohol use will be helpful. - COMPLETE METABOLIC PANEL WITH GFR  9. Prediabetes Last A1c was reasonable, still just in the prediabetes range. - TSH  10. Overweight Has been successful with some mild weight loss and doing well from a healthy weight maintenance over time. Continuing to stay active to help encouraged  11. Vitamin D deficiency She did not take the vitamin D supplement prescribed previously, with her level low on last check. Did feel best rechecking the lab again to reassess. Noted likely will remain in a lower level, and pending that result, will help in the dose of vitamin D supplement recommended. - VITAMIN D 25 Hydroxy (Vit-D Deficiency, Fractures) - TSH  12. Polycythemia Her hemoglobin was slightly elevated, and we will recheck that lab as well. May be related to her respiratory status noted. - CBC with Differential/Platelet - Ferritin  13. Encounter for screening mammogram for malignant neoplasm of breast She is due for mammogram, and 1 was ordered. - MM 3D SCREEN BREAST BILATERAL; Future  14..  Aortic atherosclerosis Continuing the statin recommended  Noted to her today the importance of follow-ups over time, and tentatively schedule I for 6 months  time.  Follow-up sooner as needed. She noted she will return in 1 to 2 weeks to obtain these labs, and the importance of that emphasized.  will follow up with recommendations after these lab results are obtained.      Towanda Malkin, MD 10/28/19 12:35 PM

## 2019-10-28 ENCOUNTER — Encounter: Payer: Self-pay | Admitting: Internal Medicine

## 2019-10-28 ENCOUNTER — Other Ambulatory Visit: Payer: Self-pay

## 2019-10-28 ENCOUNTER — Ambulatory Visit (INDEPENDENT_AMBULATORY_CARE_PROVIDER_SITE_OTHER): Payer: Medicare HMO | Admitting: Internal Medicine

## 2019-10-28 VITALS — BP 152/84 | HR 96 | Temp 99.1°F | Resp 16 | Ht 66.0 in | Wt 157.7 lb

## 2019-10-28 DIAGNOSIS — F411 Generalized anxiety disorder: Secondary | ICD-10-CM | POA: Diagnosis not present

## 2019-10-28 DIAGNOSIS — E559 Vitamin D deficiency, unspecified: Secondary | ICD-10-CM | POA: Insufficient documentation

## 2019-10-28 DIAGNOSIS — E876 Hypokalemia: Secondary | ICD-10-CM

## 2019-10-28 DIAGNOSIS — D751 Secondary polycythemia: Secondary | ICD-10-CM

## 2019-10-28 DIAGNOSIS — I10 Essential (primary) hypertension: Secondary | ICD-10-CM | POA: Diagnosis not present

## 2019-10-28 DIAGNOSIS — J432 Centrilobular emphysema: Secondary | ICD-10-CM | POA: Diagnosis not present

## 2019-10-28 DIAGNOSIS — E782 Mixed hyperlipidemia: Secondary | ICD-10-CM

## 2019-10-28 DIAGNOSIS — R7401 Elevation of levels of liver transaminase levels: Secondary | ICD-10-CM

## 2019-10-28 DIAGNOSIS — R7303 Prediabetes: Secondary | ICD-10-CM | POA: Diagnosis not present

## 2019-10-28 DIAGNOSIS — R911 Solitary pulmonary nodule: Secondary | ICD-10-CM

## 2019-10-28 DIAGNOSIS — I700 Atherosclerosis of aorta: Secondary | ICD-10-CM | POA: Diagnosis not present

## 2019-10-28 DIAGNOSIS — Z1231 Encounter for screening mammogram for malignant neoplasm of breast: Secondary | ICD-10-CM

## 2019-10-28 DIAGNOSIS — F40298 Other specified phobia: Secondary | ICD-10-CM

## 2019-10-28 DIAGNOSIS — E663 Overweight: Secondary | ICD-10-CM

## 2019-10-28 MED ORDER — TRAZODONE HCL 100 MG PO TABS: 100.0000 mg | ORAL_TABLET | Freq: Every evening | ORAL | 1 refills | Status: DC | PRN

## 2019-10-28 MED ORDER — SYMBICORT 160-4.5 MCG/ACT IN AERO: 2.0000 | INHALATION_SPRAY | Freq: Two times a day (BID) | RESPIRATORY_TRACT | 3 refills | Status: DC

## 2019-10-28 MED ORDER — AMLODIPINE BESYLATE 5 MG PO TABS: 5.0000 mg | ORAL_TABLET | Freq: Two times a day (BID) | ORAL | 1 refills | Status: DC

## 2019-10-28 MED ORDER — ALPRAZOLAM 0.5 MG PO TABS: ORAL_TABLET | ORAL | 0 refills | Status: DC

## 2019-10-28 MED ORDER — ATORVASTATIN CALCIUM 10 MG PO TABS: 10.0000 mg | ORAL_TABLET | Freq: Every day | ORAL | 1 refills | Status: DC

## 2019-10-28 NOTE — Patient Instructions (Signed)

## 2019-11-10 DIAGNOSIS — H04203 Unspecified epiphora, bilateral lacrimal glands: Secondary | ICD-10-CM | POA: Diagnosis not present

## 2019-11-20 DIAGNOSIS — J449 Chronic obstructive pulmonary disease, unspecified: Secondary | ICD-10-CM | POA: Diagnosis not present

## 2019-11-24 DIAGNOSIS — H11823 Conjunctivochalasis, bilateral: Secondary | ICD-10-CM | POA: Diagnosis not present

## 2019-12-01 ENCOUNTER — Ambulatory Visit: Payer: Medicare HMO

## 2019-12-03 DIAGNOSIS — H11823 Conjunctivochalasis, bilateral: Secondary | ICD-10-CM | POA: Diagnosis not present

## 2019-12-18 ENCOUNTER — Other Ambulatory Visit: Payer: Medicare HMO

## 2019-12-21 ENCOUNTER — Telehealth: Payer: Medicare HMO | Admitting: Oncology

## 2019-12-21 DIAGNOSIS — J449 Chronic obstructive pulmonary disease, unspecified: Secondary | ICD-10-CM | POA: Diagnosis not present

## 2019-12-22 ENCOUNTER — Telehealth: Payer: Medicare HMO | Admitting: Oncology

## 2019-12-23 ENCOUNTER — Encounter (INDEPENDENT_AMBULATORY_CARE_PROVIDER_SITE_OTHER): Payer: Self-pay

## 2019-12-23 ENCOUNTER — Other Ambulatory Visit: Payer: Self-pay

## 2019-12-23 ENCOUNTER — Ambulatory Visit
Admission: RE | Admit: 2019-12-23 | Discharge: 2019-12-23 | Disposition: A | Discharge status: Home / Self Care | Payer: Medicare HMO | Source: Ambulatory Visit | Attending: Internal Medicine | Admitting: Internal Medicine

## 2019-12-23 DIAGNOSIS — Z1231 Encounter for screening mammogram for malignant neoplasm of breast: Secondary | ICD-10-CM | POA: Insufficient documentation

## 2019-12-29 DIAGNOSIS — H02103 Unspecified ectropion of right eye, unspecified eyelid: Secondary | ICD-10-CM | POA: Insufficient documentation

## 2019-12-29 DIAGNOSIS — H02132 Senile ectropion of right lower eyelid: Secondary | ICD-10-CM | POA: Insufficient documentation

## 2020-01-03 IMAGING — CR CHEST - 2 VIEW
1 series · 2 of 2 positions shown · non-contrast
Comparison: 01/22/2015

CLINICAL DATA: Shortness of breath. COPD. Hypercarbia.

EXAM:
CHEST - 2 VIEW

[Series 1: dg chest 2 view · 0.14mm/px · 2 of 2 slices shown]
[im 1/2]
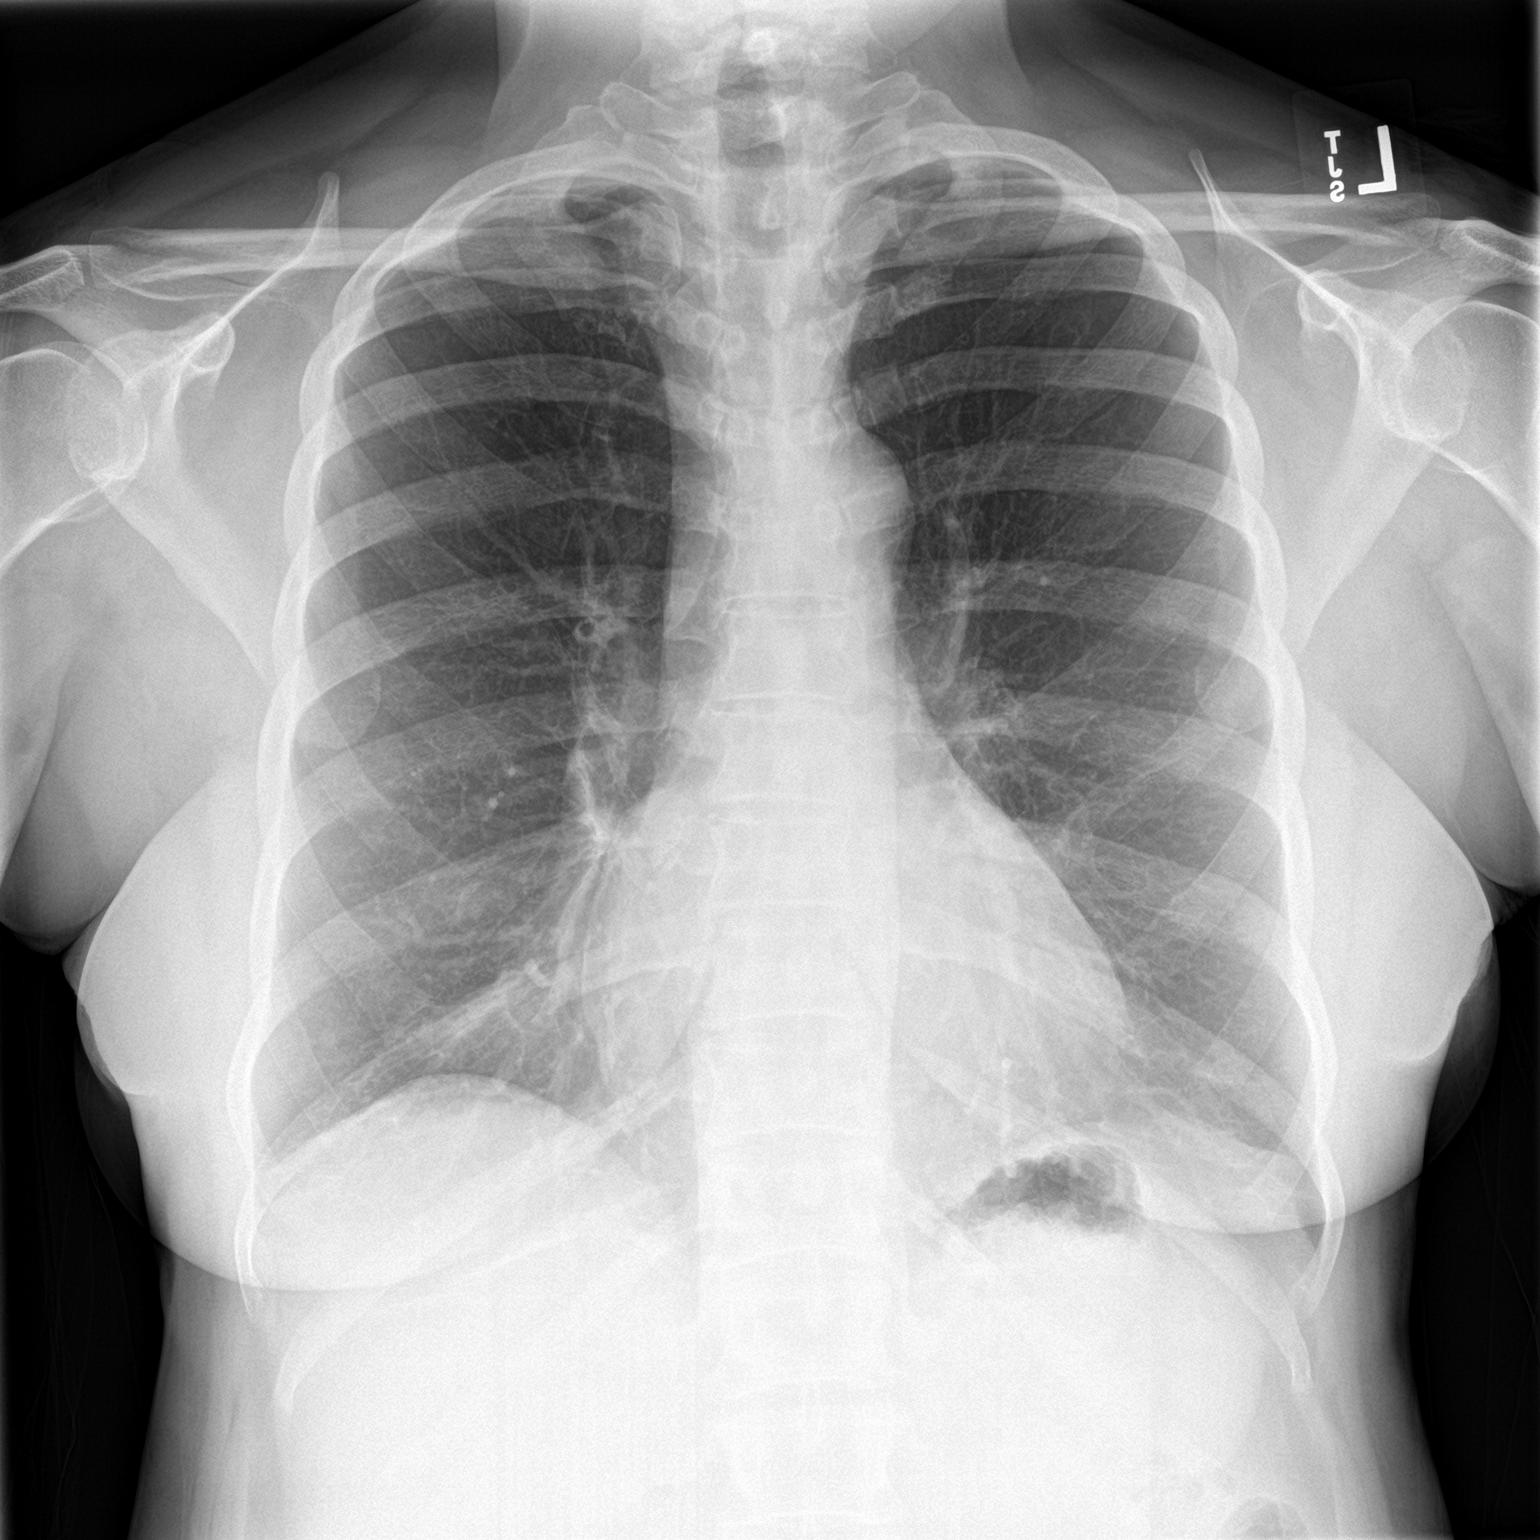
[im 2/2]
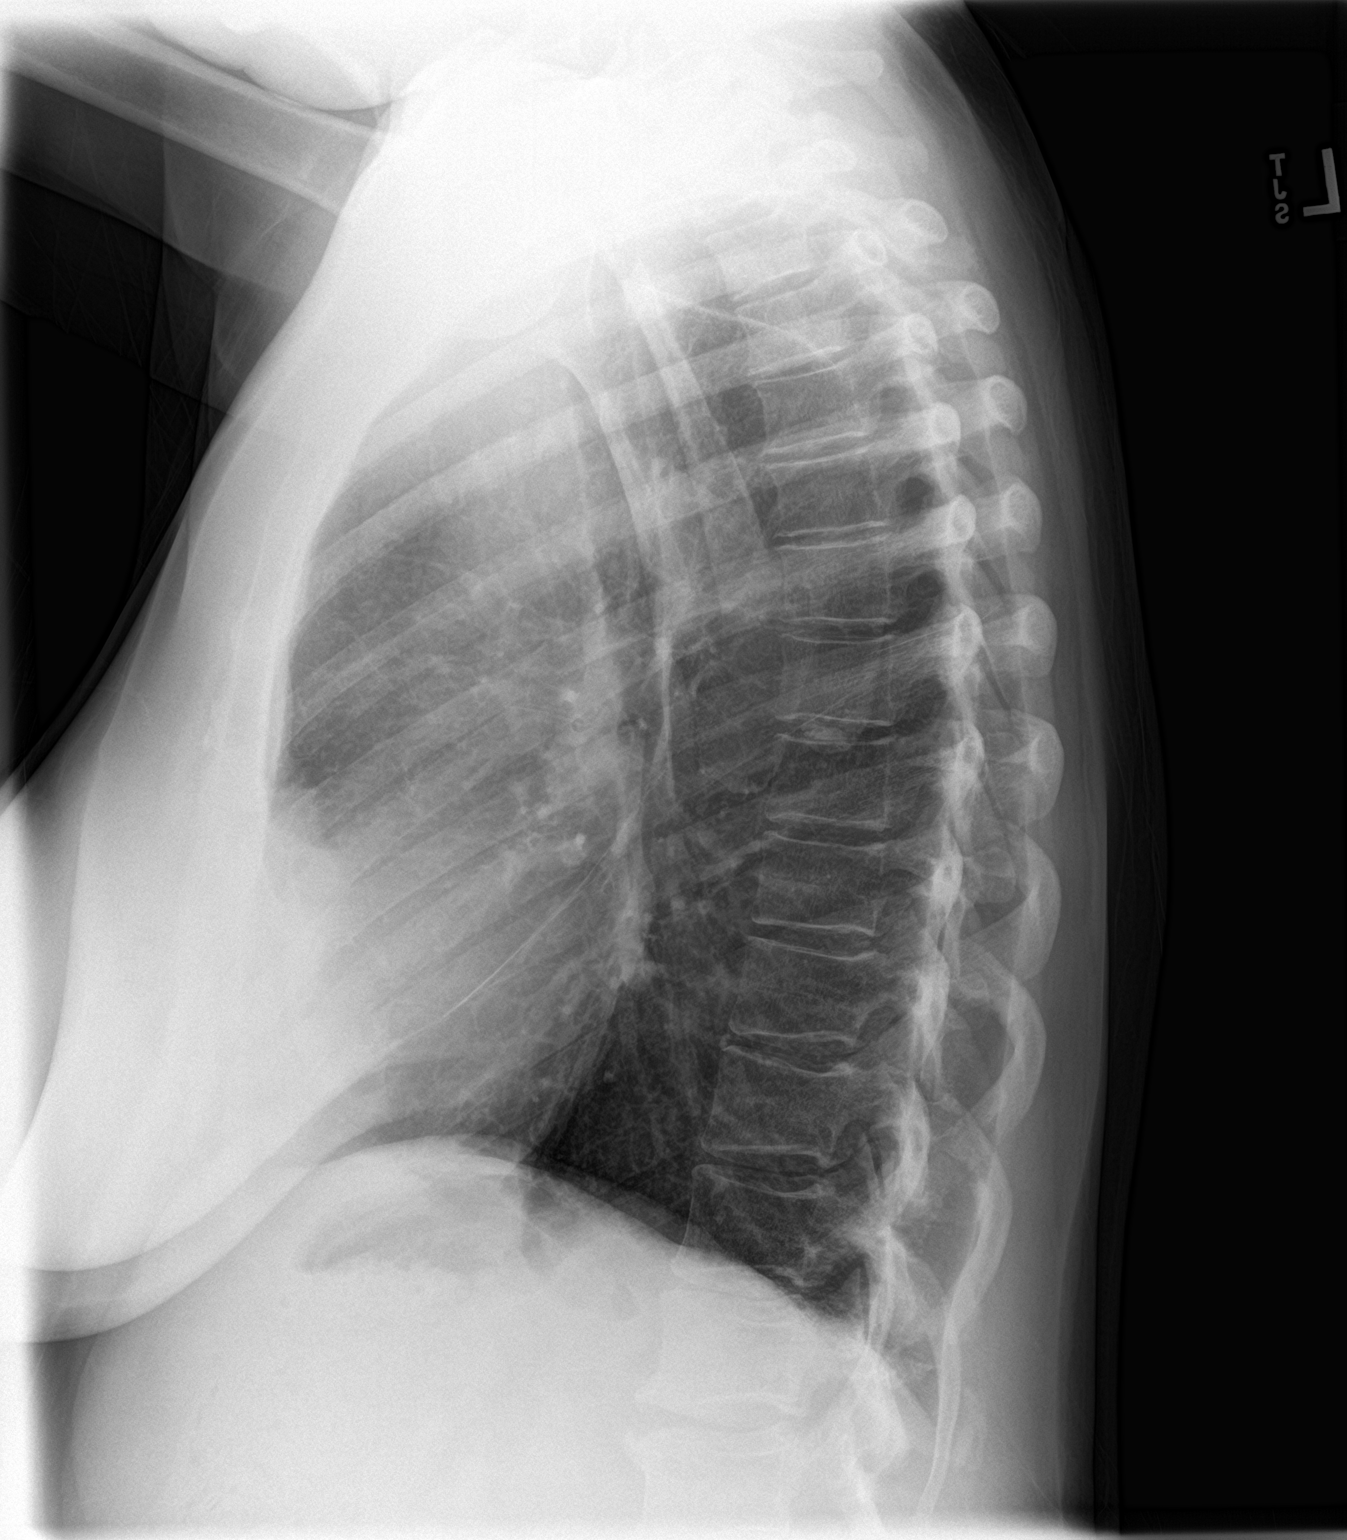

[2 of 2 positions shown; findings below may reference images not displayed]

FINDINGS: Cardiomediastinal silhouette is normal. Mediastinal contours appear
intact.

There is no evidence of focal airspace consolidation, pleural
effusion or pneumothorax. Mild peribronchial thickening bilaterally.
Architectural distortion in the right infrahilar region.

Osseous structures are without acute abnormality. Soft tissues are
grossly normal.
IMPRESSION: 1. Mild peribronchial thickening bilaterally usually associated with
bronchitis or reactive airway disease.
2. Architectural distortion in the right infrahilar region, with
which pulmonary nodule can not be excluded. If further imaging
evaluation is desired, low-dose noncontrasted chest CT may be
considered.

## 2020-01-07 ENCOUNTER — Ambulatory Visit: Payer: Medicare HMO

## 2020-01-20 ENCOUNTER — Telehealth: Payer: Self-pay | Admitting: *Deleted

## 2020-01-20 DIAGNOSIS — Z20822 Contact with and (suspected) exposure to covid-19: Secondary | ICD-10-CM | POA: Diagnosis not present

## 2020-01-20 DIAGNOSIS — J449 Chronic obstructive pulmonary disease, unspecified: Secondary | ICD-10-CM | POA: Diagnosis not present

## 2020-01-20 DIAGNOSIS — Z01812 Encounter for preprocedural laboratory examination: Secondary | ICD-10-CM | POA: Diagnosis not present

## 2020-01-20 NOTE — Telephone Encounter (Signed)
Follow up call made to patient to reschedule pt for appts in the lung nodule clinic. Pt stated that she would like a call back next week since she is scheduled for eye surgery this Friday 12/3. Will call back next week per pt request to reschedule appts.

## 2020-01-22 DIAGNOSIS — H02132 Senile ectropion of right lower eyelid: Secondary | ICD-10-CM | POA: Diagnosis not present

## 2020-01-22 DIAGNOSIS — H11823 Conjunctivochalasis, bilateral: Secondary | ICD-10-CM | POA: Diagnosis not present

## 2020-01-22 DIAGNOSIS — H04223 Epiphora due to insufficient drainage, bilateral lacrimal glands: Secondary | ICD-10-CM | POA: Diagnosis not present

## 2020-01-22 DIAGNOSIS — H02106 Unspecified ectropion of left eye, unspecified eyelid: Secondary | ICD-10-CM | POA: Diagnosis not present

## 2020-01-22 DIAGNOSIS — I10 Essential (primary) hypertension: Secondary | ICD-10-CM | POA: Diagnosis not present

## 2020-01-22 DIAGNOSIS — H02135 Senile ectropion of left lower eyelid: Secondary | ICD-10-CM | POA: Diagnosis not present

## 2020-01-22 DIAGNOSIS — J449 Chronic obstructive pulmonary disease, unspecified: Secondary | ICD-10-CM | POA: Diagnosis not present

## 2020-01-22 DIAGNOSIS — H02103 Unspecified ectropion of right eye, unspecified eyelid: Secondary | ICD-10-CM | POA: Diagnosis not present

## 2020-02-20 DIAGNOSIS — J449 Chronic obstructive pulmonary disease, unspecified: Secondary | ICD-10-CM | POA: Diagnosis not present

## 2020-03-02 ENCOUNTER — Other Ambulatory Visit: Payer: Self-pay | Admitting: Specialist

## 2020-03-02 DIAGNOSIS — J439 Emphysema, unspecified: Secondary | ICD-10-CM | POA: Diagnosis not present

## 2020-03-02 DIAGNOSIS — R0600 Dyspnea, unspecified: Secondary | ICD-10-CM | POA: Diagnosis not present

## 2020-03-02 DIAGNOSIS — R918 Other nonspecific abnormal finding of lung field: Secondary | ICD-10-CM

## 2020-03-02 DIAGNOSIS — Z9981 Dependence on supplemental oxygen: Secondary | ICD-10-CM | POA: Diagnosis not present

## 2020-03-10 ENCOUNTER — Ambulatory Visit: Payer: Medicare HMO

## 2020-03-17 ENCOUNTER — Ambulatory Visit: Payer: Medicare HMO

## 2020-03-22 DIAGNOSIS — J449 Chronic obstructive pulmonary disease, unspecified: Secondary | ICD-10-CM | POA: Diagnosis not present

## 2020-03-25 ENCOUNTER — Ambulatory Visit
Admission: RE | Admit: 2020-03-25 | Discharge: 2020-03-25 | Disposition: A | Discharge status: Home / Self Care | Payer: Medicare HMO | Source: Ambulatory Visit | Attending: Specialist | Admitting: Specialist

## 2020-03-25 ENCOUNTER — Other Ambulatory Visit: Payer: Self-pay

## 2020-03-25 DIAGNOSIS — J984 Other disorders of lung: Secondary | ICD-10-CM | POA: Diagnosis not present

## 2020-03-25 DIAGNOSIS — R918 Other nonspecific abnormal finding of lung field: Secondary | ICD-10-CM | POA: Diagnosis not present

## 2020-03-25 DIAGNOSIS — J432 Centrilobular emphysema: Secondary | ICD-10-CM | POA: Diagnosis not present

## 2020-03-25 DIAGNOSIS — I700 Atherosclerosis of aorta: Secondary | ICD-10-CM | POA: Diagnosis not present

## 2020-04-19 DIAGNOSIS — J449 Chronic obstructive pulmonary disease, unspecified: Secondary | ICD-10-CM | POA: Diagnosis not present

## 2020-04-26 ENCOUNTER — Other Ambulatory Visit: Payer: Self-pay

## 2020-04-26 ENCOUNTER — Encounter: Payer: Self-pay | Admitting: Family Medicine

## 2020-04-26 ENCOUNTER — Ambulatory Visit: Payer: Medicare HMO | Admitting: Internal Medicine

## 2020-04-26 ENCOUNTER — Ambulatory Visit (INDEPENDENT_AMBULATORY_CARE_PROVIDER_SITE_OTHER): Payer: Medicare HMO | Admitting: Family Medicine

## 2020-04-26 VITALS — BP 168/100 | HR 98 | Temp 98.7°F | Resp 16 | Ht 66.0 in | Wt 158.1 lb

## 2020-04-26 DIAGNOSIS — R7303 Prediabetes: Secondary | ICD-10-CM | POA: Diagnosis not present

## 2020-04-26 DIAGNOSIS — I10 Essential (primary) hypertension: Secondary | ICD-10-CM | POA: Diagnosis not present

## 2020-04-26 DIAGNOSIS — I700 Atherosclerosis of aorta: Secondary | ICD-10-CM

## 2020-04-26 DIAGNOSIS — E663 Overweight: Secondary | ICD-10-CM

## 2020-04-26 DIAGNOSIS — J432 Centrilobular emphysema: Secondary | ICD-10-CM | POA: Diagnosis not present

## 2020-04-26 DIAGNOSIS — E782 Mixed hyperlipidemia: Secondary | ICD-10-CM | POA: Diagnosis not present

## 2020-04-26 MED ORDER — AMLODIPINE BESYLATE 5 MG PO TABS: 5.0000 mg | ORAL_TABLET | Freq: Two times a day (BID) | ORAL | 1 refills | Status: DC

## 2020-04-26 MED ORDER — METOPROLOL SUCCINATE ER 25 MG PO TB24
12.5000 mg | ORAL_TABLET | Freq: Every day | ORAL | 1 refills | Status: DC
Start: 2020-04-26 — End: 2022-11-26

## 2020-04-26 MED ORDER — ALBUTEROL SULFATE HFA 108 (90 BASE) MCG/ACT IN AERS: 2.0000 | INHALATION_SPRAY | Freq: Four times a day (QID) | RESPIRATORY_TRACT | 3 refills | Status: AC | PRN

## 2020-04-26 MED ORDER — TRELEGY ELLIPTA 100-62.5-25 MCG/INH IN AEPB: 1.0000 | INHALATION_SPRAY | Freq: Every day | RESPIRATORY_TRACT | 11 refills | Status: AC

## 2020-04-26 MED ORDER — BLOOD PRESSURE CUFF MISC: 1.0000 | Freq: Every day | 0 refills | Status: AC

## 2020-04-26 MED ORDER — ATORVASTATIN CALCIUM 10 MG PO TABS: 10.0000 mg | ORAL_TABLET | Freq: Every day | ORAL | 1 refills | Status: DC

## 2020-04-26 NOTE — Progress Notes (Signed)
    SUBJECTIVE:   CHIEF COMPLAINT / HPI:   Hypertension: - Medications: amlodipine 5mg  BID, metoprolol 12.5mg  daily - Compliance: good - Checking BP at home: no - Denies any LE edema, medication SEs - occasionally will get lightheaded with changing positions.  HLD - medications: lipitor 10mg  - compliance: hasn't been taking, didn't know what it was for. - medication SEs: n/a  Emphysema/COPD - Medications: albuterol prn, trelegy 1 puff daily - followed by Pulm and Onc, last visit 1/12 and switched from symbicort to trelegy. Has upcoming appointment next month. - Compliance: taking albuterol daily, scheduled. Never switched to trelegy, still taking symbicort. - reports breathing is "up and down"  - CAT score: 15 - Former smoker - 0.9 irregular solid RUL nodule increased in size from 08/2018, f/u CT chest 2/4 with stability though with 2.4cm lesion in RLL, likely infectious/inflammatory, recommend f/u CT chest in 3 months after therapy.    OBJECTIVE:   BP (!) 168/100   Pulse 98   Temp 98.7 F (37.1 C) (Oral)   Resp 16   Ht 5\' 6"  (1.676 m)   Wt 158 lb 1.6 oz (71.7 kg)   LMP 10/27/2015 Comment: had hysterctomy  SpO2 93%   BMI 25.52 kg/m   Gen: well appearing, in NAD Card: RRR Lungs: CTAB Ext: WWP, no edema  ASSESSMENT/PLAN:   Essential hypertension, benign Elevated but did not take medication today as she hasn't eaten. Otherwise reports compliance and with pill box. Refills provided. Obtaining labs today. F/u in 3 months.  COPD (chronic obstructive pulmonary disease) (HCC) Chronic. Refill and coupon provided for trelegy given she never got from pharmacy. Counseling provided on appropriate use of albuterol and controller vs rescue inhaler, will likely need continued and ongoing counseling. Continue to follow with Pulm and Onc.  Hyperlipidemia Not compliant with statin, counseling provided. Rechecking labs today, refill sent.   F/u in 3 months.   Myles Gip, DO

## 2020-04-26 NOTE — Assessment & Plan Note (Signed)
Not compliant with statin, counseling provided. Rechecking labs today, refill sent.

## 2020-04-26 NOTE — Assessment & Plan Note (Signed)
Chronic. Refill and coupon provided for trelegy given she never got from pharmacy. Counseling provided on appropriate use of albuterol and controller vs rescue inhaler, will likely need continued and ongoing counseling. Continue to follow with Pulm and Onc.

## 2020-04-26 NOTE — Assessment & Plan Note (Signed)
Elevated but did not take medication today as she hasn't eaten. Otherwise reports compliance and with pill box. Refills provided. Obtaining labs today. F/u in 3 months.

## 2020-04-26 NOTE — Patient Instructions (Addendum)
It was great to see you!  Our plans for today:  - Stop taking the Symbicort and take the new inhaler, Trelegy, 1 puff every day. This was sent to [place]. - Make sure to take your blood pressure medication every day and use your pill box.  - Take your atorvastatin every day. This is for your cholesterol.  - Come back in 3 months.  We are checking some labs today, we will release these results to your MyChart.  Take care and seek immediate care sooner if you develop any concerns.   Dr. Ky Barban

## 2020-04-27 LAB — HEMOGLOBIN A1C
Hgb A1c MFr Bld: 5.7 % of total Hgb — ABNORMAL HIGH (ref <5.7)
Mean Plasma Glucose: 117 mg/dL
eAG (mmol/L): 6.5 mmol/L

## 2020-04-27 LAB — BASIC METABOLIC PANEL
BUN: 11 mg/dL (ref 7–25)
CO2: 33 mmol/L — ABNORMAL HIGH (ref 20–32)
Calcium: 10.4 mg/dL (ref 8.6–10.4)
Chloride: 93 mmol/L — ABNORMAL LOW (ref 98–110)
Creat: 0.79 mg/dL (ref 0.50–0.99)
Glucose, Bld: 104 mg/dL — ABNORMAL HIGH (ref 65–99)
Potassium: 3.9 mmol/L (ref 3.5–5.3)
Sodium: 142 mmol/L (ref 135–146)

## 2020-04-27 LAB — LIPID PANEL
Cholesterol: 244 mg/dL — ABNORMAL HIGH (ref <200)
HDL: 87 mg/dL (ref >=50)
LDL Cholesterol (Calc): 136 mg/dL (calc) — ABNORMAL HIGH
Non-HDL Cholesterol (Calc): 157 mg/dL (calc) — ABNORMAL HIGH (ref <130)
Total CHOL/HDL Ratio: 2.8 (calc) (ref <5.0)
Triglycerides: 104 mg/dL (ref <150)

## 2020-05-12 ENCOUNTER — Other Ambulatory Visit
Admission: RE | Admit: 2020-05-12 | Discharge: 2020-05-12 | Disposition: A | Discharge status: Home / Self Care | Payer: Medicare HMO | Source: Ambulatory Visit | Attending: Oculoplastics Ophthalmology | Admitting: Oculoplastics Ophthalmology

## 2020-05-12 DIAGNOSIS — H04203 Unspecified epiphora, bilateral lacrimal glands: Secondary | ICD-10-CM | POA: Insufficient documentation

## 2020-05-12 LAB — COMPREHENSIVE METABOLIC PANEL
ALT: 29 U/L (ref 0–44)
AST: 73 U/L — ABNORMAL HIGH (ref 15–41)
Albumin: 4.4 g/dL (ref 3.5–5.0)
Alkaline Phosphatase: 99 U/L (ref 38–126)
Anion gap: 12 (ref 5–15)
BUN: 14 mg/dL (ref 8–23)
CO2: 34 mmol/L — ABNORMAL HIGH (ref 22–32)
Calcium: 9.9 mg/dL (ref 8.9–10.3)
Chloride: 100 mmol/L (ref 98–111)
Creatinine, Ser: 0.61 mg/dL (ref 0.44–1.00)
GFR, Estimated: >60 mL/min (ref >60)
Glucose, Bld: 104 mg/dL — ABNORMAL HIGH (ref 70–99)
Potassium: 3.1 mmol/L — ABNORMAL LOW (ref 3.5–5.1)
Sodium: 146 mmol/L — ABNORMAL HIGH (ref 135–145)
Total Bilirubin: 0.7 mg/dL (ref 0.3–1.2)
Total Protein: 7.8 g/dL (ref 6.5–8.1)

## 2020-05-12 LAB — SEDIMENTATION RATE: Sed Rate: 14 mm/hr (ref 0–30)

## 2020-05-13 LAB — THYROID STIMULATING IMMUNOGLOBULIN: Thyroid Stimulating Immunoglob: <0.1 IU/L (ref 0.00–0.55)

## 2020-05-20 DIAGNOSIS — J449 Chronic obstructive pulmonary disease, unspecified: Secondary | ICD-10-CM | POA: Diagnosis not present

## 2020-05-24 ENCOUNTER — Telehealth: Payer: Self-pay | Admitting: Internal Medicine

## 2020-05-24 NOTE — Telephone Encounter (Signed)
Copied from [place] [id number]. Topic: Medicare AWV >> May 24, 2020  2:17 PM Cher Nakai R wrote: Reason for CRM:  Left message to reschedule AWVS

## 2020-06-16 ENCOUNTER — Other Ambulatory Visit: Payer: Self-pay | Admitting: Specialist

## 2020-06-16 DIAGNOSIS — H04203 Unspecified epiphora, bilateral lacrimal glands: Secondary | ICD-10-CM | POA: Diagnosis not present

## 2020-06-16 DIAGNOSIS — H10403 Unspecified chronic conjunctivitis, bilateral: Secondary | ICD-10-CM | POA: Diagnosis not present

## 2020-06-16 DIAGNOSIS — R918 Other nonspecific abnormal finding of lung field: Secondary | ICD-10-CM

## 2020-06-19 DIAGNOSIS — J449 Chronic obstructive pulmonary disease, unspecified: Secondary | ICD-10-CM | POA: Diagnosis not present

## 2020-06-29 ENCOUNTER — Telehealth: Payer: Self-pay | Admitting: Internal Medicine

## 2020-06-29 NOTE — Telephone Encounter (Signed)
Copied from [place] [id number]. Topic: Medicare AWV >> Jun 29, 2020  2:48 PM Cher Nakai R wrote: Reason for CRM:  Left message for patient to call back and schedule Medicare Annual Wellness Visit (AWV) in office.   If unable to come into the office for AWV,  please offer to do virtually or by telephone.  Last AWV:  04/17/2018  Please schedule at anytime with [place].  40 minute appointment  Any questions, please contact me at [phone]

## 2020-07-15 ENCOUNTER — Encounter: Payer: Self-pay | Admitting: Emergency Medicine

## 2020-07-15 ENCOUNTER — Emergency Department: Payer: Medicare HMO

## 2020-07-15 ENCOUNTER — Emergency Department
Admission: EM | Admit: 2020-07-15 | Discharge: 2020-07-15 | Disposition: A | Discharge status: Home / Self Care | Payer: Medicare HMO | Attending: Emergency Medicine | Admitting: Emergency Medicine

## 2020-07-15 ENCOUNTER — Other Ambulatory Visit: Payer: Self-pay

## 2020-07-15 DIAGNOSIS — H04203 Unspecified epiphora, bilateral lacrimal glands: Secondary | ICD-10-CM | POA: Diagnosis not present

## 2020-07-15 DIAGNOSIS — W1840XA Slipping, tripping and stumbling without falling, unspecified, initial encounter: Secondary | ICD-10-CM | POA: Insufficient documentation

## 2020-07-15 DIAGNOSIS — S93601A Unspecified sprain of right foot, initial encounter: Secondary | ICD-10-CM | POA: Diagnosis not present

## 2020-07-15 DIAGNOSIS — I10 Essential (primary) hypertension: Secondary | ICD-10-CM | POA: Insufficient documentation

## 2020-07-15 DIAGNOSIS — Z0100 Encounter for examination of eyes and vision without abnormal findings: Secondary | ICD-10-CM | POA: Diagnosis not present

## 2020-07-15 DIAGNOSIS — J449 Chronic obstructive pulmonary disease, unspecified: Secondary | ICD-10-CM | POA: Diagnosis not present

## 2020-07-15 DIAGNOSIS — Z7982 Long term (current) use of aspirin: Secondary | ICD-10-CM | POA: Diagnosis not present

## 2020-07-15 DIAGNOSIS — M79673 Pain in unspecified foot: Secondary | ICD-10-CM

## 2020-07-15 DIAGNOSIS — Z87891 Personal history of nicotine dependence: Secondary | ICD-10-CM | POA: Diagnosis not present

## 2020-07-15 DIAGNOSIS — Z79899 Other long term (current) drug therapy: Secondary | ICD-10-CM | POA: Diagnosis not present

## 2020-07-15 DIAGNOSIS — M79671 Pain in right foot: Secondary | ICD-10-CM | POA: Diagnosis not present

## 2020-07-15 DIAGNOSIS — Y92000 Kitchen of unspecified non-institutional (private) residence as  the place of occurrence of the external cause: Secondary | ICD-10-CM | POA: Diagnosis not present

## 2020-07-15 DIAGNOSIS — S99921A Unspecified injury of right foot, initial encounter: Secondary | ICD-10-CM | POA: Diagnosis not present

## 2020-07-15 MED ORDER — MELOXICAM 15 MG PO TABS: 15.0000 mg | ORAL_TABLET | Freq: Every day | ORAL | 0 refills | Status: DC

## 2020-07-15 MED ORDER — HYDROCODONE-ACETAMINOPHEN 5-325 MG PO TABS: 1.0000 | ORAL_TABLET | ORAL | 0 refills | Status: AC | PRN

## 2020-07-15 NOTE — ED Provider Notes (Signed)
[facility] Emergency Department Provider Note  ____________________________________________  Time seen: Approximately 4:59 PM  I have reviewed the triage vital signs and the nursing notes.   HISTORY  Chief Complaint Foot Pain    HPI Andrea Bell is a 65 y.o. female who presents the emergency department complaining of right foot pain.  Patient states that she was mopping her kitchen floor 4 days ago when she slipped on the wet flooring.  She states that she did not hit her foot on anything, did not come to the hard stop, she simply stretched her foot while trying to keep her balance.  Patient has had bruising and pain to the foot since this occurrence.  She is still able to bear weight but doing so increases the pain.  She states that if she keeps her foot down at all she will have some edema of the midfoot.  Ecchymosis is mostly in the plantar aspect.  No loss of sensation.  No other injury or complaint.  She denies any ankle pain, knee, hip or lower back pain/injury.  Medical history as described below with no come plaints with chronic medical issues.        Past Medical History:  Diagnosis Date  . Allergy   . Anemia   . Chronic back pain   . Complication of anesthesia    slow to wake  . COPD (chronic obstructive pulmonary disease) (HCC)    2l home oxygen  . Dyspnea   . GERD (gastroesophageal reflux disease)   . Headache   . HTN (hypertension)   . Hyperlipidemia   . Insomnia   . Insomnia   . Osteoarthritis   . Osteoporosis   . Oxygen deficiency   . Tachycardia   . Vertigo    none recently  . Wears dentures    partial lower    Patient Active Problem List   Diagnosis Date Noted  . 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 Elevation of levels of liver transaminase levels 10/28/2019  . Prediabetes 10/28/2019  . Vitamin D deficiency 10/28/2019  . Polycythemia 10/28/2019  . Pulmonary  nodule 10/28/2019  . GAD (generalized anxiety disorder) 10/28/2019  . Aortic atherosclerosis ([place]) 10/21/2018  . Estrogen deficiency 04/18/2018  . History of tobacco use 04/18/2018  . Hypercalcemia 04/18/2018  . Epiphora due to insufficient drainage of both sides 04/17/2018  . Allergic rhinitis 05/09/2017  . Overweight (BMI 25.0-29.9) 05/09/2017  . 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 Abnormal liver function tests 10/16/2016  . Abnormal EKG 11/24/2015  . Hyperlipidemia 10/11/2015  . Steroid-induced hyperglycemia 04/15/2015  . Arthritis 04/15/2015  . Insomnia 04/15/2015  . Chronic bilateral low back pain 04/06/2015  . Medication monitoring encounter 04/06/2015  . Essential hypertension, benign 04/06/2015  . Supplemental oxygen dependent 04/06/2015  . COPD (chronic obstructive pulmonary disease) ([place]) 01/16/2015    Past Surgical History:  Procedure Laterality Date  . ABDOMINAL HYSTERECTOMY    . CYST REMOVAL TRUNK    . CYSTECTOMY     back and side of head  . CYSTOSCOPY N/A 12/13/2015   Procedure: CYSTOSCOPY;  Surgeon: Malachy Mood, MD;  Location: [facility] ORS;  Service: Gynecology;  Laterality: N/A;  . LAPAROSCOPIC BILATERAL SALPINGO OOPHERECTOMY Bilateral 12/13/2015   Procedure: LAPAROSCOPIC BILATERAL SALPINGO OOPHORECTOMY;  Surgeon: Malachy Mood, MD;  Location: [facility] ORS;  Service: Gynecology;  Laterality: Bilateral;  . LAPAROSCOPIC HYSTERECTOMY N/A 12/13/2015   Procedure: HYSTERECTOMY TOTAL LAPAROSCOPIC;  Surgeon:  Malachy Mood, MD;  Location: [facility] ORS;  Service: Gynecology;  Laterality: N/A;  . tear duct surgery     bilateral  . TUBAL LIGATION      Prior to Admission medications   Medication Sig Start Date End Date Taking? Authorizing Provider  HYDROcodone-acetaminophen (NORCO/VICODIN) 5-325 MG tablet Take 1 tablet by mouth every 4 (four) hours as needed for moderate pain. 07/15/20 07/15/21 Yes Kaidan Harpster, Charline Bills, PA-C  meloxicam (MOBIC) 15 MG tablet Take 1 tablet (15  mg total) by mouth daily. 07/15/20  Yes Quinnten Calvin, Charline Bills, PA-C  albuterol (PROVENTIL) (2.5 MG/3ML) 0.083% nebulizer solution Take 3 mLs (2.5 mg total) by nebulization every 4 (four) hours as needed for wheezing or shortness of breath. 04/18/18   Lada, Satira Anis, MD  albuterol (VENTOLIN HFA) 108 (90 Base) MCG/ACT inhaler Inhale 2 puffs into the lungs every 6 (six) hours as needed for wheezing or shortness of breath. 04/26/20   Myles Gip, DO  ALPRAZolam Duanne Moron) 0.5 MG tablet One or two tablets by mouth one hour prior to procedure, dentist visit, lab draw; no alcohol; do not drive for six hours 10/22/88   Towanda Malkin, MD  amLODipine (NORVASC) 5 MG tablet Take 1 tablet (5 mg total) by mouth 2 (two) times daily. For blood pressure 04/26/20   Myles Gip, DO  aspirin EC 81 MG tablet Take 81 mg by mouth daily.    [provider]  atorvastatin (LIPITOR) 10 MG tablet Take 1 tablet (10 mg total) by mouth at bedtime. 04/26/20   Myles Gip, DO  Blood Pressure Monitoring (BLOOD PRESSURE CUFF) MISC 1 each by Does not apply route daily. 04/26/20   Myles Gip, DO  escitalopram (LEXAPRO) 5 MG tablet Take by mouth.    [provider]  Fluticasone-Umeclidin-Vilant (TRELEGY ELLIPTA) 100-62.5-25 MCG/INH AEPB Inhale 1 puff into the lungs daily. 04/26/20   Myles Gip, DO  meclizine (ANTIVERT) 25 MG tablet Take 1 tablet (25 mg total) by mouth 3 (three) times daily as needed for dizziness. 05/09/17   Arnetha Courser, MD  metoprolol succinate (TOPROL-XL) 25 MG 24 hr tablet Take 0.5 tablets (12.5 mg total) by mouth daily. 04/26/20   Myles Gip, DO  OXYGEN Inhale 2 L into the lungs as needed.     [provider]  traZODone (DESYREL) 100 MG tablet Take 1 tablet (100 mg total) by mouth at bedtime as needed. for sleep 10/28/19   Towanda Malkin, MD    Allergies Codeine  Family History  Problem Relation Age of Onset  . Hypertension Mother   . Diabetes  Mellitus II Mother   . Dementia Mother   . Diabetes Mother   . Cancer - Other Father        brain tumor  . Cancer Father        brain tumor  . Breast cancer Maternal Aunt 57  . Cancer Maternal Aunt        ovarian  . Breast cancer Maternal Grandmother 44  . Cancer Maternal Grandmother        breast  . Diabetes Brother   . Hypertension Brother   . COPD Brother   . Cancer Maternal Grandfather        stomach  . Heart disease Paternal Grandmother   . Stroke Paternal Grandfather     Social History Social History   Tobacco Use  . Smoking status: Former Smoker    Packs/day: 0.50    Years:  15.00    Pack years: 7.50    Types: Cigarettes    Quit date: 04/25/2011    Years since quitting: 9.2  . Smokeless tobacco: Never Used  Vaping Use  . Vaping Use: Never used  Substance Use Topics  . Alcohol use: Yes    Alcohol/week: 7.0 standard drinks    Types: 7 Shots of liquor per week    Comment: occasional drinks  . Drug use: No     Review of Systems  Constitutional: No fever/chills Eyes: No visual changes. No discharge ENT: No upper respiratory complaints. Cardiovascular: no chest pain. Respiratory: no cough. No SOB. Gastrointestinal: No abdominal pain.  No nausea, no vomiting.  No diarrhea.  No constipation. Musculoskeletal: Positive for right foot pain Skin: Negative for rash, abrasions, lacerations, ecchymosis. Neurological: Negative for headaches, focal weakness or numbness.  10 System ROS otherwise negative.  ____________________________________________   PHYSICAL EXAM:  VITAL SIGNS: ED Triage Vitals  Enc Vitals Group     BP 07/15/20 1556 (!) 195/102     Pulse Rate 07/15/20 1556 (!) 104     Resp --      Temp 07/15/20 1556 98.9 F (37.2 C)     Temp Source 07/15/20 1556 Oral     SpO2 --      Weight --      Height --      Head Circumference --      Peak Flow --      Pain Score 07/15/20 1601 10     Pain Loc --      Pain Edu? --      Excl. in [place]? --       Constitutional: Alert and oriented. Well appearing and in no acute distress. Eyes: Conjunctivae are normal. PERRL. EOMI. Head: Atraumatic. ENT:      Ears:       Nose: No congestion/rhinnorhea.      Mouth/Throat: Mucous membranes are moist.  Neck: No stridor.    Cardiovascular: Normal rate, regular rhythm. Normal S1 and S2.  Good peripheral circulation. Respiratory: Normal respiratory effort without tachypnea or retractions. Lungs CTAB. Good air entry to the bases with no decreased or absent breath sounds. Musculoskeletal: Full range of motion to all extremities. No gross deformities appreciated.  Visualization of the right foot reveals edema about the midfoot region.  There is no edema of the ankle itself.  No edema of the toes.  Patient does have some ecchymosis along the plantar aspect of the foot.  No visible open wounds.  No deformity.  Patient able to extend and flex all digits appropriately. Neurologic:  Normal speech and language. No gross focal neurologic deficits are appreciated.  Skin:  Skin is warm, dry and intact. No rash noted. Psychiatric: Mood and affect are normal. Speech and behavior are normal. Patient exhibits appropriate insight and judgement.   ____________________________________________   LABS (all labs ordered are listed, but only abnormal results are displayed)  Labs Reviewed - No data to display ____________________________________________  EKG   ____________________________________________  RADIOLOGY I personally viewed and evaluated these images as part of my medical decision making, as well as reviewing the written report by the radiologist.  ED Provider Interpretation: Visualization of images reveals no evidence of fracture or subluxation  DG Foot Complete Right  Result Date: 07/15/2020 CLINICAL DATA:  Right foot pain after injury. EXAM: RIGHT FOOT COMPLETE - 3+ VIEW COMPARISON:  None. FINDINGS: There is no evidence of fracture or dislocation.  There is no evidence  of arthropathy or other focal bone abnormality. Soft tissues are unremarkable. IMPRESSION: Negative. Electronically Signed   By: Marijo Conception M.D.   On: 07/15/2020 16:42    ____________________________________________    PROCEDURES  Procedure(s) performed:    Procedures    Medications - No data to display   ____________________________________________   INITIAL IMPRESSION / ASSESSMENT AND PLAN / ED COURSE  Pertinent labs & imaging results that were available during my care of the patient were reviewed by me and considered in my medical decision making (see chart for details).  Review of the [place] CSRS was performed in accordance of the [place] prior to dispensing any controlled drugs.           Patient's diagnosis is consistent with foot sprain.  Patient presented to emergency department complaining of right foot pain after an injury 4 days ago.  Patient slipped on a wet floor but did not hit anything up to a sudden stop with her foot.  She is complaining primarily of pain to the midfoot.  This is primarily over the first metatarsal.  No palpable abnormality.  Imaging revealed no fracture or subluxations.  Patiently placed in postop shoe, prescribed medication for symptom relief.  Patient is given ED precautions to return to the ED for any worsening or new symptoms.     ____________________________________________  FINAL CLINICAL IMPRESSION(S) / ED DIAGNOSES  Final diagnoses:  Foot pain  Sprain of right foot, initial encounter      NEW MEDICATIONS STARTED DURING THIS VISIT:  ED Discharge Orders         Ordered    meloxicam (MOBIC) 15 MG tablet  Daily        07/15/20 1749    HYDROcodone-acetaminophen (NORCO/VICODIN) 5-325 MG tablet  Every 4 hours PRN        07/15/20 1749              This chart was dictated using voice recognition software/Dragon. Despite best efforts to proofread, errors can occur which can change the meaning. Any  change was purely unintentional.    Darletta Moll, PA-C 07/15/20 1749    Duffy Bruce, MD 07/15/20 1859

## 2020-07-15 NOTE — ED Triage Notes (Signed)
Pt comes into the ED via POV c/o right foot pain after slipping on her kitchen floor after mopping.  No deformity noted to the foot, but swelling present.  Pt in NAD.

## 2020-07-20 DIAGNOSIS — J449 Chronic obstructive pulmonary disease, unspecified: Secondary | ICD-10-CM | POA: Diagnosis not present

## 2020-08-02 ENCOUNTER — Ambulatory Visit: Payer: Medicare HMO

## 2020-08-19 DIAGNOSIS — J449 Chronic obstructive pulmonary disease, unspecified: Secondary | ICD-10-CM | POA: Diagnosis not present

## 2020-08-25 DIAGNOSIS — H04563 Stenosis of bilateral lacrimal punctum: Secondary | ICD-10-CM | POA: Diagnosis not present

## 2020-09-19 DIAGNOSIS — J449 Chronic obstructive pulmonary disease, unspecified: Secondary | ICD-10-CM | POA: Diagnosis not present

## 2020-10-04 ENCOUNTER — Ambulatory Visit: Payer: Medicare HMO

## 2020-10-04 DIAGNOSIS — J449 Chronic obstructive pulmonary disease, unspecified: Secondary | ICD-10-CM | POA: Diagnosis not present

## 2020-10-04 DIAGNOSIS — R0609 Other forms of dyspnea: Secondary | ICD-10-CM | POA: Diagnosis not present

## 2020-10-07 ENCOUNTER — Telehealth: Payer: Self-pay | Admitting: Family Medicine

## 2020-10-07 NOTE — Telephone Encounter (Signed)
PT called back bc she missed a call. Pt had a medicare wellness visit for today. Pt states that she has changed providers and now goes to an office in [place]. Please advise CB- [phone]

## 2020-10-13 ENCOUNTER — Ambulatory Visit: Payer: Medicare HMO

## 2020-10-20 DIAGNOSIS — J449 Chronic obstructive pulmonary disease, unspecified: Secondary | ICD-10-CM | POA: Diagnosis not present

## 2020-11-19 DIAGNOSIS — J449 Chronic obstructive pulmonary disease, unspecified: Secondary | ICD-10-CM | POA: Diagnosis not present

## 2020-11-25 DIAGNOSIS — H04563 Stenosis of bilateral lacrimal punctum: Secondary | ICD-10-CM | POA: Diagnosis not present

## 2020-11-25 DIAGNOSIS — H04561 Stenosis of right lacrimal punctum: Secondary | ICD-10-CM | POA: Diagnosis not present

## 2020-11-25 DIAGNOSIS — H04562 Stenosis of left lacrimal punctum: Secondary | ICD-10-CM | POA: Diagnosis not present

## 2020-12-20 DIAGNOSIS — J449 Chronic obstructive pulmonary disease, unspecified: Secondary | ICD-10-CM | POA: Diagnosis not present

## 2021-01-19 DIAGNOSIS — J449 Chronic obstructive pulmonary disease, unspecified: Secondary | ICD-10-CM | POA: Diagnosis not present

## 2021-01-19 DIAGNOSIS — H0259 Other disorders affecting eyelid function: Secondary | ICD-10-CM | POA: Diagnosis not present

## 2021-01-30 DIAGNOSIS — I10 Essential (primary) hypertension: Secondary | ICD-10-CM | POA: Diagnosis not present

## 2021-01-30 DIAGNOSIS — I700 Atherosclerosis of aorta: Secondary | ICD-10-CM | POA: Diagnosis not present

## 2021-01-30 DIAGNOSIS — R7301 Impaired fasting glucose: Secondary | ICD-10-CM | POA: Diagnosis not present

## 2021-01-30 DIAGNOSIS — J449 Chronic obstructive pulmonary disease, unspecified: Secondary | ICD-10-CM | POA: Diagnosis not present

## 2021-01-30 DIAGNOSIS — Z1231 Encounter for screening mammogram for malignant neoplasm of breast: Secondary | ICD-10-CM | POA: Diagnosis not present

## 2021-01-30 DIAGNOSIS — Z7185 Encounter for immunization safety counseling: Secondary | ICD-10-CM | POA: Diagnosis not present

## 2021-01-30 DIAGNOSIS — G4709 Other insomnia: Secondary | ICD-10-CM | POA: Diagnosis not present

## 2021-01-30 DIAGNOSIS — R911 Solitary pulmonary nodule: Secondary | ICD-10-CM | POA: Diagnosis not present

## 2021-01-30 DIAGNOSIS — L309 Dermatitis, unspecified: Secondary | ICD-10-CM | POA: Diagnosis not present

## 2021-01-31 ENCOUNTER — Other Ambulatory Visit: Payer: Self-pay | Admitting: Family Medicine

## 2021-01-31 DIAGNOSIS — R911 Solitary pulmonary nodule: Secondary | ICD-10-CM

## 2021-02-17 ENCOUNTER — Other Ambulatory Visit: Payer: Self-pay | Admitting: Family Medicine

## 2021-02-17 DIAGNOSIS — Z1231 Encounter for screening mammogram for malignant neoplasm of breast: Secondary | ICD-10-CM

## 2021-02-19 DIAGNOSIS — J449 Chronic obstructive pulmonary disease, unspecified: Secondary | ICD-10-CM | POA: Diagnosis not present

## 2021-03-02 DIAGNOSIS — I700 Atherosclerosis of aorta: Secondary | ICD-10-CM | POA: Diagnosis not present

## 2021-03-02 DIAGNOSIS — L309 Dermatitis, unspecified: Secondary | ICD-10-CM | POA: Diagnosis not present

## 2021-03-02 DIAGNOSIS — E876 Hypokalemia: Secondary | ICD-10-CM | POA: Diagnosis not present

## 2021-03-02 DIAGNOSIS — R7989 Other specified abnormal findings of blood chemistry: Secondary | ICD-10-CM | POA: Diagnosis not present

## 2021-03-02 DIAGNOSIS — I10 Essential (primary) hypertension: Secondary | ICD-10-CM | POA: Diagnosis not present

## 2021-03-02 DIAGNOSIS — J449 Chronic obstructive pulmonary disease, unspecified: Secondary | ICD-10-CM | POA: Diagnosis not present

## 2021-03-02 DIAGNOSIS — R911 Solitary pulmonary nodule: Secondary | ICD-10-CM | POA: Diagnosis not present

## 2021-03-02 DIAGNOSIS — Z2820 Immunization not carried out because of patient decision for unspecified reason: Secondary | ICD-10-CM | POA: Diagnosis not present

## 2021-03-02 DIAGNOSIS — G4709 Other insomnia: Secondary | ICD-10-CM | POA: Diagnosis not present

## 2021-03-08 ENCOUNTER — Other Ambulatory Visit: Payer: Self-pay

## 2021-03-08 ENCOUNTER — Ambulatory Visit: Payer: Medicare HMO

## 2021-03-08 ENCOUNTER — Ambulatory Visit
Admission: RE | Admit: 2021-03-08 | Discharge: 2021-03-08 | Disposition: A | Discharge status: Home / Self Care | Payer: Medicare HMO | Source: Ambulatory Visit | Attending: Family Medicine | Admitting: Family Medicine

## 2021-03-08 DIAGNOSIS — Z1231 Encounter for screening mammogram for malignant neoplasm of breast: Secondary | ICD-10-CM | POA: Diagnosis not present

## 2021-03-10 ENCOUNTER — Other Ambulatory Visit: Payer: Self-pay | Admitting: Specialist

## 2021-03-10 DIAGNOSIS — R918 Other nonspecific abnormal finding of lung field: Secondary | ICD-10-CM | POA: Diagnosis not present

## 2021-03-10 DIAGNOSIS — J449 Chronic obstructive pulmonary disease, unspecified: Secondary | ICD-10-CM | POA: Diagnosis not present

## 2021-03-14 ENCOUNTER — Encounter: Payer: Self-pay | Admitting: Ophthalmology

## 2021-03-14 ENCOUNTER — Encounter: Payer: Self-pay | Admitting: Anesthesiology

## 2021-03-22 DIAGNOSIS — J449 Chronic obstructive pulmonary disease, unspecified: Secondary | ICD-10-CM | POA: Diagnosis not present

## 2021-03-23 ENCOUNTER — Ambulatory Visit: Admission: RE | Admit: 2021-03-23 | Payer: Medicare HMO | Source: Home / Self Care | Admitting: Ophthalmology

## 2021-03-23 SURGERY — BLEPHAROPLASTY
Anesthesia: Monitor Anesthesia Care | Laterality: Bilateral

## 2021-04-19 DIAGNOSIS — J449 Chronic obstructive pulmonary disease, unspecified: Secondary | ICD-10-CM | POA: Diagnosis not present

## 2021-05-03 ENCOUNTER — Telehealth: Payer: Self-pay | Admitting: Family Medicine

## 2021-05-03 NOTE — Telephone Encounter (Signed)
Spoke with pt, no longer sees Dr. Ky Barban. ?Requested Prescriptions  ?Pending Prescriptions Disp Refills  ?? TRELEGY ELLIPTA 100-62.5-25 MCG/ACT AEPB [Pharmacy Med Name: TRELEGY ELLIPTA 100-62.5-25] 60 each 0  ?  Sig: Inhale 1 puff into the lungs daily.  ?  ? There is no refill protocol information for this order  ?  ? ? ?

## 2021-05-04 ENCOUNTER — Other Ambulatory Visit: Payer: Self-pay | Admitting: Family Medicine

## 2021-05-04 NOTE — Telephone Encounter (Signed)
As of 02-2021 patient is being seen at [facility] with Dr Bubba Camp

## 2021-05-20 DIAGNOSIS — J449 Chronic obstructive pulmonary disease, unspecified: Secondary | ICD-10-CM | POA: Diagnosis not present

## 2021-05-24 DIAGNOSIS — F419 Anxiety disorder, unspecified: Secondary | ICD-10-CM | POA: Diagnosis not present

## 2021-05-24 DIAGNOSIS — H02131 Senile ectropion of right upper eyelid: Secondary | ICD-10-CM | POA: Diagnosis not present

## 2021-05-24 DIAGNOSIS — I129 Hypertensive chronic kidney disease with stage 1 through stage 4 chronic kidney disease, or unspecified chronic kidney disease: Secondary | ICD-10-CM | POA: Diagnosis not present

## 2021-05-24 DIAGNOSIS — N189 Chronic kidney disease, unspecified: Secondary | ICD-10-CM | POA: Diagnosis not present

## 2021-05-24 DIAGNOSIS — H0259 Other disorders affecting eyelid function: Secondary | ICD-10-CM | POA: Diagnosis not present

## 2021-05-24 DIAGNOSIS — D631 Anemia in chronic kidney disease: Secondary | ICD-10-CM | POA: Diagnosis not present

## 2021-05-24 DIAGNOSIS — J449 Chronic obstructive pulmonary disease, unspecified: Secondary | ICD-10-CM | POA: Diagnosis not present

## 2021-05-24 DIAGNOSIS — H02101 Unspecified ectropion of right upper eyelid: Secondary | ICD-10-CM | POA: Diagnosis not present

## 2021-05-24 DIAGNOSIS — H02104 Unspecified ectropion of left upper eyelid: Secondary | ICD-10-CM | POA: Diagnosis not present

## 2021-05-24 DIAGNOSIS — H02134 Senile ectropion of left upper eyelid: Secondary | ICD-10-CM | POA: Diagnosis not present

## 2021-05-24 DIAGNOSIS — K219 Gastro-esophageal reflux disease without esophagitis: Secondary | ICD-10-CM | POA: Diagnosis not present

## 2021-06-19 DIAGNOSIS — S0501XD Injury of conjunctiva and corneal abrasion without foreign body, right eye, subsequent encounter: Secondary | ICD-10-CM | POA: Diagnosis not present

## 2021-06-19 DIAGNOSIS — X58XXXD Exposure to other specified factors, subsequent encounter: Secondary | ICD-10-CM | POA: Diagnosis not present

## 2021-06-19 DIAGNOSIS — J449 Chronic obstructive pulmonary disease, unspecified: Secondary | ICD-10-CM | POA: Diagnosis not present

## 2021-07-06 DIAGNOSIS — R918 Other nonspecific abnormal finding of lung field: Secondary | ICD-10-CM | POA: Diagnosis not present

## 2021-07-06 DIAGNOSIS — J449 Chronic obstructive pulmonary disease, unspecified: Secondary | ICD-10-CM | POA: Diagnosis not present

## 2021-07-06 DIAGNOSIS — Z9981 Dependence on supplemental oxygen: Secondary | ICD-10-CM | POA: Diagnosis not present

## 2021-07-06 DIAGNOSIS — Z122 Encounter for screening for malignant neoplasm of respiratory organs: Secondary | ICD-10-CM | POA: Diagnosis not present

## 2021-07-06 DIAGNOSIS — Z87891 Personal history of nicotine dependence: Secondary | ICD-10-CM | POA: Diagnosis not present

## 2021-07-07 ENCOUNTER — Other Ambulatory Visit: Payer: Self-pay | Admitting: Specialist

## 2021-07-07 DIAGNOSIS — R911 Solitary pulmonary nodule: Secondary | ICD-10-CM

## 2021-07-20 DIAGNOSIS — J449 Chronic obstructive pulmonary disease, unspecified: Secondary | ICD-10-CM | POA: Diagnosis not present

## 2021-07-20 DIAGNOSIS — H04563 Stenosis of bilateral lacrimal punctum: Secondary | ICD-10-CM | POA: Diagnosis not present

## 2021-08-10 DIAGNOSIS — H04563 Stenosis of bilateral lacrimal punctum: Secondary | ICD-10-CM | POA: Diagnosis not present

## 2021-08-19 DIAGNOSIS — J449 Chronic obstructive pulmonary disease, unspecified: Secondary | ICD-10-CM | POA: Diagnosis not present

## 2021-08-31 DIAGNOSIS — E876 Hypokalemia: Secondary | ICD-10-CM | POA: Diagnosis not present

## 2021-08-31 DIAGNOSIS — I700 Atherosclerosis of aorta: Secondary | ICD-10-CM | POA: Diagnosis not present

## 2021-08-31 DIAGNOSIS — E785 Hyperlipidemia, unspecified: Secondary | ICD-10-CM | POA: Diagnosis not present

## 2021-08-31 DIAGNOSIS — Z7951 Long term (current) use of inhaled steroids: Secondary | ICD-10-CM | POA: Diagnosis not present

## 2021-08-31 DIAGNOSIS — F40231 Fear of injections and transfusions: Secondary | ICD-10-CM | POA: Diagnosis not present

## 2021-08-31 DIAGNOSIS — G4709 Other insomnia: Secondary | ICD-10-CM | POA: Diagnosis not present

## 2021-08-31 DIAGNOSIS — R7989 Other specified abnormal findings of blood chemistry: Secondary | ICD-10-CM | POA: Diagnosis not present

## 2021-08-31 DIAGNOSIS — I10 Essential (primary) hypertension: Secondary | ICD-10-CM | POA: Diagnosis not present

## 2021-08-31 DIAGNOSIS — J449 Chronic obstructive pulmonary disease, unspecified: Secondary | ICD-10-CM | POA: Diagnosis not present

## 2021-09-07 DIAGNOSIS — H04563 Stenosis of bilateral lacrimal punctum: Secondary | ICD-10-CM | POA: Diagnosis not present

## 2021-09-19 DIAGNOSIS — J449 Chronic obstructive pulmonary disease, unspecified: Secondary | ICD-10-CM | POA: Diagnosis not present

## 2021-09-27 DIAGNOSIS — J449 Chronic obstructive pulmonary disease, unspecified: Secondary | ICD-10-CM | POA: Diagnosis not present

## 2021-09-27 DIAGNOSIS — R918 Other nonspecific abnormal finding of lung field: Secondary | ICD-10-CM | POA: Diagnosis not present

## 2021-09-27 DIAGNOSIS — Z9981 Dependence on supplemental oxygen: Secondary | ICD-10-CM | POA: Diagnosis not present

## 2021-09-27 DIAGNOSIS — R0602 Shortness of breath: Secondary | ICD-10-CM | POA: Diagnosis not present

## 2021-10-17 ENCOUNTER — Other Ambulatory Visit: Payer: Self-pay | Admitting: *Deleted

## 2021-10-17 DIAGNOSIS — Z87891 Personal history of nicotine dependence: Secondary | ICD-10-CM

## 2021-10-17 DIAGNOSIS — Z122 Encounter for screening for malignant neoplasm of respiratory organs: Secondary | ICD-10-CM

## 2021-10-20 DIAGNOSIS — J449 Chronic obstructive pulmonary disease, unspecified: Secondary | ICD-10-CM | POA: Diagnosis not present

## 2021-11-08 ENCOUNTER — Encounter: Payer: Self-pay | Admitting: Pulmonary Disease

## 2021-11-08 ENCOUNTER — Ambulatory Visit (INDEPENDENT_AMBULATORY_CARE_PROVIDER_SITE_OTHER): Payer: Medicare HMO | Admitting: Pulmonary Disease

## 2021-11-08 DIAGNOSIS — Z87891 Personal history of nicotine dependence: Secondary | ICD-10-CM | POA: Diagnosis not present

## 2021-11-08 DIAGNOSIS — Z122 Encounter for screening for malignant neoplasm of respiratory organs: Secondary | ICD-10-CM

## 2021-11-08 NOTE — Patient Instructions (Signed)
Thank you for participating in the [facility] Cancer Screening Program. It was our pleasure to meet you today. We will call you with the results of your scan within the next few days. Your scan will be assigned a Lung RADS category score by the physicians reading the scans.  This Lung RADS score determines follow up scanning.  See below for description of categories, and follow up screening recommendations. We will be in touch to schedule your follow up screening annually or based on recommendations of our providers. We will fax a copy of your scan results to your Primary Care Physician, or the physician who referred you to the program, to ensure they have the results. Please call the office if you have any questions or concerns regarding your scanning experience or results.  Our office number is [phone]. Please speak with Denise Phelps, RN. , or  Denise Buckner RN, They are  our Lung Cancer Screening RN.'s If They are unavailable when you call, Please leave a message on the voice mail. We will return your call at our earliest convenience.This voice mail is monitored several times a day.  Remember, if your scan is normal, we will scan you annually as long as you continue to meet the criteria for the program. (Age 55-77, Current smoker or smoker who has quit within the last 15 years). If you are a smoker, remember, quitting is the single most powerful action that you can take to decrease your risk of lung cancer and other pulmonary, breathing related problems. We know quitting is hard, and we are here to help.  Please let us know if there is anything we can do to help you meet your goal of quitting. If you are a former smoker, congratulations. We are proud of you! Remain smoke free! Remember you can refer friends or family members through the number above.  We will screen them to make sure they meet criteria for the program. Thank you for helping us take better care of you by  participating in Lung Screening.  You can receive free nicotine replacement therapy ( patches, gum or mints) by calling 1-800-QUIT NOW. Please call so we can get you on the path to becoming  a non-smoker. I know it is hard, but you can do this!  Lung RADS Categories:  Lung RADS 1: no nodules or definitely non-concerning nodules.  Recommendation is for a repeat annual scan in 12 months.  Lung RADS 2:  nodules that are non-concerning in appearance and behavior with a very low likelihood of becoming an active cancer. Recommendation is for a repeat annual scan in 12 months.  Lung RADS 3: nodules that are probably non-concerning , includes nodules with a low likelihood of becoming an active cancer.  Recommendation is for a 6-month repeat screening scan. Often noted after an upper respiratory illness. We will be in touch to make sure you have no questions, and to schedule your 6-month scan.  Lung RADS 4 A: nodules with concerning findings, recommendation is most often for a follow up scan in 3 months or additional testing based on our provider's assessment of the scan. We will be in touch to make sure you have no questions and to schedule the recommended 3 month follow up scan.  Lung RADS 4 B:  indicates findings that are concerning. We will be in touch with you to schedule additional diagnostic testing based on our provider's  assessment of the scan.  Other options for assistance in smoking cessation (   As covered by your insurance benefits)  Hypnosis for smoking cessation  [employer] [phone]  Acupuncture for smoking cessation  [facility] [phone]   

## 2021-11-08 NOTE — Progress Notes (Signed)
Shared Decision Making Visit Lung Cancer Screening Program [phone])   Eligibility: Age 66 y.o. Pack Years Smoking History Calculation 54 (quit 2013)  (# packs/per year x # years smoked) Recent History of coughing up blood  no Unexplained weight loss? no ( >Than 15 pounds within the last 6 months ) Prior History Lung / other cancer no (Diagnosis within the last 5 years already requiring surveillance chest CT Scans). Smoking Status Former Smoker Former Smokers: Years since quit: 10 years  Quit Date: 2013  Visit Components: Discussion included one or more decision making aids. yes Discussion included risk/benefits of screening. yes Discussion included potential follow up diagnostic testing for abnormal scans. yes Discussion included meaning and risk of over diagnosis. yes Discussion included meaning and risk of False Positives. yes Discussion included meaning of total radiation exposure. yes  Counseling Included: Importance of adherence to annual lung cancer LDCT screening. yes Impact of comorbidities on ability to participate in the program. yes Ability and willingness to under diagnostic treatment. yes  Smoking Cessation Counseling: Former Smokers:  Discussed the importance of maintaining cigarette abstinence. Yes Discussed how to acce Diagnosis Code: Personal History of Nicotine Dependence. D17.616 Information about tobacco cessation classes and interventions provided to patient. Yes Patient provided with "ticket" for LDCT Scan. yes Written Order for Lung Cancer Screening with LDCT placed in Epic. Yes (CT Chest Lung Cancer Screening Low Dose W/O CM) [id number] Z12.2-Screening of respiratory organs Z87.891-Personal history of nicotine dependence   Lauraine Rinne, NP

## 2021-11-09 ENCOUNTER — Ambulatory Visit
Admission: RE | Admit: 2021-11-09 | Discharge: 2021-11-09 | Disposition: A | Discharge status: Home / Self Care | Payer: Medicare HMO | Source: Ambulatory Visit | Attending: Acute Care | Admitting: Acute Care

## 2021-11-09 DIAGNOSIS — Z87891 Personal history of nicotine dependence: Secondary | ICD-10-CM | POA: Insufficient documentation

## 2021-11-09 DIAGNOSIS — Z122 Encounter for screening for malignant neoplasm of respiratory organs: Secondary | ICD-10-CM | POA: Insufficient documentation

## 2021-11-10 ENCOUNTER — Other Ambulatory Visit: Payer: Self-pay

## 2021-11-10 DIAGNOSIS — Z122 Encounter for screening for malignant neoplasm of respiratory organs: Secondary | ICD-10-CM

## 2021-11-10 DIAGNOSIS — Z87891 Personal history of nicotine dependence: Secondary | ICD-10-CM

## 2021-12-04 DIAGNOSIS — K760 Fatty (change of) liver, not elsewhere classified: Secondary | ICD-10-CM | POA: Insufficient documentation

## 2021-12-04 DIAGNOSIS — R7989 Other specified abnormal findings of blood chemistry: Secondary | ICD-10-CM | POA: Diagnosis not present

## 2021-12-04 DIAGNOSIS — E559 Vitamin D deficiency, unspecified: Secondary | ICD-10-CM | POA: Diagnosis not present

## 2021-12-04 DIAGNOSIS — E785 Hyperlipidemia, unspecified: Secondary | ICD-10-CM | POA: Diagnosis not present

## 2021-12-04 DIAGNOSIS — R911 Solitary pulmonary nodule: Secondary | ICD-10-CM | POA: Diagnosis not present

## 2021-12-04 DIAGNOSIS — E876 Hypokalemia: Secondary | ICD-10-CM | POA: Diagnosis not present

## 2021-12-04 DIAGNOSIS — R7301 Impaired fasting glucose: Secondary | ICD-10-CM | POA: Diagnosis not present

## 2021-12-04 DIAGNOSIS — J449 Chronic obstructive pulmonary disease, unspecified: Secondary | ICD-10-CM | POA: Diagnosis not present

## 2021-12-04 DIAGNOSIS — I700 Atherosclerosis of aorta: Secondary | ICD-10-CM | POA: Diagnosis not present

## 2021-12-04 DIAGNOSIS — I10 Essential (primary) hypertension: Secondary | ICD-10-CM | POA: Diagnosis not present

## 2021-12-05 DIAGNOSIS — H04563 Stenosis of bilateral lacrimal punctum: Secondary | ICD-10-CM | POA: Diagnosis not present

## 2021-12-27 ENCOUNTER — Emergency Department: Payer: Medicare HMO

## 2021-12-27 ENCOUNTER — Other Ambulatory Visit: Payer: Self-pay

## 2021-12-27 ENCOUNTER — Encounter: Payer: Self-pay | Admitting: Emergency Medicine

## 2021-12-27 ENCOUNTER — Emergency Department
Admission: EM | Admit: 2021-12-27 | Discharge: 2021-12-27 | Disposition: A | Discharge status: Home / Self Care | Payer: Medicare HMO | Attending: Emergency Medicine | Admitting: Emergency Medicine

## 2021-12-27 DIAGNOSIS — J181 Lobar pneumonia, unspecified organism: Secondary | ICD-10-CM | POA: Insufficient documentation

## 2021-12-27 DIAGNOSIS — Z1152 Encounter for screening for COVID-19: Secondary | ICD-10-CM | POA: Diagnosis not present

## 2021-12-27 DIAGNOSIS — R051 Acute cough: Secondary | ICD-10-CM

## 2021-12-27 DIAGNOSIS — J189 Pneumonia, unspecified organism: Secondary | ICD-10-CM

## 2021-12-27 DIAGNOSIS — J449 Chronic obstructive pulmonary disease, unspecified: Secondary | ICD-10-CM | POA: Insufficient documentation

## 2021-12-27 LAB — RESP PANEL BY RT-PCR (FLU A&B, COVID) ARPGX2
Influenza A by PCR: NEGATIVE
Influenza B by PCR: NEGATIVE
SARS Coronavirus 2 by RT PCR: NEGATIVE

## 2021-12-27 MED ORDER — CEFPODOXIME PROXETIL 200 MG PO TABS: 200.0000 mg | ORAL_TABLET | Freq: Two times a day (BID) | ORAL | 0 refills | Status: AC

## 2021-12-27 MED ORDER — IPRATROPIUM-ALBUTEROL 0.5-2.5 (3) MG/3ML IN SOLN
3.0000 mL | Freq: Once | RESPIRATORY_TRACT | Status: AC
  Administered 2021-12-27: 3 mL via RESPIRATORY_TRACT
  Filled 2021-12-27: qty 3

## 2021-12-27 MED ORDER — AZITHROMYCIN 250 MG PO TABS: ORAL_TABLET | ORAL | 0 refills | Status: DC

## 2021-12-27 MED ORDER — ACETAMINOPHEN 325 MG PO TABS
650.0000 mg | ORAL_TABLET | Freq: Once | ORAL | Status: AC
  Administered 2021-12-27: 650 mg via ORAL
  Filled 2021-12-27: qty 2

## 2021-12-27 NOTE — ED Triage Notes (Signed)
Patient to ED for cough along with multiple other symptoms- body aches, lack of appetite, and runny nose. Patient states her grandchild has recently been sick with a cold.

## 2021-12-27 NOTE — ED Provider Notes (Signed)
[facility] Provider Note    Event Date/Time   First MD Initiated Contact with Patient 12/27/21 [phone]     (approximate)   History   Cough   HPI  Raliyah Montella is a 66 y.o. female past medical history of COPD who presents today for evaluation of cough, body aches, and runny nose for the past 5 days.  She reports that her granddaughter had the same symptoms to begin with, and has passed to the patient.  Patient reports that her cough is productive.  She does not think that she has had any fever.  She has not had to use her home oxygen.  She denies chest pain or shortness of breath.  No chills.     Physical Exam   Triage Vital Signs: ED Triage Vitals  Enc Vitals Group     BP 12/27/21 0856 (!) 109/56     Pulse Rate 12/27/21 0856 95     Resp 12/27/21 0856 18     Temp 12/27/21 0856 98.3 F (36.8 C)     Temp Source 12/27/21 0856 Oral     SpO2 12/27/21 0856 97 %     Weight --      Height --      Head Circumference --      Peak Flow --      Pain Score 12/27/21 0855 0     Pain Loc --      Pain Edu? --      Excl. in [place]? --     Most recent vital signs: Vitals:   12/27/21 0856  BP: (!) 109/56  Pulse: 95  Resp: 18  Temp: 98.3 F (36.8 C)  SpO2: 97%    Physical Exam Vitals and nursing note reviewed.  Constitutional:      General: Awake and alert. No acute distress.    Appearance: Normal appearance. The patient is normal weight.  HENT:     Head: Normocephalic and atraumatic.     Mouth: Mucous membranes are moist.  Eyes:     General: PERRL. Normal EOMs        Right eye: No discharge.        Left eye: No discharge.     Conjunctiva/sclera: Conjunctivae normal.  Cardiovascular:     Rate and Rhythm: Normal rate and regular rhythm.     Pulses: Normal pulses.     Heart sounds: Normal heart sounds Pulmonary:     Effort: Pulmonary effort is normal. No respiratory distress.     Breath sounds: Normal breath sounds.  Abdominal:     Abdomen is  soft. There is no abdominal tenderness. No rebound or guarding. No distention. Musculoskeletal:        General: No swelling. Normal range of motion.     Cervical back: Normal range of motion and neck supple.  Skin:    General: Skin is warm and dry.     Capillary Refill: Capillary refill takes less than 2 seconds.     Findings: No rash.  Neurological:     Mental Status: The patient is awake and alert.      ED Results / Procedures / Treatments   Labs (all labs ordered are listed, but only abnormal results are displayed) Labs Reviewed  RESP PANEL BY RT-PCR (FLU A&B, COVID) [id number]     EKG     RADIOLOGY I independently reviewed and interpreted imaging and agree with radiologists findings.     PROCEDURES:  Critical Care performed:  Procedures   MEDICATIONS ORDERED IN ED: Medications  ipratropium-albuterol (DUONEB) 0.5-2.5 (3) MG/3ML nebulizer solution 3 mL (3 mLs Nebulization Given 12/27/21 0950)  acetaminophen (TYLENOL) tablet 650 mg (650 mg Oral Given 12/27/21 0950)     IMPRESSION / MDM / ASSESSMENT AND PLAN / ED COURSE  I reviewed the triage vital signs and the nursing notes.   Differential diagnosis includes, but is not limited to, COVID, flu, bronchitis, pneumonia.  Patient is awake and alert, hemodynamically stable and afebrile.  She has a normal oxygen saturation of 97% on room air.  She is able to speak easily in complete sentences, no accessory muscle use.  She is nontoxic in appearance.  COVID/flu/RSV test is negative.  Chest x-ray does reveal patchy bronchopneumonia in the right middle lobe and lingula.  We will treat for pneumonia, community-acquired.  Discussed findings with patient and husband.  She was treated symptomatically with a DuoNeb and Tylenol with improvement of her symptoms.  We discussed return precautions and the importance of close outpatient follow-up.  Per chart review, she has missed her recent CT chest, and I discussed with her the  importance of these tests.  She understands and agrees.   Patient's presentation is most consistent with acute complicated illness / injury requiring diagnostic workup.    FINAL CLINICAL IMPRESSION(S) / ED DIAGNOSES   Final diagnoses:  Acute cough  Community acquired pneumonia of left lower lobe of lung     Rx / DC Orders   ED Discharge Orders          Ordered    cefpodoxime (VANTIN) 200 MG tablet  2 times daily        12/27/21 1007    azithromycin (ZITHROMAX Z-PAK) 250 MG tablet        12/27/21 1007             Note:  This document was prepared using Dragon voice recognition software and may include unintentional dictation errors.   Emeline Gins 12/27/21 1143    Harvest Dark, MD 12/27/21 1454

## 2021-12-27 NOTE — Discharge Instructions (Signed)
Your COVID and flu test were negative.  Your x-ray shows a pneumonia.  Please take the antibiotics as prescribed.  Please return for any new, worsening, or change in symptoms or other concerns.  It was a pleasure caring for you today.

## 2021-12-27 NOTE — ED Notes (Addendum)
Pt via POV from home. Pt c/o flu like symptoms including fatigue, cough, nasal congestion, and lack of appetite for the past couple of days states that her grandchild was also sick. Pt is A&OX4 and NAD

## 2022-03-07 ENCOUNTER — Other Ambulatory Visit: Payer: Self-pay | Admitting: Family Medicine

## 2022-03-07 DIAGNOSIS — Z1231 Encounter for screening mammogram for malignant neoplasm of breast: Secondary | ICD-10-CM

## 2022-03-14 ENCOUNTER — Ambulatory Visit
Admission: RE | Admit: 2022-03-14 | Discharge: 2022-03-14 | Disposition: A | Discharge status: Home / Self Care | Payer: PPO | Source: Ambulatory Visit | Attending: Family Medicine | Admitting: Family Medicine

## 2022-03-14 DIAGNOSIS — Z1231 Encounter for screening mammogram for malignant neoplasm of breast: Secondary | ICD-10-CM | POA: Insufficient documentation

## 2022-03-16 DIAGNOSIS — K5730 Diverticulosis of large intestine without perforation or abscess without bleeding: Secondary | ICD-10-CM | POA: Insufficient documentation

## 2022-04-20 ENCOUNTER — Telehealth: Payer: Self-pay

## 2022-04-20 NOTE — Telephone Encounter (Signed)
Returned call from VM. Inquiry was regarding appointment. LDCT not due until September 2024.  Unable to reach by phone. Left VM and call back information

## 2022-05-18 ENCOUNTER — Other Ambulatory Visit: Payer: Self-pay | Admitting: Family Medicine

## 2022-05-18 DIAGNOSIS — K7469 Other cirrhosis of liver: Secondary | ICD-10-CM

## 2022-05-28 ENCOUNTER — Ambulatory Visit
Admission: RE | Admit: 2022-05-28 | Discharge: 2022-05-28 | Disposition: A | Discharge status: Home / Self Care | Payer: HMO | Source: Ambulatory Visit | Attending: Family Medicine | Admitting: Family Medicine

## 2022-05-28 DIAGNOSIS — K7469 Other cirrhosis of liver: Secondary | ICD-10-CM | POA: Diagnosis present

## 2022-08-22 ENCOUNTER — Other Ambulatory Visit: Payer: Self-pay | Admitting: Acute Care

## 2022-08-22 DIAGNOSIS — Z87891 Personal history of nicotine dependence: Secondary | ICD-10-CM

## 2022-08-22 DIAGNOSIS — Z122 Encounter for screening for malignant neoplasm of respiratory organs: Secondary | ICD-10-CM

## 2022-10-02 ENCOUNTER — Telehealth: Payer: Self-pay | Admitting: Gastroenterology

## 2022-10-02 NOTE — Telephone Encounter (Signed)
Patient called in wondering why she keep getting calls. I informed her I didn't see where anyone called but I did remind her of her appointment with Dr. Allegra Lai and I sent her a map and a reminder letter in the mail.

## 2022-10-20 LAB — COLOGUARD
COLOGUARD: NEGATIVE
COLOGUARD: NEGATIVE

## 2022-10-20 LAB — EXTERNAL GENERIC LAB PROCEDURE: COLOGUARD: NEGATIVE

## 2022-11-12 ENCOUNTER — Ambulatory Visit: Payer: HMO

## 2022-11-21 ENCOUNTER — Other Ambulatory Visit: Payer: Self-pay

## 2022-11-26 ENCOUNTER — Other Ambulatory Visit: Payer: Self-pay

## 2022-11-26 ENCOUNTER — Ambulatory Visit (INDEPENDENT_AMBULATORY_CARE_PROVIDER_SITE_OTHER): Payer: HMO | Admitting: Gastroenterology

## 2022-11-26 ENCOUNTER — Encounter: Payer: Self-pay | Admitting: Gastroenterology

## 2022-11-26 VITALS — BP 94/54 | HR 91 | Temp 98.3°F | Ht 67.0 in | Wt 148.5 lb

## 2022-11-26 DIAGNOSIS — E559 Vitamin D deficiency, unspecified: Secondary | ICD-10-CM | POA: Diagnosis not present

## 2022-11-26 DIAGNOSIS — K760 Fatty (change of) liver, not elsewhere classified: Secondary | ICD-10-CM

## 2022-11-26 DIAGNOSIS — D509 Iron deficiency anemia, unspecified: Secondary | ICD-10-CM

## 2022-11-26 DIAGNOSIS — K5909 Other constipation: Secondary | ICD-10-CM | POA: Diagnosis not present

## 2022-11-26 MED ORDER — NA SULFATE-K SULFATE-MG SULF 17.5-3.13-1.6 GM/177ML PO SOLN: 354.0000 mL | Freq: Once | ORAL | 0 refills | Status: AC

## 2022-11-26 NOTE — Progress Notes (Signed)
Arlyss Repress, MD [address]  [address]  [place], [place] [address]  Main: [phone]  Fax: [phone]    Gastroenterology Consultation  Referring Provider:     Cleon Dew, FNP Primary Care Physician:  Cleon Dew, FNP Primary Gastroenterologist:  Dr. Arlyss Repress Reason for Consultation: Microcytic anemia, fatty liver        HPI:   Andrea Bell is a 67 y.o. female referred by Cleon Dew, FNP  for consultation & management of microcytic anemia.  Patient has history of COPD, hypertension, hyperlipidemia is referred for evaluation of new onset of microcytic anemia.  Her hemoglobin was 8.3, MCV 77.1 based on the labs from 03/14/2022.  Her last normal hemoglobin was 12.7 in 01/2021.  Has severe vitamin D deficiency. Normal renal function.  Has mildly elevated AST and alkaline phosphatase.  Hepatitis C antibody negative in the past.  Ultrasound liver revealed fatty liver only.  Patient does report severe constipation, last bowel movement was on Thursday.  Prior to this, her bowel movement was 2 weeks ago.  She does report chronic constipation.  She takes laxative as needed.  She denies taking any iron supplements.  She did not have iron deficiency in the past.  She denies any abdominal pain, bloating, discomfort, nausea or vomiting, melena or rectal bleeding She does not smoke or drink alcohol She denies any family history of GI malignancy  NSAIDs: None  Antiplts/Anticoagulants/Anti thrombotics: None  GI Procedures: None  Past Medical History:  Diagnosis Date   Allergy    Anemia    Chronic back pain    Complication of anesthesia    slow to wake   COPD (chronic obstructive pulmonary disease) (HCC)    2l home oxygen   Dyspnea    GERD (gastroesophageal reflux disease)    Headache    HTN (hypertension)    Hyperlipidemia    Insomnia    Insomnia    Osteoarthritis    Osteoporosis    Oxygen deficiency    Tachycardia    Vertigo    none  recently   Wears dentures    partial lower    Past Surgical History:  Procedure Laterality Date   ABDOMINAL HYSTERECTOMY     CYST REMOVAL TRUNK     CYSTECTOMY     back and side of head   CYSTOSCOPY N/A 12/13/2015   Procedure: CYSTOSCOPY;  Surgeon: Vena Austria, MD;  Location: [facility];  Service: Gynecology;  Laterality: N/A;   LAPAROSCOPIC BILATERAL SALPINGO OOPHERECTOMY Bilateral 12/13/2015   Procedure: LAPAROSCOPIC BILATERAL SALPINGO OOPHORECTOMY;  Surgeon: Vena Austria, MD;  Location: [facility];  Service: Gynecology;  Laterality: Bilateral;   LAPAROSCOPIC HYSTERECTOMY N/A 12/13/2015   Procedure: HYSTERECTOMY TOTAL LAPAROSCOPIC;  Surgeon: Vena Austria, MD;  Location: [facility];  Service: Gynecology;  Laterality: N/A;   tear duct surgery     bilateral   TUBAL LIGATION      Current Outpatient Medications:    albuterol (PROVENTIL) (2.5 MG/3ML) 0.083% nebulizer solution, Take 3 mLs (2.5 mg total) by nebulization every 4 (four) hours as needed for wheezing or shortness of breath., Disp: 150 mL, Rfl: 3   albuterol (VENTOLIN HFA) 108 (90 Base) MCG/ACT inhaler, Inhale 2 puffs into the lungs every 6 (six) hours as needed for wheezing or shortness of breath., Disp: 1 each, Rfl: 3   amLODipine (NORVASC) 10 MG tablet, Take 1 tablet by mouth daily., Disp: , Rfl:    aspirin EC 81 MG tablet, Take 81 mg  by mouth daily., Disp: , Rfl:    atorvastatin (LIPITOR) 40 MG tablet, Take 1 tablet by mouth daily., Disp: , Rfl:    bisacodyl (DULCOLAX) 5 MG EC tablet, Take 5 mg by mouth daily as needed for moderate constipation., Disp: , Rfl:    Blood Pressure Monitoring (BLOOD PRESSURE CUFF) MISC, 1 each by Does not apply route daily., Disp: 1 each, Rfl: 0   cyclopentolate (CYCLODRYL,CYCLOGYL) 1 % ophthalmic solution, Place 1 drop into both eyes., Disp: , Rfl:    docusate sodium (COLACE) 100 MG capsule, Take 100 mg by mouth 2 (two) times daily as needed for mild constipation., Disp: , Rfl:    ferrous  sulfate 325 (65 FE) MG tablet, Take 1 tablet by mouth daily., Disp: , Rfl:    Fluticasone-Umeclidin-Vilant (TRELEGY ELLIPTA) 100-62.5-25 MCG/INH AEPB, Inhale 1 puff into the lungs daily., Disp: 1 each, Rfl: 11   meloxicam (MOBIC) 15 MG tablet, Take 1 tablet (15 mg total) by mouth daily., Disp: 30 tablet, Rfl: 0   metoprolol succinate (TOPROL-XL) 50 MG 24 hr tablet, Take 50 mg by mouth daily., Disp: , Rfl:    Na Sulfate-K Sulfate-Mg Sulf 17.5-3.13-1.6 GM/177ML SOLN, Take 354 mLs by mouth once for 1 dose., Disp: 354 mL, Rfl: 0   OXYGEN, Inhale 2 L into the lungs as needed. , Disp: , Rfl:    potassium chloride (KLOR-CON) 10 MEQ tablet, Take 10 mEq by mouth daily., Disp: , Rfl:    traZODone (DESYREL) 150 MG tablet, Take 150 mg by mouth at bedtime., Disp: , Rfl:    meclizine (ANTIVERT) 25 MG tablet, Take 1 tablet (25 mg total) by mouth 3 (three) times daily as needed for dizziness., Disp: 30 tablet, Rfl: 2   Family History  Problem Relation Age of Onset   Hypertension Mother    Diabetes Mellitus II Mother    Dementia Mother    Diabetes Mother    Cancer - Other Father        brain tumor   Cancer Father        brain tumor   Breast cancer Maternal Aunt 52   Cancer Maternal Aunt        ovarian   Breast cancer Maternal Grandmother 62   Cancer Maternal Grandmother        breast   Diabetes Brother    Hypertension Brother    COPD Brother    Cancer Maternal Grandfather        stomach   Heart disease Paternal Grandmother    Stroke Paternal Grandfather      Social History   Tobacco Use   Smoking status: Former    Current packs/day: 0.00    Average packs/day: 0.5 packs/day for 15.0 years (7.5 ttl pk-yrs)    Types: Cigarettes    Start date: 04/24/1996    Quit date: 04/25/2011    Years since quitting: 11.5   Smokeless tobacco: Never  Vaping Use   Vaping status: Never Used  Substance Use Topics   Alcohol use: Yes    Alcohol/week: 10.0 standard drinks of alcohol    Types: 10 Shots of  liquor per week    Comment: 1-2 drinks daily   Drug use: No    Allergies as of 11/26/2022 - Review Complete 11/26/2022  Allergen Reaction Noted   Codeine Nausea And Vomiting 05/25/2014    Review of Systems:    All systems reviewed and negative except where noted in HPI.   Physical Exam:  BP (!) 94/54 (  BP Location: Left Arm, Patient Position: Sitting, Cuff Size: Normal)   Pulse 91   Temp 98.3 F (36.8 C) (Oral)   Ht 5\' 7"  (1.702 m)   Wt 148 lb 8 oz (67.4 kg)   LMP 10/27/2015 Comment: had hysterctomy  BMI 23.26 kg/m  Patient's last menstrual period was 10/27/2015.  General:   Alert,  Well-developed, well-nourished, pleasant and cooperative in NAD Head:  Normocephalic and atraumatic. Eyes:  Sclera clear, no icterus.   Conjunctiva pink. Ears:  Normal auditory acuity. Nose:  No deformity, discharge, or lesions. Mouth:  No deformity or lesions,oropharynx pink & moist. Neck:  Supple; no masses or thyromegaly. Lungs:  Respirations even and unlabored.  Clear throughout to auscultation.   No wheezes, crackles, or rhonchi. No acute distress. Heart:  Regular rate and rhythm; no murmurs, clicks, rubs, or gallops. Abdomen:  Normal bowel sounds. Soft, non-tender and moderately distended, tympanic to percussion without masses, hepatosplenomegaly or hernias noted.  No guarding or rebound tenderness.   Rectal: Not performed Msk:  Symmetrical without gross deformities. Good, equal movement & strength bilaterally. Pulses:  Normal pulses noted. Extremities:  No clubbing or edema.  No cyanosis. Neurologic:  Alert and oriented x3;  grossly normal neurologically. Skin:  Intact without significant lesions or rashes. No jaundice. Psych:  Alert and cooperative. Normal mood and affect.  Imaging Studies: Reviewed  Assessment and Plan:   Andrea Bell is a 67 y.o. female with history of COPD, hyperlipidemia is seen in consultation for new onset of microcytic anemia, hepatic steatosis  Microcytic  anemia Check CBC, iron panel, B12 and folate levels Recommend EGD and colonoscopy with 2-day prep with possible TI evaluation, possible small bowel capsule endoscopy I have discussed alternative options, risks & benefits,  which include, but are not limited to, bleeding, infection, perforation,respiratory complication & drug reaction.  The patient agrees with this plan & written consent will be obtained.    Chronic constipation Recommend to start MiraLAX 34 g daily, decrease to 17 g daily if bowel movements are too loose  Severe vitamin D deficiency Check vitamin D levels  Hepatic steatosis, elevated LFTs Recheck LFTs today  Follow up in 3 months with PA-C, Lavonna Monarch, MD

## 2022-11-26 NOTE — Patient Instructions (Signed)
Take 2 cupfuls of Miralax daily with a large glass of water.

## 2022-11-27 ENCOUNTER — Telehealth: Payer: Self-pay

## 2022-11-27 ENCOUNTER — Emergency Department
Admission: EM | Admit: 2022-11-27 | Discharge: 2022-11-27 | Disposition: A | Discharge status: Home / Self Care | Payer: HMO | Attending: Emergency Medicine | Admitting: Emergency Medicine

## 2022-11-27 ENCOUNTER — Other Ambulatory Visit: Payer: Self-pay

## 2022-11-27 DIAGNOSIS — D649 Anemia, unspecified: Secondary | ICD-10-CM | POA: Diagnosis present

## 2022-11-27 LAB — BASIC METABOLIC PANEL
Anion gap: 9 (ref 5–15)
BUN: 13 mg/dL (ref 8–23)
CO2: 22 mmol/L (ref 22–32)
Calcium: 10 mg/dL (ref 8.9–10.3)
Chloride: 110 mmol/L (ref 98–111)
Creatinine, Ser: 1.39 mg/dL — ABNORMAL HIGH (ref 0.44–1.00)
GFR, Estimated: 42 mL/min — ABNORMAL LOW (ref >60)
Glucose, Bld: 134 mg/dL — ABNORMAL HIGH (ref 70–99)
Potassium: 2.9 mmol/L — ABNORMAL LOW (ref 3.5–5.1)
Sodium: 141 mmol/L (ref 135–145)

## 2022-11-27 LAB — CBC
HCT: 21.5 % — ABNORMAL LOW (ref 36.0–46.0)
Hemoglobin: 6.1 g/dL — ABNORMAL LOW (ref 12.0–15.0)
MCH: 21.2 pg — ABNORMAL LOW (ref 26.0–34.0)
MCHC: 28.4 g/dL — ABNORMAL LOW (ref 30.0–36.0)
MCV: 74.7 fL — ABNORMAL LOW (ref 80.0–100.0)
Platelets: 477 10*3/uL — ABNORMAL HIGH (ref 150–400)
RBC: 2.88 MIL/uL — ABNORMAL LOW (ref 3.87–5.11)
RDW: 19.5 % — ABNORMAL HIGH (ref 11.5–15.5)
WBC: 8.2 10*3/uL (ref 4.0–10.5)
nRBC: 0 % (ref 0.0–0.2)

## 2022-11-27 LAB — PREPARE RBC (CROSSMATCH)

## 2022-11-27 MED ORDER — POTASSIUM CHLORIDE CRYS ER 20 MEQ PO TBCR
40.0000 meq | EXTENDED_RELEASE_TABLET | Freq: Once | ORAL | Status: AC
  Administered 2022-11-27: 40 meq via ORAL
  Filled 2022-11-27: qty 2

## 2022-11-27 MED ORDER — IRON 325 (65 FE) MG PO TABS: 1.0000 | ORAL_TABLET | Freq: Every day | ORAL | 2 refills | Status: DC

## 2022-11-27 MED ORDER — SODIUM CHLORIDE 0.9 % IV SOLN: 10.0000 mL/h | Freq: Once | INTRAVENOUS | Status: DC

## 2022-11-27 NOTE — ED Triage Notes (Signed)
Pt here with weakness for a few days. Pt was told to come to the ED for a low hgb, 6. Pt denies pain and is ambulatory.

## 2022-11-27 NOTE — ED Provider Notes (Signed)
[facility] Provider Note    Event Date/Time   First MD Initiated Contact with Patient 11/27/22 1224     (approximate)   History   Low hemoglobin   HPI  Andrea Bell is a 67 y.o. female who presents to the emergency department today because of concern for low hemoglobin found on outpatient blood work.  Patient herself states that she has been in her normal state of health recently.  She does occasionally get short of breath but has oxygen that she uses as needed.  She denies any new weakness to myself.  Per chart review the patient had hemoglobin of 8.3 roughly 9 months ago and a hemoglobin of 7.6 roughly 3 months ago.      Physical Exam   Triage Vital Signs: ED Triage Vitals  Encounter Vitals Group     BP 11/27/22 1009 (!) 94/48     Systolic BP Percentile --      Diastolic BP Percentile --      Pulse Rate 11/27/22 1009 88     Resp 11/27/22 1010 17     Temp 11/27/22 1008 97.9 F (36.6 C)     Temp Source 11/27/22 1008 Oral     SpO2 11/27/22 1009 95 %     Weight 11/27/22 1008 148 lb 9.4 oz (67.4 kg)     Height 11/27/22 1008 5\' 7"  (1.702 m)     Head Circumference --      Peak Flow --      Pain Score 11/27/22 1008 0     Pain Loc --      Pain Education --      Exclude from Growth Chart --     Most recent vital signs: Vitals:   11/27/22 1009 11/27/22 1010  BP: (!) 94/48   Pulse: 88   Resp:  17  Temp:    SpO2: 95%    General: Awake, alert, oriented. CV:  Good peripheral perfusion. Regular rate and rhythm. Resp:  Normal effort. Lungs clear. Abd:  No distention.  Rectal:  Light brown stool on glove. GUIAC negative.    ED Results / Procedures / Treatments   Labs (all labs ordered are listed, but only abnormal results are displayed) Labs Reviewed  BASIC METABOLIC PANEL - Abnormal; Notable for the following components:      Result Value   Potassium 2.9 (*)    Glucose, Bld 134 (*)    Creatinine, Ser 1.39 (*)    GFR, Estimated 42 (*)     All other components within normal limits  CBC - Abnormal; Notable for the following components:   RBC 2.88 (*)    Hemoglobin 6.1 (*)    HCT 21.5 (*)    MCV 74.7 (*)    MCH 21.2 (*)    MCHC 28.4 (*)    RDW 19.5 (*)    Platelets 477 (*)    All other components within normal limits  URINALYSIS, ROUTINE W REFLEX MICROSCOPIC  CBG MONITORING, ED  TYPE AND SCREEN     EKG  I, Phineas Semen, attending physician, personally viewed and interpreted this EKG  EKG Time: 1013 Rate: 80 Rhythm: sinus rhythm Axis: left axis deviation Intervals: qtc 475 QRS: RBBB ST changes: no st elevation Impression: abnormal ekg   RADIOLOGY None  PROCEDURES:  Critical Care performed: Yes  CRITICAL CARE Performed by: Phineas Semen   Total critical care time: *** minutes  Critical care time was exclusive of separately billable procedures and  treating other patients.  Critical care was necessary to treat or prevent imminent or life-threatening deterioration.  Critical care was time spent personally by me on the following activities: development of treatment plan with patient and/or surrogate as well as nursing, discussions with consultants, evaluation of patient's response to treatment, examination of patient, obtaining history from patient or surrogate, ordering and performing treatments and interventions, ordering and review of laboratory studies, ordering and review of radiographic studies, pulse oximetry and re-evaluation of patient's condition.   Procedures    MEDICATIONS ORDERED IN ED: Medications - No data to display   IMPRESSION / MDM / ASSESSMENT AND PLAN / ED COURSE  I reviewed the triage vital signs and the nursing notes.                              Differential diagnosis includes, but is not limited to, chronic blood loss, iron deficiency, GI bleed  Patient's presentation is most consistent with acute presentation with potential threat to life or bodily  function.   The patient is on the cardiac monitor to evaluate for evidence of arrhythmia and/or significant heart rate changes.  ***      FINAL CLINICAL IMPRESSION(S) / ED DIAGNOSES   Final diagnoses:  None     Rx / DC Orders   ED Discharge Orders     None        Note:  This document was prepared using Dragon voice recognition software and may include unintentional dictation errors.

## 2022-11-27 NOTE — Telephone Encounter (Signed)
-----   Message from [facility] sent at 11/27/2022  7:43 AM EDT ----- Hb is 6, she has to go ER right away to get admitted and further workup   RV

## 2022-11-27 NOTE — Discharge Instructions (Signed)
Please seek medical attention for any high fevers, chest pain, shortness of breath, change in behavior, persistent vomiting, bloody stool or any other new or concerning symptoms.  

## 2022-11-27 NOTE — Telephone Encounter (Signed)
Per Dr Allegra Lai Please ask her to go to ER right away, Hb is 6.  Called and left a message for call back

## 2022-11-27 NOTE — Telephone Encounter (Signed)
Patient is in the ED now.

## 2022-11-27 NOTE — ED Notes (Signed)
Chaperoned MD during hemocult test

## 2022-11-27 NOTE — Telephone Encounter (Signed)
Called patient and left a message for call back. Called patient husband pauline pegues and asked to talk to patient. Informed patient and she states that she will go as soon she gets out of bed because she is still in bed. Informed her she needed to go as soon as possible. She states she will get up in a minute and go.

## 2022-11-28 ENCOUNTER — Telehealth: Payer: Self-pay

## 2022-11-28 LAB — TYPE AND SCREEN
ABO/RH(D): O POS
Antibody Screen: NEGATIVE
Unit division: 0

## 2022-11-28 LAB — HEPATIC FUNCTION PANEL
ALT: 27 [IU]/L (ref 0–32)
AST: 32 [IU]/L (ref 0–40)
Albumin: 3.6 g/dL — ABNORMAL LOW (ref 3.9–4.9)
Alkaline Phosphatase: 161 [IU]/L — ABNORMAL HIGH (ref 44–121)
Bilirubin Total: 1 mg/dL (ref 0.0–1.2)
Bilirubin, Direct: 0.69 mg/dL — ABNORMAL HIGH (ref 0.00–0.40)
Total Protein: 6.7 g/dL (ref 6.0–8.5)

## 2022-11-28 LAB — VITAMIN D 25 HYDROXY (VIT D DEFICIENCY, FRACTURES): Vit D, 25-Hydroxy: 8.5 ng/mL — ABNORMAL LOW (ref 30.0–100.0)

## 2022-11-28 LAB — CBC
Hematocrit: 19.9 % — ABNORMAL LOW (ref 34.0–46.6)
Hemoglobin: 6 g/dL — CL (ref 11.1–15.9)
MCH: 21.9 pg — ABNORMAL LOW (ref 26.6–33.0)
MCHC: 30.2 g/dL — ABNORMAL LOW (ref 31.5–35.7)
MCV: 73 fL — ABNORMAL LOW (ref 79–97)
Platelets: 426 10*3/uL (ref 150–450)
RBC: 2.74 x10E6/uL — CL (ref 3.77–5.28)
RDW: 17.4 % — ABNORMAL HIGH (ref 11.7–15.4)
WBC: 8.1 10*3/uL (ref 3.4–10.8)

## 2022-11-28 LAB — BPAM RBC
Blood Product Expiration Date: 202411052359
ISSUE DATE / TIME: 202410081414
Unit Type and Rh: 5100

## 2022-11-28 LAB — IRON,TIBC AND FERRITIN PANEL
Ferritin: 6 ng/mL — ABNORMAL LOW (ref 15–150)
Iron Saturation: 3 % — CL (ref 15–55)
Iron: 11 ug/dL — ABNORMAL LOW (ref 27–139)
Total Iron Binding Capacity: 351 ug/dL (ref 250–450)
UIBC: 340 ug/dL (ref 118–369)

## 2022-11-28 LAB — B12 AND FOLATE PANEL
Folate: 8 ng/mL (ref >3.0)
Vitamin B-12: 929 pg/mL (ref 232–1245)

## 2022-11-28 NOTE — Telephone Encounter (Signed)
[facility] called this morning about critcual results on the CBC Hemoglobin result. Informed them that Dr. Allegra Lai reviewed the result yesterday and we had sent patient to the emergency room.

## 2022-11-28 NOTE — Telephone Encounter (Signed)
Pulmonology has cleared patient to have colonoscopy with Dr. Allegra Lai. Have placed the authorization in the scanned basket to scan

## 2022-11-29 ENCOUNTER — Telehealth: Payer: Self-pay

## 2022-11-29 DIAGNOSIS — D509 Iron deficiency anemia, unspecified: Secondary | ICD-10-CM

## 2022-11-29 NOTE — Telephone Encounter (Signed)
Patient states she will go this morning to the [facility] inside the [facility] on [address] to have her lab drawn. Informed her we would call her when we receive the results

## 2022-11-29 NOTE — Telephone Encounter (Signed)
Order the CBC as STAT

## 2022-11-29 NOTE — Telephone Encounter (Signed)
-----   Message from Lannette Donath sent at 11/28/2022 12:03 PM EDT ----- Regarding: Follow-up labs Please order CBC for tomorrow  RV

## 2022-11-30 LAB — CBC WITH DIFFERENTIAL/PLATELET
Basophils Absolute: 0.1 10*3/uL (ref 0.0–0.2)
Basos: 1 %
EOS (ABSOLUTE): 0.3 10*3/uL (ref 0.0–0.4)
Eos: 3 %
Hematocrit: 24.5 % — ABNORMAL LOW (ref 34.0–46.6)
Hemoglobin: 7.3 g/dL — ABNORMAL LOW (ref 11.1–15.9)
Immature Grans (Abs): 0.1 10*3/uL (ref 0.0–0.1)
Immature Granulocytes: 1 %
Lymphocytes Absolute: 1.1 10*3/uL (ref 0.7–3.1)
Lymphs: 12 %
MCH: 22.5 pg — ABNORMAL LOW (ref 26.6–33.0)
MCHC: 29.8 g/dL — ABNORMAL LOW (ref 31.5–35.7)
MCV: 76 fL — ABNORMAL LOW (ref 79–97)
Monocytes Absolute: 0.9 10*3/uL (ref 0.1–0.9)
Monocytes: 10 %
Neutrophils Absolute: 7 10*3/uL (ref 1.4–7.0)
Neutrophils: 73 %
Platelets: 388 10*3/uL (ref 150–450)
RBC: 3.24 x10E6/uL — ABNORMAL LOW (ref 3.77–5.28)
RDW: 17.4 % — ABNORMAL HIGH (ref 11.7–15.4)
WBC: 9.5 10*3/uL (ref 3.4–10.8)

## 2022-12-03 ENCOUNTER — Telehealth: Payer: Self-pay

## 2022-12-03 DIAGNOSIS — D509 Iron deficiency anemia, unspecified: Secondary | ICD-10-CM

## 2022-12-03 NOTE — Progress Notes (Signed)
Good morning ladies!  Looks like this patient was seen by me at [facility] back in 2021 and is in need of IV iron ASAP.  Can we get her in to be seen soon with IV iron?  I do not believe she has received iron at [facility] in the past.  She lives in [place]. Thanks.   Durenda Hurt, NP 12/03/2022 8:28 AM

## 2022-12-03 NOTE — Telephone Encounter (Signed)
Called and left a message for call back  

## 2022-12-03 NOTE — Telephone Encounter (Signed)
Has appointment 12/06/2022 patient verbalized understanding

## 2022-12-03 NOTE — Telephone Encounter (Signed)
Placed urgent referral

## 2022-12-03 NOTE — Telephone Encounter (Signed)
-----   Message from Lannette Donath sent at 12/02/2022  9:53 PM EDT ----- Please urgent referral to oncology for parenteral iron therapy.  Hemoglobin is 7.3, slightly better since blood transfusion. Please have her stop taking oral iron if she is taking any to make sure if she is having black stools despite stopping iron supplements  RV

## 2022-12-06 ENCOUNTER — Inpatient Hospital Stay: Payer: HMO | Attending: Oncology | Admitting: Oncology

## 2022-12-06 ENCOUNTER — Encounter: Payer: Self-pay | Admitting: Oncology

## 2022-12-06 ENCOUNTER — Inpatient Hospital Stay: Payer: HMO

## 2022-12-06 VITALS — BP 132/80 | HR 94 | Temp 97.8°F | Resp 16 | Ht 67.0 in | Wt 158.5 lb

## 2022-12-06 DIAGNOSIS — Z87891 Personal history of nicotine dependence: Secondary | ICD-10-CM | POA: Insufficient documentation

## 2022-12-06 DIAGNOSIS — Z8041 Family history of malignant neoplasm of ovary: Secondary | ICD-10-CM | POA: Insufficient documentation

## 2022-12-06 DIAGNOSIS — D509 Iron deficiency anemia, unspecified: Secondary | ICD-10-CM | POA: Diagnosis present

## 2022-12-06 DIAGNOSIS — Z809 Family history of malignant neoplasm, unspecified: Secondary | ICD-10-CM

## 2022-12-06 DIAGNOSIS — Z803 Family history of malignant neoplasm of breast: Secondary | ICD-10-CM | POA: Diagnosis not present

## 2022-12-06 DIAGNOSIS — Z800 Family history of malignant neoplasm of digestive organs: Secondary | ICD-10-CM | POA: Diagnosis not present

## 2022-12-06 DIAGNOSIS — Z79899 Other long term (current) drug therapy: Secondary | ICD-10-CM | POA: Diagnosis not present

## 2022-12-06 DIAGNOSIS — Z808 Family history of malignant neoplasm of other organs or systems: Secondary | ICD-10-CM | POA: Diagnosis not present

## 2022-12-06 NOTE — Progress Notes (Signed)
[facility]  [phone]) [phone] Fax:(336) [phone]  ID: Andrea Bell OB: 1955-11-16  MR#: [id number]  AOZ#:[id number]  Patient Care Team: Andrea Dew, FNP as PCP - General (Family Medicine) Andrea Moores, MD as Referring Physician (Pulmonary Disease) Andrea Reil, MD as Consulting Physician (Gastroenterology) Andrea Ruths, MD as Consulting Physician (Oncology)  CHIEF COMPLAINT: Iron deficiency anemia.  INTERVAL HISTORY: Patient is a 67 year old female with a history of iron deficiency anemia who was recently sent to the emergency room with a hemoglobin of 6.0.  She received 1 unit of packed red blood cells with improvement to 7.3.  She is also noted to have significantly reduced iron stores.  She currently feels well.  She has no neurologic complaints.  She denies any recent fevers or illnesses.  She has a good appetite and denies weight loss.  She has no chest pain, shortness of breath, cough, or hemoptysis.  She denies any nausea, vomiting, constipation, or diarrhea.  She has no melena or hematochezia.  She has no urinary complaints.  Patient offers no specific complaints today.  REVIEW OF SYSTEMS:   Review of Systems  Constitutional: Negative.  Negative for fever, malaise/fatigue and weight loss.  Respiratory: Negative.  Negative for cough, hemoptysis and shortness of breath.   Cardiovascular: Negative.  Negative for chest pain and leg swelling.  Gastrointestinal: Negative.  Negative for abdominal pain, blood in stool and melena.  Genitourinary: Negative.  Negative for hematuria.  Musculoskeletal: Negative.  Negative for back pain.  Skin: Negative.  Negative for rash.  Neurological: Negative.  Negative for dizziness, focal weakness, weakness and headaches.  Psychiatric/Behavioral: Negative.      As per HPI. Otherwise, a complete review of systems is negative.  PAST MEDICAL HISTORY: Past Medical History:  Diagnosis Date   Allergy     Anemia    Chronic back pain    Complication of anesthesia    slow to wake   COPD (chronic obstructive pulmonary disease) (HCC)    2l home oxygen   Dyspnea    GERD (gastroesophageal reflux disease)    Headache    HTN (hypertension)    Hyperlipidemia    Insomnia    Insomnia    Osteoarthritis    Osteoporosis    Oxygen deficiency    Tachycardia    Vertigo    none recently   Wears dentures    partial lower    PAST SURGICAL HISTORY: Past Surgical History:  Procedure Laterality Date   ABDOMINAL HYSTERECTOMY     CYST REMOVAL TRUNK     CYSTECTOMY     back and side of head   CYSTOSCOPY N/A 12/13/2015   Procedure: CYSTOSCOPY;  Surgeon: Andrea Austria, MD;  Location: [facility] ORS;  Service: Gynecology;  Laterality: N/A;   LAPAROSCOPIC BILATERAL SALPINGO OOPHERECTOMY Bilateral 12/13/2015   Procedure: LAPAROSCOPIC BILATERAL SALPINGO OOPHORECTOMY;  Surgeon: Andrea Austria, MD;  Location: [facility] OR[facility];  Service: Gynecology;  Laterality: Bilateral;   LAPAROSCOPIC HYSTERECTOMY N/A 12/13/2015   Procedure: HYSTERECTOMY TOTAL LAPAROSCOPIC;  Surgeon: Andrea Austria, MD;  Location: [facility] OR[facility];  Service: Gynecology;  Laterality: N/A;   tear duct surgery     bilateral   TUBAL LIGATION      FAMILY HISTORY: Family History  Problem Relation Age of Onset   Hypertension Mother    Diabetes Mellitus II Mother    Dementia Mother    Diabetes Mother    Cancer - Other Father        brain tumor  Cancer Father        brain tumor   Breast cancer Maternal Aunt 57   Cancer Maternal Aunt        ovarian   Breast cancer Maternal Grandmother 20   Cancer Maternal Grandmother        breast   Diabetes Brother    Hypertension Brother    COPD Brother    Cancer Maternal Grandfather        stomach   Heart disease Paternal Grandmother    Stroke Paternal Grandfather     ADVANCED DIRECTIVES (Y/N):  N  HEALTH MAINTENANCE: Social History   Tobacco Use   Smoking status: Former    Current packs/day:  0.00    Average packs/day: 0.5 packs/day for 15.0 years (7.5 ttl pk-yrs)    Types: Cigarettes    Start date: 04/24/1996    Quit date: 04/25/2011    Years since quitting: 11.6   Smokeless tobacco: Never  Vaping Use   Vaping status: Never Used  Substance Use Topics   Alcohol use: Yes    Alcohol/week: 10.0 standard drinks of alcohol    Types: 10 Shots of liquor per week    Comment: 1-2 drinks daily   Drug use: No     Colonoscopy:  PAP:  Bone density:  Lipid panel:  Allergies  Allergen Reactions   Codeine Nausea And Vomiting    Was used while in hospital 02/24/14 and no reaction noted--01/06/15 [facility]    Current Outpatient Medications  Medication Sig Dispense Refill   albuterol (PROVENTIL) (2.5 MG/3ML) 0.083% nebulizer solution Take 3 mLs (2.5 mg total) by nebulization every 4 (four) hours as needed for wheezing or shortness of breath. 150 mL 3   albuterol (VENTOLIN HFA) 108 (90 Base) MCG/ACT inhaler Inhale 2 puffs into the lungs every 6 (six) hours as needed for wheezing or shortness of breath. 1 each 3   amLODipine (NORVASC) 10 MG tablet Take 1 tablet by mouth daily.     aspirin EC 81 MG tablet Take 81 mg by mouth daily.     atorvastatin (LIPITOR) 40 MG tablet Take 1 tablet by mouth daily.     bisacodyl (DULCOLAX) 5 MG EC tablet Take 5 mg by mouth daily as needed for moderate constipation.     Blood Pressure Monitoring (BLOOD PRESSURE CUFF) MISC 1 each by Does not apply route daily. 1 each 0   cyclopentolate (CYCLODRYL,CYCLOGYL) 1 % ophthalmic solution Place 1 drop into both eyes.     docusate sodium (COLACE) 100 MG capsule Take 100 mg by mouth 2 (two) times daily as needed for mild constipation.     Ferrous Sulfate (IRON) 325 (65 Fe) MG TABS Take 1 tablet (325 mg total) by mouth daily. 30 tablet 2   ferrous sulfate 325 (65 FE) MG tablet Take 1 tablet by mouth daily.     Fluticasone-Umeclidin-Vilant (TRELEGY ELLIPTA) 100-62.5-25 MCG/INH AEPB Inhale 1 puff into the lungs daily. 1 each 11    meclizine (ANTIVERT) 25 MG tablet Take 1 tablet (25 mg total) by mouth 3 (three) times daily as needed for dizziness. 30 tablet 2   meloxicam (MOBIC) 15 MG tablet Take 1 tablet (15 mg total) by mouth daily. 30 tablet 0   metoprolol succinate (TOPROL-XL) 50 MG 24 hr tablet Take 50 mg by mouth daily.     OXYGEN Inhale 2 L into the lungs as needed.      potassium chloride (KLOR-CON) 10 MEQ tablet Take 10 mEq by mouth daily.  traZODone (DESYREL) 150 MG tablet Take 150 mg by mouth at bedtime.     No current facility-administered medications for this visit.    OBJECTIVE: Vitals:   12/06/22 1129  BP: 132/80  Pulse: 94  Resp: 16  Temp: 97.8 F (36.6 C)  SpO2: 100%     Body mass index is 24.82 kg/m.    ECOG FS:0 - Asymptomatic  General: Well-developed, well-nourished, no acute distress. Eyes: Pink conjunctiva, anicteric sclera. HEENT: Normocephalic, moist mucous membranes. Lungs: No audible wheezing or coughing. Heart: Regular rate and rhythm. Abdomen: Soft, nontender, no obvious distention. Musculoskeletal: No edema, cyanosis, or clubbing. Neuro: Alert, answering all questions appropriately. Cranial nerves grossly intact. Skin: No rashes or petechiae noted. Psych: Normal affect. Lymphatics: No cervical, calvicular, axillary or inguinal LAD.   LAB RESULTS:  Lab Results  Component Value Date   NA 141 11/27/2022   K 2.9 (L) 11/27/2022   CL 110 11/27/2022   CO2 22 11/27/2022   GLUCOSE 134 (H) 11/27/2022   BUN 13 11/27/2022   CREATININE 1.39 (H) 11/27/2022   CALCIUM 10.0 11/27/2022   PROT 6.7 11/26/2022   ALBUMIN 3.6 (L) 11/26/2022   AST 32 11/26/2022   ALT 27 11/26/2022   ALKPHOS 161 (H) 11/26/2022   BILITOT 1.0 11/26/2022   GFRNONAA 42 (L) 11/27/2022   GFRAA 94 04/29/2019    Lab Results  Component Value Date   WBC 9.5 11/29/2022   NEUTROABS 7.0 11/29/2022   HGB 7.3 (L) 11/29/2022   HCT 24.5 (L) 11/29/2022   MCV 76 (L) 11/29/2022   PLT 388 11/29/2022   Lab  Results  Component Value Date   IRON 11 (L) 11/26/2022   TIBC 351 11/26/2022   IRONPCTSAT 3 (LL) 11/26/2022   Lab Results  Component Value Date   FERRITIN 6 (L) 11/26/2022     STUDIES: No results found.  ASSESSMENT: Iron deficiency anemia.  PLAN:    Iron deficiency anemia: Patient's hemoglobin improved to 7.3 with transfusion.  Her iron stores remain significantly decreased.  She does not require transfusion today, but will benefit from IV iron.  She also has a colonoscopy scheduled on December 31, 2022. Return to clinic 5 times over the next 2 to 3 weeks to receive 200 mg IV Venofer.  Patient will then return to clinic in 4 months with repeat laboratory work, further evaluation, and consideration of treatment if needed.  I spent a total of 45 minutes reviewing chart data, face-to-face evaluation with the patient, counseling and coordination of care as detailed above.   Patient expressed understanding and was in agreement with this plan. She also understands that She can call clinic at any time with any questions, concerns, or complaints.    Andrea Ruths, MD   12/06/2022 11:59 AM

## 2022-12-13 ENCOUNTER — Ambulatory Visit: Payer: HMO

## 2022-12-14 ENCOUNTER — Inpatient Hospital Stay: Payer: HMO

## 2022-12-14 VITALS — BP 127/64 | HR 86 | Temp 97.8°F

## 2022-12-14 DIAGNOSIS — D509 Iron deficiency anemia, unspecified: Secondary | ICD-10-CM | POA: Diagnosis not present

## 2022-12-14 MED ORDER — SODIUM CHLORIDE 0.9% FLUSH
10.0000 mL | Freq: Once | INTRAVENOUS | Status: AC | PRN
  Administered 2022-12-14: 10 mL
  Filled 2022-12-14: qty 10

## 2022-12-14 MED ORDER — SODIUM CHLORIDE 0.9% FLUSH
3.0000 mL | Freq: Once | INTRAVENOUS | Status: AC | PRN
  Administered 2022-12-14: 3 mL
  Filled 2022-12-14: qty 3

## 2022-12-14 MED ORDER — IRON SUCROSE 20 MG/ML IV SOLN
200.0000 mg | Freq: Once | INTRAVENOUS | Status: AC
  Administered 2022-12-14: 200 mg via INTRAVENOUS
  Filled 2022-12-14: qty 10

## 2022-12-20 ENCOUNTER — Inpatient Hospital Stay: Payer: HMO

## 2022-12-20 VITALS — BP 105/86 | HR 82 | Temp 97.5°F | Resp 18

## 2022-12-20 DIAGNOSIS — D509 Iron deficiency anemia, unspecified: Secondary | ICD-10-CM

## 2022-12-20 MED ORDER — IRON SUCROSE 20 MG/ML IV SOLN
200.0000 mg | Freq: Once | INTRAVENOUS | Status: AC
  Administered 2022-12-20: 200 mg via INTRAVENOUS

## 2022-12-20 NOTE — Patient Instructions (Signed)
Iron Sucrose Injection What is this medication? IRON SUCROSE (EYE ern SOO krose) treats low levels of iron (iron deficiency anemia) in people with kidney disease. Iron is a mineral that plays an important role in making red blood cells, which carry oxygen from your lungs to the rest of your body. This medicine may be used for other purposes; ask your health care provider or pharmacist if you have questions. COMMON BRAND NAME(S): Venofer What should I tell my care team before I take this medication? They need to know if you have any of these conditions: Anemia not caused by low iron levels Heart disease High levels of iron in the blood Kidney disease Liver disease An unusual or allergic reaction to iron, other medications, foods, dyes, or preservatives Pregnant or trying to get pregnant Breastfeeding How should I use this medication? This medication is for infusion into a vein. It is given in a hospital or clinic setting. Talk to your care team about the use of this medication in children. While this medication may be prescribed for children as young as 2 years for selected conditions, precautions do apply. Overdosage: If you think you have taken too much of this medicine contact a poison control center or emergency room at once. NOTE: This medicine is only for you. Do not share this medicine with others. What if I miss a dose? Keep appointments for follow-up doses. It is important not to miss your dose. Call your care team if you are unable to keep an appointment. What may interact with this medication? Do not take this medication with any of the following: Deferoxamine Dimercaprol Other iron products This medication may also interact with the following: Chloramphenicol Deferasirox This list may not describe all possible interactions. Give your health care provider a list of all the medicines, herbs, non-prescription drugs, or dietary supplements you use. Also tell them if you smoke,  drink alcohol, or use illegal drugs. Some items may interact with your medicine. What should I watch for while using this medication? Visit your care team regularly. Tell your care team if your symptoms do not start to get better or if they get worse. You may need blood work done while you are taking this medication. You may need to follow a special diet. Talk to your care team. Foods that contain iron include: whole grains/cereals, dried fruits, beans, or peas, leafy green vegetables, and organ meats (liver, kidney). What side effects may I notice from receiving this medication? Side effects that you should report to your care team as soon as possible: Allergic reactions--skin rash, itching, hives, swelling of the face, lips, tongue, or throat Low blood pressure--dizziness, feeling faint or lightheaded, blurry vision Shortness of breath Side effects that usually do not require medical attention (report to your care team if they continue or are bothersome): Flushing Headache Joint pain Muscle pain Nausea Pain, redness, or irritation at injection site This list may not describe all possible side effects. Call your doctor for medical advice about side effects. You may report side effects to FDA at 1-800-FDA-1088. Where should I keep my medication? This medication is given in a hospital or clinic. It will not be stored at home. NOTE: This sheet is a summary. It may not cover all possible information. If you have questions about this medicine, talk to your doctor, pharmacist, or health care provider.  2024 Elsevier/Gold Standard (2022-07-13 00:00:00)

## 2022-12-26 ENCOUNTER — Telehealth: Payer: Self-pay | Admitting: *Deleted

## 2022-12-26 NOTE — Telephone Encounter (Addendum)
Patient is to have a colonoscopy Monday and was told no vitamins for a week prior and she is scheduled to have Iron infusion tomorrow She is asking if that is ok or does she need to reschedule her iron infusion until after colonoscopy. I have placed a call to [facility] to see if she needs to reschedule iron infusion or not, I am awaiting a return call from them

## 2022-12-27 ENCOUNTER — Inpatient Hospital Stay: Payer: HMO

## 2022-12-27 ENCOUNTER — Encounter: Payer: Self-pay | Admitting: Oncology

## 2022-12-31 ENCOUNTER — Ambulatory Visit
Admission: RE | Admit: 2022-12-31 | Discharge: 2022-12-31 | Disposition: A | Discharge status: Home / Self Care | Payer: HMO | Attending: Gastroenterology | Admitting: Gastroenterology

## 2022-12-31 ENCOUNTER — Ambulatory Visit: Payer: HMO | Admitting: General Practice

## 2022-12-31 ENCOUNTER — Encounter: Payer: Self-pay | Admitting: Gastroenterology

## 2022-12-31 ENCOUNTER — Encounter
Admission: RE | Disposition: A | Discharge status: Home / Self Care | Payer: Self-pay | Source: Home / Self Care | Attending: Gastroenterology

## 2022-12-31 DIAGNOSIS — K562 Volvulus: Secondary | ICD-10-CM | POA: Diagnosis not present

## 2022-12-31 DIAGNOSIS — K2970 Gastritis, unspecified, without bleeding: Secondary | ICD-10-CM | POA: Diagnosis not present

## 2022-12-31 DIAGNOSIS — K2960 Other gastritis without bleeding: Secondary | ICD-10-CM | POA: Diagnosis not present

## 2022-12-31 DIAGNOSIS — Z87891 Personal history of nicotine dependence: Secondary | ICD-10-CM | POA: Diagnosis not present

## 2022-12-31 DIAGNOSIS — K314 Gastric diverticulum: Secondary | ICD-10-CM | POA: Insufficient documentation

## 2022-12-31 DIAGNOSIS — Z9981 Dependence on supplemental oxygen: Secondary | ICD-10-CM | POA: Insufficient documentation

## 2022-12-31 DIAGNOSIS — D500 Iron deficiency anemia secondary to blood loss (chronic): Secondary | ICD-10-CM | POA: Diagnosis present

## 2022-12-31 DIAGNOSIS — I10 Essential (primary) hypertension: Secondary | ICD-10-CM | POA: Insufficient documentation

## 2022-12-31 DIAGNOSIS — K644 Residual hemorrhoidal skin tags: Secondary | ICD-10-CM | POA: Diagnosis not present

## 2022-12-31 DIAGNOSIS — J449 Chronic obstructive pulmonary disease, unspecified: Secondary | ICD-10-CM | POA: Diagnosis not present

## 2022-12-31 DIAGNOSIS — K253 Acute gastric ulcer without hemorrhage or perforation: Secondary | ICD-10-CM

## 2022-12-31 DIAGNOSIS — Z79899 Other long term (current) drug therapy: Secondary | ICD-10-CM | POA: Insufficient documentation

## 2022-12-31 DIAGNOSIS — K449 Diaphragmatic hernia without obstruction or gangrene: Secondary | ICD-10-CM | POA: Insufficient documentation

## 2022-12-31 DIAGNOSIS — K31819 Angiodysplasia of stomach and duodenum without bleeding: Secondary | ICD-10-CM | POA: Diagnosis not present

## 2022-12-31 DIAGNOSIS — K259 Gastric ulcer, unspecified as acute or chronic, without hemorrhage or perforation: Secondary | ICD-10-CM | POA: Diagnosis not present

## 2022-12-31 DIAGNOSIS — K219 Gastro-esophageal reflux disease without esophagitis: Secondary | ICD-10-CM | POA: Insufficient documentation

## 2022-12-31 DIAGNOSIS — D509 Iron deficiency anemia, unspecified: Secondary | ICD-10-CM

## 2022-12-31 HISTORY — PX: BIOPSY: SHX5522

## 2022-12-31 HISTORY — PX: ESOPHAGOGASTRODUODENOSCOPY (EGD) WITH PROPOFOL: SHX5813

## 2022-12-31 HISTORY — PX: HOT HEMOSTASIS: SHX5433

## 2022-12-31 HISTORY — PX: COLONOSCOPY WITH PROPOFOL: SHX5780

## 2022-12-31 SURGERY — COLONOSCOPY WITH PROPOFOL
Anesthesia: General

## 2022-12-31 MED ORDER — SODIUM CHLORIDE 0.9 % IV SOLN
INTRAVENOUS | Status: DC
  Administered 2022-12-31: 20 mL/h via INTRAVENOUS

## 2022-12-31 MED ORDER — PROPOFOL 10 MG/ML IV BOLUS
INTRAVENOUS | Status: DC | PRN
  Administered 2022-12-31: 60 mg via INTRAVENOUS
  Administered 2022-12-31: 20 mg via INTRAVENOUS
  Administered 2022-12-31: 150 ug/kg/min via INTRAVENOUS
  Administered 2022-12-31: 20 mg via INTRAVENOUS

## 2022-12-31 MED ORDER — OMEPRAZOLE 40 MG PO CPDR: 40.0000 mg | DELAYED_RELEASE_CAPSULE | Freq: Every day | ORAL | 3 refills | Status: DC

## 2022-12-31 MED ORDER — PHENYLEPHRINE 80 MCG/ML (10ML) SYRINGE FOR IV PUSH (FOR BLOOD PRESSURE SUPPORT)
PREFILLED_SYRINGE | INTRAVENOUS | Status: DC | PRN
  Administered 2022-12-31: 80 ug via INTRAVENOUS
  Administered 2022-12-31: 160 ug via INTRAVENOUS

## 2022-12-31 MED ORDER — LIDOCAINE HCL (PF) 2 % IJ SOLN
INTRAMUSCULAR | Status: DC | PRN
  Administered 2022-12-31: 50 mg via INTRADERMAL

## 2022-12-31 NOTE — Transfer of Care (Signed)
Immediate Anesthesia Transfer of Care Note  Patient: Andrea Bell  Procedure(s) Performed: COLONOSCOPY WITH PROPOFOL ESOPHAGOGASTRODUODENOSCOPY (EGD) WITH PROPOFOL BIOPSY HOT HEMOSTASIS (ARGON PLASMA COAGULATION/BICAP)  Patient Location: Endoscopy Unit  Anesthesia Type:General  Level of Consciousness: awake, alert , and oriented  Airway & Oxygen Therapy: Patient Spontanous Breathing  Post-op Assessment: Report given to RN and Post -op Vital signs reviewed and stable  Post vital signs: Reviewed and stable  Last Vitals:  Vitals Value Taken Time  BP 99/48 12/31/22 1233  Temp 36.8 C 12/31/22 1233  Pulse 100 12/31/22 1234  Resp 13 12/31/22 1234  SpO2 100 % 12/31/22 1234  Vitals shown include unfiled device data.  Last Pain:  Vitals:   12/31/22 1233  TempSrc: Temporal  PainSc: 0-No pain         Complications: No notable events documented.

## 2022-12-31 NOTE — Op Note (Signed)
[facility] Gastroenterology Patient Name: Andrea Bell Procedure Date: 12/31/2022 11:33 AM MRN: [id number] Account #: [id number] Date of Birth: 1956/02/13 Admit Type: Outpatient Age: 67 Room: [facility] ENDO ROOM 4 Gender: Female Note Status: Finalized Instrument Name: Upper Endoscope [id number] Procedure:             Upper GI endoscopy Indications:           Iron deficiency anemia secondary to chronic blood loss Providers:             Toney Reil MD, MD Referring MD:          No Local Md, MD (Referring MD) Medicines:             General Anesthesia Complications:         No immediate complications. Estimated blood loss: None. Procedure:             Pre-Anesthesia Assessment:                        - Prior to the procedure, a History and Physical was                         performed, and patient medications and allergies were                         reviewed. The patient is competent. The risks and                         benefits of the procedure and the sedation options and                         risks were discussed with the patient. All questions                         were answered and informed consent was obtained.                         Patient identification and proposed procedure were                         verified by the physician, the nurse, the                         anesthesiologist, the anesthetist and the technician                         in the pre-procedure area in the procedure room in the                         endoscopy suite. Mental Status Examination: alert and                         oriented. Airway Examination: normal oropharyngeal                         airway and neck mobility. Respiratory Examination:                         clear to auscultation. CV Examination: normal.  Prophylactic Antibiotics: The patient does not require                         prophylactic antibiotics. Prior Anticoagulants: The                          patient has taken no anticoagulant or antiplatelet                         agents. ASA Grade Assessment: III - A patient with                         severe systemic disease. After reviewing the risks and                         benefits, the patient was deemed in satisfactory                         condition to undergo the procedure. The anesthesia                         plan was to use general anesthesia. Immediately prior                         to administration of medications, the patient was                         re-assessed for adequacy to receive sedatives. The                         heart rate, respiratory rate, oxygen saturations,                         blood pressure, adequacy of pulmonary ventilation, and                         response to care were monitored throughout the                         procedure. The physical status of the patient was                         re-assessed after the procedure.                        After obtaining informed consent, the endoscope was                         passed under direct vision. Throughout the procedure,                         the patient's blood pressure, pulse, and oxygen                         saturations were monitored continuously. The Endoscope                         was introduced through the mouth, and advanced to the  third part of duodenum. The upper GI endoscopy was                         accomplished without difficulty. The patient tolerated                         the procedure well. Findings:      A few diminutive angiodysplastic lesions without bleeding were found in       the second portion of the duodenum. Coagulation for hemostasis using       argon plasma was successful. Estimated blood loss: none.      A single 3 mm angiodysplastic lesion with no bleeding was found on the       greater curvature of the gastric antrum. Coagulation for hemostasis       using  argon plasma was successful. Estimated blood loss: none.      A small hiatal hernia was present.      One non-bleeding healing superficial gastric ulcer with a clean ulcer       base (Forrest Class III) was found on the lesser curvature of the       gastric antrum. The lesion was 5 mm in largest dimension.      Random gastric biopsies were taken with a cold forceps for histology.      A 10 mm non-bleeding diverticulum was found in the gastric fundus.      Esophagogastric landmarks were identified: the gastroesophageal junction       was found at 42 cm from the incisors.      Localized mucosal changes characterized by granularity were found at the       gastroesophageal junction. Biopsies were taken with a cold forceps for       histology.      The examined esophagus was normal. Impression:            - A few non-bleeding angiodysplastic lesions in the                         duodenum. Treated with argon plasma coagulation (APC).                        - A single non-bleeding angiodysplastic lesion in the                         stomach. Treated with argon plasma coagulation (APC).                        - Small hiatal hernia.                        - Non-bleeding gastric ulcer with a clean ulcer base                         (Forrest Class III).                        - Normal gastric body, incisura and antrum. Biopsied.                        - Gastric diverticulum.                        -  Esophagogastric landmarks identified.                        - Granular mucosa in the esophagus. Biopsied.                        - Normal esophagus. Recommendation:        - Await pathology results.                        - Proceed with colonoscopy as scheduled                        See colonoscopy report                        - Use a proton pump inhibitor PO daily. Procedure Code(s):     --- Professional ---                        [phone], 59, Esophagogastroduodenoscopy, flexible,                          transoral; with control of bleeding, any method                        43239, Esophagogastroduodenoscopy, flexible,                         transoral; with biopsy, single or multiple Diagnosis Code(s):     --- Professional ---                        K31.819, Angiodysplasia of stomach and duodenum                         without bleeding                        K44.9, Diaphragmatic hernia without obstruction or                         gangrene                        K25.9, Gastric ulcer, unspecified as acute or chronic,                         without hemorrhage or perforation                        K22.89, Other specified disease of esophagus                        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 All rights reserved. The codes documented in this report are preliminary and upon coder review may  be revised to meet current compliance requirements. Dr.  Ronini Elfriede Bonini Alger Memos MD, MD 12/31/2022 12:11:27 PM This report has been signed electronically. Number of Addenda: 0 Note Initiated On: 12/31/2022 11:33 AM Estimated Blood Loss:  Estimated blood loss: none.      [facility]

## 2022-12-31 NOTE — H&P (Signed)
Arlyss Repress, MD [address]  [address]  [place], [place] [address]  Main: [phone]  Fax: [phone] Pager: [phone]  Primary Care Physician:  Cleon Dew, FNP Primary Gastroenterologist:  Dr. Arlyss Repress  Pre-Procedure History & Physical: HPI:  Andrea Bell is a 67 y.o. female is here for an endoscopy and colonoscopy.   Past Medical History:  Diagnosis Date   Allergy    Anemia    Chronic back pain    Complication of anesthesia    slow to wake   COPD (chronic obstructive pulmonary disease) (HCC)    2l home oxygen   Dyspnea    GERD (gastroesophageal reflux disease)    Headache    HTN (hypertension)    Hyperlipidemia    Insomnia    Insomnia    Osteoarthritis    Osteoporosis    Oxygen deficiency    Tachycardia    Vertigo    none recently   Wears dentures    partial lower    Past Surgical History:  Procedure Laterality Date   ABDOMINAL HYSTERECTOMY     CYST REMOVAL TRUNK     CYSTECTOMY     back and side of head   CYSTOSCOPY N/A 12/13/2015   Procedure: CYSTOSCOPY;  Surgeon: Vena Austria, MD;  Location: [facility];  Service: Gynecology;  Laterality: N/A;   LAPAROSCOPIC BILATERAL SALPINGO OOPHERECTOMY Bilateral 12/13/2015   Procedure: LAPAROSCOPIC BILATERAL SALPINGO OOPHORECTOMY;  Surgeon: Vena Austria, MD;  Location: [facility];  Service: Gynecology;  Laterality: Bilateral;   LAPAROSCOPIC HYSTERECTOMY N/A 12/13/2015   Procedure: HYSTERECTOMY TOTAL LAPAROSCOPIC;  Surgeon: Vena Austria, MD;  Location: [facility];  Service: Gynecology;  Laterality: N/A;   tear duct surgery     bilateral   TUBAL LIGATION      Prior to Admission medications   Medication Sig Start Date End Date Taking? Authorizing Provider  albuterol (PROVENTIL) (2.5 MG/3ML) 0.083% nebulizer solution Take 3 mLs (2.5 mg total) by nebulization every 4 (four) hours as needed for wheezing or shortness of breath. 04/18/18  Yes Lada, Janit Bern, MD  albuterol (VENTOLIN HFA)  108 (90 Base) MCG/ACT inhaler Inhale 2 puffs into the lungs every 6 (six) hours as needed for wheezing or shortness of breath. 04/26/20  Yes Caro Laroche, DO  amLODipine (NORVASC) 10 MG tablet Take 1 tablet by mouth daily. 05/17/22  Yes [provider]  aspirin EC 81 MG tablet Take 81 mg by mouth daily.   Yes [provider]  atorvastatin (LIPITOR) 40 MG tablet Take 1 tablet by mouth daily. 05/02/22  Yes [provider]  bisacodyl (DULCOLAX) 5 MG EC tablet Take 5 mg by mouth daily as needed for moderate constipation.   Yes [provider]  Blood Pressure Monitoring (BLOOD PRESSURE CUFF) MISC 1 each by Does not apply route daily. 04/26/20  Yes Caro Laroche, DO  cyclopentolate (CYCLODRYL,CYCLOGYL) 1 % ophthalmic solution Place 1 drop into both eyes. 06/14/21  Yes [provider]  docusate sodium (COLACE) 100 MG capsule Take 100 mg by mouth 2 (two) times daily as needed for mild constipation.   Yes [provider]  Ferrous Sulfate (IRON) 325 (65 Fe) MG TABS Take 1 tablet (325 mg total) by mouth daily. 11/27/22  Yes Phineas Semen, MD  ferrous sulfate 325 (65 FE) MG tablet Take 1 tablet by mouth daily. 09/17/22 09/17/23 Yes [provider]  Fluticasone-Umeclidin-Vilant (TRELEGY ELLIPTA) 100-62.5-25 MCG/INH AEPB Inhale 1 puff into the lungs daily. 04/26/20  Yes  Caro Laroche, DO  meclizine (ANTIVERT) 25 MG tablet Take 1 tablet (25 mg total) by mouth 3 (three) times daily as needed for dizziness. 05/09/17  Yes Lada, Janit Bern, MD  meloxicam (MOBIC) 15 MG tablet Take 1 tablet (15 mg total) by mouth daily. 07/15/20  Yes Cuthriell, Delorise Royals, PA-C  metoprolol succinate (TOPROL-XL) 50 MG 24 hr tablet Take 50 mg by mouth daily. 09/17/22 09/17/23 Yes [provider]  OXYGEN Inhale 2 L into the lungs as needed.    Yes [provider]  potassium chloride (KLOR-CON) 10 MEQ tablet Take 10 mEq by mouth daily.   Yes [provider]  traZODone (DESYREL) 150 MG tablet Take 150 mg by mouth at bedtime. 09/17/22  Yes [provider]    Allergies as of 11/26/2022 - Review Complete 11/26/2022  Allergen Reaction Noted   Codeine Nausea And Vomiting 05/25/2014    Family History  Problem Relation Age of Onset   Hypertension Mother    Diabetes Mellitus II Mother    Dementia Mother    Diabetes Mother    Cancer - Other Father        brain tumor   Cancer Father        brain tumor   Breast cancer Maternal Aunt 10   Cancer Maternal Aunt        ovarian   Breast cancer Maternal Grandmother 53   Cancer Maternal Grandmother        breast   Diabetes Brother    Hypertension Brother    COPD Brother    Cancer Maternal Grandfather        stomach   Heart disease Paternal Grandmother    Stroke Paternal Grandfather     Social History   Socioeconomic History   Marital status: Married    Spouse name: Not on file   Number of children: 2   Years of education: Not on file   Highest education level: 12th grade  Occupational History   Not on file  Tobacco Use   Smoking status: Former    Current packs/day: 0.00    Average packs/day: 0.5 packs/day for 15.0 years (7.5 ttl pk-yrs)    Types: Cigarettes    Start date: 04/24/1996    Quit date: 04/25/2011    Years since quitting: 11.6   Smokeless tobacco: Never  Vaping Use   Vaping status: Never Used  Substance and Sexual Activity   Alcohol use: Yes    Alcohol/week: 10.0 standard drinks of alcohol    Types: 10 Shots of liquor per week    Comment: 1-2 drinks daily   Drug use: No   Sexual activity: Yes  Other Topics Concern   Not on file  Social History Narrative   Lives at home with husband.   Social Determinants of Health   Financial Resource Strain: Low Risk  (11/26/2022)   Received from [facility]   Overall Financial Resource Strain (CARDIA)    Difficulty of Paying Living Expenses: Not hard at all  Food Insecurity: Food Insecurity Present  (12/06/2022)   Hunger Vital Sign    Worried About Running Out of Food in the Last Year: Sometimes true    Ran Out of Food in the Last Year: Sometimes true  Transportation Needs: No Transportation Needs (12/06/2022)   PRAPARE - Administrator, Civil Service (Medical): No    Lack of Transportation (Non-Medical): No  Physical Activity: Inactive (04/17/2018)   Exercise Vital Sign  Days of Exercise per Week: 0 days    Minutes of Exercise per Session: 0 min  Stress: Stress Concern Present (04/17/2018)   [employer] of Occupational Health - Occupational Stress Questionnaire    Feeling of Stress : To some extent  Social Connections: Moderately Integrated (04/17/2018)   Social Connection and Isolation Panel [NHANES]    Frequency of Communication with Friends and Family: More than three times a week    Frequency of Social Gatherings with Friends and Family: More than three times a week    Attends Religious Services: More than 4 times per year    Active Member of [employer] or Organizations: No    Attends Banker Meetings: Never    Marital Status: Married  Catering manager Violence: Not At Risk (12/06/2022)   Humiliation, Afraid, Rape, and Kick questionnaire    Fear of Current or Ex-Partner: No    Emotionally Abused: No    Physically Abused: No    Sexually Abused: No    Review of Systems: See HPI, otherwise negative ROS  Physical Exam: BP (!) 154/110   Pulse (!) 108   Temp (!) 96.2 F (35.7 C) (Temporal)   Resp 20   Ht 5\' 7"  (1.702 m)   Wt 66.5 kg   LMP 10/27/2015 Comment: had hysterctomy  SpO2 100%   BMI 22.96 kg/m  General:   Alert,  pleasant and cooperative in NAD Head:  Normocephalic and atraumatic. Neck:  Supple; no masses or thyromegaly. Lungs:  Clear throughout to auscultation.    Heart:  Regular rate and rhythm. Abdomen:  Soft, nontender and nondistended. Normal bowel sounds, without guarding, and without rebound.   Neurologic:  Alert and   oriented x4;  grossly normal neurologically.  Impression/Plan: Andrea Bell is here for an endoscopy and colonoscopy to be performed for IDA  Risks, benefits, limitations, and alternatives regarding  endoscopy and colonoscopy have been reviewed with the patient.  Questions have been answered.  All parties agreeable.   Lannette Donath, MD  12/31/2022, 10:56 AM

## 2022-12-31 NOTE — Anesthesia Procedure Notes (Signed)
Date/Time: 12/31/2022 11:41 AM  Performed by: Ginger Carne, CRNAPre-anesthesia Checklist: Patient identified, Emergency Drugs available, Suction available, Patient being monitored and Timeout performed Patient Re-evaluated:Patient Re-evaluated prior to induction Oxygen Delivery Method: Nasal cannula Preoxygenation: Pre-oxygenation with 100% oxygen Induction Type: IV induction

## 2022-12-31 NOTE — Anesthesia Preprocedure Evaluation (Signed)
Anesthesia Evaluation  Patient identified by MRN, date of birth, ID band Patient awake    Reviewed: Allergy & Precautions, NPO status , Patient's Chart, lab work & pertinent test results, reviewed documented beta blocker date and time   Airway Mallampati: III       Dental  (+) Chipped   Pulmonary shortness of breath, with exertion and Long-Term Oxygen Therapy, COPD,  COPD inhaler and oxygen dependent, former smoker   Pulmonary exam normal        Cardiovascular hypertension, Pt. on medications and Pt. on home beta blockers Normal cardiovascular exam     Neuro/Psych negative neurological ROS  negative psych ROS   GI/Hepatic Neg liver ROS,GERD  Medicated and Controlled,,  Endo/Other  negative endocrine ROS    Renal/GU      Musculoskeletal   Abdominal   Peds  Hematology  (+) Blood dyscrasia, anemia   Anesthesia Other Findings   Reproductive/Obstetrics                             Anesthesia Physical Anesthesia Plan  ASA: 3  Anesthesia Plan: General   Post-op Pain Management: Minimal or no pain anticipated   Induction: Intravenous  PONV Risk Score and Plan: 3 and Propofol infusion, TIVA and Ondansetron  Airway Management Planned: Nasal Cannula  Additional Equipment: None  Intra-op Plan:   Post-operative Plan:   Informed Consent: I have reviewed the patients History and Physical, chart, labs and discussed the procedure including the risks, benefits and alternatives for the proposed anesthesia with the patient or authorized representative who has indicated his/her understanding and acceptance.     Dental advisory given  Plan Discussed with: CRNA and Surgeon  Anesthesia Plan Comments: (Discussed risks of anesthesia with patient, including possibility of difficulty with spontaneous ventilation under anesthesia necessitating airway intervention, PONV, and rare risks such as cardiac  or respiratory or neurological events, and allergic reactions. Discussed the role of CRNA in patient's perioperative care. Patient understands.)       Anesthesia Quick Evaluation

## 2022-12-31 NOTE — Op Note (Signed)
[facility] Gastroenterology Patient Name: Andrea Bell Procedure Date: 12/31/2022 11:32 AM MRN: [id number] Account #: [id number] Date of Birth: 1955-04-29 Admit Type: Outpatient Age: 67 Room: [facility] ENDO ROOM 4 Gender: Female Note Status: Finalized Instrument Name: Colonscope [id number] Procedure:             Colonoscopy Indications:           Last colonoscopy: October 2010, Iron deficiency anemia                         secondary to chronic blood loss Providers:             Toney Reil MD, MD Referring MD:          No Local Md, MD (Referring MD) Medicines:             General Anesthesia Complications:         No immediate complications. Estimated blood loss: None. Procedure:             Pre-Anesthesia Assessment:                        - Prior to the procedure, a History and Physical was                         performed, and patient medications and allergies were                         reviewed. The patient is competent. The risks and                         benefits of the procedure and the sedation options and                         risks were discussed with the patient. All questions                         were answered and informed consent was obtained.                         Patient identification and proposed procedure were                         verified by the physician, the nurse, the                         anesthesiologist, the anesthetist and the technician                         in the pre-procedure area in the procedure room in the                         endoscopy suite. Mental Status Examination: alert and                         oriented. Airway Examination: normal oropharyngeal                         airway and neck mobility. Respiratory Examination:  clear to auscultation. CV Examination: normal.                         Prophylactic Antibiotics: The patient does not require                          prophylactic antibiotics. Prior Anticoagulants: The                         patient has taken no anticoagulant or antiplatelet                         agents. ASA Grade Assessment: III - A patient with                         severe systemic disease. After reviewing the risks and                         benefits, the patient was deemed in satisfactory                         condition to undergo the procedure. The anesthesia                         plan was to use general anesthesia. Immediately prior                         to administration of medications, the patient was                         re-assessed for adequacy to receive sedatives. The                         heart rate, respiratory rate, oxygen saturations,                         blood pressure, adequacy of pulmonary ventilation, and                         response to care were monitored throughout the                         procedure. The physical status of the patient was                         re-assessed after the procedure.                        After obtaining informed consent, the colonoscope was                         passed under direct vision. Throughout the procedure,                         the patient's blood pressure, pulse, and oxygen                         saturations were monitored continuously. The  Colonoscope was introduced through the anus and                         advanced to the the terminal ileum, with                         identification of the appendiceal orifice and IC                         valve. The colonoscopy was performed with moderate                         difficulty due to significant looping and the                         patient's body habitus. Successful completion of the                         procedure was aided by applying abdominal pressure.                         The patient tolerated the procedure well. The quality                         of the  bowel preparation was fair. The terminal ileum,                         ileocecal valve, appendiceal orifice, and rectum were                         photographed. Findings:      The perianal and digital rectal examinations were normal. Pertinent       negatives include normal sphincter tone and no palpable rectal lesions.      The terminal ileum appeared normal.      The entire examined colon appeared normal.      Non-bleeding external hemorrhoids were found during retroflexion. The       hemorrhoids were medium-sized. Impression:            - Preparation of the colon was fair.                        - The examined portion of the ileum was normal.                        - The entire examined colon is normal.                        - Non-bleeding external hemorrhoids.                        - No specimens collected. Recommendation:        - Discharge patient to home (with escort).                        - Resume previous diet today.                        - Continue present medications. Procedure Code(s):     ---  Professional ---                        [phone], Colonoscopy, flexible; diagnostic, including                         collection of specimen(s) by brushing or washing, when                         performed (separate procedure) Diagnosis Code(s):     --- Professional ---                        K64.4, Residual hemorrhoidal skin tags                        D50.0, Iron deficiency anemia secondary to blood loss                         (chronic) CPT copyright 2022 American Medical Association. All rights reserved. The codes documented in this report are preliminary and upon coder review may  be revised to meet current compliance requirements. Dr. Libby Maw Toney Reil MD, MD 12/31/2022 12:31:59 PM This report has been signed electronically. Number of Addenda: 0 Note Initiated On: 12/31/2022 11:32 AM Scope Withdrawal Time: 0 hours 7 minutes 14 seconds  Total Procedure  Duration: 0 hours 14 minutes 0 seconds  Estimated Blood Loss:  Estimated blood loss: none.      [facility]

## 2022-12-31 NOTE — Anesthesia Postprocedure Evaluation (Signed)
Anesthesia Post Note  Patient: Andrea Bell  Procedure(s) Performed: COLONOSCOPY WITH PROPOFOL ESOPHAGOGASTRODUODENOSCOPY (EGD) WITH PROPOFOL BIOPSY HOT HEMOSTASIS (ARGON PLASMA COAGULATION/BICAP)  Patient location during evaluation: PACU Anesthesia Type: General Level of consciousness: awake and alert Pain management: pain level controlled Vital Signs Assessment: post-procedure vital signs reviewed and stable Respiratory status: spontaneous breathing, nonlabored ventilation, respiratory function stable and patient connected to nasal cannula oxygen Cardiovascular status: blood pressure returned to baseline and stable Postop Assessment: no apparent nausea or vomiting Anesthetic complications: no  No notable events documented.   Last Vitals:  Vitals:   12/31/22 1253 12/31/22 1303  BP:  (!) 127/98  Pulse: 76   Resp:    Temp:    SpO2: 97% 98%    Last Pain:  Vitals:   12/31/22 1303  TempSrc:   PainSc: 0-No pain                 Stephanie Coup

## 2023-01-01 LAB — SURGICAL PATHOLOGY

## 2023-01-03 ENCOUNTER — Inpatient Hospital Stay: Payer: HMO | Attending: Oncology

## 2023-01-03 VITALS — BP 121/55 | HR 92 | Temp 97.4°F | Resp 18

## 2023-01-03 DIAGNOSIS — D509 Iron deficiency anemia, unspecified: Secondary | ICD-10-CM | POA: Insufficient documentation

## 2023-01-03 MED ORDER — SODIUM CHLORIDE 0.9% FLUSH
10.0000 mL | Freq: Once | INTRAVENOUS | Status: AC | PRN
  Administered 2023-01-03: 10 mL
  Filled 2023-01-03: qty 10

## 2023-01-03 MED ORDER — IRON SUCROSE 20 MG/ML IV SOLN
200.0000 mg | Freq: Once | INTRAVENOUS | Status: AC
  Administered 2023-01-03: 200 mg via INTRAVENOUS
  Filled 2023-01-03: qty 10

## 2023-01-10 ENCOUNTER — Inpatient Hospital Stay: Payer: HMO

## 2023-01-24 ENCOUNTER — Inpatient Hospital Stay: Payer: HMO | Attending: Oncology

## 2023-01-24 VITALS — BP 90/54 | HR 78 | Temp 96.8°F | Resp 18

## 2023-01-24 DIAGNOSIS — D509 Iron deficiency anemia, unspecified: Secondary | ICD-10-CM | POA: Insufficient documentation

## 2023-01-24 MED ORDER — IRON SUCROSE 20 MG/ML IV SOLN
200.0000 mg | Freq: Once | INTRAVENOUS | Status: AC
  Administered 2023-01-24: 200 mg via INTRAVENOUS
  Filled 2023-01-24: qty 10

## 2023-01-24 MED ORDER — SODIUM CHLORIDE 0.9% FLUSH
10.0000 mL | Freq: Once | INTRAVENOUS | Status: AC | PRN
  Administered 2023-01-24: 10 mL
  Filled 2023-01-24: qty 10

## 2023-01-30 ENCOUNTER — Telehealth: Payer: Self-pay

## 2023-01-30 NOTE — Telephone Encounter (Signed)
Patient left  a voicemail stating she was calling because she received a recall letter to schedule a follow up appointment with Dr. Allegra Lai. Return patient call and schedule appointment for 03/06/2023

## 2023-01-31 ENCOUNTER — Inpatient Hospital Stay: Payer: HMO

## 2023-01-31 VITALS — BP 112/56 | HR 77 | Temp 95.7°F | Resp 19

## 2023-01-31 DIAGNOSIS — D509 Iron deficiency anemia, unspecified: Secondary | ICD-10-CM | POA: Diagnosis not present

## 2023-01-31 MED ORDER — IRON SUCROSE 20 MG/ML IV SOLN
200.0000 mg | Freq: Once | INTRAVENOUS | Status: AC
  Administered 2023-01-31: 200 mg via INTRAVENOUS

## 2023-01-31 MED ORDER — SODIUM CHLORIDE 0.9% FLUSH
10.0000 mL | Freq: Once | INTRAVENOUS | Status: AC | PRN
  Administered 2023-01-31: 10 mL
  Filled 2023-01-31: qty 10

## 2023-03-06 ENCOUNTER — Ambulatory Visit (INDEPENDENT_AMBULATORY_CARE_PROVIDER_SITE_OTHER): Payer: HMO | Admitting: Gastroenterology

## 2023-03-06 ENCOUNTER — Encounter: Payer: Self-pay | Admitting: Gastroenterology

## 2023-03-06 VITALS — BP 160/74 | HR 71 | Temp 97.7°F | Ht 67.0 in | Wt 155.5 lb

## 2023-03-06 DIAGNOSIS — D509 Iron deficiency anemia, unspecified: Secondary | ICD-10-CM | POA: Diagnosis not present

## 2023-03-06 DIAGNOSIS — Z8711 Personal history of peptic ulcer disease: Secondary | ICD-10-CM

## 2023-03-06 DIAGNOSIS — Z8719 Personal history of other diseases of the digestive system: Secondary | ICD-10-CM

## 2023-03-06 DIAGNOSIS — K760 Fatty (change of) liver, not elsewhere classified: Secondary | ICD-10-CM

## 2023-03-06 DIAGNOSIS — E559 Vitamin D deficiency, unspecified: Secondary | ICD-10-CM | POA: Diagnosis not present

## 2023-03-06 DIAGNOSIS — R748 Abnormal levels of other serum enzymes: Secondary | ICD-10-CM | POA: Diagnosis not present

## 2023-03-06 DIAGNOSIS — K31819 Angiodysplasia of stomach and duodenum without bleeding: Secondary | ICD-10-CM

## 2023-03-06 NOTE — Progress Notes (Signed)
 Karma Oz, MD [address]  [address]  [place], [place] [address]  Main: [phone]  Fax: [phone]    Gastroenterology Consultation  Referring Provider:     Suszanne Eriksson, FNP Primary Care Physician:  Suszanne Eriksson, FNP Primary Gastroenterologist:  Dr. Karma Oz Reason for Consultation: Severe iron  deficiency anemia, fatty liver        HPI:   Andrea Bell is a 68 y.o. female referred by Suszanne Eriksson, FNP  for consultation & management of microcytic anemia.  Patient has history of COPD, hypertension, hyperlipidemia is referred for evaluation of new onset of microcytic anemia.  Her hemoglobin was 8.3, MCV 77.1 based on the labs from 03/14/2022.  Her last normal hemoglobin was 12.7 in 01/2021.  Has severe vitamin D  deficiency. Normal renal function.  Has mildly elevated AST and alkaline phosphatase.  Hepatitis C antibody negative in the past.  Ultrasound liver revealed fatty liver only.  Patient does report severe constipation, last bowel movement was on Thursday.  Prior to this, her bowel movement was 2 weeks ago.  She does report chronic constipation.  She takes laxative as needed.  She denies taking any iron  supplements.  She did not have iron  deficiency in the past.  She denies any abdominal pain, bloating, discomfort, nausea or vomiting, melena or rectal bleeding She does not smoke or drink alcohol She denies any family history of GI malignancy  Follow-up visit 03/06/2023 Andrea Bell is here for follow-up of iron  deficiency anemia.  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  There was no evidence of H. pylori.  Colonoscopy was unremarkable.  She has been taking oral iron  over-the-counter once daily.  She denies any shortness of breath, fatigue or low energy levels.  She does not have any GI concerns today  NSAIDs: None  Antiplts/Anticoagulants/Anti thrombotics: None  GI  Procedures:  Upper endoscopy and colonoscopy 12/31/2022 - A few non- bleeding angiodysplastic lesions in the duodenum. Treated with argon plasma coagulation ( APC) . - A single non- bleeding angiodysplastic lesion in the stomach. Treated with argon plasma coagulation ( APC) . - Small hiatal hernia. - Non- bleeding gastric ulcer with a clean ulcer base ( Forrest Class III) . - Normal gastric body, incisura and antrum. Biopsied. - Gastric diverticulum. - Esophagogastric landmarks identified. - Granular mucosa in the esophagus. Biopsied. - Normal esophagus.  - Preparation of the colon was fair. - The examined portion of the ileum was normal. - The entire examined colon is normal. - Non- bleeding external hemorrhoids. INAL DIAGNOSIS        1. Stomach, biopsy, cbx :       - FOCAL EROSIVE GASTRITIS.       - NEGATIVE FOR H. PYLORI, INTESTINAL METAPLASIA, DYSPLASIA, AND MALIGNANCY.        2. Esophagogastric junction, biopsy, cbx :       - SUPERFICIAL FRAGMENTS OF SQUAMOUS AND GLANDULAR FOVEOLAR EPITHELIUM WITH       CHANGES CONSISTENT WITH REFLUX.       - NEGATIVE FOR INTESTINAL METAPLASIA, DYSPLASIA, AND MALIGNANCY.   Past Medical History:  Diagnosis Date   Allergy    Anemia    Chronic back pain    Complication of anesthesia    slow to wake   COPD (chronic obstructive pulmonary disease) (HCC)    2l home oxygen    Dyspnea    GERD (gastroesophageal reflux disease)  Headache    HTN (hypertension)    Hyperlipidemia    Insomnia    Insomnia    Osteoarthritis    Osteoporosis    Oxygen  deficiency    Tachycardia    Vertigo    none recently   Wears dentures    partial lower    Past Surgical History:  Procedure Laterality Date   ABDOMINAL HYSTERECTOMY     BIOPSY  12/31/2022   Procedure: BIOPSY;  Surgeon: Selena Daily, MD;  Location: [facility]OS[facility];  Service: Gastroenterology;;   COLONOSCOPY WITH PROPOFOL  N/A 12/31/2022   Procedure: COLONOSCOPY WITH PROPOFOL ;  Surgeon: Selena Daily, MD;  Location: [facility];  Service: Gastroenterology;  Laterality: N/A;   CYST REMOVAL TRUNK     CYSTECTOMY     back and side of head   CYSTOSCOPY N/A 12/13/2015   Procedure: CYSTOSCOPY;  Surgeon: Darl Edu, MD;  Location: [facility] OR[facility];  Service: Gynecology;  Laterality: N/A;   ESOPHAGOGASTRODUODENOSCOPY (EGD) WITH PROPOFOL  N/A 12/31/2022   Procedure: ESOPHAGOGASTRODUODENOSCOPY (EGD) WITH PROPOFOL ;  Surgeon: Selena Daily, MD;  Location: [facility];  Service: Gastroenterology;  Laterality: N/A;   HOT HEMOSTASIS  12/31/2022   Procedure: HOT HEMOSTASIS (ARGON PLASMA COAGULATION/BICAP);  Surgeon: Selena Daily, MD;  Location: [facility] ENDOSCOPY;  Service: Gastroenterology;;   LAPAROSCOPIC BILATERAL SALPINGO OOPHERECTOMY Bilateral 12/13/2015   Procedure: LAPAROSCOPIC BILATERAL SALPINGO OOPHORECTOMY;  Surgeon: Darl Edu, MD;  Location: [facility] OR[facility];  Service: Gynecology;  Laterality: Bilateral;   LAPAROSCOPIC HYSTERECTOMY N/A 12/13/2015   Procedure: HYSTERECTOMY TOTAL LAPAROSCOPIC;  Surgeon: Darl Edu, MD;  Location: [facility] OR[facility];  Service: Gynecology;  Laterality: N/A;   tear duct surgery     bilateral   TUBAL LIGATION      Current Outpatient Medications:    albuterol  (PROVENTIL ) (2.5 MG/3ML) 0.083% nebulizer solution, Take 3 mLs (2.5 mg total) by nebulization every 4 (four) hours as needed for wheezing or shortness of breath., Disp: 150 mL, Rfl: 3   albuterol  (VENTOLIN  HFA) 108 (90 Base) MCG/ACT inhaler, Inhale 2 puffs into the lungs every 6 (six) hours as needed for wheezing or shortness of breath., Disp: 1 each, Rfl: 3   amLODipine  (NORVASC ) 10 MG tablet, Take 1 tablet by mouth daily., Disp: , Rfl:    aspirin  EC 81 MG tablet, Take 81 mg by mouth daily., Disp: , Rfl:    atorvastatin  (LIPITOR) 40 MG tablet, Take 1 tablet by mouth daily., Disp: , Rfl:    bisacodyl  (DULCOLAX) 5 MG EC tablet, Take 5 mg by mouth daily as needed for moderate constipation.,  Disp: , Rfl:    Blood Pressure Monitoring (BLOOD PRESSURE CUFF) MISC, 1 each by Does not apply route daily., Disp: 1 each, Rfl: 0   cyclopentolate (CYCLODRYL,CYCLOGYL) 1 % ophthalmic solution, Place 1 drop into both eyes., Disp: , Rfl:    docusate sodium  (COLACE) 100 MG capsule, Take 100 mg by mouth 2 (two) times daily as needed for mild constipation., Disp: , Rfl:    Ferrous Sulfate (IRON ) 325 (65 Fe) MG TABS, Take 1 tablet (325 mg total) by mouth daily., Disp: 30 tablet, Rfl: 2   Fluticasone-Umeclidin-Vilant (TRELEGY ELLIPTA ) 100-62.5-25 MCG/INH AEPB, Inhale 1 puff into the lungs daily., Disp: 1 each, Rfl: 11   meclizine  (ANTIVERT ) 25 MG tablet, Take 1 tablet (25 mg total) by mouth 3 (three) times daily as needed for dizziness., Disp: 30 tablet, Rfl: 2   meloxicam  (MOBIC ) 15 MG tablet, Take 1 tablet (15 mg total) by mouth daily., Disp: 30  tablet, Rfl: 0   metoprolol  succinate (TOPROL -XL) 50 MG 24 hr tablet, Take 50 mg by mouth daily., Disp: , Rfl:    omeprazole  (PRILOSEC) 40 MG capsule, Take 1 capsule (40 mg total) by mouth daily before breakfast., Disp: 30 capsule, Rfl: 3   OXYGEN , Inhale 2 L into the lungs as needed. , Disp: , Rfl:    potassium chloride  (KLOR-CON ) 10 MEQ tablet, Take 10 mEq by mouth daily., Disp: , Rfl:    traZODone  (DESYREL ) 150 MG tablet, Take 150 mg by mouth at bedtime., Disp: , Rfl:    Family History  Problem Relation Age of Onset   Hypertension Mother    Diabetes Mellitus II Mother    Dementia Mother    Diabetes Mother    Cancer - Other Father        brain tumor   Cancer Father        brain tumor   Breast cancer Maternal Aunt 69   Cancer Maternal Aunt        ovarian   Breast cancer Maternal Grandmother 46   Cancer Maternal Grandmother        breast   Diabetes Brother    Hypertension Brother    COPD Brother    Cancer Maternal Grandfather        stomach   Heart disease Paternal Grandmother    Stroke Paternal Grandfather      Social History   Tobacco  Use   Smoking status: Former    Current packs/day: 0.00    Average packs/day: 0.5 packs/day for 15.0 years (7.5 ttl pk-yrs)    Types: Cigarettes    Start date: 04/24/1996    Quit date: 04/25/2011    Years since quitting: 11.8   Smokeless tobacco: Never  Vaping Use   Vaping status: Never Used  Substance Use Topics   Alcohol use: Yes    Alcohol/week: 10.0 standard drinks of alcohol    Types: 10 Shots of liquor per week    Comment: 1-2 drinks daily   Drug use: No    Allergies as of 03/06/2023 - Review Complete 03/06/2023  Allergen Reaction Noted   Codeine  Nausea And Vomiting 05/25/2014    Review of Systems:    All systems reviewed and negative except where noted in HPI.   Physical Exam:  BP (!) 160/74 (BP Location: Left Arm, Patient Position: Sitting, Cuff Size: Normal)   Pulse 71   Temp 97.7 F (36.5 C) (Oral)   Ht 5\' 7"  (1.702 m)   Wt 155 lb 8 oz (70.5 kg)   LMP 10/27/2015 Comment: had hysterctomy  BMI 24.35 kg/m  Patient's last menstrual period was 10/27/2015.  General:   Alert,  Well-developed, well-nourished, pleasant and cooperative in NAD Head:  Normocephalic and atraumatic. Eyes:  Sclera clear, no icterus.   Conjunctiva pink. Ears:  Normal auditory acuity. Nose:  No deformity, discharge, or lesions. Mouth:  No deformity or lesions,oropharynx pink & moist. Neck:  Supple; no masses or thyromegaly. Lungs:  Respirations even and unlabored.  Clear throughout to auscultation.   No wheezes, crackles, or rhonchi. No acute distress. Heart:  Regular rate and rhythm; no murmurs, clicks, rubs, or gallops. Abdomen:  Normal bowel sounds. Soft, non-tender and moderately distended, tympanic to percussion without masses, hepatosplenomegaly or hernias noted.  No guarding or rebound tenderness.   Rectal: Not performed Msk:  Symmetrical without gross deformities. Good, equal movement & strength bilaterally. Pulses:  Normal pulses noted. Extremities:  No clubbing or edema.  No  cyanosis. Neurologic:  Alert and oriented x3;  grossly normal neurologically. Skin:  Intact without significant lesions or rashes. No jaundice. Psych:  Alert and cooperative. Normal mood and affect.  Imaging Studies: Reviewed  Assessment and Plan:   Andrea Bell is a 68 y.o. female with history of COPD, hyperlipidemia is seen in consultation for new onset of microcytic anemia, hepatic steatosis  Iron  deficiency anemia secondary to gastric ulcer and gastric, duodenal AVM Status post EGD and colonoscopy Recheck CBC, iron  panel, B12 and folate levels, if continues to remain persistently severe iron  deficiency anemia, recommend repeat EGD and video capsule endoscopy Continue oral iron  once daily  Hepatic steatosis Normal transaminases, mildly elevated alkaline phosphatase secondary to severe vitamin D  deficiency Viral hepatitis panel negative  Follow up in 3 months   Karma Oz, MD

## 2023-04-05 ENCOUNTER — Other Ambulatory Visit: Payer: Self-pay | Admitting: *Deleted

## 2023-04-05 DIAGNOSIS — D509 Iron deficiency anemia, unspecified: Secondary | ICD-10-CM

## 2023-04-08 ENCOUNTER — Inpatient Hospital Stay: Payer: HMO

## 2023-04-10 ENCOUNTER — Inpatient Hospital Stay: Payer: HMO | Admitting: Oncology

## 2023-04-10 ENCOUNTER — Inpatient Hospital Stay: Payer: HMO

## 2023-04-17 ENCOUNTER — Inpatient Hospital Stay: Payer: HMO | Attending: Oncology

## 2023-04-17 DIAGNOSIS — Z87891 Personal history of nicotine dependence: Secondary | ICD-10-CM | POA: Diagnosis not present

## 2023-04-17 DIAGNOSIS — D509 Iron deficiency anemia, unspecified: Secondary | ICD-10-CM | POA: Diagnosis present

## 2023-04-17 DIAGNOSIS — Z809 Family history of malignant neoplasm, unspecified: Secondary | ICD-10-CM | POA: Insufficient documentation

## 2023-04-17 DIAGNOSIS — Z8041 Family history of malignant neoplasm of ovary: Secondary | ICD-10-CM | POA: Insufficient documentation

## 2023-04-17 DIAGNOSIS — Z79899 Other long term (current) drug therapy: Secondary | ICD-10-CM | POA: Diagnosis not present

## 2023-04-17 DIAGNOSIS — Z803 Family history of malignant neoplasm of breast: Secondary | ICD-10-CM | POA: Diagnosis not present

## 2023-04-17 LAB — CBC WITH DIFFERENTIAL/PLATELET
Abs Immature Granulocytes: 0.02 10*3/uL (ref 0.00–0.07)
Basophils Absolute: 0.1 10*3/uL (ref 0.0–0.1)
Basophils Relative: 1 %
Eosinophils Absolute: 0.1 10*3/uL (ref 0.0–0.5)
Eosinophils Relative: 2 %
HCT: 29.9 % — ABNORMAL LOW (ref 36.0–46.0)
Hemoglobin: 8.9 g/dL — ABNORMAL LOW (ref 12.0–15.0)
Immature Granulocytes: 1 %
Lymphocytes Relative: 14 %
Lymphs Abs: 0.6 10*3/uL — ABNORMAL LOW (ref 0.7–4.0)
MCH: 24 pg — ABNORMAL LOW (ref 26.0–34.0)
MCHC: 29.8 g/dL — ABNORMAL LOW (ref 30.0–36.0)
MCV: 80.6 fL (ref 80.0–100.0)
Monocytes Absolute: 0.6 10*3/uL (ref 0.1–1.0)
Monocytes Relative: 14 %
Neutro Abs: 2.9 10*3/uL (ref 1.7–7.7)
Neutrophils Relative %: 68 %
Platelets: 141 10*3/uL — ABNORMAL LOW (ref 150–400)
RBC: 3.71 MIL/uL — ABNORMAL LOW (ref 3.87–5.11)
RDW: 21.8 % — ABNORMAL HIGH (ref 11.5–15.5)
Smear Review: DECREASED
WBC: 4.3 10*3/uL (ref 4.0–10.5)
nRBC: 0.7 % — ABNORMAL HIGH (ref 0.0–0.2)

## 2023-04-17 LAB — FERRITIN: Ferritin: 25 ng/mL (ref 11–307)

## 2023-04-17 LAB — IRON AND TIBC
Iron: 53 ug/dL (ref 28–170)
Saturation Ratios: 12 % (ref 10.4–31.8)
TIBC: 438 ug/dL (ref 250–450)
UIBC: 385 ug/dL

## 2023-04-18 ENCOUNTER — Inpatient Hospital Stay (HOSPITAL_BASED_OUTPATIENT_CLINIC_OR_DEPARTMENT_OTHER): Payer: HMO | Admitting: Oncology

## 2023-04-18 ENCOUNTER — Encounter: Payer: Self-pay | Admitting: Oncology

## 2023-04-18 ENCOUNTER — Inpatient Hospital Stay: Payer: HMO

## 2023-04-18 VITALS — BP 155/81 | HR 99 | Temp 98.5°F | Resp 18 | Ht 67.0 in | Wt 162.0 lb

## 2023-04-18 DIAGNOSIS — D509 Iron deficiency anemia, unspecified: Secondary | ICD-10-CM

## 2023-04-18 NOTE — Progress Notes (Signed)
 [facility]  [phone] [phone]  ID: Andrea Bell OB: 1955/04/13  MR#: [id number]  MWN#:[id number]  Patient Care Team: Cleon Dew, FNP as PCP - General (Family Medicine) Mertie Moores, MD as Referring Physician (Pulmonary Disease) Toney Reil, MD as Consulting Physician (Gastroenterology) Jeralyn Ruths, MD as Consulting Physician (Oncology)  CHIEF COMPLAINT: Iron deficiency anemia.  INTERVAL HISTORY: Patient returns to clinic today for repeat laboratory, further evaluation, consideration of additional IV iron.  She currently feels well and is asymptomatic.  She does not complain of any weakness or fatigue today. She has no neurologic complaints.  She denies any recent fevers or illnesses.  She has a good appetite and denies weight loss.  She has no chest pain, shortness of breath, cough, or hemoptysis.  She denies any nausea, vomiting, constipation, or diarrhea.  She has no melena or hematochezia.  She has no urinary complaints.  Patient offers no specific complaints today.  REVIEW OF SYSTEMS:   Review of Systems  Constitutional: Negative.  Negative for fever, malaise/fatigue and weight loss.  Respiratory: Negative.  Negative for cough, hemoptysis and shortness of breath.   Cardiovascular: Negative.  Negative for chest pain and leg swelling.  Gastrointestinal: Negative.  Negative for abdominal pain, blood in stool and melena.  Genitourinary: Negative.  Negative for hematuria.  Musculoskeletal: Negative.  Negative for back pain.  Skin: Negative.  Negative for rash.  Neurological: Negative.  Negative for dizziness, focal weakness, weakness and headaches.  Psychiatric/Behavioral: Negative.      As per HPI. Otherwise, a complete review of systems is negative.  PAST MEDICAL HISTORY: Past Medical History:  Diagnosis Date   Allergy    Anemia    Chronic back pain    Complication of anesthesia    slow to wake   COPD  (chronic obstructive pulmonary disease) (HCC)    2l home oxygen   Dyspnea    GERD (gastroesophageal reflux disease)    Headache    HTN (hypertension)    Hyperlipidemia    Insomnia    Insomnia    Osteoarthritis    Osteoporosis    Oxygen deficiency    Tachycardia    Vertigo    none recently   Wears dentures    partial lower    PAST SURGICAL HISTORY: Past Surgical History:  Procedure Laterality Date   ABDOMINAL HYSTERECTOMY     BIOPSY  12/31/2022   Procedure: BIOPSY;  Surgeon: Toney Reil, MD;  Location: [facility] ENDOS[facility];  Service: Gastroenterology;;   COLONOSCOPY WITH PROPOFOL N/A 12/31/2022   Procedure: COLONOSCOPY WITH PROPOFOL;  Surgeon: Toney Reil, MD;  Location: [facility];  Service: Gastroenterology;  Laterality: N/A;   CYST REMOVAL TRUNK     CYSTECTOMY     back and side of head   CYSTOSCOPY N/A 12/13/2015   Procedure: CYSTOSCOPY;  Surgeon: Vena Austria, MD;  Location: [facility] ORS;  Service: Gynecology;  Laterality: N/A;   ESOPHAGOGASTRODUODENOSCOPY (EGD) WITH PROPOFOL N/A 12/31/2022   Procedure: ESOPHAGOGASTRODUODENOSCOPY (EGD) WITH PROPOFOL;  Surgeon: Toney Reil, MD;  Location: [facility] ENDOSCOPY;  Service: Gastroenterology;  Laterality: N/A;   HOT HEMOSTASIS  12/31/2022   Procedure: HOT HEMOSTASIS (ARGON PLASMA COAGULATION/BICAP);  Surgeon: Toney Reil, MD;  Location: [facility] ENDOSCOPY;  Service: Gastroenterology;;   LAPAROSCOPIC BILATERAL SALPINGO OOPHERECTOMY Bilateral 12/13/2015   Procedure: LAPAROSCOPIC BILATERAL SALPINGO OOPHORECTOMY;  Surgeon: Vena Austria, MD;  Location: [facility] ORS;  Service: Gynecology;  Laterality: Bilateral;   LAPAROSCOPIC HYSTERECTOMY N/A 12/13/2015  Procedure: HYSTERECTOMY TOTAL LAPAROSCOPIC;  Surgeon: Vena Austria, MD;  Location: [facility] OR[facility];  Service: Gynecology;  Laterality: N/A;   tear duct surgery     bilateral   TUBAL LIGATION      FAMILY HISTORY: Family History  Problem Relation Age of Onset    Hypertension Mother    Diabetes Mellitus II Mother    Dementia Mother    Diabetes Mother    Cancer - Other Father        brain tumor   Cancer Father        brain tumor   Breast cancer Maternal Aunt 59   Cancer Maternal Aunt        ovarian   Breast cancer Maternal Grandmother 19   Cancer Maternal Grandmother        breast   Diabetes Brother    Hypertension Brother    COPD Brother    Cancer Maternal Grandfather        stomach   Heart disease Paternal Grandmother    Stroke Paternal Grandfather     ADVANCED DIRECTIVES (Y/N):  N  HEALTH MAINTENANCE: Social History   Tobacco Use   Smoking status: Former    Current packs/day: 0.00    Average packs/day: 0.5 packs/day for 15.0 years (7.5 ttl pk-yrs)    Types: Cigarettes    Start date: 04/24/1996    Quit date: 04/25/2011    Years since quitting: 11.9   Smokeless tobacco: Never  Vaping Use   Vaping status: Never Used  Substance Use Topics   Alcohol use: Yes    Alcohol/week: 10.0 standard drinks of alcohol    Types: 10 Shots of liquor per week    Comment: 1-2 drinks daily   Drug use: No     Colonoscopy:  PAP:  Bone density:  Lipid panel:  Allergies  Allergen Reactions   Codeine Nausea And Vomiting    Was used while in hospital 02/24/14 and no reaction noted--01/06/15 [facility]    Current Outpatient Medications  Medication Sig Dispense Refill   albuterol (PROVENTIL) (2.5 MG/3ML) 0.083% nebulizer solution Take 3 mLs (2.5 mg total) by nebulization every 4 (four) hours as needed for wheezing or shortness of breath. 150 mL 3   albuterol (VENTOLIN HFA) 108 (90 Base) MCG/ACT inhaler Inhale 2 puffs into the lungs every 6 (six) hours as needed for wheezing or shortness of breath. 1 each 3   amLODipine (NORVASC) 10 MG tablet Take 1 tablet by mouth daily.     aspirin EC 81 MG tablet Take 81 mg by mouth daily.     atorvastatin (LIPITOR) 40 MG tablet Take 1 tablet by mouth daily.     bisacodyl (DULCOLAX) 5 MG EC tablet Take 5 mg by mouth  daily as needed for moderate constipation.     Blood Pressure Monitoring (BLOOD PRESSURE CUFF) MISC 1 each by Does not apply route daily. 1 each 0   cyclopentolate (CYCLODRYL,CYCLOGYL) 1 % ophthalmic solution Place 1 drop into both eyes.     Ferrous Sulfate (IRON) 325 (65 Fe) MG TABS Take 1 tablet (325 mg total) by mouth daily. 30 tablet 2   Fluticasone-Umeclidin-Vilant (TRELEGY ELLIPTA) 100-62.5-25 MCG/INH AEPB Inhale 1 puff into the lungs daily. 1 each 11   meclizine (ANTIVERT) 25 MG tablet Take 1 tablet (25 mg total) by mouth 3 (three) times daily as needed for dizziness. 30 tablet 2   meloxicam (MOBIC) 15 MG tablet Take 1 tablet (15 mg total) by mouth daily. 30 tablet 0  metoprolol succinate (TOPROL-XL) 50 MG 24 hr tablet Take 50 mg by mouth daily.     OXYGEN Inhale 2 L into the lungs as needed.      potassium chloride (KLOR-CON) 10 MEQ tablet Take 10 mEq by mouth daily.     traZODone (DESYREL) 150 MG tablet Take 150 mg by mouth at bedtime.     docusate sodium (COLACE) 100 MG capsule Take 100 mg by mouth 2 (two) times daily as needed for mild constipation. (Patient not taking: Reported on 04/18/2023)     omeprazole (PRILOSEC) 40 MG capsule Take 1 capsule (40 mg total) by mouth daily before breakfast. (Patient not taking: Reported on 04/18/2023) 30 capsule 3   No current facility-administered medications for this visit.    OBJECTIVE: Vitals:   04/18/23 1344  BP: (!) 155/81  Pulse: 99  Resp: 18  Temp: 98.5 F (36.9 C)  SpO2: 98%     Body mass index is 25.37 kg/m.    ECOG FS:0 - Asymptomatic  General: Well-developed, well-nourished, no acute distress. Eyes: Pink conjunctiva, anicteric sclera. HEENT: Normocephalic, moist mucous membranes. Lungs: No audible wheezing or coughing. Heart: Regular rate and rhythm. Abdomen: Soft, nontender, no obvious distention. Musculoskeletal: No edema, cyanosis, or clubbing. Neuro: Alert, answering all questions appropriately. Cranial nerves grossly  intact. Skin: No rashes or petechiae noted. Psych: Normal affect.  LAB RESULTS:  Lab Results  Component Value Date   NA 141 11/27/2022   K 2.9 (L) 11/27/2022   CL 110 11/27/2022   CO2 22 11/27/2022   GLUCOSE 134 (H) 11/27/2022   BUN 13 11/27/2022   CREATININE 1.39 (H) 11/27/2022   CALCIUM 10.0 11/27/2022   PROT 6.7 11/26/2022   ALBUMIN 3.6 (L) 11/26/2022   AST 32 11/26/2022   ALT 27 11/26/2022   ALKPHOS 161 (H) 11/26/2022   BILITOT 1.0 11/26/2022   GFRNONAA 42 (L) 11/27/2022   GFRAA 94 04/29/2019    Lab Results  Component Value Date   WBC 4.3 04/17/2023   NEUTROABS 2.9 04/17/2023   HGB 8.9 (L) 04/17/2023   HCT 29.9 (L) 04/17/2023   MCV 80.6 04/17/2023   PLT 141 (L) 04/17/2023   Lab Results  Component Value Date   IRON 53 04/17/2023   TIBC 438 04/17/2023   IRONPCTSAT 12 04/17/2023   Lab Results  Component Value Date   FERRITIN 25 04/17/2023     STUDIES: No results found.  ASSESSMENT: Iron deficiency anemia.  PLAN:    Iron deficiency anemia: Although patient's hemoglobin remains decreased at 8.9, her iron stores are now within normal limits.  She is also asymptomatic.  She does not require transfusion or IV iron at this time.  Colonoscopy on December 31, 2022 did not reveal any significant pathology.  EGD on the same date cauterized several angiodysplastic lesions that may be the etiology of her blood loss.  No intervention is needed.  Patient last received IV Venofer on January 31, 2023.  Return to clinic in 3 months with repeat laboratory, further evaluation, and consideration of additional IV iron.     I spent a total of 20 minutes reviewing chart data, face-to-face evaluation with the patient, counseling and coordination of care as detailed above.   Patient expressed understanding and was in agreement with this plan. She also understands that She can call clinic at any time with any questions, concerns, or complaints.    Jeralyn Ruths, MD    04/18/2023 2:09 PM

## 2023-04-30 ENCOUNTER — Other Ambulatory Visit: Payer: Self-pay | Admitting: Specialist

## 2023-04-30 DIAGNOSIS — Z87891 Personal history of nicotine dependence: Secondary | ICD-10-CM

## 2023-05-05 ENCOUNTER — Emergency Department

## 2023-05-05 ENCOUNTER — Emergency Department
Admission: EM | Admit: 2023-05-05 | Discharge: 2023-05-05 | Disposition: A | Discharge status: Home / Self Care | Attending: Emergency Medicine | Admitting: Emergency Medicine

## 2023-05-05 ENCOUNTER — Other Ambulatory Visit: Payer: Self-pay

## 2023-05-05 DIAGNOSIS — R569 Unspecified convulsions: Secondary | ICD-10-CM | POA: Insufficient documentation

## 2023-05-05 DIAGNOSIS — E876 Hypokalemia: Secondary | ICD-10-CM | POA: Insufficient documentation

## 2023-05-05 DIAGNOSIS — F1090 Alcohol use, unspecified, uncomplicated: Secondary | ICD-10-CM

## 2023-05-05 DIAGNOSIS — Z7982 Long term (current) use of aspirin: Secondary | ICD-10-CM | POA: Diagnosis not present

## 2023-05-05 DIAGNOSIS — I129 Hypertensive chronic kidney disease with stage 1 through stage 4 chronic kidney disease, or unspecified chronic kidney disease: Secondary | ICD-10-CM | POA: Insufficient documentation

## 2023-05-05 DIAGNOSIS — S065X0A Traumatic subdural hemorrhage without loss of consciousness, initial encounter: Secondary | ICD-10-CM | POA: Diagnosis not present

## 2023-05-05 DIAGNOSIS — N189 Chronic kidney disease, unspecified: Secondary | ICD-10-CM | POA: Diagnosis not present

## 2023-05-05 DIAGNOSIS — S065XAA Traumatic subdural hemorrhage with loss of consciousness status unknown, initial encounter: Secondary | ICD-10-CM | POA: Diagnosis not present

## 2023-05-05 DIAGNOSIS — J449 Chronic obstructive pulmonary disease, unspecified: Secondary | ICD-10-CM | POA: Diagnosis not present

## 2023-05-05 DIAGNOSIS — W19XXXA Unspecified fall, initial encounter: Secondary | ICD-10-CM | POA: Diagnosis not present

## 2023-05-05 LAB — URINALYSIS, COMPLETE (UACMP) WITH MICROSCOPIC
Bacteria, UA: NONE SEEN
Bilirubin Urine: NEGATIVE
Glucose, UA: NEGATIVE mg/dL
Hgb urine dipstick: NEGATIVE
Ketones, ur: 5 mg/dL — AB
Leukocytes,Ua: NEGATIVE
Nitrite: NEGATIVE
Protein, ur: 30 mg/dL — AB
Specific Gravity, Urine: 1.023 (ref 1.005–1.030)
pH: 5 (ref 5.0–8.0)

## 2023-05-05 LAB — URINE DRUG SCREEN, QUALITATIVE (ARMC ONLY)
Amphetamines, Ur Screen: NOT DETECTED
Barbiturates, Ur Screen: NOT DETECTED
Benzodiazepine, Ur Scrn: POSITIVE — AB
Cannabinoid 50 Ng, Ur ~~LOC~~: NOT DETECTED
Cocaine Metabolite,Ur ~~LOC~~: NOT DETECTED
MDMA (Ecstasy)Ur Screen: NOT DETECTED
Methadone Scn, Ur: NOT DETECTED
Opiate, Ur Screen: NOT DETECTED
Phencyclidine (PCP) Ur S: NOT DETECTED
Tricyclic, Ur Screen: NOT DETECTED

## 2023-05-05 LAB — DIFFERENTIAL
Abs Immature Granulocytes: 0.02 10*3/uL (ref 0.00–0.07)
Basophils Absolute: 0.2 10*3/uL — ABNORMAL HIGH (ref 0.0–0.1)
Basophils Relative: 2 %
Eosinophils Absolute: 0.1 10*3/uL (ref 0.0–0.5)
Eosinophils Relative: 1 %
Immature Granulocytes: 0 %
Lymphocytes Relative: 16 %
Lymphs Abs: 1 10*3/uL (ref 0.7–4.0)
Monocytes Absolute: 0.6 10*3/uL (ref 0.1–1.0)
Monocytes Relative: 10 %
Neutro Abs: 4.6 10*3/uL (ref 1.7–7.7)
Neutrophils Relative %: 71 %
Smear Review: NORMAL

## 2023-05-05 LAB — COMPREHENSIVE METABOLIC PANEL
ALT: 27 U/L (ref 0–44)
AST: 95 U/L — ABNORMAL HIGH (ref 15–41)
Albumin: 4.2 g/dL (ref 3.5–5.0)
Alkaline Phosphatase: 144 U/L — ABNORMAL HIGH (ref 38–126)
Anion gap: 13 (ref 5–15)
BUN: 15 mg/dL (ref 8–23)
CO2: 26 mmol/L (ref 22–32)
Calcium: 9.5 mg/dL (ref 8.9–10.3)
Chloride: 100 mmol/L (ref 98–111)
Creatinine, Ser: 0.69 mg/dL (ref 0.44–1.00)
GFR, Estimated: >60 mL/min (ref >60)
Glucose, Bld: 107 mg/dL — ABNORMAL HIGH (ref 70–99)
Potassium: 3.2 mmol/L — ABNORMAL LOW (ref 3.5–5.1)
Sodium: 139 mmol/L (ref 135–145)
Total Bilirubin: 1.9 mg/dL — ABNORMAL HIGH (ref 0.0–1.2)
Total Protein: 8.3 g/dL — ABNORMAL HIGH (ref 6.5–8.1)

## 2023-05-05 LAB — CBC
HCT: 34.5 % — ABNORMAL LOW (ref 36.0–46.0)
Hemoglobin: 10.5 g/dL — ABNORMAL LOW (ref 12.0–15.0)
MCH: 24.9 pg — ABNORMAL LOW (ref 26.0–34.0)
MCHC: 30.4 g/dL (ref 30.0–36.0)
MCV: 81.9 fL (ref 80.0–100.0)
Platelets: 215 10*3/uL (ref 150–400)
RBC: 4.21 MIL/uL (ref 3.87–5.11)
RDW: 21.8 % — ABNORMAL HIGH (ref 11.5–15.5)
WBC: 6.5 10*3/uL (ref 4.0–10.5)
nRBC: 0 % (ref 0.0–0.2)

## 2023-05-05 LAB — VITAMIN B12: Vitamin B-12: 391 pg/mL (ref 180–914)

## 2023-05-05 LAB — PROTIME-INR
INR: 1.2 (ref 0.8–1.2)
Prothrombin Time: 15.4 s — ABNORMAL HIGH (ref 11.4–15.2)

## 2023-05-05 LAB — CBG MONITORING, ED: Glucose-Capillary: 111 mg/dL — ABNORMAL HIGH (ref 70–99)

## 2023-05-05 LAB — APTT: aPTT: 34 s (ref 24–36)

## 2023-05-05 LAB — ETHANOL: Alcohol, Ethyl (B): 203 mg/dL — ABNORMAL HIGH (ref <10)

## 2023-05-05 LAB — MAGNESIUM: Magnesium: 1.5 mg/dL — ABNORMAL LOW (ref 1.7–2.4)

## 2023-05-05 MED ORDER — LEVETIRACETAM IN NACL 1000 MG/100ML IV SOLN
1000.0000 mg | Freq: Once | INTRAVENOUS | Status: AC
Start: 2023-05-05 — End: 2023-05-05
  Administered 2023-05-05: 1000 mg via INTRAVENOUS
  Filled 2023-05-05: qty 100

## 2023-05-05 MED ORDER — SODIUM CHLORIDE 0.9 % IV BOLUS
500.0000 mL | Freq: Once | INTRAVENOUS | Status: AC
  Administered 2023-05-05: 500 mL via INTRAVENOUS

## 2023-05-05 MED ORDER — LEVETIRACETAM 500 MG PO TABS: 500.0000 mg | ORAL_TABLET | Freq: Two times a day (BID) | ORAL | 0 refills | Status: DC

## 2023-05-05 MED ORDER — LORAZEPAM 2 MG/ML IJ SOLN: 0.0000 mg | Freq: Two times a day (BID) | INTRAMUSCULAR | Status: DC

## 2023-05-05 MED ORDER — LEVETIRACETAM 500 MG PO TABS
500.0000 mg | ORAL_TABLET | Freq: Once | ORAL | Status: AC
  Administered 2023-05-05: 500 mg via ORAL
  Filled 2023-05-05: qty 1

## 2023-05-05 MED ORDER — LORAZEPAM 2 MG PO TABS: 0.0000 mg | ORAL_TABLET | Freq: Two times a day (BID) | ORAL | Status: DC

## 2023-05-05 MED ORDER — LEVETIRACETAM IN NACL 500 MG/100ML IV SOLN
500.0000 mg | Freq: Two times a day (BID) | INTRAVENOUS | Status: DC
  Filled 2023-05-05 (×2): qty 100

## 2023-05-05 MED ORDER — THIAMINE HCL 100 MG/ML IJ SOLN
100.0000 mg | Freq: Every day | INTRAMUSCULAR | Status: DC
  Administered 2023-05-05: 100 mg via INTRAVENOUS
  Filled 2023-05-05: qty 2

## 2023-05-05 MED ORDER — THIAMINE HCL 100 MG/ML IJ SOLN: 100.0000 mg | Freq: Every day | INTRAMUSCULAR | Status: DC

## 2023-05-05 MED ORDER — LORAZEPAM 2 MG PO TABS: 0.0000 mg | ORAL_TABLET | Freq: Four times a day (QID) | ORAL | Status: DC

## 2023-05-05 MED ORDER — THIAMINE MONONITRATE 100 MG PO TABS: 100.0000 mg | ORAL_TABLET | Freq: Every day | ORAL | Status: DC

## 2023-05-05 MED ORDER — LORAZEPAM 2 MG/ML IJ SOLN: 0.0000 mg | Freq: Four times a day (QID) | INTRAMUSCULAR | Status: DC

## 2023-05-05 MED ORDER — SODIUM CHLORIDE 0.9% FLUSH
3.0000 mL | Freq: Once | INTRAVENOUS | Status: AC
  Administered 2023-05-05: 3 mL via INTRAVENOUS

## 2023-05-05 NOTE — ED Notes (Signed)
 This RN in room to dc patient. Pt HR noted to be 130s. Family reports they helped pt to bedside toilet and when she got back into bed she "got hot". Family seen with cool rag on pt's forehead. While RN in room, pt's HR drops back down to 110s. Stafford MD made aware and to order NS and reassess HR after. Pt also provided sandwich tray and drink per request

## 2023-05-05 NOTE — Progress Notes (Addendum)
 1340 elert for patient there by EMS 1341 page to neuro, she is already assessing (Dr. Iver Nestle). 1344 down to scan 1354 back from scan  1401 neuro aware of read. Nothing needed further from telestroke. Mrs 0

## 2023-05-05 NOTE — Progress Notes (Signed)
 CODE STROKE- PHARMACY COMMUNICATION  Time CODE STROKE called/page received:3/16 @ 1231  Time response to CODE STROKE was made (in person or via phone): in-person  Time Stroke Kit retrieved from Pyxis (only if needed): No intervention required  Name of Provider/Nurse contacted: Dr. Iver Nestle   Past Medical History:  Diagnosis Date   Allergy    Anemia    Chronic back pain    Complication of anesthesia    slow to wake   COPD (chronic obstructive pulmonary disease) (HCC)    2l home oxygen   Dyspnea    GERD (gastroesophageal reflux disease)    Headache    HTN (hypertension)    Hyperlipidemia    Insomnia    Insomnia    Osteoarthritis    Osteoporosis    Oxygen deficiency    Tachycardia    Vertigo    none recently   Wears dentures    partial lower   Prior to Admission medications   Medication Sig Start Date End Date Taking? Authorizing Provider  albuterol (PROVENTIL) (2.5 MG/3ML) 0.083% nebulizer solution Take 3 mLs (2.5 mg total) by nebulization every 4 (four) hours as needed for wheezing or shortness of breath. 04/18/18   Lada, Janit Bern, MD  albuterol (VENTOLIN HFA) 108 (90 Base) MCG/ACT inhaler Inhale 2 puffs into the lungs every 6 (six) hours as needed for wheezing or shortness of breath. 04/26/20   Caro Laroche, DO  amLODipine (NORVASC) 10 MG tablet Take 1 tablet by mouth daily. 05/17/22   [provider]  aspirin EC 81 MG tablet Take 81 mg by mouth daily.    [provider]  atorvastatin (LIPITOR) 40 MG tablet Take 1 tablet by mouth daily. 05/02/22   [provider]  bisacodyl (DULCOLAX) 5 MG EC tablet Take 5 mg by mouth daily as needed for moderate constipation.    [provider]  Blood Pressure Monitoring (BLOOD PRESSURE CUFF) MISC 1 each by Does not apply route daily. 04/26/20   Caro Laroche, DO  cyclopentolate (CYCLODRYL,CYCLOGYL) 1 % ophthalmic solution Place 1 drop into both eyes. 06/14/21   [provider]  docusate  sodium (COLACE) 100 MG capsule Take 100 mg by mouth 2 (two) times daily as needed for mild constipation. Patient not taking: Reported on 04/18/2023    [provider]  Ferrous Sulfate (IRON) 325 (65 Fe) MG TABS Take 1 tablet (325 mg total) by mouth daily. 11/27/22   Phineas Semen, MD  Fluticasone-Umeclidin-Vilant (TRELEGY ELLIPTA) 100-62.5-25 MCG/INH AEPB Inhale 1 puff into the lungs daily. 04/26/20   Caro Laroche, DO  meclizine (ANTIVERT) 25 MG tablet Take 1 tablet (25 mg total) by mouth 3 (three) times daily as needed for dizziness. 05/09/17   Kerman Passey, MD  meloxicam (MOBIC) 15 MG tablet Take 1 tablet (15 mg total) by mouth daily. 07/15/20   Cuthriell, Delorise Royals, PA-C  metoprolol succinate (TOPROL-XL) 50 MG 24 hr tablet Take 50 mg by mouth daily. 09/17/22 09/17/23  [provider]  omeprazole (PRILOSEC) 40 MG capsule Take 1 capsule (40 mg total) by mouth daily before breakfast. Patient not taking: Reported on 04/18/2023 12/31/22 04/30/23  Toney Reil, MD  OXYGEN Inhale 2 L into the lungs as needed.     [provider]  potassium chloride (KLOR-CON) 10 MEQ tablet Take 10 mEq by mouth daily.    [provider]  traZODone (DESYREL) 150 MG tablet Take 150 mg by mouth at bedtime. 09/17/22   [provider]   Effie Shy, PharmD Pharmacy Resident  05/05/2023 3:04 PM

## 2023-05-05 NOTE — Progress Notes (Signed)
   05/05/23 1330  Spiritual Encounters  Type of Visit Initial  Care provided to: Pt and family  Conversation partners present during encounter Nurse;Physician  Referral source Code page  Reason for visit Code  OnCall Visit Yes  Interventions  Spiritual Care Interventions Made Compassionate presence  Intervention Outcomes  Outcomes Connection to spiritual care  Spiritual Care Plan  Spiritual Care Issues Still Outstanding No further spiritual care needs at this time (see row info)

## 2023-05-05 NOTE — Progress Notes (Signed)
 Initial chart review demonstrates an acute subdural hematoma measuring approximately 1 cm, a chronic appearing hematoma on the contralateral side.   She presented with seizure like episode, was given keppra in the emergency department.  We'll follow up on anticoagulation laboratories, her platelets are within normal limits, she takes an aspirin 81   Recommend a six hour repeat head CT.   Will follow up with a repeat imaging

## 2023-05-05 NOTE — Discharge Instructions (Addendum)
 We found you were having a seizure because you had some bleeding in the brain, likely due to your recent fall.  You were started on Keppra to prevent further seizures.  Please do not miss any doses of this medication  Please keep track of any other spells you have, including episodes of right sided twitching, difficulty talking, shaking spells, loss of consciousness events, staring spells etc to review with your neurologist   Spell log:  - Date and time of event: - Description of event:  - Prodome (any warning / premonition event is going to happen): - Post-spell symptoms: - Any potential triggers:   Standard seizure precautions: Per [employer] statutes, patients with seizures are not allowed to drive until  they have been seizure-free for six months. Use caution when using heavy equipment or power tools. Avoid working on ladders or at heights. Take showers instead of baths. Ensure the water temperature is not too high on the home water heater. Do not go swimming alone. When caring for infants or small children, sit down when holding, feeding, or changing them to minimize risk of injury to the child in the event you have a seizure.  To reduce risk of seizures, maintain good sleep hygiene avoid alcohol and illicit drug use, take all anti-seizure medications as prescribed.

## 2023-05-05 NOTE — ED Notes (Signed)
 Pt VS better, able to transfer from stretcher to wheelchair with some assistance but does well. Per stafford MD ok for dc at this time

## 2023-05-05 NOTE — ED Triage Notes (Signed)
 Pt brought in by EMS as a Code Stroke. Per spouse, last known normal was between 10 and 1030am. Between those times, pt began having a right-sided facial twitch & became non-verbal. EMS states upon their arrival, pt was experiencing right-sided limb weakness and right-sided facial droop along with facial twitch and aphasia. 2.5mg  IM versed administered PTA.

## 2023-05-05 NOTE — ED Provider Notes (Signed)
 Procedures  Clinical Course as of 05/05/23 2101  Sun May 05, 2023  1404 I consult with Neurology at patient's bedside [DS]  1407 I consult with Dr. Ernestine Mcmurray, NSGY, 6hr repeat scan [DS]  1421 Reassessed [DS]  1427 I called her husband, Rylinn Linzy.no response [DS]  1441 Reassessed.  Her sister and patient's son is in the room.  Husband is on the way.  Discussed what we have found, subdural hematoma, irritation of the brain and seizure activity, plan of care.  Answered other questions.  They are agreeable [DS]    Clinical Course User Index [DS] Delton Prairie, MD    ----------------------------------------- 9:01 PM on 05/05/2023 -----------------------------------------  Patient reassessed, no new symptoms.  Alert.  Repeat CT head is stable without any enlarging bleed.  Stable for discharge as previously planned.  Will give an additional dose of Keppra orally before discharge since she will be able to pick up her prescription until tomorrow morning.    Sharman Cheek, MD 05/05/23 2102

## 2023-05-05 NOTE — ED Provider Notes (Signed)
 [facility] Provider Note    Event Date/Time   First MD Initiated Contact with Patient 05/05/23 1347     (approximate)   History   No chief complaint on file.   HPI  Andrea Bell is a 68 y.o. female who presents to the ED for evaluation of No chief complaint on file.   Review of pulmonary clinic visit from 6 days ago.  History of severe COPD, ex-smoker, GERD, CKD, HTN.  Aspirin 81 is on the blood thinner Microcytic anemia  Patient presents to the ED from home via EMS for evaluation of facial twitching and seizure-like activity on the right side of her body just this morning.  Presents as a Animal nutritionist code stroke.  EMS provides Versed and patient's seizure-like activity abates prior to arrival.   She woke up this morning in her typical state of health and began drinking ethanol with her husband as it is their anniversary, while seated developed facial twitching on the right of her face and her body around 10 AM.  Physical Exam   Triage Vital Signs: ED Triage Vitals  Encounter Vitals Group     BP      Systolic BP Percentile      Diastolic BP Percentile      Pulse      Resp      Temp      Temp src      SpO2      Weight      Height      Head Circumference      Peak Flow      Pain Score      Pain Loc      Pain Education      Exclude from Growth Chart     Most recent vital signs: Vitals:   05/05/23 1359  BP: (!) 159/75  Pulse: (!) 107  Resp: 14  Temp: 98.7 F (37.1 C)  SpO2: 95%    General: Awake, no distress.  Slurred speech, follows basic commands in all 4.  Difficult to understand her speech CV:  Good peripheral perfusion.  Resp:  Normal effort.  Abd:  No distention.  MSK:  No deformity noted.  Neuro:  Slight right-sided facial droop, dysarthria.  Sensation intact bilaterally.  Some difficulty with more complicated tasks such as heel-to-shin Other:     ED Results / Procedures / Treatments   Labs (all labs ordered are listed,  but only abnormal results are displayed) Labs Reviewed  PROTIME-INR - Abnormal; Notable for the following components:      Result Value   Prothrombin Time 15.4 (*)    All other components within normal limits  CBC - Abnormal; Notable for the following components:   Hemoglobin 10.5 (*)    HCT 34.5 (*)    MCH 24.9 (*)    RDW 21.8 (*)    All other components within normal limits  DIFFERENTIAL - Abnormal; Notable for the following components:   Basophils Absolute 0.2 (*)    All other components within normal limits  COMPREHENSIVE METABOLIC PANEL - Abnormal; Notable for the following components:   Potassium 3.2 (*)    Glucose, Bld 107 (*)    Total Protein 8.3 (*)    AST 95 (*)    Alkaline Phosphatase 144 (*)    Total Bilirubin 1.9 (*)    All other components within normal limits  ETHANOL - Abnormal; Notable for the following components:   Alcohol, Ethyl (B) 203 (*)  All other components within normal limits  CBG MONITORING, ED - Abnormal; Notable for the following components:   Glucose-Capillary 111 (*)    All other components within normal limits  APTT  URINALYSIS, COMPLETE (UACMP) WITH MICROSCOPIC  URINE DRUG SCREEN, QUALITATIVE ([facility] ONLY)  MAGNESIUM  VITAMIN B12  VITAMIN B1  I-STAT CREATININE, ED    EKG Sinus rhythm with a rate of 101 bpm.  Leftward axis.  Right bundle.  No STEMI.  RADIOLOGY CT head interpreted by me with acute SDH on the left without midline shift  Official radiology report(s): CT HEAD CODE STROKE WO CONTRAST Result Date: 05/05/2023 CLINICAL DATA:  Code stroke. Neuro deficit, acute, stroke suspected. EXAM: CT HEAD WITHOUT CONTRAST TECHNIQUE: Contiguous axial images were obtained from the base of the skull through the vertex without intravenous contrast. RADIATION DOSE REDUCTION: This exam was performed according to the departmental dose-optimization program which includes automated exposure control, adjustment of the mA and/or kV according to patient  size and/or use of iterative reconstruction technique. COMPARISON:  Head CT 04/19/2008. FINDINGS: Brain: Generalized cerebral atrophy. Mixed density subdural hematoma along the left cerebral convexity, containing acute components and measuring up to 10 mm in thickness (for instance as seen on series 6, image 24). 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 Mild mass effect upon the underlying cerebral hemispheres. No midline shift. No demarcated cortical infarct. No evidence of an intracranial mass. Vascular: No hyperdense vessel.  Atherosclerotic calcifications. Skull: No calvarial fracture or aggressive osseous lesion. Sinuses/Orbits: No mass or acute finding within the imaged orbits. No significant paranasal sinus disease at the imaged levels. ASPECTS (Alberta Stroke Program Early CT Score) - Ganglionic level infarction (caudate, lentiform nuclei, internal capsule, insula, M1-M3 cortex): 7 - Supraganglionic infarction (M4-M6 cortex): 3 Total score (0-10 with 10 being normal): 10 These results were called by telephone at the time of interpretation on 05/05/2023 at 2:02 pm to provider [facility] , who verbally acknowledged these results. IMPRESSION: 1. Mixed density subdural hematoma along the left cerebral convexity, containing acute components and measuring up to 11 mm in thickness. 2. 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 3. Mild mass effect upon the underlying cerebral hemispheres. No midline shift. 4. No evidence of an acute infarct. 5. Cerebral atrophy. Electronically Signed   By: Jackey Loge D.O.   On: 05/05/2023 14:02    PROCEDURES and INTERVENTIONS:  .Critical Care  Performed by: Delton Prairie, MD Authorized by: Delton Prairie, MD   Critical care provider statement:    Critical  care time (minutes):  30   Critical care time was exclusive of:  Separately billable procedures and treating other patients   Critical care was necessary to treat or prevent imminent or life-threatening deterioration of the following conditions:  CNS failure or compromise   Critical care was time spent personally by me on the following activities:  Development of treatment plan with patient or surrogate, discussions with consultants, evaluation of patient's response to treatment, examination of patient, ordering and review of laboratory studies, ordering and review of radiographic studies, ordering and performing treatments and interventions, pulse oximetry, re-evaluation of patient's condition and review of old charts .1-3 Lead EKG Interpretation  Performed by: Delton Prairie, MD Authorized by: Delton Prairie, MD     Interpretation: abnormal     ECG rate:  104  ECG rate assessment: tachycardic     Rhythm: sinus tachycardia     Ectopy: none     Conduction: normal     Medications  levETIRAcetam (KEPPRA) IVPB 500 mg/100 mL premix (has no administration in time range)  thiamine (VITAMIN B1) injection 100 mg (has no administration in time range)  sodium chloride flush (NS) 0.9 % injection 3 mL (3 mLs Intravenous Given 05/05/23 1406)  levETIRAcetam (KEPPRA) IVPB 1000 mg/100 mL premix (1,000 mg Intravenous New Bag/Given 05/05/23 1403)     IMPRESSION / MDM / ASSESSMENT AND PLAN / ED COURSE  I reviewed the triage vital signs and the nursing notes.  Differential diagnosis includes, but is not limited to, ischemic stroke, hemorrhagic stroke, postictal, seizure, metabolic encephalopathy  {Patient presents with symptoms of an acute illness or injury that is potentially life-threatening.  Patient presents to the ED from home as a field stroke alert with evidence of focal right-sided seizures in the setting of a likely traumatic SDH.  She is on aspirin 81.  Reportedly had a fall 1 or 2 weeks ago.  Woke  up this morning at her baseline and had a witnessed focal seizure activity that has not yet recurred.  Versed with EMS and loading with Keppra here in the ED.  Consult with neurology and neurosurgery, see their recommendations.  At least 6-hour repeat CT head and antiepileptics  Mild hypokalemia, no leukocytosis.  Signed out to oncoming physician to facilitate this plan  Clinical Course as of 05/05/23 1451  Sun May 05, 2023  1404 I consult with Neurology at patient's bedside [DS]  1407 I consult with Dr. Ernestine Mcmurray, NSGY, 6hr repeat scan [DS]  1421 Reassessed [DS]  1427 I called her husband, Andrea Bell.no response [DS]  1441 Reassessed.  Her sister and patient's son is in the room.  Husband is on the way.  Discussed what we have found, subdural hematoma, irritation of the brain and seizure activity, plan of care.  Answered other questions.  They are agreeable [DS]    Clinical Course User Index [DS] Delton Prairie, MD     FINAL CLINICAL IMPRESSION(S) / ED DIAGNOSES   Final diagnoses:  Seizure (HCC)  SDH (subdural hematoma) (HCC)     Rx / DC Orders   ED Discharge Orders     None        Note:  This document was prepared using Dragon voice recognition software and may include unintentional dictation errors.   Delton Prairie, MD 05/05/23 [id number]

## 2023-05-05 NOTE — Consult Note (Signed)
 NEUROLOGY CONSULT NOTE   Date of service: May 05, 2023 Patient Name: Andrea Bell MRN:  [id number] DOB:  June 04, 1955 Chief Complaint: "difficulty speaking" Requesting Provider: Delton Prairie, MD  History of Present Illness  Andrea Bell is a 68 y.o. 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  Subsequently she started to have right facial twitching and right arm and leg twitching starting at about 10 AM per EMS report.  On EMS arrival they gave her 5 mg of Versed with resolution of the twitching symptoms.  They noted no gaze deviation.  She maintained alertness.  She has been gradually been improving during transport, now able to follow simple commands at time of arrival to ED  She did have a fall 2 to 3 weeks ago  LKW: 10 AM IV Thrombolysis: No, SDH EVT: No, SDH    NIHSS components Score: Comment  1a Level of Conscious 0[x]  1[]  2[]  3[]      1b LOC Questions 0[]  1[]  2[x]       1c LOC Commands 0[x]  1[]  2[]       2 Best Gaze 0[x]  1[]  2[]       3 Visual 0[x]  1[]  2[]  3[]      4 Facial Palsy 0[]  1[x]  2[]  3[]      5a Motor Arm - left 0[]  1[x]  2[]  3[]  4[]  UN[]    5b Motor Arm - Right 0[]  1[x]  2[]  3[]  4[]  UN[]    6a Motor Leg - Left 0[]  1[x]  2[]  3[]  4[]  UN[]    6b Motor Leg - Right 0[]  1[x]  2[]  3[]  4[]  UN[]    7 Limb Ataxia 0[x]  1[]  2[]  3[]  UN[]     8 Sensory 0[x]  1[]  2[]  UN[]      9 Best Language 0[]  1[]  2[x]  3[]      10 Dysarthria 0[]  1[]  2[x]  UN[]      11 Extinct. and Inattention 0[x]  1[]  2[]       TOTAL: 11       ROS  Limited due to aphasia  Past History   Past Medical History:  Diagnosis Date   Allergy    Anemia    Chronic back pain    Complication of anesthesia    slow to  wake   COPD (chronic obstructive pulmonary disease) (HCC)    2l home oxygen   Dyspnea    GERD (gastroesophageal reflux disease)    Headache    HTN (hypertension)    Hyperlipidemia    Insomnia    Insomnia    Osteoarthritis    Osteoporosis    Oxygen deficiency    Tachycardia    Vertigo    none recently   Wears dentures    partial lower    Past Surgical History:  Procedure Laterality Date   ABDOMINAL HYSTERECTOMY     BIOPSY  12/31/2022   Procedure: BIOPSY;  Surgeon: Toney Reil, MD;  Location: [facility] ENDOSCOPY;  Service: Gastroenterology;;   COLONOSCOPY WITH PROPOFOL N/A 12/31/2022   Procedure: COLONOSCOPY WITH PROPOFOL;  Surgeon: Toney Reil, MD;  Location: [facility] ENDOSCOPY;  Service: Gastroenterology;  Laterality: N/A;   CYST REMOVAL TRUNK     CYSTECTOMY     back and side of head   CYSTOSCOPY N/A 12/13/2015   Procedure: CYSTOSCOPY;  Surgeon: Carmel Sacramento  Bonney Aid, MD;  Location: [facility] ORS;  Service: Gynecology;  Laterality: N/A;   ESOPHAGOGASTRODUODENOSCOPY (EGD) WITH PROPOFOL N/A 12/31/2022   Procedure: ESOPHAGOGASTRODUODENOSCOPY (EGD) WITH PROPOFOL;  Surgeon: Toney Reil, MD;  Location: [facility] ENDOSCOPY;  Service: Gastroenterology;  Laterality: N/A;   HOT HEMOSTASIS  12/31/2022   Procedure: HOT HEMOSTASIS (ARGON PLASMA COAGULATION/BICAP);  Surgeon: Toney Reil, MD;  Location: [facility] ENDOSCOPY;  Service: Gastroenterology;;   LAPAROSCOPIC BILATERAL SALPINGO OOPHERECTOMY Bilateral 12/13/2015   Procedure: LAPAROSCOPIC BILATERAL SALPINGO OOPHORECTOMY;  Surgeon: Vena Austria, MD;  Location: [facility] ORS;  Service: Gynecology;  Laterality: Bilateral;   LAPAROSCOPIC HYSTERECTOMY N/A 12/13/2015   Procedure: HYSTERECTOMY TOTAL LAPAROSCOPIC;  Surgeon: Vena Austria, MD;  Location: [facility] ORS;  Service: Gynecology;  Laterality: N/A;   tear duct surgery     bilateral   TUBAL LIGATION      Family History: Family History  Problem Relation Age of Onset    Hypertension Mother    Diabetes Mellitus II Mother    Dementia Mother    Diabetes Mother    Cancer - Other Father        brain tumor   Cancer Father        brain tumor   Breast cancer Maternal Aunt 3   Cancer Maternal Aunt        ovarian   Breast cancer Maternal Grandmother 44   Cancer Maternal Grandmother        breast   Diabetes Brother    Hypertension Brother    COPD Brother    Cancer Maternal Grandfather        stomach   Heart disease Paternal Grandmother    Stroke Paternal Grandfather     Social History  reports that she quit smoking about 12 years ago. Her smoking use included cigarettes. She started smoking about 27 years ago. She has a 7.5 pack-year smoking history. She has never used smokeless tobacco. She reports current alcohol use of about 10.0 standard drinks of alcohol per week. She reports that she does not use drugs.  Allergies  Allergen Reactions   Codeine Nausea And Vomiting    Was used while in hospital 02/24/14 and no reaction noted--01/06/15 [facility]    Medications   Current Facility-Administered Medications:    levETIRAcetam (KEPPRA) IVPB 1000 mg/100 mL premix, 1,000 mg, Intravenous, Once, Andrea Bell L, MD, Last Rate: 400 mL/hr at 05/05/23 1403, 1,000 mg at 05/05/23 1403  Current Outpatient Medications:    albuterol (PROVENTIL) (2.5 MG/3ML) 0.083% nebulizer solution, Take 3 mLs (2.5 mg total) by nebulization every 4 (four) hours as needed for wheezing or shortness of breath., Disp: 150 mL, Rfl: 3   albuterol (VENTOLIN HFA) 108 (90 Base) MCG/ACT inhaler, Inhale 2 puffs into the lungs every 6 (six) hours as needed for wheezing or shortness of breath., Disp: 1 each, Rfl: 3   amLODipine (NORVASC) 10 MG tablet, Take 1 tablet by mouth daily., Disp: , Rfl:    aspirin EC 81 MG tablet, Take 81 mg by mouth daily., Disp: , Rfl:    atorvastatin (LIPITOR) 40 MG tablet, Take 1 tablet by mouth daily., Disp: , Rfl:    bisacodyl (DULCOLAX) 5 MG EC tablet, Take 5 mg by  mouth daily as needed for moderate constipation., Disp: , Rfl:    Blood Pressure Monitoring (BLOOD PRESSURE CUFF) MISC, 1 each by Does not apply route daily., Disp: 1 each, Rfl: 0   cyclopentolate (CYCLODRYL,CYCLOGYL) 1 % ophthalmic solution, Place 1 drop into both eyes., Disp: , Rfl:  docusate sodium (COLACE) 100 MG capsule, Take 100 mg by mouth 2 (two) times daily as needed for mild constipation. (Patient not taking: Reported on 04/18/2023), Disp: , Rfl:    Ferrous Sulfate (IRON) 325 (65 Fe) MG TABS, Take 1 tablet (325 mg total) by mouth daily., Disp: 30 tablet, Rfl: 2   Fluticasone-Umeclidin-Vilant (TRELEGY ELLIPTA) 100-62.5-25 MCG/INH AEPB, Inhale 1 puff into the lungs daily., Disp: 1 each, Rfl: 11   meclizine (ANTIVERT) 25 MG tablet, Take 1 tablet (25 mg total) by mouth 3 (three) times daily as needed for dizziness., Disp: 30 tablet, Rfl: 2   meloxicam (MOBIC) 15 MG tablet, Take 1 tablet (15 mg total) by mouth daily., Disp: 30 tablet, Rfl: 0   metoprolol succinate (TOPROL-XL) 50 MG 24 hr tablet, Take 50 mg by mouth daily., Disp: , Rfl:    omeprazole (PRILOSEC) 40 MG capsule, Take 1 capsule (40 mg total) by mouth daily before breakfast. (Patient not taking: Reported on 04/18/2023), Disp: 30 capsule, Rfl: 3   OXYGEN, Inhale 2 L into the lungs as needed. , Disp: , Rfl:    potassium chloride (KLOR-CON) 10 MEQ tablet, Take 10 mEq by mouth daily., Disp: , Rfl:    traZODone (DESYREL) 150 MG tablet, Take 150 mg by mouth at bedtime., Disp: , Rfl:   Vitals   Vitals:   May 17, 2023 1359 17-May-2023 1400  BP: (!) 159/75   Pulse: (!) 107   Resp: 14   Temp: 98.7 F (37.1 C)   TempSrc: Oral   SpO2: 95%   Weight:  73.4 kg  Height:  5\' 7"  (1.702 m)    Body mass index is 25.34 kg/m.  Physical Exam   Constitutional: Appears chronically ill Psych: Affect appropriate to situation, confused but cooperative to mimicking.  Eyes: No scleral injection.  HENT: No OP obstruction.  No obvious injury though full  examination was not performed Cardiovascular: Normal rate and regular rhythm.  Respiratory: Effort normal, non-labored breathing.  GI: Soft.  No distension. There is no tenderness.  Skin: Right shoulder hematoma secondary to her fall  Neurologic Examination   Neuro: Mental Status: Patient is awake, alert, unable to answer any orientation questions but able to follow most simple commands.  Initially incomprehensible sounds only.  Later on greater diversity of largely incomprehensible sounds and improved ability to follow simple commands. Cranial Nerves: II: Visual Fields are full to orienting to stimuli. Pupils are equal, round, and reactive to light.   III,IV, VI: EOMI without ptosis or diploplia.  Possible slight right gaze preference but difficult to fully assess due to mental status (not scored on NIH) V: Facial sensation is symmetric to eyelash brush VII: Facial movement is notable for a slight right facial droop VIII: hearing is intact to voice Motor: There is slight drift in all 4 extremities, slightly more on the right side compared to the left but none of her extremities fully hit the bed. Sensory: Grossly equally reactive to light touch throughout Cerebellar: FNF intact bilaterally, unable to complete heel-to-shin due to confusion    Labs/Imaging/Neurodiagnostic studies   CBC:  Recent Labs  Lab 05/17/23 1341  WBC 6.5  HGB 10.5*  HCT 34.5*  MCV 81.9  PLT 215   Basic Metabolic Panel:  Lab Results  Component Value Date   NA 139 17-May-2023   K 3.2 (L) 17-May-2023   CO2 26 05/17/2023   GLUCOSE 107 (H) 2023-05-17   BUN 15 2023-05-17   CREATININE 0.69 2023/05/17   CALCIUM 9.5 05/17/23  GFRNONAA >60 05/05/2023   GFRAA 94 04/29/2019   Lipid Panel:  Lab Results  Component Value Date   LDLCALC 136 (H) 04/26/2020   HgbA1c:  Lab Results  Component Value Date   HGBA1C 5.7 (H) 04/26/2020   Urine Drug Screen: No results found for: "LABOPIA", "COCAINSCRNUR",  "LABBENZ", "AMPHETMU", "THCU", "LABBARB"  Alcohol Level     Component Value Date/Time   ETH 203 (H) 05/05/2023 1341   INR  Lab Results  Component Value Date   INR 1.2 05/05/2023   APTT  Lab Results  Component Value Date   APTT 34 05/05/2023    CT Head without contrast(Personally reviewed): 1. Mixed density subdural hematoma along the left cerebral convexity, containing acute components and measuring up to 11 mm in thickness. 2. 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 3. Mild mass effect upon the underlying cerebral hemispheres. No midline shift. 4. No evidence of an acute infarct. 5. Cerebral atrophy.   ASSESSMENT   Magan Winnett is a 68 y.o. female presenting with likely traumatic subdural hemorrhage triggering focal seizures, with likely lowered seizure threshold in the setting of alcohol intoxication  RECOMMENDATIONS   # Focal motor status, resolved with versed via EMS -S/p 5 mg of Versed with EMS -Keppra 1000 mg dose x 1 followed by 500 mg twice daily Estimated Creatinine Clearance: 66.4 mL/min (by C-G formula based on SCr of 0.69 mg/dL).   Dose range CrCl 50 to <80 mL/minute/1.73 m2: 500 mg to 1 g every 12 hours (can be adjusted as needed for seizure control) -Mg, UA, UDS, CXR to screen for other etiologies lowering seizure threshold -CK to monitor for potential rhabdomyolysis -Routine EEG -Expect continued gradual improvement.  However if patient is not returning to baseline or has further right sided twitching, may need longer-term EEG monitoring -Seizure precautions included in patient's discharge instructions and should be reviewed with patient prior to discharge -Neurology will follow to confirm examination is improving as expected -Appreciate management of subdural hemorrhage by neurosurgery  # Alcohol use - B12, thiamine levels, HIV, RPR  -  empiric 100 mg thiamine daily pending levels - empiric 1000 mcg b12 daily - Appreciate monitoring for withdrawal and further management per ED / primary team  ______________________________________________________________________   Brooke Dare MD-PhD [facility] [phone] Available 7 AM to 7 PM, outside these hours please contact Neurologist on call listed on AMION

## 2023-05-05 NOTE — ED Notes (Signed)
 Pt to CT

## 2023-05-07 ENCOUNTER — Telehealth: Payer: Self-pay

## 2023-05-07 DIAGNOSIS — S065XAA Traumatic subdural hemorrhage with loss of consciousness status unknown, initial encounter: Secondary | ICD-10-CM

## 2023-05-07 DIAGNOSIS — Z049 Encounter for examination and observation for unspecified reason: Secondary | ICD-10-CM

## 2023-05-07 NOTE — Telephone Encounter (Signed)
 I sent a powershare request to [facility] for the new CT head she had done there 05/06/23. She saw Neurosurgery there and they signed off. I imagine it's up to the patient/family if they want to follow up with [facility] or with [place]?

## 2023-05-07 NOTE — Telephone Encounter (Signed)
 Per review of the chart, the ER called Dr Katrinka Blazing about this patient on 05/05/23 regarding a subdural hematoma. Per the message from Dr Katrinka Blazing, she needs a repeat head CT in 2-4, and an appointment (new patient) with Nehemiah Settle or Danielle after the repeat head CT. The CT has been ordered.

## 2023-05-07 NOTE — Telephone Encounter (Signed)
 clinci f/u Received: Today Lovenia Kim, MD  P [facility]-Neurosurgery Clinical Clinic: [facility] or [facility] Timeline: 2-4 weeks Tests to order: repeat head ct

## 2023-05-07 NOTE — Telephone Encounter (Signed)
 Per Eduardo Osier her husband. She is admitted at [facility] ICU, he is not sure when she will come home. Will she still need to f/u with our office?

## 2023-05-08 ENCOUNTER — Inpatient Hospital Stay
Admission: RE | Admit: 2023-05-08 | Discharge: 2023-05-08 | Disposition: A | Discharge status: Home / Self Care | Payer: Self-pay | Source: Ambulatory Visit | Attending: Neurosurgery | Admitting: Neurosurgery

## 2023-05-08 DIAGNOSIS — Z049 Encounter for examination and observation for unspecified reason: Secondary | ICD-10-CM

## 2023-05-08 LAB — VITAMIN B1: Vitamin B1 (Thiamine): 119.8 nmol/L (ref 66.5–200.0)

## 2023-05-09 ENCOUNTER — Inpatient Hospital Stay
Admission: RE | Admit: 2023-05-09 | Discharge: 2023-05-09 | Disposition: A | Discharge status: Home / Self Care | Payer: Self-pay | Source: Ambulatory Visit | Attending: Neurosurgery | Admitting: Neurosurgery

## 2023-05-09 DIAGNOSIS — Z049 Encounter for examination and observation for unspecified reason: Secondary | ICD-10-CM

## 2023-05-13 ENCOUNTER — Ambulatory Visit: Admission: RE | Admit: 2023-05-13 | Source: Ambulatory Visit

## 2023-05-15 NOTE — Telephone Encounter (Signed)
 Per Casimiro Needle pt's husband. She is currently admitted at [facility] they will continue her care there.

## 2023-07-09 ENCOUNTER — Encounter: Payer: Self-pay | Admitting: Oncology

## 2023-07-12 ENCOUNTER — Encounter: Payer: Self-pay | Admitting: Oncology

## 2023-07-12 ENCOUNTER — Inpatient Hospital Stay: Payer: HMO | Attending: Oncology

## 2023-07-16 ENCOUNTER — Inpatient Hospital Stay: Payer: HMO

## 2023-07-16 ENCOUNTER — Inpatient Hospital Stay: Payer: HMO | Admitting: Oncology

## 2023-07-19 ENCOUNTER — Other Ambulatory Visit: Payer: Self-pay | Admitting: Family Medicine

## 2023-07-19 DIAGNOSIS — Z1231 Encounter for screening mammogram for malignant neoplasm of breast: Secondary | ICD-10-CM

## 2023-08-08 ENCOUNTER — Encounter: Payer: Self-pay | Admitting: Oncology

## 2023-08-13 ENCOUNTER — Inpatient Hospital Stay: Attending: Oncology

## 2023-08-13 ENCOUNTER — Encounter: Payer: Self-pay | Admitting: Oncology

## 2023-08-13 DIAGNOSIS — K5900 Constipation, unspecified: Secondary | ICD-10-CM | POA: Diagnosis not present

## 2023-08-13 DIAGNOSIS — Z8041 Family history of malignant neoplasm of ovary: Secondary | ICD-10-CM | POA: Insufficient documentation

## 2023-08-13 DIAGNOSIS — D509 Iron deficiency anemia, unspecified: Secondary | ICD-10-CM | POA: Diagnosis present

## 2023-08-13 DIAGNOSIS — Z79899 Other long term (current) drug therapy: Secondary | ICD-10-CM | POA: Insufficient documentation

## 2023-08-13 DIAGNOSIS — Z803 Family history of malignant neoplasm of breast: Secondary | ICD-10-CM | POA: Insufficient documentation

## 2023-08-13 LAB — CBC WITH DIFFERENTIAL/PLATELET
Abs Immature Granulocytes: 0.02 10*3/uL (ref 0.00–0.07)
Basophils Absolute: 0 10*3/uL (ref 0.0–0.1)
Basophils Relative: 1 %
Eosinophils Absolute: 0.2 10*3/uL (ref 0.0–0.5)
Eosinophils Relative: 3 %
HCT: 35.8 % — ABNORMAL LOW (ref 36.0–46.0)
Hemoglobin: 11.4 g/dL — ABNORMAL LOW (ref 12.0–15.0)
Immature Granulocytes: 0 %
Lymphocytes Relative: 21 %
Lymphs Abs: 1.2 10*3/uL (ref 0.7–4.0)
MCH: 27.3 pg (ref 26.0–34.0)
MCHC: 31.8 g/dL (ref 30.0–36.0)
MCV: 85.9 fL (ref 80.0–100.0)
Monocytes Absolute: 0.4 10*3/uL (ref 0.1–1.0)
Monocytes Relative: 8 %
Neutro Abs: 3.6 10*3/uL (ref 1.7–7.7)
Neutrophils Relative %: 67 %
Platelets: 138 10*3/uL — ABNORMAL LOW (ref 150–400)
RBC: 4.17 MIL/uL (ref 3.87–5.11)
RDW: 16.9 % — ABNORMAL HIGH (ref 11.5–15.5)
WBC: 5.4 10*3/uL (ref 4.0–10.5)
nRBC: 0 % (ref 0.0–0.2)

## 2023-08-13 LAB — IRON AND TIBC
Iron: 67 ug/dL (ref 28–170)
Saturation Ratios: 20 % (ref 10.4–31.8)
TIBC: 330 ug/dL (ref 250–450)
UIBC: 263 ug/dL

## 2023-08-13 LAB — FERRITIN: Ferritin: 20 ng/mL (ref 11–307)

## 2023-08-14 ENCOUNTER — Encounter: Payer: Self-pay | Admitting: Oncology

## 2023-08-14 ENCOUNTER — Inpatient Hospital Stay

## 2023-08-14 ENCOUNTER — Inpatient Hospital Stay (HOSPITAL_BASED_OUTPATIENT_CLINIC_OR_DEPARTMENT_OTHER): Admitting: Oncology

## 2023-08-14 VITALS — BP 104/57 | HR 84 | Temp 97.6°F | Resp 16 | Ht 67.0 in | Wt 144.9 lb

## 2023-08-14 DIAGNOSIS — D509 Iron deficiency anemia, unspecified: Secondary | ICD-10-CM

## 2023-08-14 NOTE — Progress Notes (Signed)
 [facility]  [phone] [phone]  ID: Khady Vandenberg OB: 1955-04-19  MR#: [id number]  RDW#:[id number]  Patient Care Team: Lora Odor, FNP as PCP - General (Family Medicine) Theotis Lavelle BRAVO, MD as Referring Physician (Pulmonary Disease) Unk Corinn Skiff, MD as Consulting Physician (Gastroenterology) Jacobo Evalene PARAS, MD as Consulting Physician (Oncology)  CHIEF COMPLAINT: Iron  deficiency anemia.  INTERVAL HISTORY: Patient returns to clinic today for repeat laboratory work, further evaluation, and consideration of additional IV iron .  She has constipation secondary to her oral iron  supplementation, but otherwise feels well.  She does not complain of any weakness or fatigue today. She has no neurologic complaints.  She denies any recent fevers or illnesses.  She has a good appetite and denies weight loss.  She has no chest pain, shortness of breath, cough, or hemoptysis.  She denies any nausea, vomiting, or diarrhea.  She has no melena or hematochezia.  She has no urinary complaints.  Patient offers no further specific complaints today.  REVIEW OF SYSTEMS:   Review of Systems  Constitutional: Negative.  Negative for fever, malaise/fatigue and weight loss.  Respiratory: Negative.  Negative for cough, hemoptysis and shortness of breath.   Cardiovascular: Negative.  Negative for chest pain and leg swelling.  Gastrointestinal:  Positive for constipation. Negative for abdominal pain, blood in stool and melena.  Genitourinary: Negative.  Negative for hematuria.  Musculoskeletal: Negative.  Negative for back pain.  Skin: Negative.  Negative for rash.  Neurological: Negative.  Negative for dizziness, focal weakness, weakness and headaches.  Psychiatric/Behavioral: Negative.      As per HPI. Otherwise, a complete review of systems is negative.  PAST MEDICAL HISTORY: Past Medical History:  Diagnosis Date   Allergy    Anemia    Chronic  back pain    Complication of anesthesia    slow to wake   COPD (chronic obstructive pulmonary disease) (HCC)    2l home oxygen    Dyspnea    GERD (gastroesophageal reflux disease)    Headache    HTN (hypertension)    Hyperlipidemia    Insomnia    Insomnia    Osteoarthritis    Osteoporosis    Oxygen  deficiency    Tachycardia    Vertigo    none recently   Wears dentures    partial lower    PAST SURGICAL HISTORY: Past Surgical History:  Procedure Laterality Date   ABDOMINAL HYSTERECTOMY     BIOPSY  12/31/2022   Procedure: BIOPSY;  Surgeon: Unk Corinn Skiff, MD;  Location: [facility] ENDOSCOPY;  Service: Gastroenterology;;   COLONOSCOPY WITH PROPOFOL  N/A 12/31/2022   Procedure: COLONOSCOPY WITH PROPOFOL ;  Surgeon: Unk Corinn Skiff, MD;  Location: [facility] ENDOSCOPY;  Service: Gastroenterology;  Laterality: N/A;   CYST REMOVAL TRUNK     CYSTECTOMY     back and side of head   CYSTOSCOPY N/A 12/13/2015   Procedure: CYSTOSCOPY;  Surgeon: Glory High, MD;  Location: [facility] ORS;  Service: Gynecology;  Laterality: N/A;   ESOPHAGOGASTRODUODENOSCOPY (EGD) WITH PROPOFOL  N/A 12/31/2022   Procedure: ESOPHAGOGASTRODUODENOSCOPY (EGD) WITH PROPOFOL ;  Surgeon: Unk Corinn Skiff, MD;  Location: [facility] ENDOSCOPY;  Service: Gastroenterology;  Laterality: N/A;   HOT HEMOSTASIS  12/31/2022   Procedure: HOT HEMOSTASIS (ARGON PLASMA COAGULATION/BICAP);  Surgeon: Unk Corinn Skiff, MD;  Location: [facility] ENDOSCOPY;  Service: Gastroenterology;;   LAPAROSCOPIC BILATERAL SALPINGO OOPHERECTOMY Bilateral 12/13/2015   Procedure: LAPAROSCOPIC BILATERAL SALPINGO OOPHORECTOMY;  Surgeon: Glory High, MD;  Location: [facility] ORS;  Service: Gynecology;  Laterality: Bilateral;   LAPAROSCOPIC HYSTERECTOMY N/A 12/13/2015   Procedure: HYSTERECTOMY TOTAL LAPAROSCOPIC;  Surgeon: Glory High, MD;  Location: [facility] ORS;  Service: Gynecology;  Laterality: N/A;   tear duct surgery     bilateral   TUBAL LIGATION       FAMILY HISTORY: Family History  Problem Relation Age of Onset   Hypertension Mother    Diabetes Mellitus II Mother    Dementia Mother    Diabetes Mother    Cancer - Other Father        brain tumor   Cancer Father        brain tumor   Breast cancer Maternal Aunt 73   Cancer Maternal Aunt        ovarian   Breast cancer Maternal Grandmother 40   Cancer Maternal Grandmother        breast   Diabetes Brother    Hypertension Brother    COPD Brother    Cancer Maternal Grandfather        stomach   Heart disease Paternal Grandmother    Stroke Paternal Grandfather     ADVANCED DIRECTIVES (Y/N):  N  HEALTH MAINTENANCE: Social History   Tobacco Use   Smoking status: Former    Current packs/day: 0.00    Average packs/day: 0.5 packs/day for 15.0 years (7.5 ttl pk-yrs)    Types: Cigarettes    Start date: 04/24/1996    Quit date: 04/25/2011    Years since quitting: 12.3   Smokeless tobacco: Never  Vaping Use   Vaping status: Never Used  Substance Use Topics   Alcohol use: Yes    Alcohol/week: 10.0 standard drinks of alcohol    Types: 10 Shots of liquor per week    Comment: 1-2 drinks daily   Drug use: No     Colonoscopy:  PAP:  Bone density:  Lipid panel:  Allergies  Allergen Reactions   Codeine  Nausea And Vomiting    Was used while in hospital 02/24/14 and no reaction noted--01/06/15 [facility]    Current Outpatient Medications  Medication Sig Dispense Refill   albuterol  (PROVENTIL ) (2.5 MG/3ML) 0.083% nebulizer solution Take 3 mLs (2.5 mg total) by nebulization every 4 (four) hours as needed for wheezing or shortness of breath. 150 mL 3   albuterol  (VENTOLIN  HFA) 108 (90 Base) MCG/ACT inhaler Inhale 2 puffs into the lungs every 6 (six) hours as needed for wheezing or shortness of breath. 1 each 3   amLODipine  (NORVASC ) 10 MG tablet Take 1 tablet by mouth daily.     aspirin  EC 81 MG tablet Take 81 mg by mouth daily.     atorvastatin  (LIPITOR) 40 MG tablet Take 1 tablet by  mouth daily.     bisacodyl  (DULCOLAX) 5 MG EC tablet Take 5 mg by mouth daily as needed for moderate constipation.     Blood Pressure Monitoring (BLOOD PRESSURE CUFF) MISC 1 each by Does not apply route daily. 1 each 0   cyclopentolate (CYCLODRYL,CYCLOGYL) 1 % ophthalmic solution Place 1 drop into both eyes.     Ferrous Sulfate (IRON ) 325 (65 Fe) MG TABS Take 1 tablet (325 mg total) by mouth daily. 30 tablet 2   Fluticasone-Umeclidin-Vilant (TRELEGY ELLIPTA ) 100-62.5-25 MCG/INH AEPB Inhale 1 puff into the lungs daily. 1 each 11   levETIRAcetam  (KEPPRA ) 500 MG tablet Take 1 tablet (500 mg total) by mouth 2 (two) times daily for 7 days. 14 tablet 0   meclizine  (ANTIVERT ) 25 MG tablet Take 1 tablet (25  mg total) by mouth 3 (three) times daily as needed for dizziness. 30 tablet 2   meloxicam  (MOBIC ) 15 MG tablet Take 1 tablet (15 mg total) by mouth daily. 30 tablet 0   metoprolol  succinate (TOPROL -XL) 50 MG 24 hr tablet Take 50 mg by mouth daily.     OXYGEN  Inhale 2 L into the lungs as needed.      potassium chloride  (KLOR-CON ) 10 MEQ tablet Take 10 mEq by mouth daily.     traZODone  (DESYREL ) 150 MG tablet Take 150 mg by mouth at bedtime.     zonisamide (ZONEGRAN) 100 MG capsule Take 100 mg by mouth 2 (two) times daily.     docusate sodium  (COLACE) 100 MG capsule Take 100 mg by mouth 2 (two) times daily as needed for mild constipation. (Patient not taking: Reported on 08/14/2023)     omeprazole  (PRILOSEC) 40 MG capsule Take 1 capsule (40 mg total) by mouth daily before breakfast. (Patient not taking: Reported on 08/14/2023) 30 capsule 3   No current facility-administered medications for this visit.    OBJECTIVE: Vitals:   08/14/23 1329  BP: (!) 104/57  Pulse: 84  Resp: 16  Temp: 97.6 F (36.4 C)  SpO2: 100%     Body mass index is 22.69 kg/m.    ECOG FS:0 - Asymptomatic  General: Well-developed, well-nourished, no acute distress. Eyes: Pink conjunctiva, anicteric sclera. HEENT:  Normocephalic, moist mucous membranes. Lungs: No audible wheezing or coughing. Heart: Regular rate and rhythm. Abdomen: Soft, nontender, no obvious distention. Musculoskeletal: No edema, cyanosis, or clubbing. Neuro: Alert, answering all questions appropriately. Cranial nerves grossly intact. Skin: No rashes or petechiae noted. Psych: Normal affect.  LAB RESULTS:  Lab Results  Component Value Date   NA 139 05/05/2023   K 3.2 (L) 05/05/2023   CL 100 05/05/2023   CO2 26 05/05/2023   GLUCOSE 107 (H) 05/05/2023   BUN 15 05/05/2023   CREATININE 0.69 05/05/2023   CALCIUM  9.5 05/05/2023   PROT 8.3 (H) 05/05/2023   ALBUMIN 4.2 05/05/2023   AST 95 (H) 05/05/2023   ALT 27 05/05/2023   ALKPHOS 144 (H) 05/05/2023   BILITOT 1.9 (H) 05/05/2023   GFRNONAA >60 05/05/2023   GFRAA 94 04/29/2019    Lab Results  Component Value Date   WBC 5.4 08/13/2023   NEUTROABS 3.6 08/13/2023   HGB 11.4 (L) 08/13/2023   HCT 35.8 (L) 08/13/2023   MCV 85.9 08/13/2023   PLT 138 (L) 08/13/2023   Lab Results  Component Value Date   IRON  67 08/13/2023   TIBC 330 08/13/2023   IRONPCTSAT 20 08/13/2023   Lab Results  Component Value Date   FERRITIN 20 08/13/2023     STUDIES: No results found.  ASSESSMENT: Iron  deficiency anemia.  PLAN:    Iron  deficiency anemia: Patient's hemoglobin significantly improved to 11.4 and her iron  stores continue to remain within normal limits. She does not require additional IV Venofer  at this time.  Patient has been instructed to continue oral iron  supplementation.  Colonoscopy on December 31, 2022 did not reveal any significant pathology.  EGD on the same date cauterized several angiodysplastic lesions that may be the etiology of her blood loss.  No intervention is needed.  Patient last received IV Venofer  on January 31, 2023.  Return to clinic in 6 months with repeat laboratory, further evaluation, and consideration of additional treatment if needed. Constipation:  Well-controlled with current OTC regimen.  I spent a total of 20 minutes reviewing chart  data, face-to-face evaluation with the patient, counseling and coordination of care as detailed above.    Patient expressed understanding and was in agreement with this plan. She also understands that She can call clinic at any time with any questions, concerns, or complaints.    Evalene JINNY Reusing, MD   08/14/2023 3:29 PM

## 2023-08-21 ENCOUNTER — Encounter: Payer: Self-pay | Admitting: Oncology

## 2023-08-28 ENCOUNTER — Ambulatory Visit
Admission: RE | Admit: 2023-08-28 | Discharge: 2023-08-28 | Disposition: A | Discharge status: Home / Self Care | Source: Ambulatory Visit | Attending: Family Medicine | Admitting: Family Medicine

## 2023-08-28 DIAGNOSIS — Z1231 Encounter for screening mammogram for malignant neoplasm of breast: Secondary | ICD-10-CM | POA: Diagnosis present

## 2023-10-01 ENCOUNTER — Other Ambulatory Visit: Payer: Self-pay | Admitting: Specialist

## 2023-10-01 DIAGNOSIS — Z87891 Personal history of nicotine dependence: Secondary | ICD-10-CM

## 2023-10-15 ENCOUNTER — Ambulatory Visit

## 2023-10-29 ENCOUNTER — Ambulatory Visit

## 2023-11-19 ENCOUNTER — Ambulatory Visit
Admission: RE | Admit: 2023-11-19 | Discharge: 2023-11-19 | Disposition: A | Discharge status: Home / Self Care | Source: Ambulatory Visit | Attending: Specialist | Admitting: Specialist

## 2023-11-19 DIAGNOSIS — Z87891 Personal history of nicotine dependence: Secondary | ICD-10-CM | POA: Diagnosis present

## 2023-12-03 ENCOUNTER — Emergency Department

## 2023-12-03 ENCOUNTER — Inpatient Hospital Stay
Admission: EM | Admit: 2023-12-03 | Discharge: 2023-12-09 | DRG: 871 | Disposition: A | Discharge status: Home Health | Attending: Osteopathic Medicine | Admitting: Osteopathic Medicine

## 2023-12-03 ENCOUNTER — Other Ambulatory Visit: Payer: Self-pay

## 2023-12-03 ENCOUNTER — Inpatient Hospital Stay

## 2023-12-03 DIAGNOSIS — E876 Hypokalemia: Secondary | ICD-10-CM | POA: Diagnosis present

## 2023-12-03 DIAGNOSIS — G40802 Other epilepsy, not intractable, without status epilepticus: Secondary | ICD-10-CM | POA: Diagnosis not present

## 2023-12-03 DIAGNOSIS — Z79899 Other long term (current) drug therapy: Secondary | ICD-10-CM

## 2023-12-03 DIAGNOSIS — K219 Gastro-esophageal reflux disease without esophagitis: Secondary | ICD-10-CM | POA: Diagnosis present

## 2023-12-03 DIAGNOSIS — E785 Hyperlipidemia, unspecified: Secondary | ICD-10-CM | POA: Diagnosis present

## 2023-12-03 DIAGNOSIS — J439 Emphysema, unspecified: Secondary | ICD-10-CM | POA: Diagnosis present

## 2023-12-03 DIAGNOSIS — M810 Age-related osteoporosis without current pathological fracture: Secondary | ICD-10-CM | POA: Diagnosis present

## 2023-12-03 DIAGNOSIS — J449 Chronic obstructive pulmonary disease, unspecified: Secondary | ICD-10-CM | POA: Diagnosis present

## 2023-12-03 DIAGNOSIS — I10 Essential (primary) hypertension: Secondary | ICD-10-CM | POA: Diagnosis present

## 2023-12-03 DIAGNOSIS — Z7982 Long term (current) use of aspirin: Secondary | ICD-10-CM

## 2023-12-03 DIAGNOSIS — R509 Fever, unspecified: Secondary | ICD-10-CM

## 2023-12-03 DIAGNOSIS — F0150 Vascular dementia without behavioral disturbance: Secondary | ICD-10-CM | POA: Diagnosis present

## 2023-12-03 DIAGNOSIS — G319 Degenerative disease of nervous system, unspecified: Secondary | ICD-10-CM | POA: Diagnosis present

## 2023-12-03 DIAGNOSIS — R569 Unspecified convulsions: Secondary | ICD-10-CM | POA: Diagnosis not present

## 2023-12-03 DIAGNOSIS — K760 Fatty (change of) liver, not elsewhere classified: Secondary | ICD-10-CM | POA: Diagnosis present

## 2023-12-03 DIAGNOSIS — Z6841 Body Mass Index (BMI) 40.0 and over, adult: Secondary | ICD-10-CM | POA: Diagnosis not present

## 2023-12-03 DIAGNOSIS — Z90710 Acquired absence of both cervix and uterus: Secondary | ICD-10-CM

## 2023-12-03 DIAGNOSIS — F1090 Alcohol use, unspecified, uncomplicated: Secondary | ICD-10-CM | POA: Diagnosis not present

## 2023-12-03 DIAGNOSIS — R911 Solitary pulmonary nodule: Secondary | ICD-10-CM | POA: Diagnosis present

## 2023-12-03 DIAGNOSIS — K7460 Unspecified cirrhosis of liver: Secondary | ICD-10-CM | POA: Diagnosis present

## 2023-12-03 DIAGNOSIS — M549 Dorsalgia, unspecified: Secondary | ICD-10-CM | POA: Diagnosis present

## 2023-12-03 DIAGNOSIS — K5909 Other constipation: Secondary | ICD-10-CM | POA: Diagnosis present

## 2023-12-03 DIAGNOSIS — D696 Thrombocytopenia, unspecified: Secondary | ICD-10-CM | POA: Diagnosis present

## 2023-12-03 DIAGNOSIS — D509 Iron deficiency anemia, unspecified: Secondary | ICD-10-CM | POA: Diagnosis present

## 2023-12-03 DIAGNOSIS — Z59868 Other specified financial insecurity: Secondary | ICD-10-CM

## 2023-12-03 DIAGNOSIS — F10939 Alcohol use, unspecified with withdrawal, unspecified: Secondary | ICD-10-CM | POA: Diagnosis not present

## 2023-12-03 DIAGNOSIS — R4182 Altered mental status, unspecified: Secondary | ICD-10-CM

## 2023-12-03 DIAGNOSIS — K7030 Alcoholic cirrhosis of liver without ascites: Secondary | ICD-10-CM | POA: Diagnosis not present

## 2023-12-03 DIAGNOSIS — G40509 Epileptic seizures related to external causes, not intractable, without status epilepticus: Secondary | ICD-10-CM | POA: Diagnosis present

## 2023-12-03 DIAGNOSIS — Z833 Family history of diabetes mellitus: Secondary | ICD-10-CM

## 2023-12-03 DIAGNOSIS — Z87891 Personal history of nicotine dependence: Secondary | ICD-10-CM

## 2023-12-03 DIAGNOSIS — G9341 Metabolic encephalopathy: Secondary | ICD-10-CM | POA: Diagnosis present

## 2023-12-03 DIAGNOSIS — K7682 Hepatic encephalopathy: Secondary | ICD-10-CM | POA: Diagnosis present

## 2023-12-03 DIAGNOSIS — Z1152 Encounter for screening for COVID-19: Secondary | ICD-10-CM

## 2023-12-03 DIAGNOSIS — E512 Wernicke's encephalopathy: Secondary | ICD-10-CM | POA: Diagnosis not present

## 2023-12-03 DIAGNOSIS — A419 Sepsis, unspecified organism: Secondary | ICD-10-CM | POA: Diagnosis present

## 2023-12-03 DIAGNOSIS — G40909 Epilepsy, unspecified, not intractable, without status epilepticus: Secondary | ICD-10-CM

## 2023-12-03 DIAGNOSIS — Z8249 Family history of ischemic heart disease and other diseases of the circulatory system: Secondary | ICD-10-CM

## 2023-12-03 DIAGNOSIS — I6202 Nontraumatic subacute subdural hemorrhage: Secondary | ICD-10-CM | POA: Diagnosis not present

## 2023-12-03 DIAGNOSIS — Z818 Family history of other mental and behavioral disorders: Secondary | ICD-10-CM | POA: Diagnosis not present

## 2023-12-03 DIAGNOSIS — G8929 Other chronic pain: Secondary | ICD-10-CM | POA: Diagnosis present

## 2023-12-03 DIAGNOSIS — Z9851 Tubal ligation status: Secondary | ICD-10-CM

## 2023-12-03 DIAGNOSIS — I6203 Nontraumatic chronic subdural hemorrhage: Secondary | ICD-10-CM | POA: Diagnosis not present

## 2023-12-03 DIAGNOSIS — Z791 Long term (current) use of non-steroidal anti-inflammatories (NSAID): Secondary | ICD-10-CM

## 2023-12-03 DIAGNOSIS — R7989 Other specified abnormal findings of blood chemistry: Secondary | ICD-10-CM | POA: Diagnosis present

## 2023-12-03 DIAGNOSIS — R195 Other fecal abnormalities: Secondary | ICD-10-CM | POA: Diagnosis present

## 2023-12-03 LAB — URINALYSIS, W/ REFLEX TO CULTURE (INFECTION SUSPECTED)
Bilirubin Urine: NEGATIVE
Glucose, UA: NEGATIVE mg/dL
Ketones, ur: 20 mg/dL — AB
Leukocytes,Ua: NEGATIVE
Nitrite: NEGATIVE
Protein, ur: 30 mg/dL — AB
Specific Gravity, Urine: 1.016 (ref 1.005–1.030)
pH: 8 (ref 5.0–8.0)

## 2023-12-03 LAB — CBC WITH DIFFERENTIAL/PLATELET
Abs Immature Granulocytes: 0.04 K/uL (ref 0.00–0.07)
Abs Immature Granulocytes: 0.04 K/uL (ref 0.00–0.07)
Basophils Absolute: 0 K/uL (ref 0.0–0.1)
Basophils Absolute: 0 K/uL (ref 0.0–0.1)
Basophils Relative: 0 %
Basophils Relative: 1 %
Eosinophils Absolute: 0 K/uL (ref 0.0–0.5)
Eosinophils Absolute: 0 K/uL (ref 0.0–0.5)
Eosinophils Relative: 0 %
Eosinophils Relative: 0 %
HCT: 34.1 % — ABNORMAL LOW (ref 36.0–46.0)
HCT: 31.5 % — ABNORMAL LOW (ref 36.0–46.0)
Hemoglobin: 11 g/dL — ABNORMAL LOW (ref 12.0–15.0)
Hemoglobin: 10.3 g/dL — ABNORMAL LOW (ref 12.0–15.0)
Immature Granulocytes: 1 %
Immature Granulocytes: 1 %
Lymphocytes Relative: 7 %
Lymphocytes Relative: 11 %
Lymphs Abs: 0.5 K/uL — ABNORMAL LOW (ref 0.7–4.0)
Lymphs Abs: 0.8 K/uL (ref 0.7–4.0)
MCH: 31.3 pg (ref 26.0–34.0)
MCH: 31.7 pg (ref 26.0–34.0)
MCHC: 32.3 g/dL (ref 30.0–36.0)
MCHC: 32.7 g/dL (ref 30.0–36.0)
MCV: 96.9 fL (ref 80.0–100.0)
MCV: 96.9 fL (ref 80.0–100.0)
Monocytes Absolute: 0.7 K/uL (ref 0.1–1.0)
Monocytes Absolute: 0.9 K/uL (ref 0.1–1.0)
Monocytes Relative: 9 %
Monocytes Relative: 12 %
Neutro Abs: 5.9 K/uL (ref 1.7–7.7)
Neutro Abs: 5.4 K/uL (ref 1.7–7.7)
Neutrophils Relative %: 83 %
Neutrophils Relative %: 75 %
Platelets: 86 K/uL — ABNORMAL LOW (ref 150–400)
Platelets: 72 K/uL — ABNORMAL LOW (ref 150–400)
RBC: 3.52 MIL/uL — ABNORMAL LOW (ref 3.87–5.11)
RBC: 3.25 MIL/uL — ABNORMAL LOW (ref 3.87–5.11)
RDW: 15.6 % — ABNORMAL HIGH (ref 11.5–15.5)
RDW: 15.7 % — ABNORMAL HIGH (ref 11.5–15.5)
WBC: 7.1 K/uL (ref 4.0–10.5)
WBC: 7.1 K/uL (ref 4.0–10.5)
nRBC: 1 % — ABNORMAL HIGH (ref 0.0–0.2)
nRBC: 0.6 % — ABNORMAL HIGH (ref 0.0–0.2)

## 2023-12-03 LAB — COMPREHENSIVE METABOLIC PANEL WITH GFR
ALT: 25 U/L (ref 0–44)
AST: 101 U/L — ABNORMAL HIGH (ref 15–41)
Albumin: 4.5 g/dL (ref 3.5–5.0)
Alkaline Phosphatase: 155 U/L — ABNORMAL HIGH (ref 38–126)
Anion gap: 15 (ref 5–15)
BUN: 23 mg/dL (ref 8–23)
CO2: 22 mmol/L (ref 22–32)
Calcium: 10.3 mg/dL (ref 8.9–10.3)
Chloride: 99 mmol/L (ref 98–111)
Creatinine, Ser: 0.74 mg/dL (ref 0.44–1.00)
GFR, Estimated: >60 mL/min (ref >60)
Glucose, Bld: 137 mg/dL — ABNORMAL HIGH (ref 70–99)
Potassium: 3.4 mmol/L — ABNORMAL LOW (ref 3.5–5.1)
Sodium: 136 mmol/L (ref 135–145)
Total Bilirubin: 2.7 mg/dL — ABNORMAL HIGH (ref 0.0–1.2)
Total Protein: 8.3 g/dL — ABNORMAL HIGH (ref 6.5–8.1)

## 2023-12-03 LAB — URINE DRUG SCREEN, QUALITATIVE (ARMC ONLY)
Amphetamines, Ur Screen: NOT DETECTED
Barbiturates, Ur Screen: NOT DETECTED
Benzodiazepine, Ur Scrn: POSITIVE — AB
Cannabinoid 50 Ng, Ur ~~LOC~~: NOT DETECTED
Cocaine Metabolite,Ur ~~LOC~~: NOT DETECTED
MDMA (Ecstasy)Ur Screen: NOT DETECTED
Methadone Scn, Ur: NOT DETECTED
Opiate, Ur Screen: NOT DETECTED
Phencyclidine (PCP) Ur S: NOT DETECTED
Tricyclic, Ur Screen: NOT DETECTED

## 2023-12-03 LAB — APTT: aPTT: 36 s (ref 24–36)

## 2023-12-03 LAB — CSF CELL COUNT WITH DIFFERENTIAL
Eosinophils, CSF: 0 %
Lymphs, CSF: 0 %
Monocyte-Macrophage-Spinal Fluid: 0 %
RBC Count, CSF: 0 /mm3 (ref 0–3)
RBC Count, CSF: 0 /mm3 (ref 0–3)
Segmented Neutrophils-CSF: 100 %
Tube #: 1
Tube #: 4
WBC, CSF: 0 /mm3 (ref 0–5)
WBC, CSF: 7 /mm3 — ABNORMAL HIGH (ref 0–5)

## 2023-12-03 LAB — RESP PANEL BY RT-PCR (RSV, FLU A&B, COVID)  RVPGX2
Influenza A by PCR: NEGATIVE
Influenza B by PCR: NEGATIVE
Resp Syncytial Virus by PCR: NEGATIVE
SARS Coronavirus 2 by RT PCR: NEGATIVE

## 2023-12-03 LAB — PROCALCITONIN: Procalcitonin: 1.65 ng/mL

## 2023-12-03 LAB — AMMONIA: Ammonia: 46 umol/L — ABNORMAL HIGH (ref 9–35)

## 2023-12-03 LAB — LACTIC ACID, PLASMA: Lactic Acid, Venous: 1.7 mmol/L (ref 0.5–1.9)

## 2023-12-03 LAB — TROPONIN I (HIGH SENSITIVITY)
Troponin I (High Sensitivity): 46 ng/L — ABNORMAL HIGH (ref <18)
Troponin I (High Sensitivity): 53 ng/L — ABNORMAL HIGH (ref <18)

## 2023-12-03 LAB — TECHNOLOGIST SMEAR REVIEW: Plt Morphology: NORMAL

## 2023-12-03 LAB — TYPE AND SCREEN
ABO/RH(D): O POS
Antibody Screen: NEGATIVE

## 2023-12-03 LAB — PROTEIN AND GLUCOSE, CSF
Glucose, CSF: 78 mg/dL — ABNORMAL HIGH (ref 40–70)
Total  Protein, CSF: 38 mg/dL (ref 15–45)

## 2023-12-03 LAB — PROTIME-INR
INR: 1.3 — ABNORMAL HIGH (ref 0.8–1.2)
Prothrombin Time: 16.5 s — ABNORMAL HIGH (ref 11.4–15.2)

## 2023-12-03 LAB — D-DIMER, QUANTITATIVE: D-Dimer, Quant: 0.87 ug{FEU}/mL — ABNORMAL HIGH (ref 0.00–0.50)

## 2023-12-03 LAB — ACETAMINOPHEN LEVEL: Acetaminophen (Tylenol), Serum: 19 ug/mL (ref 10–30)

## 2023-12-03 LAB — SALICYLATE LEVEL: Salicylate Lvl: <7 mg/dL — ABNORMAL LOW (ref 7.0–30.0)

## 2023-12-03 MED ORDER — VANCOMYCIN HCL 750 MG/150ML IV SOLN
750.0000 mg | Freq: Two times a day (BID) | INTRAVENOUS | Status: DC
  Administered 2023-12-04: 750 mg via INTRAVENOUS
  Filled 2023-12-03 (×2): qty 150

## 2023-12-03 MED ORDER — MIDAZOLAM HCL 2 MG/2ML IJ SOLN
2.0000 mg | Freq: Once | INTRAMUSCULAR | Status: AC
Start: 2023-12-03 — End: 2023-12-03
  Administered 2023-12-03: 2 mg via INTRAVENOUS
  Filled 2023-12-03: qty 2

## 2023-12-03 MED ORDER — MIDAZOLAM HCL 2 MG/2ML IJ SOLN
2.0000 mg | Freq: Once | INTRAMUSCULAR | Status: DC
  Filled 2023-12-03: qty 2

## 2023-12-03 MED ORDER — SODIUM CHLORIDE 0.9 % IV SOLN
2.0000 g | Freq: Once | INTRAVENOUS | Status: AC
  Administered 2023-12-03: 2 g via INTRAVENOUS
  Filled 2023-12-03: qty 12.5

## 2023-12-03 MED ORDER — ALBUTEROL SULFATE (2.5 MG/3ML) 0.083% IN NEBU: 2.5000 mg | INHALATION_SOLUTION | RESPIRATORY_TRACT | Status: DC | PRN

## 2023-12-03 MED ORDER — VANCOMYCIN HCL 500 MG/100ML IV SOLN
500.0000 mg | Freq: Once | INTRAVENOUS | Status: AC
  Administered 2023-12-04: 500 mg via INTRAVENOUS
  Filled 2023-12-03: qty 100

## 2023-12-03 MED ORDER — ACETAMINOPHEN 325 MG PO TABS: 650.0000 mg | ORAL_TABLET | Freq: Four times a day (QID) | ORAL | Status: DC | PRN

## 2023-12-03 MED ORDER — METRONIDAZOLE 500 MG/100ML IV SOLN
500.0000 mg | Freq: Once | INTRAVENOUS | Status: AC
  Administered 2023-12-03: 500 mg via INTRAVENOUS
  Filled 2023-12-03: qty 100

## 2023-12-03 MED ORDER — LEVETIRACETAM (KEPPRA) 500 MG/5 ML ADULT IV PUSH
500.0000 mg | Freq: Two times a day (BID) | INTRAVENOUS | Status: DC
  Administered 2023-12-03 – 2023-12-04 (×2): 500 mg via INTRAVENOUS
  Filled 2023-12-03 (×2): qty 5

## 2023-12-03 MED ORDER — SODIUM CHLORIDE 0.9 % IV SOLN
2.0000 g | Freq: Three times a day (TID) | INTRAVENOUS | Status: DC
  Administered 2023-12-04 (×2): 2 g via INTRAVENOUS
  Filled 2023-12-03 (×2): qty 12.5

## 2023-12-03 MED ORDER — ACETAMINOPHEN 325 MG PO TABS
650.0000 mg | ORAL_TABLET | Freq: Once | ORAL | Status: AC
  Administered 2023-12-03: 650 mg via ORAL
  Filled 2023-12-03: qty 2

## 2023-12-03 MED ORDER — ACETAMINOPHEN 650 MG RE SUPP
650.0000 mg | Freq: Four times a day (QID) | RECTAL | Status: DC | PRN
Start: 2023-12-03 — End: 2023-12-09
  Administered 2023-12-07: 650 mg via RECTAL
  Filled 2023-12-03: qty 1

## 2023-12-03 MED ORDER — ONDANSETRON HCL 4 MG PO TABS: 4.0000 mg | ORAL_TABLET | Freq: Four times a day (QID) | ORAL | Status: DC | PRN

## 2023-12-03 MED ORDER — LACTATED RINGERS IV SOLN
150.0000 mL/h | INTRAVENOUS | Status: DC
  Administered 2023-12-03: 150 mL/h via INTRAVENOUS

## 2023-12-03 MED ORDER — LACTATED RINGERS IV BOLUS (SEPSIS)
1000.0000 mL | Freq: Once | INTRAVENOUS | Status: AC
  Administered 2023-12-03: 1000 mL via INTRAVENOUS

## 2023-12-03 MED ORDER — VANCOMYCIN HCL IN DEXTROSE 1-5 GM/200ML-% IV SOLN: 1000.0000 mg | Freq: Once | INTRAVENOUS | Status: DC

## 2023-12-03 MED ORDER — METRONIDAZOLE 500 MG/100ML IV SOLN
500.0000 mg | Freq: Two times a day (BID) | INTRAVENOUS | Status: DC
  Administered 2023-12-04: 500 mg via INTRAVENOUS
  Filled 2023-12-03: qty 100

## 2023-12-03 MED ORDER — ACETAMINOPHEN 10 MG/ML IV SOLN
1000.0000 mg | Freq: Four times a day (QID) | INTRAVENOUS | Status: AC
  Administered 2023-12-03 – 2023-12-04 (×3): 1000 mg via INTRAVENOUS
  Filled 2023-12-03 (×5): qty 100

## 2023-12-03 MED ORDER — ACETAMINOPHEN 325 MG RE SUPP: 650.0000 mg | Freq: Once | RECTAL | Status: DC

## 2023-12-03 MED ORDER — VANCOMYCIN HCL IN DEXTROSE 1-5 GM/200ML-% IV SOLN
1000.0000 mg | Freq: Once | INTRAVENOUS | Status: AC
  Administered 2023-12-03: 1000 mg via INTRAVENOUS
  Filled 2023-12-03: qty 200

## 2023-12-03 MED ORDER — ONDANSETRON HCL 4 MG/2ML IJ SOLN
4.0000 mg | Freq: Four times a day (QID) | INTRAMUSCULAR | Status: DC | PRN
Start: 2023-12-03 — End: 2023-12-09
  Administered 2023-12-04: 4 mg via INTRAVENOUS

## 2023-12-03 MED ORDER — LORAZEPAM 2 MG/ML IJ SOLN: 1.0000 mg | INTRAMUSCULAR | Status: DC | PRN

## 2023-12-03 NOTE — Progress Notes (Signed)
 Pharmacy Antibiotic Note  Andrea Bell is a 68 y.o. female admitted on 12/03/2023 with possible infection of unknown source.  Pharmacy has been consulted for Cefepime and Vancomycin dosing for 7 days.  Plan: Cefepime 2 gm q8hr per indication & renal fxn.  Pt given Vancomycin 1500 mg once. Vancomycin 750 mg IV Q 12 hrs. Goal AUC 400-550. Expected AUC: 527.5 SCr used: 0.8 (10/14 SCr = 0.74) Follow up culture results to assess for antibiotic optimization. Monitor renal function to assess for any necessary antibiotic dosing changes. Pharmacy will continue to follow and will adjust abx dosing whenever warranted.  Temp (24hrs), Avg:101.5 F (38.6 C), Min:100 F (37.8 C), Max:102.7 F (39.3 C)   Recent Labs  Lab 12/03/23 1439 12/03/23 2009  WBC 7.1 7.1  CREATININE 0.74  --   LATICACIDVEN 1.7  --     Estimated Creatinine Clearance: 66.4 mL/min (by C-G formula based on SCr of 0.74 mg/dL).    Allergies  Allergen Reactions   Codeine  Nausea And Vomiting    Was used while in hospital 02/24/14 and no reaction noted--01/06/15 BC    Antimicrobials this admission: 10/14 Cefepime >> x 7 days 10/14 Flagyl >> x 7 days 10/14 Vancomycin >> x 7 days  Microbiology results: 10/14 BCx: Pending 10/14 CSFCx: RBC Present / No Organisms Seen  Thank you for allowing pharmacy to be a part of this patient's care.  Andrea Bell, PharmD, [facility] 12/03/2023 10:24 PM

## 2023-12-03 NOTE — Assessment & Plan Note (Addendum)
 Sepsis criteria include fever, tachycardia albeit with normal WBC and lactic acid Thrombocytopenia and elevated LFTs of uncertain etiology-uncertain relation to sepsis Underwent LP in the ED, CSF studies pending---> appearing sterile so far UA and CXR, head CT all unrevealing Getting procalcitonin Continue broad-spectrum antibiotics to treat sepsis of unknown source Continue sepsis fluids

## 2023-12-03 NOTE — Consult Note (Signed)
 CODE SEPSIS - PHARMACY COMMUNICATION  **Broad Spectrum Antibiotics should be administered within 1 hour of Sepsis diagnosis**  Time Code Sepsis Called/Page Received: 1525  Antibiotics Ordered: Cefepime 2G x1, Metronidazole 500 mg x1, Vancomycin 1G x1   Time of 1st antibiotic administration: 1544  Additional action taken by pharmacy: none   Annabella LOISE Banks ,PharmD Clinical Pharmacist  12/03/2023  3:32 PM

## 2023-12-03 NOTE — Assessment & Plan Note (Signed)
 H/H stable.  Continue to monitor

## 2023-12-03 NOTE — Assessment & Plan Note (Addendum)
 Suspect secondary to sepsis in the setting of seizure a couple days prior-doubt prolonged postictal. CT head nonacute, showing ...mild cerebral atrophy with widening of the extra-axial spaces and ventricular dilatation. There are areas of decreased attenuation within the white matter tracts of the supratentorial brain, consistent with microvascular disease changes We will keep n.p.o. Ordered ammonia, acetaminophen  and salicylate acid levels--> all negative Neurologic checks with fall and aspiration precautions

## 2023-12-03 NOTE — Assessment & Plan Note (Addendum)
 Platelets 86,000 down from baseline of 138,000 Likely secondary to severe sepsis, possible DIC In the absence of leukocytosis and lactic acid we will explore other potential etiologies([place], less likely TTP) Hematologic workup to include fibrinogen D-dimer, PTT, smear, LDH, Adams 13 Will get abdominal CT, up possible ultrasound RUQ Serial platelets

## 2023-12-03 NOTE — Assessment & Plan Note (Addendum)
 Holding antihypertensives in the setting of sepsis Hydralazine IV as needed

## 2023-12-03 NOTE — H&P (Signed)
 History and Physical    Patient: Andrea Bell FMW:[id number] DOB: [birth year] DOA: 12/03/2023 DOS: the patient was seen and examined on 12/03/2023 PCP: Lora Odor, FNP  Patient coming from: Home  Chief Complaint:  Chief Complaint  Patient presents with  . Seizures    HPI: Andrea Bell is a 68 y.o. 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  History from husband is that following the seizure  Andrea Bell has been confused, weak, shaking and mumbling which is far from her baseline On arrival, Tmax 102.7, tachypneic to 108, BP 159/72 and O2 sat 97 on room air. Labs notable for normal WBC of 7.1 and normal lactic acid of 1.7. Hemoglobin at baseline at 11.  Thrombocytopenia of 86, down from baseline of 138 CMP notable for abnormal LFTs with AST/ALT 101/25, alk phos 155 and total bilirubin 2.7 Urinalysis with ketones, rare bacteria but otherwise not consistent with UTI Respiratory viral panel negative for COVID flu and RSV CSF done, results pending Chest x-ray nonacute Head CT nonacute Patient started on sepsis fluids, broad-spectrum antibiotics for sepsis of unknown source Admission requested     Past Medical History:  Diagnosis Date  . Allergy   . Anemia   . Chronic back pain   . Complication of anesthesia    slow to wake  . COPD (chronic obstructive pulmonary disease) (HCC)    2l home oxygen   . Dyspnea   . GERD (gastroesophageal reflux disease)   . Headache   . HTN (hypertension)   . Hyperlipidemia   . Insomnia   . Insomnia   . Osteoarthritis   . Osteoporosis   . Oxygen  deficiency   . Tachycardia   . Vertigo    none recently  . Wears dentures    partial lower   Past Surgical History:  Procedure Laterality Date  . ABDOMINAL HYSTERECTOMY    .  BIOPSY  12/31/2022   Procedure: BIOPSY;  Surgeon: Unk Corinn Skiff, MD;  Location: [facility] ENDOSCOPY;  Service: Gastroenterology;;  . COLONOSCOPY WITH PROPOFOL  N/A 12/31/2022   Procedure: COLONOSCOPY WITH PROPOFOL ;  Surgeon: Unk Corinn Skiff, MD;  Location: [facility] ENDOSCOPY;  Service: Gastroenterology;  Laterality: N/A;  . CYST REMOVAL TRUNK    . CYSTECTOMY     back and side of head  . CYSTOSCOPY N/A 12/13/2015   Procedure: CYSTOSCOPY;  Surgeon: Glory High, MD;  Location: [facility] ORS;  Service: Gynecology;  Laterality: N/A;  . ESOPHAGOGASTRODUODENOSCOPY (EGD) WITH PROPOFOL  N/A 12/31/2022   Procedure: ESOPHAGOGASTRODUODENOSCOPY (EGD) WITH PROPOFOL ;  Surgeon: Unk Corinn Skiff, MD;  Location: [facility] ENDOSCOPY;  Service: Gastroenterology;  Laterality: N/A;  . HOT HEMOSTASIS  12/31/2022   Procedure: HOT HEMOSTASIS (ARGON PLASMA COAGULATION/BICAP);  Surgeon: Unk Corinn Skiff, MD;  Location: [facility] ENDOSCOPY;  Service: Gastroenterology;;  . LAPAROSCOPIC BILATERAL SALPINGO OOPHERECTOMY Bilateral 12/13/2015   Procedure: LAPAROSCOPIC BILATERAL SALPINGO OOPHORECTOMY;  Surgeon: Glory High, MD;  Location: [facility] ORS;  Service: Gynecology;  Laterality: Bilateral;  . LAPAROSCOPIC HYSTERECTOMY N/A 12/13/2015   Procedure: HYSTERECTOMY TOTAL LAPAROSCOPIC;  Surgeon: Glory High, MD;  Location: [facility] ORS;  Service: Gynecology;  Laterality: N/A;  . tear duct surgery     bilateral  . TUBAL LIGATION     Social History:  reports that Andrea Bell quit smoking about 12 years ago. Her smoking use included cigarettes. Andrea Bell started smoking about 27 years  ago. Andrea Bell has a 7.5 pack-year smoking history. Andrea Bell has never used smokeless tobacco. Andrea Bell reports current alcohol use of about 10.0 standard drinks of alcohol per week. Andrea Bell reports that Andrea Bell does not use drugs.  Allergies  Allergen Reactions  . Codeine  Nausea And Vomiting    Was used while in hospital 02/24/14 and no reaction noted--01/06/15 BC    Family History   Problem Relation Age of Onset  . Hypertension Mother   . Diabetes Mellitus II Mother   . Dementia Mother   . Diabetes Mother   . Cancer - Other Father        brain tumor  . Cancer Father        brain tumor  . Breast cancer Maternal Aunt 57  . Cancer Maternal Aunt        ovarian  . Breast cancer Maternal Grandmother 61  . Cancer Maternal Grandmother        breast  . Diabetes Brother   . Hypertension Brother   . COPD Brother   . Cancer Maternal Grandfather        stomach  . Heart disease Paternal Grandmother   . Stroke Paternal Grandfather     Prior to Admission medications   Medication Sig Start Date End Date Taking? Authorizing Provider  albuterol  (PROVENTIL ) (2.5 MG/3ML) 0.083% nebulizer solution Take 3 mLs (2.5 mg total) by nebulization every 4 (four) hours as needed for wheezing or shortness of breath. 04/18/18   Hinton Newell SQUIBB, MD  albuterol  (VENTOLIN  HFA) 108 (90 Base) MCG/ACT inhaler Inhale 2 puffs into the lungs every 6 (six) hours as needed for wheezing or shortness of breath. 04/26/20   Rumball, Alison M, DO  amLODipine  (NORVASC ) 10 MG tablet Take 1 tablet by mouth daily. 05/17/22   [provider]  aspirin  EC 81 MG tablet Take 81 mg by mouth daily.    [provider]  atorvastatin  (LIPITOR) 40 MG tablet Take 1 tablet by mouth daily. 05/02/22   [provider]  bisacodyl  (DULCOLAX) 5 MG EC tablet Take 5 mg by mouth daily as needed for moderate constipation.    [provider]  Blood Pressure Monitoring (BLOOD PRESSURE CUFF) MISC 1 each by Does not apply route daily. 04/26/20   Rumball, Alison M, DO  cyclopentolate (CYCLODRYL,CYCLOGYL) 1 % ophthalmic solution Place 1 drop into both eyes. 06/14/21   [provider]  docusate sodium  (COLACE) 100 MG capsule Take 100 mg by mouth 2 (two) times daily as needed for mild constipation. Patient not taking: Reported on 08/14/2023    [provider]  Ferrous Sulfate (IRON ) 325 (65 Fe) MG  TABS Take 1 tablet (325 mg total) by mouth daily. 11/27/22   Goodman, Graydon, MD  Fluticasone-Umeclidin-Vilant (TRELEGY ELLIPTA ) 100-62.5-25 MCG/INH AEPB Inhale 1 puff into the lungs daily. 04/26/20   Rumball, Alison M, DO  levETIRAcetam  (KEPPRA ) 500 MG tablet Take 1 tablet (500 mg total) by mouth 2 (two) times daily for 7 days. 05/05/23 08/14/23  Claudene Rover, MD  meclizine  (ANTIVERT ) 25 MG tablet Take 1 tablet (25 mg total) by mouth 3 (three) times daily as needed for dizziness. 05/09/17   Hinton Newell SQUIBB, MD  meloxicam  (MOBIC ) 15 MG tablet Take 1 tablet (15 mg total) by mouth daily. 07/15/20   Cuthriell, Jonathan D, PA-C  metoprolol  succinate (TOPROL -XL) 50 MG 24 hr tablet Take 50 mg by mouth daily. 09/17/22 09/17/23  [provider]  omeprazole  (PRILOSEC) 40 MG capsule Take 1 capsule (40 mg  total) by mouth daily before breakfast. Patient not taking: Reported on 08/14/2023 12/31/22 04/30/23  Vanga, Rohini Reddy, MD  OXYGEN  Inhale 2 L into the lungs as needed.     [provider]  potassium chloride  (KLOR-CON ) 10 MEQ tablet Take 10 mEq by mouth daily.    [provider]  traZODone  (DESYREL ) 150 MG tablet Take 150 mg by mouth at bedtime. 09/17/22   [provider]  zonisamide (ZONEGRAN) 100 MG capsule Take 100 mg by mouth 2 (two) times daily.    [provider]    Physical Exam: Vitals:   12/03/23 1730 12/03/23 1800 12/03/23 1825 12/03/23 2011  BP: (!) 160/71 (!) 146/94  136/62  Pulse: (!) 108 (!) 111  (!) 102  Resp: 17 19  20   Temp:   (!) 102.7 F (39.3 C)   TempSrc:   Oral   SpO2: 97% 97%  100%  Weight:      Height:       Physical Exam Vitals and nursing note reviewed.  Constitutional:      General: Andrea Bell is not in acute distress. HENT:     Head: Normocephalic and atraumatic.  Cardiovascular:     Rate and Rhythm: Normal rate and regular rhythm.     Heart sounds: Normal heart sounds.  Pulmonary:     Effort: Pulmonary effort is normal.     Breath  sounds: Normal breath sounds.  Abdominal:     Palpations: Abdomen is soft.     Tenderness: There is no abdominal tenderness.  Neurological:     Mental Status: Mental status is at baseline.     Labs on Admission: I have personally reviewed following labs and imaging studies  CBC: Recent Labs  Lab 12/03/23 1439 12/03/23 2009  WBC 7.1 7.1  NEUTROABS 5.9 5.4  HGB 11.0* 10.3*  HCT 34.1* 31.5*  MCV 96.9 96.9  PLT 86* 72*   Basic Metabolic Panel: Recent Labs  Lab 12/03/23 1439  NA 136  K 3.4*  CL 99  CO2 22  GLUCOSE 137*  BUN 23  CREATININE 0.74  CALCIUM  10.3   GFR: Estimated Creatinine Clearance: 66.4 mL/min (by C-G formula based on SCr of 0.74 mg/dL). Liver Function Tests: Recent Labs  Lab 12/03/23 1439  AST 101*  ALT 25  ALKPHOS 155*  BILITOT 2.7*  PROT 8.3*  ALBUMIN 4.5   No results for input(s): LIPASE, AMYLASE in the last 168 hours. No results for input(s): AMMONIA in the last 168 hours. Coagulation Profile: Recent Labs  Lab 12/03/23 1439  INR 1.3*   Cardiac Enzymes: No results for input(s): CKTOTAL, CKMB, CKMBINDEX, TROPONINI in the last 168 hours. BNP (last 3 results) No results for input(s): PROBNP in the last 8760 hours. HbA1C: No results for input(s): HGBA1C in the last 72 hours. CBG: No results for input(s): GLUCAP in the last 168 hours. Lipid Profile: No results for input(s): CHOL, HDL, LDLCALC, TRIG, CHOLHDL, LDLDIRECT in the last 72 hours. Thyroid  Function Tests: No results for input(s): TSH, T4TOTAL, FREET4, T3FREE, THYROIDAB in the last 72 hours. Anemia Panel: No results for input(s): VITAMINB12, FOLATE, FERRITIN, TIBC, IRON , RETICCTPCT in the last 72 hours. Urine analysis:    Component Value Date/Time   COLORURINE YELLOW (A) 12/03/2023 1436   APPEARANCEUR CLEAR (A) 12/03/2023 1436   APPEARANCEUR Clear 02/05/2013 1159   LABSPEC 1.016 12/03/2023 1436   LABSPEC 1.024 02/05/2013  1159   PHURINE 8.0 12/03/2023 1436   GLUCOSEU NEGATIVE 12/03/2023 1436   GLUCOSEU  Negative 02/05/2013 1159   HGBUR SMALL (A) 12/03/2023 1436   BILIRUBINUR NEGATIVE 12/03/2023 1436   BILIRUBINUR Negative 02/05/2013 1159   KETONESUR 20 (A) 12/03/2023 1436   PROTEINUR 30 (A) 12/03/2023 1436   NITRITE NEGATIVE 12/03/2023 1436   LEUKOCYTESUR NEGATIVE 12/03/2023 1436   LEUKOCYTESUR Negative 02/05/2013 1159    Radiological Exams on Admission: CT Head Wo Contrast Result Date: 12/03/2023 CLINICAL DATA:  Altered mental status. EXAM: CT HEAD WITHOUT CONTRAST TECHNIQUE: Contiguous axial images were obtained from the base of the skull through the vertex without intravenous contrast. RADIATION DOSE REDUCTION: This exam was performed according to the departmental dose-optimization program which includes automated exposure control, adjustment of the mA and/or kV according to patient size and/or use of iterative reconstruction technique. COMPARISON:  May 06, 2023 FINDINGS: Brain: There is mild cerebral atrophy with widening of the extra-axial spaces and ventricular dilatation. There are areas of decreased attenuation within the white matter tracts of the supratentorial brain, consistent with microvascular disease changes. Vascular: No hyperdense vessel or unexpected calcification. Skull: Normal. Negative for fracture or focal lesion. Sinuses/Orbits: No acute finding. Other: None. IMPRESSION: No acute intracranial abnormality. Electronically Signed   By: Suzen Dials M.D.   On: 12/03/2023 15:52   DG Chest Portable 1 View Result Date: 12/03/2023 CLINICAL DATA:  fever EXAM: DG CHEST 1V PORT COMPARISON:  05/05/2023 FINDINGS: No focal airspace consolidation, pleural effusion, or pneumothorax. No cardiomegaly. Tortuous aorta with aortic atherosclerosis. No acute fracture or destructive lesions. Multilevel thoracic osteophytosis. Age-indeterminate fracture of the distal right clavicle. IMPRESSION: 1. No acute  cardiopulmonary abnormality. 2. Age-indeterminate fracture of the distal right clavicle, likely chronic. Correlation with point tenderness recommended to assess for acuity. Electronically Signed   By: Rogelia Myers M.D.   On: 12/03/2023 15:24   Data Reviewed for HPI: Relevant notes from primary care and specialist visits, past discharge summaries as available in EHR, including Care Everywhere. Prior diagnostic testing as pertinent to current admission diagnoses Updated medications and problem lists for reconciliation ED course, including vitals, labs, imaging, treatment and response to treatment Triage notes, nursing and pharmacy notes and ED provider's notes Notable results as noted above in HPI      Assessment and Plan: * Sepsis (HCC) Sepsis criteria include fever, tachycardia albeit with normal WBC and lactic acid Thrombocytopenia and elevated LFTs of uncertain etiology-uncertain relation to sepsis Underwent LP in the ED, CSF studies pending---> appearing sterile so far UA and CXR, head CT all unrevealing Getting procalcitonin Continue broad-spectrum antibiotics to treat sepsis of unknown source Continue sepsis fluids   Thrombocytopenia Platelets 86,000 down from baseline of 138,000 Likely secondary to severe sepsis, possible DIC In the absence of leukocytosis and lactic acid we will explore other potential etiologies([place], less likely TTP) Hematologic workup to include fibrinogen D-dimer, PTT, smear, LDH, Adams 13 Will get abdominal CT, up possible ultrasound RUQ Serial platelets   Acute metabolic encephalopathy Suspect secondary to sepsis in the setting of seizure a couple days prior-doubt prolonged postictal. CT head nonacute, showing ...mild cerebral atrophy with widening of the extra-axial spaces and ventricular dilatation. There are areas of decreased attenuation within the white matter tracts of the supratentorial brain, consistent with microvascular disease  changes We will keep n.p.o. 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 Moderate rectal stool burden, suggesting mild fecal impaction. Early stercoral colitis is possible. 2. Cirrhosis and hepatic steatosis  Iron  deficiency anemia, unspecified H&H stable Continue to monitor  Essential hypertension, benign Holding antihypertensives in the setting of sepsis Hydralazine IV as needed  COPD (chronic obstructive pulmonary disease) (HCC) Not acutely exacerbated DuoNebs as needed        DVT prophylaxis: SCD  Consults: none  Advance Care Planning:   Code Status: Prior   Family Communication: none***  Disposition Plan: Back to previous home environment  Severity of Illness: The appropriate patient status for this patient is INPATIENT. Inpatient status is judged to be reasonable and necessary in order to provide the required intensity of service to ensure the patient's safety. The patient's presenting symptoms, physical exam findings, and initial radiographic and laboratory data in the context of their chronic comorbidities is felt to place them at high risk for further clinical deterioration. Furthermore, it is not anticipated that the patient will be medically stable for discharge from the hospital within 2 midnights of admission.   * I certify that at the point of admission it is my clinical judgment that the patient will require inpatient hospital care spanning beyond 2 midnights from the point of admission due to high intensity of service, high risk for further deterioration and  high frequency of surveillance required.*  CRITICAL CARE Performed by: Delayne LULLA Solian   Total critical care time: 90 minutes  Critical care time was exclusive of separately billable procedures and treating other patients.  Critical care was necessary to treat or prevent imminent or life-threatening deterioration.  Critical care was time spent personally by me on the following activities: development of treatment plan with patient and/or surrogate as well as nursing, discussions with consultants, evaluation of patient's response to treatment, examination of patient, obtaining history from patient or surrogate, ordering and performing treatments and interventions, ordering and review of laboratory studies, ordering and review of radiographic studies, pulse oximetry and re-evaluation of patient's condition.  Author: Delayne LULLA Solian, MD 12/03/2023 8:36 PM  For on call review [email or web address].

## 2023-12-03 NOTE — Assessment & Plan Note (Addendum)
 Near baseline from 7 months prior We will get CT abdomen and pelvis and follow-up with right upper quadrant ultrasound if needed CT A/P:IMPRESSION: 1. Moderate rectal stool burden, suggesting mild fecal impaction. Early stercoral colitis is possible. 2. Cirrhosis and hepatic steatosis

## 2023-12-03 NOTE — Assessment & Plan Note (Addendum)
 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

## 2023-12-03 NOTE — Assessment & Plan Note (Signed)
 Not acutely exacerbated DuoNebs as needed

## 2023-12-03 NOTE — Sepsis Progress Note (Signed)
 Sepsis protocol monitored by eLink

## 2023-12-03 NOTE — ED Notes (Signed)
 Called lab to obtain second troponin

## 2023-12-03 NOTE — ED Provider Notes (Signed)
 [facility] Provider Note    Event Date/Time   First MD Initiated Contact with Patient 12/03/23 1510     (approximate)   History   Seizures   HPI  Andrea Bell is a 68 y.o. female with a history of hypertension, hyperlipidemia, hypokalemia, seizure disorder, and COPD who presents with seizure, fever, and altered mental status.  The patient is unable to give any history.  The husband reports that she had a seizure 2 nights ago.  Since that time she has remained altered, weak, and tremulous.  He states that last night during the whole night she was shaking although was able to speak somewhat and mumble.  She has been weak and confused.  I reviewed the past medical records.  The patient's most recent outpatient encounter was with family medicine on 9/8 for follow-up of her chronic conditions.   Physical Exam   Triage Vital Signs: ED Triage Vitals  Encounter Vitals Group     BP 12/03/23 1431 (!) 159/72     Girls Systolic BP Percentile --      Girls Diastolic BP Percentile --      Boys Systolic BP Percentile --      Boys Diastolic BP Percentile --      Pulse Rate 12/03/23 1431 (!) 115     Resp 12/03/23 1431 16     Temp 12/03/23 1431 (!) 101.8 F (38.8 C)     Temp src --      SpO2 12/03/23 1431 97 %     Weight 12/03/23 1432 146 lb 6.2 oz (66.4 kg)     Height 12/03/23 1432 5' 7 (1.702 m)     Head Circumference --      Peak Flow --      Pain Score 12/03/23 1502 10     Pain Loc --      Pain Education --      Exclude from Growth Chart --     Most recent vital signs: Vitals:   12/03/23 2011 12/03/23 2130  BP: 136/62 132/66  Pulse: (!) 102 (!) 104  Resp: 20 20  Temp:  100 F (37.8 C)  SpO2: 100% 98%     General: Awake, confused, ill-appearing. CV:  Good peripheral perfusion.  Tachycardic. Resp:  Normal effort.  Lungs CTAB. Abd:  No distention.  Other:  PERRLA.  Extraocular movements difficult to determine due to the patient's inability to  follow most commands.  Motor intact in all extremities.  Diffuse shaking/rigors, however the patient is able to make purposeful movement such as pulling up her blanket or squeezing hands.   ED Results / Procedures / Treatments   Labs (all labs ordered are listed, but only abnormal results are displayed) Labs Reviewed  COMPREHENSIVE METABOLIC PANEL WITH GFR - Abnormal; Notable for the following components:      Result Value   Potassium 3.4 (*)    Glucose, Bld 137 (*)    Total Protein 8.3 (*)    AST 101 (*)    Alkaline Phosphatase 155 (*)    Total Bilirubin 2.7 (*)    All other components within normal limits  CBC WITH DIFFERENTIAL/PLATELET - Abnormal; Notable for the following components:   RBC 3.52 (*)    Hemoglobin 11.0 (*)    HCT 34.1 (*)    RDW 15.6 (*)    Platelets 86 (*)    nRBC 1.0 (*)    Lymphs Abs 0.5 (*)    All other components  within normal limits  URINALYSIS, W/ REFLEX TO CULTURE (INFECTION SUSPECTED) - Abnormal; Notable for the following components:   Color, Urine YELLOW (*)    APPearance CLEAR (*)    Hgb urine dipstick SMALL (*)    Ketones, ur 20 (*)    Protein, ur 30 (*)    Bacteria, UA RARE (*)    All other components within normal limits  PROTIME-INR - Abnormal; Notable for the following components:   Prothrombin Time 16.5 (*)    INR 1.3 (*)    All other components within normal limits  CSF CELL COUNT WITH DIFFERENTIAL - Abnormal; Notable for the following components:   Color, CSF CLEAR (*)    Appearance, CSF COLORLESS (*)    All other components within normal limits  CSF CELL COUNT WITH DIFFERENTIAL - Abnormal; Notable for the following components:   Color, CSF CLEAR (*)    Appearance, CSF COLORLESS (*)    WBC, CSF 7 (*)    All other components within normal limits  PROTEIN AND GLUCOSE, CSF - Abnormal; Notable for the following components:   Glucose, CSF 78 (*)    All other components within normal limits  CBC WITH DIFFERENTIAL/PLATELET - Abnormal;  Notable for the following components:   RBC 3.25 (*)    Hemoglobin 10.3 (*)    HCT 31.5 (*)    RDW 15.7 (*)    Platelets 72 (*)    nRBC 0.6 (*)    All other components within normal limits  TROPONIN I (HIGH SENSITIVITY) - Abnormal; Notable for the following components:   Troponin I (High Sensitivity) 46 (*)    All other components within normal limits  TROPONIN I (HIGH SENSITIVITY) - Abnormal; Notable for the following components:   Troponin I (High Sensitivity) 53 (*)    All other components within normal limits  RESP PANEL BY RT-PCR (RSV, FLU A&B, COVID)  RVPGX2  CSF CULTURE W GRAM STAIN  CULTURE, BLOOD (ROUTINE X 2)  CULTURE, BLOOD (ROUTINE X 2)  RESPIRATORY PANEL BY PCR  LACTIC ACID, PLASMA  APTT  PROCALCITONIN  AMMONIA  URINE DRUG SCREEN, QUALITATIVE ([facility] ONLY)  LACTATE DEHYDROGENASE  D-DIMER, QUANTITATIVE  ACETAMINOPHEN  LEVEL  FIBRINOGEN  SALICYLATE LEVEL  ADAMTS13 ACTIVITY  HIV ANTIBODY (ROUTINE TESTING W REFLEX)  PROTIME-INR  COMPREHENSIVE METABOLIC PANEL WITH GFR  CBC  APTT  TYPE AND SCREEN     EKG     RADIOLOGY  Chest x-ray: I independently viewed and interpreted the images; there is no focal consolidation or edema  CT head: No ICH or other acute abnormality  PROCEDURES:  Critical Care performed: Yes, see critical care procedure note(s)  .Critical Care  Performed by: Jacolyn Pae, MD Authorized by: Jacolyn Pae, MD   Critical care provider statement:    Critical care time (minutes):  30   Critical care time was exclusive of:  Separately billable procedures and treating other patients   Critical care was necessary to treat or prevent imminent or life-threatening deterioration of the following conditions:  Sepsis and CNS failure or compromise   Critical care was time spent personally by me on the following activities:  Development of treatment plan with patient or surrogate, discussions with consultants, evaluation of patient's  response to treatment, examination of patient, ordering and review of laboratory studies, ordering and review of radiographic studies, ordering and performing treatments and interventions, pulse oximetry, re-evaluation of patient's condition and review of old charts Lumbar Puncture  Date/Time: 12/03/2023 10:30 PM  Performed by:  Jacolyn Pae, MD Authorized by: Jacolyn Pae, MD   Consent:    Consent obtained:  Verbal   Consent given by:  Spouse   Risks, benefits, and alternatives were discussed: yes     Risks discussed:  Bleeding, infection, pain, headache, nerve damage and repeat procedure   Alternatives discussed:  Observation Universal protocol:    Patient identity confirmed:  Arm band Procedure details:    Lumbar space:  L3-L4 interspace   Patient position:  L lateral decubitus   Number of attempts:  2   Fluid appearance:  Clear   Tubes of fluid:  4 Post-procedure details:    Puncture site:  Adhesive bandage applied and direct pressure applied   Procedure completion:  Tolerated well, no immediate complications    MEDICATIONS ORDERED IN ED: Medications  acetaminophen  (OFIRMEV ) IV 1,000 mg (0 mg Intravenous Stopped 12/03/23 2128)  levETIRAcetam  (KEPPRA ) undiluted injection 500 mg (has no administration in time range)  LORazepam  (ATIVAN ) injection 1 mg (has no administration in time range)  albuterol  (PROVENTIL ) (2.5 MG/3ML) 0.083% nebulizer solution 2.5 mg (has no administration in time range)  lactated ringers  infusion (has no administration in time range)  metroNIDAZOLE (FLAGYL) IVPB 500 mg (has no administration in time range)  acetaminophen  (TYLENOL ) tablet 650 mg (has no administration in time range)    Or  acetaminophen  (TYLENOL ) suppository 650 mg (has no administration in time range)  ondansetron  (ZOFRAN ) tablet 4 mg (has no administration in time range)    Or  ondansetron  (ZOFRAN ) injection 4 mg (has no administration in time range)  vancomycin  (VANCOREADY) IVPB 500 mg/100 mL (has no administration in time range)  ceFEPIme (MAXIPIME) 2 g in sodium chloride  0.9 % 100 mL IVPB (has no administration in time range)  acetaminophen  (TYLENOL ) tablet 650 mg (650 mg Oral Given 12/03/23 1502)  lactated ringers  bolus 1,000 mL (0 mLs Intravenous Stopped 12/03/23 1822)  ceFEPIme (MAXIPIME) 2 g in sodium chloride  0.9 % 100 mL IVPB (0 g Intravenous Stopped 12/03/23 1636)  metroNIDAZOLE (FLAGYL) IVPB 500 mg (0 mg Intravenous Stopped 12/03/23 1822)  vancomycin (VANCOCIN) IVPB 1000 mg/200 mL premix (0 mg Intravenous Stopped 12/03/23 2001)  midazolam  (VERSED ) injection 2 mg (2 mg Intravenous Given 12/03/23 1831)     IMPRESSION / MDM / ASSESSMENT AND PLAN / ED COURSE  I reviewed the triage vital signs and the nursing notes.  68 year old female with PMH as noted above presents with altered mental status and fever.  Per the husband, she had a seizure 2 nights ago and since that time has been weak, shaky, confused.  On exam the patient is awake but very confused, only able to say okay and follow some basic commands.  She is febrile and tachycardic.  She is having active rigors with diffuse shaking, but no seizure-like activity and is able to make purposeful movements.  She also had a bowel movement which was dark and somewhat tarry appearing, concerning for GI bleed.  Differential diagnosis includes, but is not limited to, acute infection/sepsis, UTI, pneumonia, COVID, other viral syndrome, less likely meningitis/encephalitis.  We will obtain chest x-ray, CT head, lab workup, give fluids, empiric antibiotics, antipyretic, and reassess.  Patient's presentation is most consistent with acute presentation with potential threat to life or bodily function.  The patient is on the cardiac monitor to evaluate for evidence of arrhythmia and/or significant heart rate changes.   ----------------------------------------- 8:23 PM on  12/03/2023 -----------------------------------------  CT head was negative for acute findings.  Chest x-ray  shows no evidence of pneumonia.  Urinalysis is also negative.  CBC shows no leukocytosis.  CMP shows elevated alkaline phosphatase and bilirubin which appear chronic.  Troponin is minimally elevated.  Given the patient's fever and AMS, meningitis/encephalitis is on the differential.  I contacted the patient's husband by phone and obtained verbal consent for lumbar puncture.  The patient has no contraindications.  LP was performed without difficulty and the patient tolerated the procedure well.  CSF has been sent off.  The patient has already received broad-spectrum antibiotics.  I consulted Dr. Cleatus from the hospitalist service; based on our discussion she agrees to evaluate patient for admission.   FINAL CLINICAL IMPRESSION(S) / ED DIAGNOSES   Final diagnoses:  Sepsis, due to unspecified organism, unspecified whether acute organ dysfunction present (HCC)  Febrile illness  Altered mental status, unspecified altered mental status type     Rx / DC Orders   ED Discharge Orders     None        Note:  This document was prepared using Dragon voice recognition software and may include unintentional dictation errors.    Jacolyn Pae, MD 12/03/23 2234

## 2023-12-04 ENCOUNTER — Inpatient Hospital Stay

## 2023-12-04 ENCOUNTER — Encounter: Payer: Self-pay | Admitting: Osteopathic Medicine

## 2023-12-04 DIAGNOSIS — A419 Sepsis, unspecified organism: Secondary | ICD-10-CM | POA: Diagnosis not present

## 2023-12-04 DIAGNOSIS — G40802 Other epilepsy, not intractable, without status epilepticus: Secondary | ICD-10-CM | POA: Diagnosis not present

## 2023-12-04 LAB — LACTATE DEHYDROGENASE: LDH: 292 U/L — ABNORMAL HIGH (ref 98–192)

## 2023-12-04 LAB — COMPREHENSIVE METABOLIC PANEL WITH GFR
ALT: 29 U/L (ref 0–44)
AST: 107 U/L — ABNORMAL HIGH (ref 15–41)
Albumin: 3.2 g/dL — ABNORMAL LOW (ref 3.5–5.0)
Alkaline Phosphatase: 113 U/L (ref 38–126)
Anion gap: 11 (ref 5–15)
BUN: 21 mg/dL (ref 8–23)
CO2: 22 mmol/L (ref 22–32)
Calcium: 9.6 mg/dL (ref 8.9–10.3)
Chloride: 104 mmol/L (ref 98–111)
Creatinine, Ser: 0.61 mg/dL (ref 0.44–1.00)
GFR, Estimated: >60 mL/min (ref >60)
Glucose, Bld: 105 mg/dL — ABNORMAL HIGH (ref 70–99)
Potassium: 2.9 mmol/L — ABNORMAL LOW (ref 3.5–5.1)
Sodium: 137 mmol/L (ref 135–145)
Total Bilirubin: 2.1 mg/dL — ABNORMAL HIGH (ref 0.0–1.2)
Total Protein: 6.7 g/dL (ref 6.5–8.1)

## 2023-12-04 LAB — CBC
HCT: 32.1 % — ABNORMAL LOW (ref 36.0–46.0)
Hemoglobin: 10.4 g/dL — ABNORMAL LOW (ref 12.0–15.0)
MCH: 31.4 pg (ref 26.0–34.0)
MCHC: 32.4 g/dL (ref 30.0–36.0)
MCV: 97 fL (ref 80.0–100.0)
Platelets: 64 K/uL — ABNORMAL LOW (ref 150–400)
RBC: 3.31 MIL/uL — ABNORMAL LOW (ref 3.87–5.11)
RDW: 15.7 % — ABNORMAL HIGH (ref 11.5–15.5)
WBC: 6.5 K/uL (ref 4.0–10.5)
nRBC: 0 % (ref 0.0–0.2)

## 2023-12-04 LAB — MAGNESIUM: Magnesium: 1.2 mg/dL — ABNORMAL LOW (ref 1.7–2.4)

## 2023-12-04 LAB — RESPIRATORY PANEL BY PCR

## 2023-12-04 LAB — APTT: aPTT: 36 s (ref 24–36)

## 2023-12-04 LAB — AMMONIA: Ammonia: 47 umol/L — ABNORMAL HIGH (ref 9–35)

## 2023-12-04 LAB — PROTIME-INR
INR: 1.5 — ABNORMAL HIGH (ref 0.8–1.2)
Prothrombin Time: 18.6 s — ABNORMAL HIGH (ref 11.4–15.2)

## 2023-12-04 LAB — HIV ANTIBODY (ROUTINE TESTING W REFLEX): HIV Screen 4th Generation wRfx: NONREACTIVE

## 2023-12-04 LAB — FIBRINOGEN: Fibrinogen: 409 mg/dL (ref 210–475)

## 2023-12-04 MED ORDER — VANCOMYCIN HCL 1500 MG/300ML IV SOLN
1500.0000 mg | INTRAVENOUS | Status: DC
Start: 2023-12-05 — End: 2023-12-04

## 2023-12-04 MED ORDER — GADOBUTROL 1 MMOL/ML IV SOLN
6.0000 mL | Freq: Once | INTRAVENOUS | Status: AC | PRN
  Administered 2023-12-04: 6 mL via INTRAVENOUS

## 2023-12-04 MED ORDER — LACTULOSE 10 GM/15ML PO SOLN
20.0000 g | Freq: Every day | ORAL | Status: DC
  Administered 2023-12-06 – 2023-12-09 (×4): 20 g via ORAL
  Filled 2023-12-04 (×5): qty 30

## 2023-12-04 MED ORDER — MAGNESIUM SULFATE 2 GM/50ML IV SOLN
2.0000 g | Freq: Once | INTRAVENOUS | Status: AC
  Administered 2023-12-04: 2 g via INTRAVENOUS
  Filled 2023-12-04: qty 50

## 2023-12-04 MED ORDER — POTASSIUM CHLORIDE 10 MEQ/100ML IV SOLN
10.0000 meq | INTRAVENOUS | Status: AC
  Administered 2023-12-04 (×4): 10 meq via INTRAVENOUS
  Filled 2023-12-04 (×4): qty 100

## 2023-12-04 MED ORDER — THIAMINE HCL 100 MG/ML IJ SOLN
100.0000 mg | INTRAMUSCULAR | Status: DC
  Administered 2023-12-04 – 2023-12-05 (×2): 100 mg via INTRAVENOUS
  Filled 2023-12-04 (×2): qty 2

## 2023-12-04 MED ORDER — LEVETIRACETAM (KEPPRA) 500 MG/5 ML ADULT IV PUSH
1500.0000 mg | Freq: Two times a day (BID) | INTRAVENOUS | Status: AC
Start: 2023-12-04 — End: ?
  Administered 2023-12-04 – 2023-12-06 (×4): 1500 mg via INTRAVENOUS
  Filled 2023-12-04 (×5): qty 15

## 2023-12-04 MED ORDER — LEVETIRACETAM (KEPPRA) 500 MG/5 ML ADULT IV PUSH
1000.0000 mg | Freq: Once | INTRAVENOUS | Status: AC
  Administered 2023-12-04: 1000 mg via INTRAVENOUS
  Filled 2023-12-04: qty 10

## 2023-12-04 MED ORDER — LORAZEPAM 2 MG/ML IJ SOLN
1.0000 mg | INTRAMUSCULAR | Status: AC | PRN
  Administered 2023-12-06: 2 mg via INTRAVENOUS
  Filled 2023-12-04: qty 1
  Filled 2023-12-04: qty 2

## 2023-12-04 MED ORDER — LORAZEPAM 1 MG PO TABS
1.0000 mg | ORAL_TABLET | ORAL | Status: AC | PRN
  Filled 2023-12-04: qty 1

## 2023-12-04 MED ORDER — LACTATED RINGERS IV SOLN: INTRAVENOUS | Status: AC

## 2023-12-04 MED ORDER — SODIUM CHLORIDE 0.9 % IV SOLN
2.0000 g | Freq: Every day | INTRAVENOUS | Status: DC
  Administered 2023-12-04 – 2023-12-06 (×3): 2 g via INTRAVENOUS
  Filled 2023-12-04 (×3): qty 20

## 2023-12-04 NOTE — ED Notes (Addendum)
 The pt is a 68 yof lying supine in the bed alert to her name and some questions. The pt was warm, pink, and dry. The pt appears altered and groggy. The pt vitals were assessed. Purwick placed back on suction. Call bell placed at bedside within pt reach.

## 2023-12-04 NOTE — Progress Notes (Signed)
 Eeg done

## 2023-12-04 NOTE — Progress Notes (Signed)
 PROGRESS NOTE    Andrea Bell   FMW:[id number] DOB: 07-09-1955  DOA: 12/03/2023 Date of Service: 12/04/23 which is hospital day 1  PCP: Lora Odor, [facility] course / significant events:   HPI:  Andrea Bell is a 68 y.o. 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 History from husband is that following the seizure she has been confused, weak, shaking and mumbling which is far from her baseline   10/14: to ED. Fever 102.7, HR 108, lumbar puncture performed, CT head non-acute, started on sepsis fluids and broad spectrum abx, admitted for SIRS/Sepsis FUO. CT abd/pelv (+)hepatic steatosis/cirrhosis and ammonia slightly up, large stool burden likely impacted, procal up,  10/15: still npo pending swallow eval, if able to take lactulose will administer this otherwise will need enema / disimpaction. EEG pending. Remains encephalopathic but responsive, follows commands.      Consultants:  none  Procedures/Surgeries: Lumbar puncture in ED      ASSESSMENT & PLAN:   Sepsis, unknown source Question SIRS without infection   Fever unknown origin  Sepsis criteria include fever, tachycardia albeit with normal WBC and lactic acid Neg Flu/COVID/RSV. UA and CXR, head CT all unrevealing Procal elevated --> supports abx  Underwent LP in the ED, CSF studies pending---> appearing sterile so far Continue broad-spectrum antibiotics to treat sepsis of unknown source   Acute metabolic encephalopathy Suspect secondary to sepsis in the setting of seizure a couple days prior-doubt prolonged postictal. Question component of hepatic encephalopathy. Question component of vascular dementia acute worsening based on CT head images w/ ...mild cerebral atrophy with widening of  the extra-axial spaces and ventricular dilatation. There are areas of decreased attenuation within the white matter tracts of the supratentorial brain, consistent with microvascular disease changes n.p.o. pending swallow eval may need SLP consult  Neurologic checks with fall and aspiration precautions   Thrombocytopenia Platelets 86,000 10/14 down from baseline of 138,000 --> 64000 today 10/15 Likely secondary to severe sepsis complicated by cirrhosis,  In the absence of leukocytosis and lactic acid we will explore other potential etiologies, rule out DIC Hematologic workup --> normal smear, elevated LDH, elevated D-dimer, normal fibrinogen    Elevated D-Dimer CT angio chest   Moderate rectal stool burden, suggesting mild fecal impaction. Early stercoral colitis is possible. 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  Underweight - under 18  overweight - 25 to 29 obese - 30 or more Class 1 obesity: BMI of 30.0 to 34 Class 2 obesity: BMI of 35.0 to 39 Class 3 obesity: BMI of 40.0 to 49 Super Morbid Obesity: BMI 50-59 Super-super Morbid Obesity: BMI 60+ Healthy nutrition and  physical activity advised as adjunct to other disease management and risk reduction  treatments    DVT prophylaxis: hold Rx d/t low Plt  IV fluids: LR 100 cc/h continuous IV fluids  Nutrition: pending swallow eval  Central lines / other devices: none  Code Status: FULL CODE ACP documentation reviewed: none on file in [facility]  TOC needs: TBD Medical barriers to dispo: encephalopathic, needing abx. Expected medical readiness for discharge pend clinical course, at least several days.              Subjective / Brief ROS:  Patient reports no complaints - she awakens to voice and answers questions but is difficult to understand. She denies pain. She follows commands see physical exam.    Family Communication: will call family later today     Objective Findings:  Vitals:   12/04/23 1000 12/04/23 1153 12/04/23 1205 12/04/23 1245  BP: 125/64     Pulse: 96     Resp: 15     Temp:   99.9 F (37.7 C) 100 F (37.8 C)  TempSrc:   Oral Oral  SpO2: 99% 100%    Weight:      Height:        Intake/Output Summary (Last 24 hours) at 12/04/2023 1525 Last data filed at 12/04/2023 1311 Gross per 24 hour  Intake 700 ml  Output --  Net 700 ml   Filed Weights   12/03/23 1432  Weight: 66.4 kg    Examination:  Physical Exam Constitutional:      General: She is not in acute distress.    Appearance: She is ill-appearing.  Cardiovascular:     Rate and Rhythm: Normal rate and regular rhythm.  Pulmonary:     Effort: Pulmonary effort is normal.     Breath sounds: Normal breath sounds.  Abdominal:     General: Bowel sounds are normal. There is distension.     Palpations: Abdomen is soft.     Tenderness: There is no guarding or rebound.  Musculoskeletal:     Right lower leg: No edema.     Left lower leg: No edema.  Skin:    General: Skin is warm and dry.  Neurological:     Mental Status: She is alert. She is disoriented.  Psychiatric:        Behavior: Behavior normal.          Scheduled Medications:   lactulose  20 g Oral Daily   levETIRAcetam    1,500 mg Intravenous Q12H    Continuous Infusions:  acetaminophen  Stopped (12/04/23 1311)   cefTRIAXone (ROCEPHIN)  IV     lactated ringers      potassium chloride       PRN Medications:  acetaminophen  **OR** acetaminophen , albuterol , LORazepam , ondansetron  **OR** ondansetron  (ZOFRAN ) IV  Antimicrobials from admission:  Anti-infectives (From admission, onward)    Start     Dose/Rate Route Frequency Ordered Stop   12/05/23 0800  vancomycin (VANCOREADY) IVPB 1500 mg/300 mL  Status:  Discontinued        1,500 mg 150 mL/hr over 120 Minutes Intravenous Every 24 hours 12/04/23 1015 12/04/23 1017   12/04/23 1800  cefTRIAXone (ROCEPHIN) 2 g in sodium chloride  0.9 % 100 mL IVPB        2 g 200 mL/hr over 30 Minutes Intravenous Daily-1800 12/04/23 1018     12/04/23 0800  vancomycin (VANCOREADY) IVPB 750 mg/150 mL  Status:  Discontinued        750 mg 150 mL/hr over 60  Minutes Intravenous Every 12 hours 12/03/23 2339 12/04/23 1015   12/04/23 0600  metroNIDAZOLE (FLAGYL) IVPB 500 mg  Status:  Discontinued        500 mg 100 mL/hr over 60 Minutes Intravenous Every 12 hours 12/03/23 2155 12/04/23 1017   12/04/23 0000  ceFEPIme (MAXIPIME) 2 g in sodium chloride  0.9 % 100 mL IVPB  Status:  Discontinued        2 g 200 mL/hr over 30 Minutes Intravenous Every 8 hours 12/03/23 2226 12/04/23 1017   12/03/23 2230  vancomycin (VANCOREADY) IVPB 500 mg/100 mL        500 mg 100 mL/hr over 60 Minutes Intravenous  Once 12/03/23 2224 12/04/23 0348   12/03/23 2200  vancomycin (VANCOCIN) IVPB 1000 mg/200 mL premix  Status:  Discontinued        1,000 mg 200 mL/hr over 60 Minutes Intravenous  Once 12/03/23 2155 12/03/23 2224   12/03/23 1530  ceFEPIme (MAXIPIME) 2 g in sodium chloride  0.9 % 100 mL IVPB        2 g 200 mL/hr over 30 Minutes Intravenous  Once 12/03/23 1525 12/03/23 1636   12/03/23 1530  metroNIDAZOLE (FLAGYL) IVPB 500 mg        500 mg 100 mL/hr over 60 Minutes Intravenous  Once 12/03/23 1525  12/03/23 1822   12/03/23 1530  vancomycin (VANCOCIN) IVPB 1000 mg/200 mL premix        1,000 mg 200 mL/hr over 60 Minutes Intravenous  Once 12/03/23 1525 12/03/23 2001           Data Reviewed:  I have personally reviewed the following...  CBC: Recent Labs  Lab 12/03/23 1439 12/03/23 2009 12/04/23 0520  WBC 7.1 7.1 6.5  NEUTROABS 5.9 5.4  --   HGB 11.0* 10.3* 10.4*  HCT 34.1* 31.5* 32.1*  MCV 96.9 96.9 97.0  PLT 86* 72* 64*   Basic Metabolic Panel: Recent Labs  Lab 12/03/23 1439 12/04/23 0555  NA 136 137  K 3.4* 2.9*  CL 99 104  CO2 22 22  GLUCOSE 137* 105*  BUN 23 21  CREATININE 0.74 0.61  CALCIUM  10.3 9.6   GFR: Estimated Creatinine Clearance: 66.4 mL/min (by C-G formula based on SCr of 0.61 mg/dL). Liver Function Tests: Recent Labs  Lab 12/03/23 1439 12/04/23 0555  AST 101* 107*  ALT 25 29  ALKPHOS 155* 113  BILITOT 2.7* 2.1*  PROT 8.3* 6.7  ALBUMIN 4.5 3.2*   No results for input(s): LIPASE, AMYLASE in the last 168 hours. Recent Labs  Lab 12/03/23 2125  AMMONIA 46*   Coagulation Profile: Recent Labs  Lab 12/03/23 1439 12/04/23 0520  INR 1.3* 1.5*   Cardiac Enzymes: No results for input(s): CKTOTAL, CKMB, CKMBINDEX, TROPONINI in the last 168 hours. BNP (last 3 results) No results for input(s): PROBNP in the last 8760 hours. HbA1C: No results for input(s): HGBA1C in the last 72 hours. CBG: No results for input(s): GLUCAP in the last 168 hours. Lipid Profile: No results for input(s): CHOL, HDL, LDLCALC, TRIG, CHOLHDL, LDLDIRECT in the last 72 hours. Thyroid  Function Tests: No results for input(s): TSH, T4TOTAL, FREET4, T3FREE, THYROIDAB in the last 72 hours. Anemia Panel: No results for input(s): VITAMINB12, FOLATE, FERRITIN, TIBC, IRON , RETICCTPCT in the last 72 hours. Most Recent Urinalysis On File:     Component Value Date/Time   COLORURINE YELLOW (A) 12/03/2023 1436    APPEARANCEUR CLEAR (A) 12/03/2023 1436   APPEARANCEUR Clear 02/05/2013 1159   LABSPEC 1.016 12/03/2023  1436   LABSPEC 1.024 02/05/2013 1159   PHURINE 8.0 12/03/2023 1436   GLUCOSEU NEGATIVE 12/03/2023 1436   GLUCOSEU Negative 02/05/2013 1159   HGBUR SMALL (A) 12/03/2023 1436   BILIRUBINUR NEGATIVE 12/03/2023 1436   BILIRUBINUR Negative 02/05/2013 1159   KETONESUR 20 (A) 12/03/2023 1436   PROTEINUR 30 (A) 12/03/2023 1436   NITRITE NEGATIVE 12/03/2023 1436   LEUKOCYTESUR NEGATIVE 12/03/2023 1436   LEUKOCYTESUR Negative 02/05/2013 1159   Sepsis Labs: @LABRCNTIP (procalcitonin:4,lacticidven:4) Microbiology: Recent Results (from the past 240 hours)  Blood culture (routine x 2)     Status: None (Preliminary result)   Collection Time: 12/03/23  2:39 PM   Specimen: BLOOD  Result Value Ref Range Status   Specimen Description BLOOD RIGHT ANTECUBITAL  Final   Special Requests   Final    BOTTLES DRAWN AEROBIC AND ANAEROBIC Blood Culture results may not be optimal due to an inadequate volume of blood received in culture bottles   Culture   Final    NO GROWTH < 24 HOURS Performed at [facility], [address], [place], [place] [address]    Report Status PENDING  Incomplete  Resp panel by RT-PCR (RSV, Flu A&B, Covid) Anterior Nasal Swab     Status: None   Collection Time: 12/03/23  2:39 PM   Specimen: Anterior Nasal Swab  Result Value Ref Range Status   SARS Coronavirus 2 by RT PCR NEGATIVE NEGATIVE Final    Comment: (NOTE) SARS-CoV-2 target nucleic acids are NOT DETECTED.  The SARS-CoV-2 RNA is generally detectable in upper respiratory specimens during the acute phase of infection. The lowest concentration of SARS-CoV-2 viral copies this assay can detect is 138 copies/mL. A negative result does not preclude SARS-Cov-2 infection and should not be used as the sole basis for treatment or other patient management decisions. A negative result may occur with  improper specimen  collection/handling, submission of specimen other than nasopharyngeal swab, presence of viral mutation(s) within the areas targeted by this assay, and inadequate number of viral copies(<138 copies/mL). A negative result must be combined with clinical observations, patient history, and epidemiological information. The expected result is Negative.  Fact Sheet for Patients:  BloggerCourse.com  Fact Sheet for Healthcare Providers:  SeriousBroker.it  This test is no t yet approved or cleared by the [place]  FDA and  has been authorized for detection and/or diagnosis of SARS-CoV-2 by FDA under an Emergency Use Authorization (EUA). This EUA will remain  in effect (meaning this test can be used) for the duration of the COVID-19 declaration under Section 564(b)(1) of the Act, 21 U.S.C.section 360bbb-3(b)(1), unless the authorization is terminated  or revoked sooner.       Influenza A by PCR NEGATIVE NEGATIVE Final   Influenza B by PCR NEGATIVE NEGATIVE Final    Comment: (NOTE) The Xpert Xpress SARS-CoV-2/FLU/RSV plus assay is intended as an aid in the diagnosis of influenza from Nasopharyngeal swab specimens and should not be used as a sole basis for treatment. Nasal washings and aspirates are unacceptable for Xpert Xpress SARS-CoV-2/FLU/RSV testing.  Fact Sheet for Patients: BloggerCourse.com  Fact Sheet for Healthcare Providers: SeriousBroker.it  This test is not yet approved or cleared by the [place]  FDA and has been authorized for detection and/or diagnosis of SARS-CoV-2 by FDA under an Emergency Use Authorization (EUA). This EUA will remain in effect (meaning this test can be used) for the duration of the COVID-19 declaration under Section 564(b)(1) of the Act, 21 U.S.C. section 360bbb-3(b)(1), unless the  authorization is terminated or revoked.     Resp Syncytial  Virus by PCR NEGATIVE NEGATIVE Final    Comment: (NOTE) Fact Sheet for Patients: BloggerCourse.com  Fact Sheet for Healthcare Providers: SeriousBroker.it  This test is not yet approved or cleared by the [place]  FDA and has been authorized for detection and/or diagnosis of SARS-CoV-2 by FDA under an Emergency Use Authorization (EUA). This EUA will remain in effect (meaning this test can be used) for the duration of the COVID-19 declaration under Section 564(b)(1) of the Act, 21 U.S.C. section 360bbb-3(b)(1), unless the authorization is terminated or revoked.  Performed at [facility], [address] [place], [place] [address]   Blood culture (routine x 2)     Status: None (Preliminary result)   Collection Time: 12/03/23  3:05 PM   Specimen: BLOOD  Result Value Ref Range Status   Specimen Description BLOOD BLOOD RIGHT HAND  Final   Special Requests   Final    BOTTLES DRAWN AEROBIC AND ANAEROBIC Blood Culture results may not be optimal due to an inadequate volume of blood received in culture bottles   Culture   Final    NO GROWTH < 24 HOURS Performed at [facility], [address] [place], [place] [address]    Report Status PENDING  Incomplete  CSF culture w Gram Stain     Status: None (Preliminary result)   Collection Time: 12/03/23  6:56 PM   Specimen: CSF; Cerebrospinal Fluid  Result Value Ref Range Status   Specimen Description   Final    CSF Performed at [facility], [address] [place], [place] [address]    Special Requests   Final    NONE Performed at [facility], [address] [place], [place] [address]    Gram Stain   Final    NO ORGANISMS SEEN WBC SEEN RED BLOOD CELLS PRESENT GRAM STAIN REVIEWED-AGREE WITH RESULT [id number] AT 0029, ADC    Culture   Final    NO GROWTH < 12 HOURS Performed at [facility], [address], [place], [place] [address]     Report Status PENDING  Incomplete  Respiratory (~20 pathogens) panel by PCR     Status: None   Collection Time: 12/04/23 12:18 AM   Specimen: Nasopharyngeal Swab; Respiratory  Result Value Ref Range Status   Adenovirus NOT DETECTED NOT DETECTED Final   Coronavirus 229E NOT DETECTED NOT DETECTED Final    Comment: (NOTE) The Coronavirus on the Respiratory Panel, DOES NOT test for the novel  Coronavirus (2019 nCoV)    Coronavirus HKU1 NOT DETECTED NOT DETECTED Final   Coronavirus NL63 NOT DETECTED NOT DETECTED Final   Coronavirus OC43 NOT DETECTED NOT DETECTED Final   Metapneumovirus NOT DETECTED NOT DETECTED Final   Rhinovirus / Enterovirus NOT DETECTED NOT DETECTED Final   Influenza A NOT DETECTED NOT DETECTED Final   Influenza B NOT DETECTED NOT DETECTED Final   Parainfluenza Virus 1 NOT DETECTED NOT DETECTED Final   Parainfluenza Virus 2 NOT DETECTED NOT DETECTED Final   Parainfluenza Virus 3 NOT DETECTED NOT DETECTED Final   Parainfluenza Virus 4 NOT DETECTED NOT DETECTED Final   Respiratory Syncytial Virus NOT DETECTED NOT DETECTED Final   Bordetella pertussis NOT DETECTED NOT DETECTED Final   Bordetella Parapertussis NOT DETECTED NOT DETECTED Final   Chlamydophila pneumoniae NOT DETECTED NOT DETECTED Final   Mycoplasma pneumoniae NOT DETECTED NOT DETECTED Final    Comment: Performed at [facility], [address] [place], [place] [address]      Radiology Studies last 3 days: CT ABDOMEN PELVIS WO CONTRAST Result Date: 12/03/2023 EXAM: CT ABDOMEN AND PELVIS WITHOUT CONTRAST 12/03/2023 09:48:47 PM TECHNIQUE: CT of the abdomen and pelvis was performed without the administration of intravenous contrast. Multiplanar reformatted images are provided for review. Automated exposure control, iterative reconstruction, and/or weight-based adjustment of the mA/kV was utilized to reduce the radiation dose to as low as reasonably achievable. COMPARISON: PET CT dated 06/24/2019. CLINICAL  HISTORY: Sepsis. FINDINGS: LOWER CHEST: No acute abnormality. LIVER: Cirrhosis. Hepatic steatosis. GALLBLADDER AND BILE DUCTS: Gallbladder is unremarkable. No biliary ductal dilatation. SPLEEN: No acute abnormality. PANCREAS: No acute abnormality. ADRENAL GLANDS: No acute abnormality. KIDNEYS, URETERS AND BLADDER: No stones in the kidneys or ureters. No hydronephrosis. No perinephric or periureteral stranding. Urinary bladder is unremarkable. GI AND BOWEL: Stomach demonstrates no acute abnormality. Moderate rectal stool burden, suggesting mild fecal impaction. Early stercoral colitis is possible. The appendix is not discretely visualized. There is no bowel obstruction. PERITONEUM AND RETROPERITONEUM: No ascites. No free air. VASCULATURE: Aorta is normal in caliber. Atherosclerotic calcifications of the abdominal aorta and branch vessels. LYMPH NODES: No lymphadenopathy. REPRODUCTIVE ORGANS: Status post hysterectomy. BONES AND SOFT TISSUES: Mild degenerative changes of the lumbar spine. No acute osseous abnormality. No focal soft tissue abnormality. IMPRESSION: 1. Moderate rectal stool burden, suggesting mild fecal impaction. Early stercoral colitis is possible. 2. Cirrhosis and hepatic steatosis. Electronically signed by: Pinkie Pebbles MD 12/03/2023 09:55 PM EDT RP Workstation: [id number]   CT Head Wo Contrast Result Date: 12/03/2023 CLINICAL DATA:  Altered mental status. EXAM: CT HEAD WITHOUT CONTRAST TECHNIQUE: Contiguous axial images were obtained from the base of the skull through the vertex without intravenous contrast. RADIATION DOSE REDUCTION: This exam was performed according to the departmental dose-optimization program which includes automated exposure control, adjustment of the mA and/or kV according to patient size and/or use of iterative reconstruction technique. COMPARISON:  May 06, 2023 FINDINGS: Brain: There is mild cerebral atrophy with widening of the extra-axial spaces and ventricular  dilatation. There are areas of decreased attenuation within the white matter tracts of the supratentorial brain, consistent with microvascular disease changes. Vascular: No hyperdense vessel or unexpected calcification. Skull: Normal. Negative for fracture or focal lesion. Sinuses/Orbits: No acute finding. Other: None. IMPRESSION: No acute intracranial abnormality. Electronically Signed   By: Suzen Dials M.D.   On: 12/03/2023 15:52   DG Chest Portable 1 View Result Date: 12/03/2023 CLINICAL DATA:  fever EXAM: DG CHEST 1V PORT COMPARISON:  05/05/2023 FINDINGS: No focal airspace consolidation, pleural effusion, or pneumothorax. No cardiomegaly. Tortuous aorta with aortic atherosclerosis. No acute fracture or destructive lesions. Multilevel thoracic osteophytosis. Age-indeterminate fracture of the distal right clavicle. IMPRESSION: 1. No acute cardiopulmonary abnormality. 2. Age-indeterminate fracture of the distal right clavicle, likely chronic. Correlation with point tenderness recommended to assess for acuity. Electronically Signed   By: Rogelia Myers M.D.   On: 12/03/2023 15:24       Time spent: 50 min     Laneta Blunt, DO [facility] 12/04/2023, 3:25 PM    Dictation software may have been used to generate the above note. Typos may occur and escape review in typed/dictated notes. Please contact Dr Blunt directly for clarity if needed.  Staff may message me via secure chat in Epic  but this may not receive an immediate response,  please page me for urgent matters!  If 7PM-7AM, please contact night coverage  [email or web address]

## 2023-12-04 NOTE — ED Notes (Signed)
 Pt has black tar like stool present when her brief was changed. Pt cleaned without incident.

## 2023-12-04 NOTE — ED Notes (Signed)
 CALLED [facility] INFORMED BED READY

## 2023-12-04 NOTE — Hospital Course (Addendum)
 Hospital course / significant events:   HPI:  Andrea Bell is a 68 y.o. 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 History from husband is that following the seizure she has been confused, weak, shaking and mumbling which is far from her baseline   10/14: to ED. Fever 102.7, HR 108, lumbar puncture performed, CT head non-acute, started on sepsis fluids and broad spectrum abx, admitted for SIRS/Sepsis FUO. CT abd/pelv (+)hepatic steatosis/cirrhosis and ammonia slightly up, large stool burden likely impacted, procal up,  10/15: still npo pending swallow eval, if able to take lactulose will administer this otherwise will need enema / disimpaction. EEG pending. Remains encephalopathic but responsive, follows commands. Medic in ED reported dark stool but no obvious melena, pt is on iron  supplements, hemoccult ordered prn. Spoke w/ husband, she is a heavy drinker about 2 times per week, unsure last drink, question EtOH withdrawal to explain symptoms including seizure and fever, on CIWA. 10/16: ammonia slight increase. MRI brain no mass effect or new bleed, question old hemosiderin staining on L frontal prior subarachnoid hemorrhage may be seizure focus. SLP eval - significant cognitive deficits appear stable/similar compared to previous SLP eval 05/2023, minor aspiration risk mostly d/t cognition. Neurology consult - dc zongran, cont keppra , give thiamine  500 mg iv tid x 3d.  10/17: remains same/stable, husband reports she seems a lot better today. dc abx and monitor thru the weekend off abx. PT/OT to see, expect family will decide for home w/ home health depending on how pt does  10/18: hold abx, eval PE given persistent fever/tachycardia w/ no other infection found, if neg PE will ask ID to  weigh in. Much more cogent and talkative today and no complaints      Consultants:  none  Procedures/Surgeries: Lumbar puncture in ED      ASSESSMENT & PLAN:   Acute metabolic encephalopathy, multifactorial Awake but severely abulic patient with minimal speech output who is not able to cooperate with most components of the exam. No gross focal weakness appreciated.  Ddx:  sepsis but no obvious infectious source found Seizure but doubt prolonged postictal EtOH component / withdrawals / Wernicke's hepatic encephalopathy / hyperammonemia  Advancing vascular dementia. Appreciate neurology consult  Neurologic checks  fall and aspiration precautions See below treating individual potential cause(s)  SIRS/sepsis but no obvious infectious source found Sepsis, unknown source Question SIRS without infection   Fever, intermittent (most recent 102.4 10/17 23:51) Elevation procalcitonin (1.65 10/14 --> 1.3 10/18) Sepsis criteria include fever, tachycardia albeit with normal WBC and lactic acid. No obvious infectious source, have reliably r/o: UTI, meningitis, viral resp illness, intraabdominal abscess, pneumonia Question stercoral colitis on CT but pt has not c/o abd pain Of note, cirrhosis can also increase procalcitonin In absence of clinical signs/symptoms of infection, but (+)Ddimer, persistent elevation procalcitonin and fever, will get CTA chest to eval for PE but if this is negative will d/w infectious disease --> no PE  Hold abx for now pending discussion w/ ID Pt is clinically improved today / more verbal, and has no complaints that would point to infection  Message sent to ID on remote call over the weekend, acknowledged, they advised ask ID to see on Monday no other recs/orders at this time  Seizure hx in setting of previous SDH/SAH and TBI, but doubt prolonged postictal  Per neurology Regarding the possibility of subclinical seizures, she is prescribed Keppra  1500 mg BID and  Zonegran 100 mg BID, but she has uncertain medication adherence. Husband stated that she drinks heavily a couple of times per week and therefore EtOH withdrawal may be playing a role (hypoactive delirium). 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 Holding home Zonegran given hyperammoniemia, consider restart if/when ammonia levels normalize IF can confirm continued EtOH abstinence Keppra  1500 mg bid   EtOH use question withdrawal / Wernicke's Spoke w/ husband, she is a heavy drinker about 2 times per week, unsure last drink, question EtOH withdrawal to explain symptoms including seizure and fever  Per neurology: Wernicke's encephalopathy is also possible. Due to her inability to participate fully with the exam, cannot determine if she has a subtle deficit of ocular motility or ataxia. CIWA Thiamine  500 mg IV tid x3 days then ok to return to 100 mg daily  Hepatic encephalopathy Cirrhosis and hepatic steatosis Mild ammonia increase - worsening Holding home Zonegran given hyperammoniemia, consider restart if/when ammonia levels normalize IF can confirm continued EtOH abstinence Lactulose  Monitor ammonia  Of note, cirrhosis can also increase procalcitonin  Advancing vascular dementia. family history of dementia (mother). Therefore a possible underlying degenerative dementia could also be contributing to her symptoms and/or susceptibility to relatively minor insults causing AMS.  Supportive care    Cardiomegaly, with trace pericardial effusion  Has not complained of chest pain, pericarditis a consideration w/ unexplained fever but is not likely Echocardiogram  Thrombocytopenia Platelets 86,000 10/14 down from baseline of 138,000 --> 64000 10/15 -->  84000  Likely secondary to severe sepsis complicated by cirrhosis,  In the absence of leukocytosis and lactic acid we will explore other potential etiologies, rule out DIC Hematologic workup --> normal smear, elevated LDH, elevated D-dimer, normal fibrinogen   Trend CBC  Hypokalemia Replace as needed Monitor BMP  Moderate rectal stool burden, suggesting mild fecal impaction. Early stercoral colitis is possible. - clinically significant imaging findings Chronic constipation   Medic in ED reported dark stool but no obvious melena, pt is on iron  supplements, hemoccult ordered prn.  Chronic constipation per husband Abx as above would cover colitis  Lactulose po if possible  Pt did have bowel movement yesterday, monitor I&O   Iron  deficiency anemia, unspecified H&H stable Continue to monitor   Essential hypertension, benign Holding antihypertensives in the setting of sepsis Hydralazine IV as needed   COPD (chronic obstructive pulmonary disease)  Not acutely exacerbated DuoNebs as needed  Left upper lobe lung nodule 7 x 9 mm  slight increased size since the 2023 exam.  Low-grade adenocarcinoma cannot be excluded.  Continued follow-up is recommended.  Elevated D-Dimer, Wells 0 Low suspicion for PE in absence of major risk factors, tachycardia, chest pain, hypoxia, resp distress Done for DIC w/u as above PE imaging --> neg    No concerns based on BMI: Body mass index is 22.93 kg/m.SABRA Significantly low or high BMI is associated with higher medical risk.  Underweight - under 18  overweight - 25 to 29 obese - 30 or more Class 1 obesity: BMI of 30.0 to 34 Class 2 obesity: BMI of 35.0 to 39 Class 3 obesity: BMI of 40.0 to 49 Super Morbid Obesity: BMI 50-59 Super-super Morbid  Obesity: BMI 60+ Healthy nutrition and physical activity advised as adjunct to other disease management and risk reduction treatments    DVT prophylaxis: hold Rx d/t low Plt  IV fluids: LR 100 cc/h  continuous IV fluids  Nutrition: pending swallow eval  Central lines / other devices: none  Code Status: FULL CODE ACP documentation reviewed: none on file in [facility]  TOC needs: TBD Medical barriers to dispo: w/u SIRS/sepsis Expected medical readiness for discharge pend clinical course, at least several days.

## 2023-12-04 NOTE — ED Notes (Signed)
 Fall risk bundle in place.

## 2023-12-04 NOTE — Procedures (Signed)
 Patient Name: Niasia Lanphear  MRN: [id number]  Epilepsy Attending: Arlin MALVA Krebs  Referring Physician/Provider: Cleatus Delayne GAILS, MD  Date: 12/04/2023 Duration: 28.24 mins  Patient history: 68 y.o. 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 EEG to evaluate for seizure  Level of alertness: Awake  AEDs during EEG study: LEV  Technical aspects: This EEG study was done with scalp electrodes positioned according to the 10-20 International system of electrode placement. Electrical activity was reviewed with band pass filter of 1-70Hz , sensitivity of 7 uV/mm, display speed of 71mm/sec with a 60Hz  notched filter applied as appropriate. EEG data were recorded continuously and digitally stored.  Video monitoring was available and reviewed as appropriate.  Description: The posterior dominant rhythm consists of 7 Hz activity of moderate voltage (25-35 uV) seen predominantly in posterior head regions, asymmetric ( left<right) and reactive to eye opening and eye closing. EEG showed continuous 3 to 6 Hz theta-delta slowing in left hemisphere as well as  5-7hz  theta-delta slowing in right hemisphere. Hyperventilation and photic stimulation were not performed.     ABNORMALITY - Continuous slow, generalized and lateralized left hemisphere  IMPRESSION: This study is [place] of cortical dysfunction arising from left hemisphere likely secondary to underlying structural abnormality, post-ictal state. Additionally there is generalized cerebral dysfunction ( encephalopathy). No seizures or epileptiform discharges were seen throughout the recording.  Lihanna Biever O Monay H[place]ton

## 2023-12-05 DIAGNOSIS — I6202 Nontraumatic subacute subdural hemorrhage: Secondary | ICD-10-CM

## 2023-12-05 DIAGNOSIS — F10939 Alcohol use, unspecified with withdrawal, unspecified: Secondary | ICD-10-CM

## 2023-12-05 DIAGNOSIS — R569 Unspecified convulsions: Secondary | ICD-10-CM

## 2023-12-05 DIAGNOSIS — A419 Sepsis, unspecified organism: Secondary | ICD-10-CM | POA: Diagnosis not present

## 2023-12-05 DIAGNOSIS — E512 Wernicke's encephalopathy: Secondary | ICD-10-CM

## 2023-12-05 DIAGNOSIS — I6203 Nontraumatic chronic subdural hemorrhage: Secondary | ICD-10-CM

## 2023-12-05 DIAGNOSIS — Z818 Family history of other mental and behavioral disorders: Secondary | ICD-10-CM

## 2023-12-05 LAB — COMPREHENSIVE METABOLIC PANEL WITH GFR
ALT: 25 U/L (ref 0–44)
AST: 65 U/L — ABNORMAL HIGH (ref 15–41)
Albumin: 3.3 g/dL — ABNORMAL LOW (ref 3.5–5.0)
Alkaline Phosphatase: 117 U/L (ref 38–126)
Anion gap: 12 (ref 5–15)
BUN: 19 mg/dL (ref 8–23)
CO2: 19 mmol/L — ABNORMAL LOW (ref 22–32)
Calcium: 9.6 mg/dL (ref 8.9–10.3)
Chloride: 107 mmol/L (ref 98–111)
Creatinine, Ser: 0.73 mg/dL (ref 0.44–1.00)
GFR, Estimated: >60 mL/min (ref >60)
Glucose, Bld: 91 mg/dL (ref 70–99)
Potassium: 2.9 mmol/L — ABNORMAL LOW (ref 3.5–5.1)
Sodium: 138 mmol/L (ref 135–145)
Total Bilirubin: 1.9 mg/dL — ABNORMAL HIGH (ref 0.0–1.2)
Total Protein: 6.4 g/dL — ABNORMAL LOW (ref 6.5–8.1)

## 2023-12-05 LAB — CBC
HCT: 30.2 % — ABNORMAL LOW (ref 36.0–46.0)
Hemoglobin: 10.2 g/dL — ABNORMAL LOW (ref 12.0–15.0)
MCH: 32.5 pg (ref 26.0–34.0)
MCHC: 33.8 g/dL (ref 30.0–36.0)
MCV: 96.2 fL (ref 80.0–100.0)
Platelets: 83 K/uL — ABNORMAL LOW (ref 150–400)
RBC: 3.14 MIL/uL — ABNORMAL LOW (ref 3.87–5.11)
RDW: 16 % — ABNORMAL HIGH (ref 11.5–15.5)
WBC: 7.8 K/uL (ref 4.0–10.5)
nRBC: 0 % (ref 0.0–0.2)

## 2023-12-05 LAB — AMMONIA: Ammonia: 56 umol/L — ABNORMAL HIGH (ref 9–35)

## 2023-12-05 LAB — MAGNESIUM: Magnesium: 1.8 mg/dL (ref 1.7–2.4)

## 2023-12-05 MED ORDER — ZONISAMIDE 100 MG PO CAPS
100.0000 mg | ORAL_CAPSULE | Freq: Two times a day (BID) | ORAL | Status: DC
  Filled 2023-12-05: qty 1

## 2023-12-05 MED ORDER — DEXTROSE IN LACTATED RINGERS 5 % IV SOLN: INTRAVENOUS | Status: AC

## 2023-12-05 MED ORDER — POTASSIUM CHLORIDE 10 MEQ/100ML IV SOLN
10.0000 meq | INTRAVENOUS | Status: AC
  Administered 2023-12-05 (×4): 10 meq via INTRAVENOUS
  Filled 2023-12-05 (×4): qty 100

## 2023-12-05 NOTE — Plan of Care (Signed)

## 2023-12-05 NOTE — Progress Notes (Addendum)
 PROGRESS NOTE    Andrea Bell   FMW:[id number] DOB: [birth year]  DOA: 12/03/2023 Date of Service: 12/05/23 which is hospital day 2  PCP: Lora Odor, [facility] course / significant events:   HPI:  Andrea Bell is a 68 y.o. 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 History from husband is that following the seizure she has been confused, weak, shaking and mumbling which is far from her baseline   10/14: to ED. Fever 102.7, HR 108, lumbar puncture performed, CT head non-acute, started on sepsis fluids and broad spectrum abx, admitted for SIRS/Sepsis FUO. CT abd/pelv (+)hepatic steatosis/cirrhosis and ammonia slightly up, large stool burden likely impacted, procal up,  10/15: still npo pending swallow eval, if able to take lactulose will administer this otherwise will need enema / disimpaction. EEG pending. Remains encephalopathic but responsive, follows commands. Medic in ED reported dark stool but no obvious melena, pt is on iron  supplements, hemoccult ordered prn. Spoke w/ husband, she is a heavy drinker about 2 times per week, unsure last drink, question EtOH withdrawal to explain symptoms including seizure and fever, on CIWA. 10/16: ammonia slight increase. MRI brain no mass effect or new bleed, question old hemosiderin staining on L frontal prior subarachnoid hemorrhage may be seizure focus. SLP eval pending. Neuro pending. Plt improving. Pt alert and a bit more talkative than yesterday but still confused      Consultants:  none  Procedures/Surgeries: Lumbar puncture in ED      ASSESSMENT & PLAN:   Sepsis, unknown source Question SIRS without infection   Fever unknown origin EtOH use question withdrawal   Sepsis criteria include fever,  tachycardia albeit with normal WBC and lactic acid Spoke w/ husband, she is a heavy drinker about 2 times per week, unsure last drink, question EtOH withdrawal to explain symptoms including seizure and fever, on CIWA. Neg Flu/COVID/RSV. UA and CXR, head CT all unrevealing Procal elevated --> supports abx, repeat tomorrow for trend  Underwent LP in the ED, CSF studies ---> appearing sterile so far Continue broad-spectrum antibiotics to treat sepsis of unknown source, consider narrow/dc if procal trend down  Neurology consult - appreciate recs re: seizure or other neuro component to explain symptoms, given no obvious infectious source and no meaningful improvement    Acute metabolic encephalopathy EtOH use question withdrawal   Suspect secondary to sepsis in the setting of seizure a couple days prior-doubt prolonged postictal. Question EtOH component. Question component of hepatic encephalopathy. Question component of vascular dementia acute worsening based on CT head images w/ ...mild cerebral atrophy with widening of the extra-axial spaces and ventricular dilatation. There are areas of decreased attenuation within the white matter tracts of the supratentorial brain, consistent with microvascular disease changes n.p.o. pending SLP consult  Neurologic checks  CIWA fall and aspiration precautions Neurology consult - appreciate recs re: seizure or other neuro component to explain symptoms, given no obvious infectious source and no meaningful improvement    Thrombocytopenia Platelets 86,000 10/14 down from baseline of 138,000 --> 64000 10/15 --> 84000  Likely secondary to severe sepsis complicated by cirrhosis,  In the absence of leukocytosis and lactic acid we will explore other potential etiologies, rule out DIC Hematologic workup --> normal smear, elevated LDH, elevated D-dimer, normal fibrinogen   Trend CBC  Elevated D-Dimer, Malvina  0 Low suspicion for PE in absence of major risk factors,  tachycardia, chest pain, hypoxia, resp distress Done for DIC w/u as above Will defer imaging for now   Moderate rectal stool burden, suggesting mild fecal impaction. Early stercoral colitis is possible. - clinically significant imaging findings Chronic constipation   Medic in ED reported dark stool but no obvious melena, pt is on iron  supplements, hemoccult ordered prn.  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93 kg/m.SABRA Significantly low or high BMI is associated with higher medical risk.  Underweight - under 18  overweight - 25 to 29 obese - 30 or more Class 1 obesity: BMI of 30.0 to 34 Class 2 obesity: BMI of 35.0 to 39 Class 3 obesity: BMI of 40.0 to 49 Super Morbid Obesity: BMI 50-59 Super-super Morbid Obesity: BMI 60+ Healthy nutrition and physical activity advised as adjunct to other disease management and risk reduction treatments    DVT prophylaxis: hold Rx d/t low Plt  IV fluids: LR 100 cc/h continuous IV fluids  Nutrition: pending swallow eval  Central lines / other devices:  none  Code Status: FULL CODE ACP documentation reviewed: none on file in [facility]  TOC needs: TBD Medical barriers to dispo: encephalopathic, needing abx. Expected medical readiness for discharge pend clinical course, at least several days.              Subjective / Brief ROS:  Patient reports no complaints - she awakens to voice and answers questions but is difficult to understand. Same as yesterday. She is stating she wants something to drink ut voices no other needs or complaints. I ask about pain, other symptoms and she answers yes to almost everything even irrelevant questions like is your lft big tow hurting, is your nose runny, etc. She follows commands see physical exam.    Family Communication: spoke w/ husband yesterday and pn phone this evening all questions answered     Objective Findings:  Vitals:   12/04/23 1915 12/05/23 0011 12/05/23 0410 12/05/23 0759  BP: (!) 119/49 127/66 127/60 114/60  Pulse: 90 98 100 89  Resp: 16 20 16 16   Temp: 99 F (37.2 C) 99.5 F (37.5 C) 99.5 F (37.5 C) 99.1 F (37.3 C)  TempSrc: Oral Oral Oral   SpO2: 97% 97% 98% 97%  Weight:      Height:        Intake/Output Summary (Last 24 hours) at 12/05/2023 1227 Last data filed at 12/05/2023 0654 Gross per 24 hour  Intake 218.43 ml  Output --  Net 218.43 ml   Filed Weights   12/03/23 1432  Weight: 66.4 kg    Examination:  Physical Exam Constitutional:      General: She is not in acute distress.    Appearance: She is ill-appearing.  Cardiovascular:     Rate and Rhythm: Normal rate and regular rhythm.  Pulmonary:     Effort: Pulmonary effort is normal.     Breath sounds: Normal breath sounds.  Abdominal:     General: Bowel sounds are normal. There is distension.     Palpations: Abdomen is soft.     Tenderness: There is no guarding or rebound.  Musculoskeletal:     Right lower leg: No edema.     Left lower leg: No edema.  Skin:    General: Skin is warm and  dry.  Neurological:     Mental Status: She is alert. She is disoriented.  Psychiatric:        Behavior: Behavior normal.          Scheduled Medications:   lactulose  20 g Oral Daily   levETIRAcetam   1,500 mg Intravenous Q12H   thiamine  (VITAMIN B1) injection  100 mg Intravenous Q24H    Continuous Infusions:  cefTRIAXone (ROCEPHIN)  IV 2 g (12/04/23 1837)   dextrose  5% lactated ringers  75 mL/hr at 12/05/23 0745   potassium chloride  10 mEq (12/05/23 1145)    PRN Medications:  acetaminophen  **OR** acetaminophen , albuterol , LORazepam , LORazepam  **OR** LORazepam , ondansetron  **OR** ondansetron  (ZOFRAN ) IV  Antimicrobials from admission:  Anti-infectives (From admission, onward)    Start     Dose/Rate Route Frequency Ordered Stop   12/05/23 0800  vancomycin (VANCOREADY) IVPB 1500 mg/300 mL  Status:  Discontinued        1,500 mg 150 mL/hr over 120 Minutes Intravenous Every 24 hours 12/04/23 1015 12/04/23 1017   12/04/23 1800  cefTRIAXone (ROCEPHIN) 2 g in sodium chloride  0.9 % 100 mL IVPB        2 g 200 mL/hr over 30 Minutes Intravenous Daily-1800 12/04/23 1018     12/04/23 0800  vancomycin (VANCOREADY) IVPB 750 mg/150 mL  Status:  Discontinued        750 mg 150 mL/hr over 60 Minutes Intravenous Every 12 hours 12/03/23 2339 12/04/23 1015   12/04/23 0600  metroNIDAZOLE (FLAGYL) IVPB 500 mg  Status:  Discontinued        500 mg 100 mL/hr over 60 Minutes Intravenous Every 12 hours 12/03/23 2155 12/04/23 1017   12/04/23 0000  ceFEPIme (MAXIPIME) 2 g in sodium chloride  0.9 % 100 mL IVPB  Status:  Discontinued        2 g 200 mL/hr over 30 Minutes Intravenous Every 8 hours 12/03/23 2226 12/04/23 1017   12/03/23 2230  vancomycin (VANCOREADY) IVPB 500 mg/100 mL        500 mg 100 mL/hr over 60 Minutes Intravenous  Once 12/03/23 2224 12/04/23 0348   12/03/23 2200  vancomycin (VANCOCIN) IVPB 1000 mg/200 mL premix  Status:  Discontinued        1,000 mg 200 mL/hr over 60 Minutes  Intravenous  Once 12/03/23 2155 12/03/23 2224   12/03/23 1530  ceFEPIme (MAXIPIME) 2 g in sodium chloride  0.9 % 100 mL IVPB        2 g 200 mL/hr over 30 Minutes Intravenous  Once 12/03/23 1525 12/03/23 1636   12/03/23 1530  metroNIDAZOLE (FLAGYL) IVPB 500 mg        500 mg 100 mL/hr over 60 Minutes Intravenous  Once 12/03/23 1525 12/03/23 1822   12/03/23 1530  vancomycin (VANCOCIN) IVPB 1000 mg/200 mL premix  1,000 mg 200 mL/hr over 60 Minutes Intravenous  Once 12/03/23 1525 12/03/23 2001           Data Reviewed:  I have personally reviewed the following...  CBC: Recent Labs  Lab 12/03/23 1439 12/03/23 2009 12/04/23 0520 12/05/23 0439  WBC 7.1 7.1 6.5 7.8  NEUTROABS 5.9 5.4  --   --   HGB 11.0* 10.3* 10.4* 10.2*  HCT 34.1* 31.5* 32.1* 30.2*  MCV 96.9 96.9 97.0 96.2  PLT 86* 72* 64* 83*   Basic Metabolic Panel: Recent Labs  Lab 12/03/23 1439 12/04/23 0555 12/04/23 1633 12/05/23 0439  NA 136 137  --  138  K 3.4* 2.9*  --  2.9*  CL 99 104  --  107  CO2 22 22  --  19*  GLUCOSE 137* 105*  --  91  BUN 23 21  --  19  CREATININE 0.74 0.61  --  0.73  CALCIUM  10.3 9.6  --  9.6  MG  --   --  1.2* 1.8   GFR: Estimated Creatinine Clearance: 66.4 mL/min (by C-G formula based on SCr of 0.73 mg/dL). Liver Function Tests: Recent Labs  Lab 12/03/23 1439 12/04/23 0555 12/05/23 0439  AST 101* 107* 65*  ALT 25 29 25   ALKPHOS 155* 113 117  BILITOT 2.7* 2.1* 1.9*  PROT 8.3* 6.7 6.4*  ALBUMIN 4.5 3.2* 3.3*   No results for input(s): LIPASE, AMYLASE in the last 168 hours. Recent Labs  Lab 12/03/23 2125 12/04/23 1633 12/05/23 0439  AMMONIA 46* 47* 56*   Coagulation Profile: Recent Labs  Lab 12/03/23 1439 12/04/23 0520  INR 1.3* 1.5*   Cardiac Enzymes: No results for input(s): CKTOTAL, CKMB, CKMBINDEX, TROPONINI in the last 168 hours. BNP (last 3 results) No results for input(s): PROBNP in the last 8760 hours. HbA1C: No results for  input(s): HGBA1C in the last 72 hours. CBG: No results for input(s): GLUCAP in the last 168 hours. Lipid Profile: No results for input(s): CHOL, HDL, LDLCALC, TRIG, CHOLHDL, LDLDIRECT in the last 72 hours. Thyroid  Function Tests: No results for input(s): TSH, T4TOTAL, FREET4, T3FREE, THYROIDAB in the last 72 hours. Anemia Panel: No results for input(s): VITAMINB12, FOLATE, FERRITIN, TIBC, IRON , RETICCTPCT in the last 72 hours. Most Recent Urinalysis On File:     Component Value Date/Time   COLORURINE YELLOW (A) 12/03/2023 1436   APPEARANCEUR CLEAR (A) 12/03/2023 1436   APPEARANCEUR Clear 02/05/2013 1159   LABSPEC 1.016 12/03/2023 1436   LABSPEC 1.024 02/05/2013 1159   PHURINE 8.0 12/03/2023 1436   GLUCOSEU NEGATIVE 12/03/2023 1436   GLUCOSEU Negative 02/05/2013 1159   HGBUR SMALL (A) 12/03/2023 1436   BILIRUBINUR NEGATIVE 12/03/2023 1436   BILIRUBINUR Negative 02/05/2013 1159   KETONESUR 20 (A) 12/03/2023 1436   PROTEINUR 30 (A) 12/03/2023 1436   NITRITE NEGATIVE 12/03/2023 1436   LEUKOCYTESUR NEGATIVE 12/03/2023 1436   LEUKOCYTESUR Negative 02/05/2013 1159   Sepsis Labs: @LABRCNTIP (procalcitonin:4,lacticidven:4) Microbiology: Recent Results (from the past 240 hours)  Blood culture (routine x 2)     Status: None (Preliminary result)   Collection Time: 12/03/23  2:39 PM   Specimen: BLOOD  Result Value Ref Range Status   Specimen Description BLOOD RIGHT ANTECUBITAL  Final   Special Requests   Final    BOTTLES DRAWN AEROBIC AND ANAEROBIC Blood Culture results may not be optimal due to an inadequate volume of blood received in culture bottles   Culture   Final    NO GROWTH 2 DAYS  Performed at [facility], [address] [place], [place] [address]    Report Status PENDING  Incomplete  Resp panel by RT-PCR (RSV, Flu A&B, Covid) Anterior Nasal Swab     Status: None   Collection Time: 12/03/23  2:39 PM   Specimen: Anterior Nasal  Swab  Result Value Ref Range Status   SARS Coronavirus 2 by RT PCR NEGATIVE NEGATIVE Final    Comment: (NOTE) SARS-CoV-2 target nucleic acids are NOT DETECTED.  The SARS-CoV-2 RNA is generally detectable in upper respiratory specimens during the acute phase of infection. The lowest concentration of SARS-CoV-2 viral copies this assay can detect is 138 copies/mL. A negative result does not preclude SARS-Cov-2 infection and should not be used as the sole basis for treatment or other patient management decisions. A negative result may occur with  improper specimen collection/handling, submission of specimen other than nasopharyngeal swab, presence of viral mutation(s) within the areas targeted by this assay, and inadequate number of viral copies(<138 copies/mL). A negative result must be combined with clinical observations, patient history, and epidemiological information. The expected result is Negative.  Fact Sheet for Patients:  BloggerCourse.com  Fact Sheet for Healthcare Providers:  SeriousBroker.it  This test is no t yet approved or cleared by the [place]  FDA and  has been authorized for detection and/or diagnosis of SARS-CoV-2 by FDA under an Emergency Use Authorization (EUA). This EUA will remain  in effect (meaning this test can be used) for the duration of the COVID-19 declaration under Section 564(b)(1) of the Act, 21 U.S.C.section 360bbb-3(b)(1), unless the authorization is terminated  or revoked sooner.       Influenza A by PCR NEGATIVE NEGATIVE Final   Influenza B by PCR NEGATIVE NEGATIVE Final    Comment: (NOTE) The Xpert Xpress SARS-CoV-2/FLU/RSV plus assay is intended as an aid in the diagnosis of influenza from Nasopharyngeal swab specimens and should not be used as a sole basis for treatment. Nasal washings and aspirates are unacceptable for Xpert Xpress SARS-CoV-2/FLU/RSV testing.  Fact Sheet for  Patients: BloggerCourse.com  Fact Sheet for Healthcare Providers: SeriousBroker.it  This test is not yet approved or cleared by the [place]  FDA and has been authorized for detection and/or diagnosis of SARS-CoV-2 by FDA under an Emergency Use Authorization (EUA). This EUA will remain in effect (meaning this test can be used) for the duration of the COVID-19 declaration under Section 564(b)(1) of the Act, 21 U.S.C. section 360bbb-3(b)(1), unless the authorization is terminated or revoked.     Resp Syncytial Virus by PCR NEGATIVE NEGATIVE Final    Comment: (NOTE) Fact Sheet for Patients: BloggerCourse.com  Fact Sheet for Healthcare Providers: SeriousBroker.it  This test is not yet approved or cleared by the [place]  FDA and has been authorized for detection and/or diagnosis of SARS-CoV-2 by FDA under an Emergency Use Authorization (EUA). This EUA will remain in effect (meaning this test can be used) for the duration of the COVID-19 declaration under Section 564(b)(1) of the Act, 21 U.S.C. section 360bbb-3(b)(1), unless the authorization is terminated or revoked.  Performed at [facility], [address] [place], [place] [address]   Blood culture (routine x 2)     Status: None (Preliminary result)   Collection Time: 12/03/23  3:05 PM   Specimen: BLOOD  Result Value Ref Range Status   Specimen Description BLOOD BLOOD RIGHT HAND  Final   Special Requests   Final    BOTTLES DRAWN AEROBIC AND ANAEROBIC Blood Culture results  may not be optimal due to an inadequate volume of blood received in culture bottles   Culture   Final    NO GROWTH 2 DAYS Performed at [facility], [address] [place], [place] [address]    Report Status PENDING  Incomplete  CSF culture w Gram Stain     Status: None (Preliminary result)   Collection Time: 12/03/23  6:56 PM    Specimen: CSF; Cerebrospinal Fluid  Result Value Ref Range Status   Specimen Description   Final    CSF Performed at [facility], [address] [place], [place] [address]    Special Requests   Final    NONE Performed at [facility], [address] [place], [place] [address]    Gram Stain   Final    NO ORGANISMS SEEN WBC SEEN RED BLOOD CELLS PRESENT GRAM STAIN REVIEWED-AGREE WITH RESULT [id number] AT 0029, ADC    Culture   Final    NO GROWTH 2 DAYS Performed at [facility], [address] [place], [place] [address]    Report Status PENDING  Incomplete  Respiratory (~20 pathogens) panel by PCR     Status: None   Collection Time: 12/04/23 12:18 AM   Specimen: Nasopharyngeal Swab; Respiratory  Result Value Ref Range Status   Adenovirus NOT DETECTED NOT DETECTED Final   Coronavirus 229E NOT DETECTED NOT DETECTED Final    Comment: (NOTE) The Coronavirus on the Respiratory Panel, DOES NOT test for the novel  Coronavirus (2019 nCoV)    Coronavirus HKU1 NOT DETECTED NOT DETECTED Final   Coronavirus NL63 NOT DETECTED NOT DETECTED Final   Coronavirus OC43 NOT DETECTED NOT DETECTED Final   Metapneumovirus NOT DETECTED NOT DETECTED Final   Rhinovirus / Enterovirus NOT DETECTED NOT DETECTED Final   Influenza A NOT DETECTED NOT DETECTED Final   Influenza B NOT DETECTED NOT DETECTED Final   Parainfluenza Virus 1 NOT DETECTED NOT DETECTED Final   Parainfluenza Virus 2 NOT DETECTED NOT DETECTED Final   Parainfluenza Virus 3 NOT DETECTED NOT DETECTED Final   Parainfluenza Virus 4 NOT DETECTED NOT DETECTED Final   Respiratory Syncytial Virus NOT DETECTED NOT DETECTED Final   Bordetella pertussis NOT DETECTED NOT DETECTED Final   Bordetella Parapertussis NOT DETECTED NOT DETECTED Final   Chlamydophila pneumoniae NOT DETECTED NOT DETECTED Final   Mycoplasma pneumoniae NOT DETECTED NOT DETECTED Final    Comment: Performed at [facility], [address] [place], [place] [address]      Radiology Studies last 3 days: MR BRAIN W WO CONTRAST Result Date: 12/05/2023 EXAM: MRI BRAIN WITH AND WITHOUT CONTRAST 12/04/2023 11:53:14 PM TECHNIQUE: Multiplanar multisequence MRI of the head/brain was performed with and without the administration of 6 mL gadobutrol (GADAVIST) 1 MMOL/ML injection. COMPARISON: Comparison with prior head CT from 12/03/2023 as well as previous CT from 05/05/2023. CLINICAL HISTORY: Seizure, abnormal EEG with concern for localized etiology. Best images obtainable due to patient motion, unable to follow coaching on motion. 6 mL gadavist. 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 EEG to evaluate for seizure. FINDINGS: BRAIN AND VENTRICLES: Cerebral volume within normal limits. Patchy T2/FLAIR hypertensity involving the periventricular and deep white matter of both cerebral hemispheres, consistent with chronic cerebral ischemic disease, mild for age. Superimposed remote lacunar infarct present at the posterior left frontal  corona radiata. Symmetric increased T1 signal intensity noted about the bilateral basal ganglia, nonspecific but most commonly related to intrinsic liver disease. No evidence for acute or subacute infarct. No signal changes of acute seizure. No areas of chronic cortical infarction. Subdural collection overlying the left cerebral convexity is seen, measuring up to 4 mm in maximal thickness. Additional trace right sided subdural collection measuring 1 to 2 mm in thickness. Findings most likely reflect small residual subacute to chronic subdural hematomas. This was acute in nature on prior CT from 05/05/2023. Additionally, there is chronic hemosiderin staining involving the left frontal lobe, consistent with prior subarachnoid hemorrhage at this  location (series 12, image 42). No acute blood product seen at this location on prior head CT. Finding could serve as a seizure focus. No mass lesion or midline shift. No hydrocephalus. Pituitary gland within normal limits. No intrinsic temporal lobe abnormality. Smooth pachymeningeal thickening seen about both cerebral hemispheres, likely reactive. No other abnormal enhancement. Normal flow voids. ORBITS: No acute abnormality. SINUSES: Mild scattered mucosal thickening present about the sphenoethmoidal sinuses. BONES AND SOFT TISSUES: Decreased T1 signal intensity noted within the visualized bone marrow, nonspecific, but most commonly related to anemia, smoking, or BCD. No acute soft tissue abnormality. IMPRESSION: 1. Small residual subacute to chronic subdural hematomas overlying both cerebral convexities, left larger than right. No significant mass effect or midline shift. 2. Chronic hemosiderin staining in the left frontal lobe, consistent with prior subarachnoid hemorrhage. Finding could potentially serve as a seizure focus. 3. No other acute intracranial abnormality. 4. Underlying mild chronic microvascular ischemic disease. Electronically signed by: Morene Hoard MD 12/05/2023 12:27 AM EDT RP Workstation: [id number]   EEG adult Result Date: 12/04/2023 Shelton Arlin KIDD, MD     12/04/2023  4:44 PM Patient Name: Andrea Bell MRN: [id number] Epilepsy Attending: Arlin KIDD Shelton Referring Physician/Provider: Cleatus Delayne GAILS, MD Date: 12/04/2023 Duration: 28.24 mins Patient history: 68 y.o. 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 EEG to evaluate for seizure Level of alertness: Awake AEDs during EEG study: LEV Technical aspects: This EEG study was done with scalp electrodes positioned according to the 10-20 International system of  electrode placement. Electrical activity was reviewed with band pass filter of 1-70Hz , sensitivity of 7 uV/mm, display speed of 83mm/sec with a 60Hz  notched filter applied as appropriate. EEG data were recorded continuously and digitally stored.  Video monitoring was available and reviewed as appropriate. Description: The posterior dominant rhythm consists of 7 Hz activity of moderate voltage (25-35 uV) seen predominantly in posterior head regions, asymmetric ( left<right) and reactive to eye opening and eye closing. EEG showed continuous 3 to 6 Hz theta-delta slowing in left hemisphere as well as  5-7hz  theta-delta slowing in right hemisphere. Hyperventilation and photic stimulation were not performed.   ABNORMALITY - Continuous slow, generalized and lateralized left hemisphere IMPRESSION: This study is [place] of cortical dysfunction arising from left hemisphere likely secondary to underlying structural abnormality, post-ictal state. Additionally there is generalized cerebral dysfunction ( encephalopathy). No seizures or epileptiform discharges were seen throughout the recording. Priyanka KIDD Shelton   CT ABDOMEN PELVIS WO CONTRAST Result Date: 12/03/2023 EXAM: CT ABDOMEN AND PELVIS WITHOUT CONTRAST 12/03/2023 09:48:47 PM TECHNIQUE: CT of the abdomen and pelvis was performed without the administration of intravenous contrast. Multiplanar reformatted images are provided for review. Automated exposure control, iterative reconstruction, and/or weight-based adjustment  of the mA/kV was utilized to reduce the radiation dose to as low as reasonably achievable. COMPARISON: PET CT dated 06/24/2019. CLINICAL HISTORY: Sepsis. FINDINGS: LOWER CHEST: No acute abnormality. LIVER: Cirrhosis. Hepatic steatosis. GALLBLADDER AND BILE DUCTS: Gallbladder is unremarkable. No biliary ductal dilatation. SPLEEN: No acute abnormality. PANCREAS: No acute abnormality. ADRENAL GLANDS: No acute abnormality. KIDNEYS, URETERS AND BLADDER: No  stones in the kidneys or ureters. No hydronephrosis. No perinephric or periureteral stranding. Urinary bladder is unremarkable. GI AND BOWEL: Stomach demonstrates no acute abnormality. Moderate rectal stool burden, suggesting mild fecal impaction. Early stercoral colitis is possible. The appendix is not discretely visualized. There is no bowel obstruction. PERITONEUM AND RETROPERITONEUM: No ascites. No free air. VASCULATURE: Aorta is normal in caliber. Atherosclerotic calcifications of the abdominal aorta and branch vessels. LYMPH NODES: No lymphadenopathy. REPRODUCTIVE ORGANS: Status post hysterectomy. BONES AND SOFT TISSUES: Mild degenerative changes of the lumbar spine. No acute osseous abnormality. No focal soft tissue abnormality. IMPRESSION: 1. Moderate rectal stool burden, suggesting mild fecal impaction. Early stercoral colitis is possible. 2. Cirrhosis and hepatic steatosis. Electronically signed by: Pinkie Pebbles MD 12/03/2023 09:55 PM EDT RP Workstation: [id number]   CT Head Wo Contrast Result Date: 12/03/2023 CLINICAL DATA:  Altered mental status. EXAM: CT HEAD WITHOUT CONTRAST TECHNIQUE: Contiguous axial images were obtained from the base of the skull through the vertex without intravenous contrast. RADIATION DOSE REDUCTION: This exam was performed according to the departmental dose-optimization program which includes automated exposure control, adjustment of the mA and/or kV according to patient size and/or use of iterative reconstruction technique. COMPARISON:  May 06, 2023 FINDINGS: Brain: There is mild cerebral atrophy with widening of the extra-axial spaces and ventricular dilatation. There are areas of decreased attenuation within the white matter tracts of the supratentorial brain, consistent with microvascular disease changes. Vascular: No hyperdense vessel or unexpected calcification. Skull: Normal. Negative for fracture or focal lesion. Sinuses/Orbits: No acute finding. Other: None.  IMPRESSION: No acute intracranial abnormality. Electronically Signed   By: Suzen Dials M.D.   On: 12/03/2023 15:52   DG Chest Portable 1 View Result Date: 12/03/2023 CLINICAL DATA:  fever EXAM: DG CHEST 1V PORT COMPARISON:  05/05/2023 FINDINGS: No focal airspace consolidation, pleural effusion, or pneumothorax. No cardiomegaly. Tortuous aorta with aortic atherosclerosis. No acute fracture or destructive lesions. Multilevel thoracic osteophytosis. Age-indeterminate fracture of the distal right clavicle. IMPRESSION: 1. No acute cardiopulmonary abnormality. 2. Age-indeterminate fracture of the distal right clavicle, likely chronic. Correlation with point tenderness recommended to assess for acuity. Electronically Signed   By: Rogelia Myers M.D.   On: 12/03/2023 15:24       Time spent: 50 min     Laneta Blunt, DO [facility] 12/05/2023, 12:27 PM    Dictation software may have been used to generate the above note. Typos may occur and escape review in typed/dictated notes. Please contact Dr Blunt directly for clarity if needed.  Staff may message me via secure chat in Epic  but this may not receive an immediate response,  please page me for urgent matters!  If 7PM-7AM, please contact night coverage [email or web address]

## 2023-12-05 NOTE — Consult Note (Addendum)
 NEUROLOGY CONSULT NOTE   Date of service: December 05, 2023 Patient Name: Andrea Bell MRN:  [id number] DOB:  Apr 20, 1955 Chief Complaint: AMS Requesting Provider: Marsa Edelman, DO  History of Present Illness  Kenyah Luba is a 68 y.o. female with a PMHx of seizures, anemia, chronic back pain, COPD, GERD, dyspnea, headache, HTN, HLD, insomnia, osteoarthritis, osteoporosis, tachycardia and vertigo who presented to the hospital on 10/14 with AMS in the context of SIRS/Sepsis/FUO. Hospitalist also suspects possible EtOH withdrawal. She had an LP in the ED with 0 WBC in tube 1, 7 WBC in tube 4, clear and colorless with normal total protein and slightly elevated glucose of 78 (serum glucose 4 hours prior to LP was elevated at 137).   Regarding the possibility of subclinical seizures, she is prescribed Keppra  1500 mg BID and Zonegran 100 mg BID, but she has uncertain medication adherence. Husband stated that she drinks heavily a couple of times per week. EEG showed generalized slowing as well as lateralized slowing in the left hemisphere. 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    ROS  Unable to obtain due to abulia.   Past History   Past Medical History:  Diagnosis Date   Allergy    Anemia    Chronic back pain    Complication of anesthesia    slow to wake   COPD (chronic obstructive pulmonary disease) (HCC)    2l home oxygen    Dyspnea    GERD (gastroesophageal reflux disease)    Headache    HTN (hypertension)    Hyperlipidemia    Insomnia    Insomnia    Osteoarthritis    Osteoporosis    Oxygen  deficiency    Tachycardia    Vertigo    none recently   Wears dentures    partial lower    Past Surgical History:  Procedure Laterality Date   ABDOMINAL HYSTERECTOMY     BIOPSY  12/31/2022   Procedure: BIOPSY;  Surgeon:  Unk Corinn Skiff, MD;  Location: [facility];  Service: Gastroenterology;;   COLONOSCOPY WITH PROPOFOL  N/A 12/31/2022   Procedure: COLONOSCOPY WITH PROPOFOL ;  Surgeon: Unk Corinn Skiff, MD;  Location: [facility];  Service: Gastroenterology;  Laterality: N/A;   CYST REMOVAL TRUNK     CYSTECTOMY     back and side of head   CYSTOSCOPY N/A 12/13/2015   Procedure: CYSTOSCOPY;  Surgeon: Glory High, MD;  Location: [facility];  Service: Gynecology;  Laterality: N/A;   ESOPHAGOGASTRODUODENOSCOPY (EGD) WITH PROPOFOL  N/A 12/31/2022   Procedure: ESOPHAGOGASTRODUODENOSCOPY (EGD) WITH PROPOFOL ;  Surgeon: Unk Corinn Skiff, MD;  Location: [facility];  Service: Gastroenterology;  Laterality: N/A;   HOT HEMOSTASIS  12/31/2022   Procedure: HOT HEMOSTASIS (ARGON PLASMA COAGULATION/BICAP);  Surgeon: Unk Corinn Skiff, MD;  Location: [facility];  Service: Gastroenterology;;   LAPAROSCOPIC BILATERAL SALPINGO OOPHERECTOMY Bilateral 12/13/2015   Procedure: LAPAROSCOPIC BILATERAL SALPINGO OOPHORECTOMY;  Surgeon: Glory High, MD;  Location: [facility];  Service: Gynecology;  Laterality: Bilateral;   LAPAROSCOPIC HYSTERECTOMY N/A 12/13/2015   Procedure: HYSTERECTOMY TOTAL LAPAROSCOPIC;  Surgeon: Glory High, MD;  Location: [facility];  Service: Gynecology;  Laterality: N/A;   tear duct surgery     bilateral   TUBAL LIGATION      Family History: Family History  Problem Relation Age of Onset   Hypertension Mother    Diabetes Mellitus  II Mother    Dementia Mother    Diabetes Mother    Cancer - Other Father        brain tumor   Cancer Father        brain tumor   Breast cancer Maternal Aunt 41   Cancer Maternal Aunt        ovarian   Breast cancer Maternal Grandmother 41   Cancer Maternal Grandmother        breast   Diabetes Brother    Hypertension Brother    COPD Brother    Cancer Maternal Grandfather        stomach   Heart disease Paternal Grandmother    Stroke  Paternal Grandfather     Social History  reports that she quit smoking about 12 years ago. Her smoking use included cigarettes. She started smoking about 27 years ago. She has a 7.5 pack-year smoking history. She has never used smokeless tobacco. She reports current alcohol use of about 10.0 standard drinks of alcohol per week. She reports that she does not use drugs.  Allergies  Allergen Reactions   Codeine  Nausea And Vomiting    Was used while in hospital 02/24/14 and no reaction noted--01/06/15 BC  Other Reaction(s): itching and feels faint    Medications   Current Facility-Administered Medications:    acetaminophen  (TYLENOL ) tablet 650 mg, 650 mg, Oral, Q6H PRN **OR** acetaminophen  (TYLENOL ) suppository 650 mg, 650 mg, Rectal, Q6H PRN, Cleatus Delayne GAILS, MD   albuterol  (PROVENTIL ) (2.5 MG/3ML) 0.083% nebulizer solution 2.5 mg, 2.5 mg, Nebulization, Q4H PRN, Cleatus Delayne GAILS, MD   cefTRIAXone (ROCEPHIN) 2 g in sodium chloride  0.9 % 100 mL IVPB, 2 g, Intravenous, q1800, Marsa Edelman, DO, Last Rate: 200 mL/hr at 12/04/23 1837, 2 g at 12/04/23 1837   dextrose  5 % in lactated ringers  infusion, , Intravenous, Continuous, Marsa Edelman, DO, Last Rate: 75 mL/hr at 12/05/23 0745, New Bag at 12/05/23 0745   lactulose (CHRONULAC) 10 GM/15ML solution 20 g, 20 g, Oral, Daily, Alexander, Natalie, DO   levETIRAcetam  (KEPPRA ) undiluted injection 1,500 mg, 1,500 mg, Intravenous, Q12H, Alexander, Natalie, DO, 1,500 mg at 12/05/23 9042   LORazepam  (ATIVAN ) injection 1 mg, 1 mg, Intravenous, Q5 min PRN, Cleatus Delayne GAILS, MD   LORazepam  (ATIVAN ) tablet 1-4 mg, 1-4 mg, Oral, Q1H PRN **OR** LORazepam  (ATIVAN ) injection 1-4 mg, 1-4 mg, Intravenous, Q1H PRN, Alexander, Natalie, DO   ondansetron  (ZOFRAN ) tablet 4 mg, 4 mg, Oral, Q6H PRN **OR** ondansetron  (ZOFRAN ) injection 4 mg, 4 mg, Intravenous, Q6H PRN, Cleatus Delayne V, MD, 4 mg at 12/04/23 1859   thiamine  (VITAMIN B1) injection 100 mg, 100 mg,  Intravenous, Q24H, Marsa Edelman, DO, 100 mg at 12/04/23 2216  No current facility-administered medications on file prior to encounter.   Current Outpatient Medications on File Prior to Encounter  Medication Sig Dispense Refill   albuterol  (PROVENTIL ) (2.5 MG/3ML) 0.083% nebulizer solution Take 3 mLs (2.5 mg total) by nebulization every 4 (four) hours as needed for wheezing or shortness of breath. 150 mL 3   albuterol  (VENTOLIN  HFA) 108 (90 Base) MCG/ACT inhaler Inhale 2 puffs into the lungs every 6 (six) hours as needed for wheezing or shortness of breath. 1 each 3   amLODipine  (NORVASC ) 10 MG tablet Take 1 tablet by mouth daily.     aspirin  EC 81 MG tablet Take 81 mg by mouth daily.     atorvastatin  (LIPITOR) 40 MG tablet Take 1 tablet by mouth daily.  bisacodyl  (DULCOLAX) 5 MG EC tablet Take 5 mg by mouth daily as needed for moderate constipation.     diazepam (VALIUM) 5 MG tablet Take 5 mg by mouth as directed.     Fluticasone-Umeclidin-Vilant (TRELEGY ELLIPTA ) 100-62.5-25 MCG/INH AEPB Inhale 1 puff into the lungs daily. 1 each 11   levETIRAcetam  (KEPPRA ) 750 MG tablet Take 1,500 mg by mouth 2 (two) times daily.     traZODone  (DESYREL ) 150 MG tablet Take 150 mg by mouth at bedtime.     Vitamin D , Ergocalciferol , (DRISDOL ) 1.25 MG (50000 UNIT) CAPS capsule Take 50,000 Units by mouth once a week.     zonisamide (ZONEGRAN) 100 MG capsule Take 100 mg by mouth 2 (two) times daily.     Blood Pressure Monitoring (BLOOD PRESSURE CUFF) MISC 1 each by Does not apply route daily. 1 each 0   OXYGEN  Inhale 2 L into the lungs as needed.        Vitals   Vitals:   12/05/23 0011 12/05/23 0410 12/05/23 0759 12/05/23 1230  BP: 127/66 127/60 114/60 120/63  Pulse: 98 100 89 92  Resp: 20 16 16 18   Temp: 99.5 F (37.5 C) 99.5 F (37.5 C) 99.1 F (37.3 C) 98.9 F (37.2 C)  TempSrc: Oral Oral    SpO2: 97% 98% 97% 97%  Weight:      Height:        Body mass index is 22.93  kg/m.   Physical Exam   Constitutional: Appears well-developed and well-nourished.  Psych: Blunted affect  Eyes: No scleral injection.  HENT: No OP obstruction.  Head: Normocephalic.  Respiratory: Effort normal, non-labored breathing.  Skin: WDI.   Neurologic Examination   Mental Status: Awake and alert. Abulic. Will perseverate with her first and then her last name Dollinger multiple times followed by Claudene several additional times) in response to orientation questions, with no other verbal responses. No spontaneous limb movements. Not following commands, but will visually track examiner's face. Her only other verbal output was at the end of the exam, when she asked the examiner can you turn up the volume on the TV. When examiner asked her why, she stated I can't hardly hear it.   Cranial Nerves: II: Blinks to threat in both temporal fields. PERRL. III,IV, VI: No ptosis. EOMI while tracking examiner visually. Eyes are conjugate. No nystagmus. V: Flinches to cold stimulus bilaterally VII: Face symmetric, but does not smile to command VIII: Hearing intact to voice IX,X: No hypophonia or hoarseness XI: Symmetric XII: Does not protrude tongue to command Motor: BUE: Does not follow commands to move, but when held up by examiner and then released, arms slowly drift back to bed without asymmetry. RLE: Does not move foot, or at knee or hip joints to repeated noxious stimulation. Tone increased at hip, decreased at knee.  LLE: Withdraws slowly to repeated noxious stimuli, with hip/knee flexion and ankle dorsiflexion as well as curling of toes.  Sensory: Reacts to touch in BUE and to noxious only in LLE, but not to touch or noxious in RLE.  Deep Tendon Reflexes: 1+ and symmetric bilateral biceps, brachioradialis and triceps. Unable to elicit patellar or achilles reflexes bilaterally.  Plantars: Right: MuteLeft: Downgoing Cerebellar: Unable to assess Gait: Unable to  assess  Labs/Imaging/Neurodiagnostic studies   CBC:  Recent Labs  Lab 2023/12/30 1439 2023/12/30 2009 12/04/23 0520 12/05/23 0439  WBC 7.1 7.1 6.5 7.8  NEUTROABS 5.9 5.4  --   --   HGB 11.0* 10.3*  10.4* 10.2*  HCT 34.1* 31.5* 32.1* 30.2*  MCV 96.9 96.9 97.0 96.2  PLT 86* 72* 64* 83*   Basic Metabolic Panel:  Lab Results  Component Value Date   NA 138 12/05/2023   K 2.9 (L) 12/05/2023   CO2 19 (L) 12/05/2023   GLUCOSE 91 12/05/2023   BUN 19 12/05/2023   CREATININE 0.73 12/05/2023   CALCIUM  9.6 12/05/2023   GFRNONAA >60 12/05/2023   GFRAA 94 04/29/2019   Lipid Panel:  Lab Results  Component Value Date   LDLCALC 136 (H) 04/26/2020   HgbA1c:  Lab Results  Component Value Date   HGBA1C 5.7 (H) 04/26/2020   Urine Drug Screen:     Component Value Date/Time   LABOPIA NONE DETECTED 12/03/2023 2125   COCAINSCRNUR NONE DETECTED 12/03/2023 2125   LABBENZ POSITIVE (A) 12/03/2023 2125   AMPHETMU NONE DETECTED 12/03/2023 2125   THCU NONE DETECTED 12/03/2023 2125   LABBARB NONE DETECTED 12/03/2023 2125    Alcohol Level     Component Value Date/Time   ETH 203 (H) 05/05/2023 1341   INR  Lab Results  Component Value Date   INR 1.5 (H) 12/04/2023   APTT  Lab Results  Component Value Date   APTT 36 12/04/2023     ASSESSMENT  68 y.o. female with a PMHx of seizures, anemia, chronic back pain, COPD, GERD, dyspnea, headache, HTN, HLD, insomnia, osteoarthritis, osteoporosis, tachycardia and vertigo who presented to the hospital on 10/14 with AMS in the context of SIRS/Sepsis/FUO. She had an LP in the ED with 0 WBC in tube 1, 7 WBC in tube 4, clear and colorless with normal total protein and slightly elevated glucose of 78 (serum glucose 4 hours prior to LP was elevated at 137). Neurology has been consulted to further assess the etiology for her AMS.  - Exam reveals an awake but severely abulic patient with minimal speech output who is not able to cooperate with most components  of the exam. No gross focal weakness appreciated.  - MRI brain w/wo contrast: Small residual subacute to chronic subdural hematomas overlying both cerebral convexities, left larger than right. No significant mass effect or midline shift. Chronic hemosiderin staining in the left frontal lobe, consistent with prior subarachnoid hemorrhage. Finding could potentially serve as a seizure focus. Underlying mild chronic microvascular ischemic disease. - EEG: Continuous slow, generalized and lateralized left hemisphere. This study is jersey of cortical dysfunction arising from left hemisphere likely secondary to underlying structural abnormality versus post-ictal state. Additionally there is generalized cerebral dysfunction (encephalopathy). No seizures or epileptiform discharges were seen throughout the recording. - Impression:  - The DDx for her AMS includes possible subclinical seizure at home +/- possible systemic infection. No evidence for meningitis on her LP.  - Regarding the possibility of subclinical seizures, she is prescribed Keppra  1500 mg BID and Zonegran 100 mg BID, but she has uncertain medication adherence. Husband stated that she drinks heavily a couple of times per week and therefore EtOH withdrawal may be playing a role (hypoactive delirium). 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  - She has a family history of dementia (mother). Therefore a possible underlying degenerative dementia could also be contributing to her symptoms and/or susceptibility to relatively minor insults causing AMS.  - Her  ammonia is elevated at 56 and AST is 65, with elevated total bilirubin of 1.9 as well. Therefore hepatic encephalopathy should also be considered.  - Wernicke's  encephalopathy is also possible. Due to her inability to participate fully with the exam, cannot determine if she has a subtle deficit of ocular motility or ataxia.   RECOMMENDATIONS  - Agree with thiamine  supplementation. Consider increasing for the next 3 days to 500 mg IV TID, then return to 100 mg every day.  - CIWA protocol - Inpatient seizure precautions.  - Hold her home Zonegran given her hyperammonemia. When ammonia levels stabilize, can consider restarting if she is anticipated to be abstinent of EtOH going forward. Will need repeat ammonia levels outpatient.  - Continue Keppra  at 1500 mg BID - Further work up to assess the etiology for her thrombocytopenia.  - Monitor ammonia levels daily. Continue lactulose.   ______________________________________________________________________    Bonney SHARK, Hoa Deriso, MD [facility]

## 2023-12-05 NOTE — Care Management Important Message (Signed)
 Important Message  Patient Details  Name: Andrea Bell MRN: [id number] Date of Birth: 01/17/1956   Important Message Given:  Yes - Medicare IM     Jahanna Raether W, CMA 12/05/2023, 1:13 PM

## 2023-12-05 NOTE — Evaluation (Signed)
 Clinical/Bedside Swallow Evaluation Patient Details  Name: Andrea Bell MRN: [id number] Date of Birth: [birth year]  Today's Date: 12/05/2023 Time: SLP Start Time (ACUTE ONLY): 1400 SLP Stop Time (ACUTE ONLY): 1455 SLP Time Calculation (min) (ACUTE ONLY): 55 min  Past Medical History:  Past Medical History:  Diagnosis Date   Allergy    Anemia    Chronic back pain    Complication of anesthesia    slow to wake   COPD (chronic obstructive pulmonary disease) (HCC)    2l home oxygen    Dyspnea    GERD (gastroesophageal reflux disease)    Headache    HTN (hypertension)    Hyperlipidemia    Insomnia    Insomnia    Osteoarthritis    Osteoporosis    Oxygen  deficiency    Tachycardia    Vertigo    none recently   Wears dentures    partial lower   Past Surgical History:  Past Surgical History:  Procedure Laterality Date   ABDOMINAL HYSTERECTOMY     BIOPSY  12/31/2022   Procedure: BIOPSY;  Surgeon: Unk Corinn Skiff, MD;  Location: [facility] ENDOSCOPY;  Service: Gastroenterology;;   COLONOSCOPY WITH PROPOFOL  N/A 12/31/2022   Procedure: COLONOSCOPY WITH PROPOFOL ;  Surgeon: Unk Corinn Skiff, MD;  Location: [facility] ENDOSCOPY;  Service: Gastroenterology;  Laterality: N/A;   CYST REMOVAL TRUNK     CYSTECTOMY     back and side of head   CYSTOSCOPY N/A 12/13/2015   Procedure: CYSTOSCOPY;  Surgeon: Glory High, MD;  Location: [facility] ORS;  Service: Gynecology;  Laterality: N/A;   ESOPHAGOGASTRODUODENOSCOPY (EGD) WITH PROPOFOL  N/A 12/31/2022   Procedure: ESOPHAGOGASTRODUODENOSCOPY (EGD) WITH PROPOFOL ;  Surgeon: Unk Corinn Skiff, MD;  Location: [facility] ENDOSCOPY;  Service: Gastroenterology;  Laterality: N/A;   HOT HEMOSTASIS  12/31/2022   Procedure: HOT HEMOSTASIS (ARGON PLASMA COAGULATION/BICAP);  Surgeon: Unk Corinn Skiff, MD;  Location: [facility]OSCOPY;  Service: Gastroenterology;;   LAPAROSCOPIC BILATERAL SALPINGO OOPHERECTOMY Bilateral 12/13/2015   Procedure: LAPAROSCOPIC BILATERAL  SALPINGO OOPHORECTOMY;  Surgeon: Glory High, MD;  Location: [facility] ORS;  Service: Gynecology;  Laterality: Bilateral;   LAPAROSCOPIC HYSTERECTOMY N/A 12/13/2015   Procedure: HYSTERECTOMY TOTAL LAPAROSCOPIC;  Surgeon: Glory High, MD;  Location: [facility] ORS;  Service: Gynecology;  Laterality: N/A;   tear duct surgery     bilateral   TUBAL LIGATION     HPI:  Pt is a 68 y.o. 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 History from husband is that following the seizure she has been confused, weak, shaking and mumbling which is far from her baseline.  PER CHART/MD NOTE: Pt remains Encephalopathic but responsive. Per Husband, she is a heavy drinker about 2 times per week, unsure last drink, question EtOH Withdrawal to explain presentation including seizure and fever and Confusion.  Pt is on CIWA protocol. 10/16: ammonia slightly increased still per chart note.   CT Chest on 11/19/2023:  Centrilobular and paraseptal Emphysema. Mild basilar  subpleural coarsened ground-glass and scattered reticular densities.  Calcified granulomas.    CXR on 12/03/2023:   No acute cardiopulmonary abnormality.  2. Age-indeterminate fracture of the distal right clavicle, likely  chronic.   MRI: Small residual subacute to chronic subdural hematomas overlying both  cerebral convexities, left larger than right. No significant mass effect or  midline shift.  2. Chronic hemosiderin staining in  the left frontal lobe, consistent with prior  subarachnoid hemorrhage. Finding could potentially serve as a seizure focus.  3. No other acute intracranial abnormality.  4. Underlying mild chronic microvascular ischemic disease.    Assessment / Plan / Recommendation  Clinical Impression   Pt seen for BSE today. pt awakened  w/ liight verbal cues; pt demonstrates significantly decreased awareness of her environment and poor orientation(did not ID her first name nor her husband's; not oriented to place, time). Verbal communication was mumbled/muttered w/ reduced intelligibility except for sporadic and spontaneous statements that's really good and I need more of that when referring to ice cream and juice(she consumed).   OF NOTE IN PT'S CHART HISTORY:   A [facility] SLP note during an admit 05/2023-  COGNITION: Pt presents with an overall Severe Cognitive-communication impairment, characterized by severe deficits in attention, memory, problem solving, safety/judgement, executive function, verbal expression, and visual processing. Overall a moderate-severe impairment noted in orientation. SLP to follow per POC..  Pt stopped therapy later in the month NOT meeting her Cognitive-communication goals fully requiring MOD assist in the home at D/C.  Originally, she admitted at [facility] mid-March w/ presentation of recurrent traumatic subdural hematomas and symptomatic seizures who was admitted to [facility] on 05/06/2023 for seizure-like activity, twitching, and not returning to baseline. and seizure-like episode in the setting of ETOH consumption in the morning = per chart notes.  Pt appears to present w/ grossly functional oropharyngeal phase swallowing w/ No gross oropharyngeal phase dysphagia noted, No neuromuscular deficits noted. Pt has Slow processing, motor function, and attention to task which appeared to impact her oral prep stage and mastication(min). Pt consumed po trials w/ No overt, clinical s/s of aspiration during po trials.  Pt appears at reduced risk for aspiration when following general aspiration precautions and when given 100% Supervision and support during po tasks -- pt is able to self-feed but needs setup and support d/t Cognitive decline/functioning. Pt has challenging factors that could impact her oropharyngeal swallowing  to include illness/hospitalization, deconditioning/weakness, need for feeding Supervision/Support at meals, and Slow processing, motor function, and attention to task impacting her oral prep stage. These factors can increase risk for dysphagia as well as decreased oral intake overall.   During po trials, pt consumed all consistencies w/ no overt coughing, decline in vocal quality, or change in respiratory presentation during/post trials. O2 sats 100% when checked. Oral phase appeared grossly [facility] w/ timely bolus management(using small bites), mastication, and control of bolus propulsion for A-P transfer for swallowing. Oral clearing achieved w/ all trial consistencies -- moistened, soft foods given. OM Exam appeared [facility] w/ no unilateral weakness noted. Speech Clear during few spontaneous responses. Pt fed self w/ MOD++ setup and support secondary to the UE/hand tremors and the Cognitive-communication decline(some of which is Baseline per chart history).   Recommend a more Mech Soft consistency diet w/ well-Cut meats, moistened foods; Thin liquids -- monitor straw use, and pt should help to Hold Cup when drinking. Recommend general aspiration precautions including Small bites/sips Slowly, oral clearing b/t bites, and reduce distractions during meals. Feeding Support and Supervision at meals secondary to Cognitive-communication decline. Pills CRUSHED vs WHOLE in Puree for safer, easier swallowing. Reflux precautions d/t ETOH use.   Education attempted re: Pills in Puree; food consistencies and easy to eat options; general aspiration precautions to pt -- unsure of comprehension(will need further f/u w/ Family during admit) and NSG.   No immediate, Acute ST needs for swallowing but potential need  for f/u w/ Cognitive-communication assessment either while inpatient or at Discharge in the Outpatient setting -- pt has KNOWN Cognitive-communication Deficits and ETOH Use per chart impacting her Cognition. NSG to  reconsult if any new needs arise. NSG updated, agreed. MD updated. Recommend Dietician f/u for support. SLP Visit Diagnosis: Dysphagia, unspecified (R13.10) (in setting of declined mental status and Baseline Cognitive-communication deficits)    Aspiration Risk   (reduced when following general aspiration precautions w/ support at meals d/t Cognition)    Diet Recommendation   Thin;Dysphagia 3 (mechanical soft) (d/t UE tremors and inability to cut foods well; decreased attention w/ tasks and slower processing) = a more Mech Soft consistency diet w/ well-Cut meats, moistened foods; Thin liquids -- monitor straw use, and pt should help to Hold Cup when drinking. Recommend general aspiration precautions including Small bites/sips Slowly, oral clearing b/t bites, and reduce distractions during meals. Feeding Support and Supervision at meals secondary to Cognitive-communication decline. Reflux precautions d/t ETOH use.   Medication Administration: Crushed with puree (for safety)    Other  Recommendations Recommended Consults:  (Dietician) Oral Care Recommendations: Oral care BID;Staff/trained caregiver to provide oral care (support pt)     Assistance Recommended at Discharge  FULL d/t Cognitive decline  Functional Status Assessment Patient has had a recent decline in their functional status and demonstrates the ability to make significant improvements in function in a reasonable and predictable amount of time.  Frequency and Duration  (n/a)   (n/a)       Prognosis Prognosis for improved oropharyngeal function: Good Barriers to Reach Goals: Cognitive deficits;Language deficits;Time post onset;Severity of deficits (Baseline) Barriers/Prognosis Comment: Cognitive-linguistic impairment and ETOH use Baseline      Swallow Study   General Date of Onset: 12/03/23 HPI: Pt is a 68 y.o. 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 History from husband is that following the seizure she has been confused, weak, shaking and mumbling which is far from her baseline.  PER CHART/MD NOTE: Pt remains Encephalopathic but responsive. Per Husband, she is a heavy drinker about 2 times per week, unsure last drink, question EtOH Withdrawal to explain presentation including seizure and fever and Confusion.  Pt is on CIWA protocol. 10/16: ammonia slightly increased still per chart note.   CT Chest on 11/19/2023:  Centrilobular and paraseptal Emphysema. Mild basilar  subpleural coarsened ground-glass and scattered reticular densities.  Calcified granulomas.    CXR on 12/03/2023:   No acute cardiopulmonary abnormality.  2. Age-indeterminate fracture of the distal right clavicle, likely  chronic.   MRI: Small residual subacute to chronic subdural hematomas overlying both  cerebral convexities, left larger than right. No significant mass effect or  midline shift.  2. Chronic hemosiderin staining in the left frontal lobe, consistent with prior  subarachnoid hemorrhage. Finding could potentially serve as a seizure focus.  3. No other acute intracranial abnormality.  4. Underlying mild chronic microvascular ischemic disease. Type of Study: Bedside Swallow Evaluation Previous Swallow Assessment: 3-05/2023 when admitted at [facility] - regular diet, thins Diet Prior to this Study: NPO Temperature Spikes Noted: No (wbc 7.8) Respiratory Status: Room air History of Recent Intubation: No Behavior/Cognition: Alert;Cooperative;Pleasant mood;Confused;Distractible;Requires cueing (BASELINE COGNITIVE-COMMUNICATION DEFICITS PER CHART (see [facility] notes 05/2023)) Oral Cavity Assessment: Dry (min) Oral Care Completed by SLP: Yes Oral Cavity - Dentition:  Adequate natural dentition;Missing dentition (few lower molars) Vision:  Functional for self-feeding Self-Feeding Abilities: Able to feed self;Needs assist;Needs set up;Total assist (reduced proprioceptive awareness; UE tremors) Patient Positioning: Upright in bed (FULL assist) Baseline Vocal Quality: Low vocal intensity Volitional Cough: Cognitively unable to elicit Volitional Swallow: Unable to elicit    Oral/Motor/Sensory Function Overall Oral Motor/Sensory Function: Within functional limits   Ice Chips Ice chips: Within functional limits Presentation: Spoon (fed; 3 trials)   Thin Liquid Thin Liquid: Within functional limits Presentation: Cup;Self Fed;Straw (~8 ozs total) Other Comments: water, juice -- hand tremors    Nectar Thick Nectar Thick Liquid: Not tested   Honey Thick Honey Thick Liquid: Not tested   Puree Puree: Within functional limits Presentation: Spoon;Self Fed (supported; ~3 ozs)   Solid     Solid: Within functional limits (grossly - min decreased attention during task) Presentation: Spoon;Self Fed (supported 8 trials) Other Comments: slow motor movements and processing overall        Ign Manoj Enriquez 12/05/2023,4:22 PM

## 2023-12-06 DIAGNOSIS — A419 Sepsis, unspecified organism: Secondary | ICD-10-CM | POA: Diagnosis not present

## 2023-12-06 LAB — PROCALCITONIN: Procalcitonin: 1.34 ng/mL

## 2023-12-06 LAB — ADAMTS13 ACTIVITY: Adamts 13 Activity: 73.8 % (ref >66.8)

## 2023-12-06 LAB — ADAMTS13 ACTIVITY REFLEX

## 2023-12-06 MED ORDER — BUDESON-GLYCOPYRROL-FORMOTEROL 160-9-4.8 MCG/ACT IN AERO
2.0000 | INHALATION_SPRAY | Freq: Two times a day (BID) | RESPIRATORY_TRACT | Status: DC
  Administered 2023-12-06 – 2023-12-09 (×6): 2 via RESPIRATORY_TRACT
  Filled 2023-12-06: qty 5.9

## 2023-12-06 MED ORDER — THIAMINE HCL 100 MG/ML IJ SOLN
500.0000 mg | Freq: Three times a day (TID) | INTRAMUSCULAR | Status: AC
  Administered 2023-12-06 – 2023-12-08 (×9): 500 mg via INTRAVENOUS
  Filled 2023-12-06 (×9): qty 5

## 2023-12-06 MED ORDER — LEVETIRACETAM 500 MG PO TABS
1500.0000 mg | ORAL_TABLET | Freq: Two times a day (BID) | ORAL | Status: DC
  Administered 2023-12-06 – 2023-12-09 (×6): 1500 mg via ORAL
  Filled 2023-12-06 (×6): qty 3

## 2023-12-06 NOTE — Progress Notes (Signed)
 PROGRESS NOTE    Andrea Bell   FMW:[id number] DOB: January 09, 1956  DOA: 12/03/2023 Date of Service: 12/06/23 which is hospital day 3  PCP: Lora Odor, [facility] course / significant events:   HPI:  Andrea Bell is a 68 y.o. 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 History from husband is that following the seizure she has been confused, weak, shaking and mumbling which is far from her baseline   10/14: to ED. Fever 102.7, HR 108, lumbar puncture performed, CT head non-acute, started on sepsis fluids and broad spectrum abx, admitted for SIRS/Sepsis FUO. CT abd/pelv (+)hepatic steatosis/cirrhosis and ammonia slightly up, large stool burden likely impacted, procal up,  10/15: still npo pending swallow eval, if able to take lactulose will administer this otherwise will need enema / disimpaction. EEG pending. Remains encephalopathic but responsive, follows commands. Medic in ED reported dark stool but no obvious melena, pt is on iron  supplements, hemoccult ordered prn. Spoke w/ husband, she is a heavy drinker about 2 times per week, unsure last drink, question EtOH withdrawal to explain symptoms including seizure and fever, on CIWA. 10/16: ammonia slight increase. MRI brain no mass effect or new bleed, question old hemosiderin staining on L frontal prior subarachnoid hemorrhage may be seizure focus. SLP eval - significant cognitive deficits appear stable/similar compared to previous SLP eval 05/2023, minor aspiration risk mostly d/t cognition. Neurology consult - dc zongran, cont keppra , give thiamine  500 mg iv tid x 3d.  10/17: remains same/stable, husband reports she seems a lot better today. Anticipate dc abx if procal going down and monitor thru the weekend off  abx, PT/OT to see, expect family will decide for home w/ home health depending on how pt does      Consultants:  none  Procedures/Surgeries: Lumbar puncture in ED      ASSESSMENT & PLAN:   Acute metabolic encephalopathy, multifactorial Awake but severely abulic patient with minimal speech output who is not able to cooperate with most components of the exam. No gross focal weakness appreciated.  Ddx:  sepsis but no obvious infectious source found Seizure but doubt prolonged postictal EtOH component / withdrawals / Wernicke's hepatic encephalopathy / hyperammonemia  Advancing vascular dementia. Appreciate neurology consult  Neurologic checks  fall and aspiration precautions See below treating individual potential cause(s)  SIRS/sepsis but no obvious infectious source found Sepsis, unknown source Question SIRS without infection   Fever unknown origin Sepsis criteria include fever, tachycardia albeit with normal WBC and lactic acid. No obvious infectious source, have reliably r/o meningitis as well.  Continue broad-spectrum antibiotics to treat sepsis of unknown source, consider narrow/dc if procal trend down   Seizure hx in setting of previous SDH/SAH and TBI, but doubt prolonged postictal Per neurology Regarding the possibility of subclinical seizures, she is prescribed Keppra  1500 mg BID and Zonegran 100 mg BID, but she has uncertain medication adherence. Husband stated that she drinks heavily a couple of times per week and therefore EtOH withdrawal may be playing a role (hypoactive delirium). 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 Holding home Zonegran given hyperammoniemia, consider  restart if/when ammonia levels normalize IF can  confirm continued EtOH abstinence Keppra  1500 mg bid   EtOH use question withdrawal / Wernicke's Spoke w/ husband, she is a heavy drinker about 2 times per week, unsure last drink, question EtOH withdrawal to explain symptoms including seizure and fever  Per neurology: Wernicke's encephalopathy is also possible. Due to her inability to participate fully with the exam, cannot determine if she has a subtle deficit of ocular motility or ataxia. CIWA Thiamine  500 mg IV tid x3 days then ok to return to 100 mg daily  Hepatic encephalopathy Cirrhosis and hepatic steatosis Mild ammonia increase - worsening Holding home Zonegran given hyperammoniemia, consider restart if/when ammonia levels normalize IF can confirm continued EtOH abstinence Lactulose  Monitor ammonia   Advancing vascular dementia. family history of dementia (mother). Therefore a possible underlying degenerative dementia could also be contributing to her symptoms and/or susceptibility to relatively minor insults causing AMS.  Supportive care    Thrombocytopenia Platelets 86,000 10/14 down from baseline of 138,000 --> 64000 10/15 --> 84000  Likely secondary to severe sepsis complicated by cirrhosis,  In the absence of leukocytosis and lactic acid we will explore other potential etiologies, rule out DIC Hematologic workup --> normal smear, elevated LDH, elevated D-dimer, normal fibrinogen   Trend CBC  Elevated D-Dimer, Wells 0 Low suspicion for PE in absence of major risk factors, tachycardia, chest pain, hypoxia, resp distress Done for DIC w/u as above Will defer imaging for now   Moderate rectal stool burden, suggesting mild fecal impaction. Early stercoral colitis is possible. - clinically significant imaging findings Chronic constipation   Medic in ED reported dark stool but no obvious melena, pt is on iron  supplements, hemoccult ordered prn.  Chronic constipation per husband Abx as above would cover colitis  Lactulose  po if possible  Pt did have bowel movement yesterday, monitor I&O   Iron  deficiency anemia, unspecified H&H stable Continue to monitor   Essential hypertension, benign Holding antihypertensives in the setting of sepsis Hydralazine IV as needed   COPD (chronic obstructive pulmonary disease)  Not acutely exacerbated DuoNebs as needed    No concerns based on BMI: Body mass index is 22.93 kg/m.SABRA Significantly low or high BMI is associated with higher medical risk.  Underweight - under 18  overweight - 25 to 29 obese - 30 or more Class 1 obesity: BMI of 30.0 to 34 Class 2 obesity: BMI of 35.0 to 39 Class 3 obesity: BMI of 40.0 to 49 Super Morbid Obesity: BMI 50-59 Super-super Morbid Obesity: BMI 60+ Healthy nutrition and physical activity advised as adjunct to other disease management and risk reduction treatments    DVT prophylaxis: hold Rx d/t low Plt  IV fluids: LR 100 cc/h continuous IV fluids  Nutrition: pending swallow eval  Central lines / other devices: none  Code Status: FULL CODE ACP documentation reviewed: none on file in [facility]  TOC needs: TBD Medical barriers to dispo: encephalopathic, needing abx. Expected medical readiness for discharge pend clinical course, at least several days.              Subjective / Brief ROS:  Patient reports no complaints - she awakens to voice and answers questions but is difficult to understand. Same as yesterday and day prior though today seems more awake..She follows commands see physical exam.    Family Communication: husband at bedside on morning rounds, discussed plan and he has no further questions     Objective Findings:  Vitals:  12/05/23 1958 12/06/23 0019 12/06/23 0333 12/06/23 1201  BP: 121/60 126/62 117/62 121/61  Pulse: 95 91 91 85  Resp: 18 18 15 16   Temp: 99.4 F (37.4 C) 99.9 F (37.7 C) 98.8 F (37.1 C) 98.5 F (36.9 C)  TempSrc:      SpO2: 98% 97% 98% 98%  Weight:      Height:         Intake/Output Summary (Last 24 hours) at 12/06/2023 1401 Last data filed at 12/06/2023 0400 Gross per 24 hour  Intake 45 ml  Output --  Net 45 ml   Filed Weights   12/03/23 1432  Weight: 66.4 kg    Examination:  Physical Exam Constitutional:      General: She is not in acute distress.    Appearance: She is ill-appearing.  Cardiovascular:     Rate and Rhythm: Normal rate and regular rhythm.  Pulmonary:     Effort: Pulmonary effort is normal.     Breath sounds: Normal breath sounds.  Abdominal:     General: Bowel sounds are normal. There is distension.     Palpations: Abdomen is soft.     Tenderness: There is no guarding or rebound.  Musculoskeletal:     Right lower leg: No edema.     Left lower leg: No edema.  Skin:    General: Skin is warm and dry.  Neurological:     Mental Status: She is alert. She is disoriented.  Psychiatric:        Behavior: Behavior normal.          Scheduled Medications:   budesonide -glycopyrrolate-formoterol   2 puff Inhalation BID   lactulose  20 g Oral Daily   levETIRAcetam   1,500 mg Oral BID    Continuous Infusions:  cefTRIAXone (ROCEPHIN)  IV 2 g (12/05/23 1701)   thiamine  (VITAMIN B1) injection 500 mg (12/06/23 1016)    PRN Medications:  acetaminophen  **OR** acetaminophen , albuterol , LORazepam , LORazepam  **OR** LORazepam , ondansetron  **OR** ondansetron  (ZOFRAN ) IV  Antimicrobials from admission:  Anti-infectives (From admission, onward)    Start     Dose/Rate Route Frequency Ordered Stop   12/05/23 0800  vancomycin (VANCOREADY) IVPB 1500 mg/300 mL  Status:  Discontinued        1,500 mg 150 mL/hr over 120 Minutes Intravenous Every 24 hours 12/04/23 1015 12/04/23 1017   12/04/23 1800  cefTRIAXone (ROCEPHIN) 2 g in sodium chloride  0.9 % 100 mL IVPB        2 g 200 mL/hr over 30 Minutes Intravenous Daily-1800 12/04/23 1018     12/04/23 0800  vancomycin (VANCOREADY) IVPB 750 mg/150 mL  Status:  Discontinued        750  mg 150 mL/hr over 60 Minutes Intravenous Every 12 hours 12/03/23 2339 12/04/23 1015   12/04/23 0600  metroNIDAZOLE (FLAGYL) IVPB 500 mg  Status:  Discontinued        500 mg 100 mL/hr over 60 Minutes Intravenous Every 12 hours 12/03/23 2155 12/04/23 1017   12/04/23 0000  ceFEPIme (MAXIPIME) 2 g in sodium chloride  0.9 % 100 mL IVPB  Status:  Discontinued        2 g 200 mL/hr over 30 Minutes Intravenous Every 8 hours 12/03/23 2226 12/04/23 1017   12/03/23 2230  vancomycin (VANCOREADY) IVPB 500 mg/100 mL        500 mg 100 mL/hr over 60 Minutes Intravenous  Once 12/03/23 2224 12/04/23 0348   12/03/23 2200  vancomycin (VANCOCIN) IVPB 1000 mg/200 mL premix  Status:  Discontinued        1,000 mg 200 mL/hr over 60 Minutes Intravenous  Once 12/03/23 2155 12/03/23 2224   12/03/23 1530  ceFEPIme (MAXIPIME) 2 g in sodium chloride  0.9 % 100 mL IVPB        2 g 200 mL/hr over 30 Minutes Intravenous  Once 12/03/23 1525 12/03/23 1636   12/03/23 1530  metroNIDAZOLE (FLAGYL) IVPB 500 mg        500 mg 100 mL/hr over 60 Minutes Intravenous  Once 12/03/23 1525 12/03/23 1822   12/03/23 1530  vancomycin (VANCOCIN) IVPB 1000 mg/200 mL premix        1,000 mg 200 mL/hr over 60 Minutes Intravenous  Once 12/03/23 1525 12/03/23 2001           Data Reviewed:  I have personally reviewed the following...  CBC: Recent Labs  Lab 12/03/23 1439 12/03/23 2009 12/04/23 0520 12/05/23 0439  WBC 7.1 7.1 6.5 7.8  NEUTROABS 5.9 5.4  --   --   HGB 11.0* 10.3* 10.4* 10.2*  HCT 34.1* 31.5* 32.1* 30.2*  MCV 96.9 96.9 97.0 96.2  PLT 86* 72* 64* 83*   Basic Metabolic Panel: Recent Labs  Lab 12/03/23 1439 12/04/23 0555 12/04/23 1633 12/05/23 0439  NA 136 137  --  138  K 3.4* 2.9*  --  2.9*  CL 99 104  --  107  CO2 22 22  --  19*  GLUCOSE 137* 105*  --  91  BUN 23 21  --  19  CREATININE 0.74 0.61  --  0.73  CALCIUM  10.3 9.6  --  9.6  MG  --   --  1.2* 1.8   GFR: Estimated Creatinine Clearance: 66.4  mL/min (by C-G formula based on SCr of 0.73 mg/dL). Liver Function Tests: Recent Labs  Lab 12/03/23 1439 12/04/23 0555 12/05/23 0439  AST 101* 107* 65*  ALT 25 29 25   ALKPHOS 155* 113 117  BILITOT 2.7* 2.1* 1.9*  PROT 8.3* 6.7 6.4*  ALBUMIN 4.5 3.2* 3.3*   No results for input(s): LIPASE, AMYLASE in the last 168 hours. Recent Labs  Lab 12/03/23 2125 12/04/23 1633 12/05/23 0439  AMMONIA 46* 47* 56*   Coagulation Profile: Recent Labs  Lab 12/03/23 1439 12/04/23 0520  INR 1.3* 1.5*   Cardiac Enzymes: No results for input(s): CKTOTAL, CKMB, CKMBINDEX, TROPONINI in the last 168 hours. BNP (last 3 results) No results for input(s): PROBNP in the last 8760 hours. HbA1C: No results for input(s): HGBA1C in the last 72 hours. CBG: No results for input(s): GLUCAP in the last 168 hours. Lipid Profile: No results for input(s): CHOL, HDL, LDLCALC, TRIG, CHOLHDL, LDLDIRECT in the last 72 hours. Thyroid  Function Tests: No results for input(s): TSH, T4TOTAL, FREET4, T3FREE, THYROIDAB in the last 72 hours. Anemia Panel: No results for input(s): VITAMINB12, FOLATE, FERRITIN, TIBC, IRON , RETICCTPCT in the last 72 hours. Most Recent Urinalysis On File:     Component Value Date/Time   COLORURINE YELLOW (A) 12/03/2023 1436   APPEARANCEUR CLEAR (A) 12/03/2023 1436   APPEARANCEUR Clear 02/05/2013 1159   LABSPEC 1.016 12/03/2023 1436   LABSPEC 1.024 02/05/2013 1159   PHURINE 8.0 12/03/2023 1436   GLUCOSEU NEGATIVE 12/03/2023 1436   GLUCOSEU Negative 02/05/2013 1159   HGBUR SMALL (A) 12/03/2023 1436   BILIRUBINUR NEGATIVE 12/03/2023 1436   BILIRUBINUR Negative 02/05/2013 1159   KETONESUR 20 (A) 12/03/2023 1436   PROTEINUR 30 (A) 12/03/2023 1436   NITRITE NEGATIVE 12/03/2023 1436  LEUKOCYTESUR NEGATIVE 12/03/2023 1436   LEUKOCYTESUR Negative 02/05/2013 1159   Sepsis  Labs: @LABRCNTIP (procalcitonin:4,lacticidven:4) Microbiology: Recent Results (from the past 240 hours)  Blood culture (routine x 2)     Status: None (Preliminary result)   Collection Time: 12/03/23  2:39 PM   Specimen: BLOOD  Result Value Ref Range Status   Specimen Description BLOOD RIGHT ANTECUBITAL  Final   Special Requests   Final    BOTTLES DRAWN AEROBIC AND ANAEROBIC Blood Culture results may not be optimal due to an inadequate volume of blood received in culture bottles   Culture   Final    NO GROWTH 3 DAYS Performed at [facility], [address] [place], [place] [address]    Report Status PENDING  Incomplete  Resp panel by RT-PCR (RSV, Flu A&B, Covid) Anterior Nasal Swab     Status: None   Collection Time: 12/03/23  2:39 PM   Specimen: Anterior Nasal Swab  Result Value Ref Range Status   SARS Coronavirus 2 by RT PCR NEGATIVE NEGATIVE Final    Comment: (NOTE) SARS-CoV-2 target nucleic acids are NOT DETECTED.  The SARS-CoV-2 RNA is generally detectable in upper respiratory specimens during the acute phase of infection. The lowest concentration of SARS-CoV-2 viral copies this assay can detect is 138 copies/mL. A negative result does not preclude SARS-Cov-2 infection and should not be used as the sole basis for treatment or other patient management decisions. A negative result may occur with  improper specimen collection/handling, submission of specimen other than nasopharyngeal swab, presence of viral mutation(s) within the areas targeted by this assay, and inadequate number of viral copies(<138 copies/mL). A negative result must be combined with clinical observations, patient history, and epidemiological information. The expected result is Negative.  Fact Sheet for Patients:  BloggerCourse.com  Fact Sheet for Healthcare Providers:  SeriousBroker.it  This test is no t yet approved or cleared by the [place] FDA and  has been authorized for detection and/or diagnosis of SARS-CoV-2 by FDA under an Emergency Use Authorization (EUA). This EUA will remain  in effect (meaning this test can be used) for the duration of the COVID-19 declaration under Section 564(b)(1) of the Act, 21 U.S.C.section 360bbb-3(b)(1), unless the authorization is terminated  or revoked sooner.       Influenza A by PCR NEGATIVE NEGATIVE Final   Influenza B by PCR NEGATIVE NEGATIVE Final    Comment: (NOTE) The Xpert Xpress SARS-CoV-2/FLU/RSV plus assay is intended as an aid in the diagnosis of influenza from Nasopharyngeal swab specimens and should not be used as a sole basis for treatment. Nasal washings and aspirates are unacceptable for Xpert Xpress SARS-CoV-2/FLU/RSV testing.  Fact Sheet for Patients: BloggerCourse.com  Fact Sheet for Healthcare Providers: SeriousBroker.it  This test is not yet approved or cleared by the [place]  FDA and has been authorized for detection and/or diagnosis of SARS-CoV-2 by FDA under an Emergency Use Authorization (EUA). This EUA will remain in effect (meaning this test can be used) for the duration of the COVID-19 declaration under Section 564(b)(1) of the Act, 21 U.S.C. section 360bbb-3(b)(1), unless the authorization is terminated or revoked.     Resp Syncytial Virus by PCR NEGATIVE NEGATIVE Final    Comment: (NOTE) Fact Sheet for Patients: BloggerCourse.com  Fact Sheet for Healthcare Providers: SeriousBroker.it  This test is not yet approved or cleared by the [place]  FDA and has been authorized for detection and/or diagnosis of SARS-CoV-2 by FDA under an Emergency Use Authorization (EUA).  This EUA will remain in effect (meaning this test can be used) for the duration of the COVID-19 declaration under Section 564(b)(1) of the Act, 21 U.S.C. section  360bbb-3(b)(1), unless the authorization is terminated or revoked.  Performed at [facility], [address] [place], [place] [address]   Blood culture (routine x 2)     Status: None (Preliminary result)   Collection Time: 12/03/23  3:05 PM   Specimen: BLOOD  Result Value Ref Range Status   Specimen Description BLOOD BLOOD RIGHT HAND  Final   Special Requests   Final    BOTTLES DRAWN AEROBIC AND ANAEROBIC Blood Culture results may not be optimal due to an inadequate volume of blood received in culture bottles   Culture   Final    NO GROWTH 3 DAYS Performed at [facility], [address] [place], [place] [address]    Report Status PENDING  Incomplete  CSF culture w Gram Stain     Status: None (Preliminary result)   Collection Time: 12/03/23  6:56 PM   Specimen: CSF; Cerebrospinal Fluid  Result Value Ref Range Status   Specimen Description   Final    CSF Performed at [facility], [address], [place], [place] [address]    Special Requests   Final    NONE Performed at [facility], [address] [place], [place] [address]    Gram Stain   Final    NO ORGANISMS SEEN WBC SEEN RED BLOOD CELLS PRESENT GRAM STAIN REVIEWED-AGREE WITH RESULT [id number] AT 0029, ADC    Culture   Final    NO GROWTH 3 DAYS Performed at [facility], [address] [place], [place] [address]    Report Status PENDING  Incomplete  Respiratory (~20 pathogens) panel by PCR     Status: None   Collection Time: 12/04/23 12:18 AM   Specimen: Nasopharyngeal Swab; Respiratory  Result Value Ref Range Status   Adenovirus NOT DETECTED NOT DETECTED Final   Coronavirus 229E NOT DETECTED NOT DETECTED Final    Comment: (NOTE) The Coronavirus on the Respiratory Panel, DOES NOT test for the novel  Coronavirus (2019 nCoV)    Coronavirus HKU1 NOT DETECTED NOT DETECTED Final   Coronavirus NL63 NOT DETECTED NOT DETECTED Final   Coronavirus OC43 NOT DETECTED NOT  DETECTED Final   Metapneumovirus NOT DETECTED NOT DETECTED Final   Rhinovirus / Enterovirus NOT DETECTED NOT DETECTED Final   Influenza A NOT DETECTED NOT DETECTED Final   Influenza B NOT DETECTED NOT DETECTED Final   Parainfluenza Virus 1 NOT DETECTED NOT DETECTED Final   Parainfluenza Virus 2 NOT DETECTED NOT DETECTED Final   Parainfluenza Virus 3 NOT DETECTED NOT DETECTED Final   Parainfluenza Virus 4 NOT DETECTED NOT DETECTED Final   Respiratory Syncytial Virus NOT DETECTED NOT DETECTED Final   Bordetella pertussis NOT DETECTED NOT DETECTED Final   Bordetella Parapertussis NOT DETECTED NOT DETECTED Final   Chlamydophila pneumoniae NOT DETECTED NOT DETECTED Final   Mycoplasma pneumoniae NOT DETECTED NOT DETECTED Final    Comment: Performed at [facility], [address] [place], [place] [address]      Radiology Studies last 3 days: MR BRAIN W WO CONTRAST Result Date: 12/05/2023 EXAM: MRI BRAIN WITH AND WITHOUT CONTRAST 12/04/2023 11:53:14 PM TECHNIQUE: Multiplanar multisequence MRI of the head/brain was performed with and without the administration of 6 mL gadobutrol (GADAVIST) 1 MMOL/ML injection. COMPARISON: Comparison with prior head CT from 12/03/2023  as well as previous CT from 05/05/2023. CLINICAL HISTORY: Seizure, abnormal EEG with concern for localized etiology. Best images obtainable due to patient motion, unable to follow coaching on motion. 6 mL gadavist. 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 EEG to evaluate for seizure. FINDINGS: BRAIN AND VENTRICLES: Cerebral volume within normal limits. Patchy T2/FLAIR hypertensity involving the periventricular and deep white matter of both cerebral hemispheres, consistent with chronic cerebral ischemic disease, mild for age. Superimposed remote  lacunar infarct present at the posterior left frontal corona radiata. Symmetric increased T1 signal intensity noted about the bilateral basal ganglia, nonspecific but most commonly related to intrinsic liver disease. No evidence for acute or subacute infarct. No signal changes of acute seizure. No areas of chronic cortical infarction. Subdural collection overlying the left cerebral convexity is seen, measuring up to 4 mm in maximal thickness. Additional trace right sided subdural collection measuring 1 to 2 mm in thickness. Findings most likely reflect small residual subacute to chronic subdural hematomas. This was acute in nature on prior CT from 05/05/2023. Additionally, there is chronic hemosiderin staining involving the left frontal lobe, consistent with prior subarachnoid hemorrhage at this location (series 12, image 42). No acute blood product seen at this location on prior head CT. Finding could serve as a seizure focus. No mass lesion or midline shift. No hydrocephalus. Pituitary gland within normal limits. No intrinsic temporal lobe abnormality. Smooth pachymeningeal thickening seen about both cerebral hemispheres, likely reactive. No other abnormal enhancement. Normal flow voids. ORBITS: No acute abnormality. SINUSES: Mild scattered mucosal thickening present about the sphenoethmoidal sinuses. BONES AND SOFT TISSUES: Decreased T1 signal intensity noted within the visualized bone marrow, nonspecific, but most commonly related to anemia, smoking, or BCD. No acute soft tissue abnormality. IMPRESSION: 1. Small residual subacute to chronic subdural hematomas overlying both cerebral convexities, left larger than right. No significant mass effect or midline shift. 2. Chronic hemosiderin staining in the left frontal lobe, consistent with prior subarachnoid hemorrhage. Finding could potentially serve as a seizure focus. 3. No other acute intracranial abnormality. 4. Underlying mild chronic microvascular ischemic  disease. Electronically signed by: Morene Hoard MD 12/05/2023 12:27 AM EDT RP Workstation: [id number]   EEG adult Result Date: 12/04/2023 Shelton Arlin KIDD, MD     12/04/2023  4:44 PM Patient Name: Sharron Petruska MRN: [id number] Epilepsy Attending: Arlin KIDD Shelton Referring Physician/Provider: Cleatus Delayne GAILS, MD Date: 12/04/2023 Duration: 28.24 mins Patient history: 68 y.o. 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 EEG to evaluate for seizure Level of alertness: Awake AEDs during EEG study: LEV Technical aspects: This EEG study was done with scalp electrodes positioned according to the 10-20 International system of electrode placement. Electrical activity was reviewed with band pass filter of 1-70Hz , sensitivity of 7 uV/mm, display speed of 59mm/sec with a 60Hz  notched filter applied as appropriate. EEG data were recorded continuously and digitally stored.  Video monitoring was available and reviewed as appropriate. Description: The posterior dominant rhythm consists of 7 Hz activity of moderate voltage (25-35 uV) seen predominantly in posterior head regions, asymmetric ( left<right) and reactive to eye opening and eye closing. EEG showed continuous 3 to 6 Hz theta-delta slowing in left hemisphere as well as  5-7hz  theta-delta slowing in right  hemisphere. Hyperventilation and photic stimulation were not performed.   ABNORMALITY - Continuous slow, generalized and lateralized left hemisphere IMPRESSION: This study is jersey of cortical dysfunction arising from left hemisphere likely secondary to underlying structural abnormality, post-ictal state. Additionally there is generalized cerebral dysfunction ( encephalopathy). No seizures or epileptiform discharges were seen throughout the recording. Priyanka MALVA Krebs   CT ABDOMEN PELVIS WO  CONTRAST Result Date: 12/03/2023 EXAM: CT ABDOMEN AND PELVIS WITHOUT CONTRAST 12/03/2023 09:48:47 PM TECHNIQUE: CT of the abdomen and pelvis was performed without the administration of intravenous contrast. Multiplanar reformatted images are provided for review. Automated exposure control, iterative reconstruction, and/or weight-based adjustment of the mA/kV was utilized to reduce the radiation dose to as low as reasonably achievable. COMPARISON: PET CT dated 06/24/2019. CLINICAL HISTORY: Sepsis. FINDINGS: LOWER CHEST: No acute abnormality. LIVER: Cirrhosis. Hepatic steatosis. GALLBLADDER AND BILE DUCTS: Gallbladder is unremarkable. No biliary ductal dilatation. SPLEEN: No acute abnormality. PANCREAS: No acute abnormality. ADRENAL GLANDS: No acute abnormality. KIDNEYS, URETERS AND BLADDER: No stones in the kidneys or ureters. No hydronephrosis. No perinephric or periureteral stranding. Urinary bladder is unremarkable. GI AND BOWEL: Stomach demonstrates no acute abnormality. Moderate rectal stool burden, suggesting mild fecal impaction. Early stercoral colitis is possible. The appendix is not discretely visualized. There is no bowel obstruction. PERITONEUM AND RETROPERITONEUM: No ascites. No free air. VASCULATURE: Aorta is normal in caliber. Atherosclerotic calcifications of the abdominal aorta and branch vessels. LYMPH NODES: No lymphadenopathy. REPRODUCTIVE ORGANS: Status post hysterectomy. BONES AND SOFT TISSUES: Mild degenerative changes of the lumbar spine. No acute osseous abnormality. No focal soft tissue abnormality. IMPRESSION: 1. Moderate rectal stool burden, suggesting mild fecal impaction. Early stercoral colitis is possible. 2. Cirrhosis and hepatic steatosis. Electronically signed by: Pinkie Pebbles MD 12/03/2023 09:55 PM EDT RP Workstation: [id number]   CT Head Wo Contrast Result Date: 12/03/2023 CLINICAL DATA:  Altered mental status. EXAM: CT HEAD WITHOUT CONTRAST TECHNIQUE: Contiguous axial  images were obtained from the base of the skull through the vertex without intravenous contrast. RADIATION DOSE REDUCTION: This exam was performed according to the departmental dose-optimization program which includes automated exposure control, adjustment of the mA and/or kV according to patient size and/or use of iterative reconstruction technique. COMPARISON:  May 06, 2023 FINDINGS: Brain: There is mild cerebral atrophy with widening of the extra-axial spaces and ventricular dilatation. There are areas of decreased attenuation within the white matter tracts of the supratentorial brain, consistent with microvascular disease changes. Vascular: No hyperdense vessel or unexpected calcification. Skull: Normal. Negative for fracture or focal lesion. Sinuses/Orbits: No acute finding. Other: None. IMPRESSION: No acute intracranial abnormality. Electronically Signed   By: Suzen Dials M.D.   On: 12/03/2023 15:52   DG Chest Portable 1 View Result Date: 12/03/2023 CLINICAL DATA:  fever EXAM: DG CHEST 1V PORT COMPARISON:  05/05/2023 FINDINGS: No focal airspace consolidation, pleural effusion, or pneumothorax. No cardiomegaly. Tortuous aorta with aortic atherosclerosis. No acute fracture or destructive lesions. Multilevel thoracic osteophytosis. Age-indeterminate fracture of the distal right clavicle. IMPRESSION: 1. No acute cardiopulmonary abnormality. 2. Age-indeterminate fracture of the distal right clavicle, likely chronic. Correlation with point tenderness recommended to assess for acuity. Electronically Signed   By: Rogelia Myers M.D.   On: 12/03/2023 15:24       Time spent: 50 min     Veona Bittman, DO [facility] 12/06/2023, 2:01 PM    Dictation software may have been used to generate the above note. Typos may occur and escape review in typed/dictated notes.  Please contact Dr Marsa directly for clarity if needed.  Staff may message me via secure chat in Epic  but this  may not receive an immediate response,  please page me for urgent matters!  If 7PM-7AM, please contact night coverage [email or web address]

## 2023-12-06 NOTE — Evaluation (Signed)
 Occupational Therapy Evaluation Patient Details Name: Andrea Bell MRN: [id number] DOB: [birth year] Today's Date: 12/06/2023   History of Present Illness   Pt is a 68 y.o. female being admitted with sepsis of unknown source, presenting with acute metabolic encephalopathy and SIRS/sepsis but no obvious infectious source found as well as EtOH component, Wernicke's  hepatic encephalopathy, hyperammonemia   Advancing vascular dementia. Pt had a breakthrough seizure 2 days prior. History from husband is that following the seizure she has been confused, weak, shaking and mumbling which is far from her baseline. 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 PMH of COPD, IDA, traumatic SDH 04/2023 with post-traumatic seizure disorder on multiple AEDs     Clinical Impressions Pt was seen for OT evaluation this date. PTA, pt resides in a one level home with 5 STE with her husband who is there 24/7. At baseline, husband reports pt is IND with ambulation without AD use both household and community distances and IND with ADLs and IADLs at home, e.g. cooking. He reports after last hospitalization/rehab stay in April she utilized a cane upon DC home and eventually was able to progress to using no AD again.  Pt presents with deficits in strength, balance, cognitive-communicative deficits poor safety awareness, affecting safe and optimal ADL completion. Pt currently requires MOD I/supv for bed mobility tasks. Min A for LB dressing to donn mesh underwear and socks at EOB via figure four. Pt does need increased time and cues and at times is not able to understand/communicate, but with increased time is able to follow commands appropriately. She demo STS and ADL mobility in room ~10 ft using RW with CGA. Attempted HHA x1 and pt reaching for bed rail needing Min/CGA and constant hands on to ensure safety.  May benefit from a RW on DC vs going back to cane use.  Will follow acutely to maximize her strength, activity tolerance and promote return to PLOF. Do anticipate the need for follow up OT services upon acute hospital DC.      If plan is discharge home, recommend the following:   A little help with walking and/or transfers;A little help with bathing/dressing/bathroom;Help with stairs or ramp for entrance;Assistance with cooking/housework     Functional Status Assessment   Patient has had a recent decline in their functional status and demonstrates the ability to make significant improvements in function in a reasonable and predictable amount of time.     Equipment Recommendations   Other (comment) (RW)     Recommendations for Other Services         Precautions/Restrictions   Precautions Precautions: Fall Recall of Precautions/Restrictions: Impaired Restrictions Weight Bearing Restrictions Per Provider Order: No     Mobility Bed Mobility Overal bed mobility: Needs Assistance Bed Mobility: Supine to Sit     Supine to sit: Modified independent (Device/Increase time), Supervision, HOB elevated, Used rails     General bed mobility comments: increased time, cues but no physical assist provided    Transfers Overall transfer level: Needs assistance Equipment used: Rolling walker (2 wheels) Transfers: Sit to/from Stand Sit to Stand: Contact guard assist           General transfer comment: stood from EOB x2 during session with CGA, increased time and cues for hand placement; able to ambulate ~10 ft using RW with CGA and RW use with cues for sequencing/turns and IV management  Balance Overall balance assessment: Needs assistance Sitting-balance support: No upper extremity supported, Feet supported Sitting balance-Leahy Scale: Good     Standing balance support: Reliant on assistive device for balance, Single extremity supported, Bilateral upper extremity  supported Standing balance-Leahy Scale: Fair Standing balance comment: attempted without AD and pt reaching for bed rail/therapist hand, did well with RW per husband utilized a cane a few months ago until she was able to get back to no AD use                           ADL either performed or assessed with clinical judgement   ADL Overall ADL's : Needs assistance/impaired                     Lower Body Dressing: Minimal assistance;Contact guard assist;Sitting/lateral leans;Sit to/from stand Lower Body Dressing Details (indicate cue type and reason): to donn mesh underwear and bil socks             Functional mobility during ADLs: Contact guard assist;Rolling walker (2 wheels);Cueing for safety       Vision         Perception         Praxis         Pertinent Vitals/Pain Pain Assessment Pain Assessment: No/denies pain     Extremity/Trunk Assessment Upper Extremity Assessment Upper Extremity Assessment: Overall WFL for tasks assessed   Lower Extremity Assessment Lower Extremity Assessment: Generalized weakness       Communication Communication Factors Affecting Communication: Difficulty expressing self   Cognition Arousal: Alert Behavior During Therapy: WFL for tasks assessed/performed Cognition: History of cognitive impairments             OT - Cognition Comments: unable to understanding all questions, at times completely able to understand and answers appropriately and other times unable to                 Following commands: Impaired Following commands impaired: Follows one step commands with increased time, Follows one step commands inconsistently     Cueing  General Comments   Cueing Techniques: Verbal cues;Tactile cues  IV bleeding-nurse notified   Exercises Other Exercises Other Exercises: Edu pt/spouse of role of OT in acute setting and DC recommendations.   Shoulder Instructions      Home Living  Family/patient expects to be discharged to:: Private residence Living Arrangements: Spouse/significant other Available Help at Discharge: Family;Available 24 hours/day   Home Access: Stairs to enter [employer] of Steps: 5 Entrance Stairs-Rails: Right;Can reach both;Left Home Layout: One level     Bathroom Shower/Tub: Tub/shower unit;Walk-in shower   Bathroom Toilet: Standard     Home Equipment: Cane - single point;BSC/3in1          Prior Functioning/Environment Prior Level of Function : Independent/Modified Independent             Mobility Comments: ambulates community and household distances without AD, utilized a cane after last hospitalization but was able to get back to no AD ADLs Comments: IND with ADL/IADLs, cooks, etc.    OT Problem List: Decreased strength;Decreased activity tolerance;Impaired balance (sitting and/or standing)   OT Treatment/Interventions: Self-care/ADL training;Therapeutic exercise;Therapeutic activities;DME and/or AE instruction;Patient/family education;Balance training      OT Goals(Current goals can be found in the care plan section)   Acute Rehab OT Goals Patient Stated Goal: get stronger to go home OT Goal Formulation: With patient/family Time For Goal  Achievement: 12/20/23 Potential to Achieve Goals: Good ADL Goals Pt Will Perform Grooming: with modified independence;standing;with supervision Pt Will Perform Lower Body Bathing: with modified independence;with supervision;sitting/lateral leans;sit to/from stand Pt Will Transfer to Toilet: with modified independence;with supervision;regular height toilet;ambulating   OT Frequency:  Min 2X/week    Co-evaluation              AM-PAC OT 6 Clicks Daily Activity     Outcome Measure Help from another person eating meals?: None Help from another person taking care of personal grooming?: A Little Help from another person toileting, which includes using toliet, bedpan,  or urinal?: A Little Help from another person bathing (including washing, rinsing, drying)?: A Little Help from another person to put on and taking off regular upper body clothing?: A Little Help from another person to put on and taking off regular lower body clothing?: A Little 6 Click Score: 19   End of Session Equipment Utilized During Treatment: Rolling walker (2 wheels) Nurse Communication: Mobility status  Activity Tolerance: Patient tolerated treatment well Patient left: in bed;with call bell/phone within reach;with family/visitor present;with nursing/sitter in room  OT Visit Diagnosis: Other abnormalities of gait and mobility (R26.89);Muscle weakness (generalized) (M62.81)                Time: 8358-8285 OT Time Calculation (min): 33 min Charges:  OT General Charges $OT Visit: 1 Visit OT Evaluation $OT Eval Moderate Complexity: 1 Mod OT Treatments $Self Care/Home Management : 8-22 mins Jesica Goheen, OTR/L 12/06/23, 5:29 PM  Stephen Turnbaugh E Kisha Messman 12/06/2023, 5:25 PM

## 2023-12-07 ENCOUNTER — Inpatient Hospital Stay

## 2023-12-07 DIAGNOSIS — A419 Sepsis, unspecified organism: Secondary | ICD-10-CM | POA: Diagnosis not present

## 2023-12-07 LAB — COMPREHENSIVE METABOLIC PANEL WITH GFR
ALT: 20 U/L (ref 0–44)
AST: 43 U/L — ABNORMAL HIGH (ref 15–41)
Albumin: 3.2 g/dL — ABNORMAL LOW (ref 3.5–5.0)
Alkaline Phosphatase: 133 U/L — ABNORMAL HIGH (ref 38–126)
Anion gap: 9 (ref 5–15)
BUN: 14 mg/dL (ref 8–23)
CO2: 21 mmol/L — ABNORMAL LOW (ref 22–32)
Calcium: 9.5 mg/dL (ref 8.9–10.3)
Chloride: 107 mmol/L (ref 98–111)
Creatinine, Ser: 0.76 mg/dL (ref 0.44–1.00)
GFR, Estimated: >60 mL/min (ref >60)
Glucose, Bld: 142 mg/dL — ABNORMAL HIGH (ref 70–99)
Potassium: 2.6 mmol/L — CL (ref 3.5–5.1)
Sodium: 137 mmol/L (ref 135–145)
Total Bilirubin: 0.9 mg/dL (ref 0.0–1.2)
Total Protein: 5.9 g/dL — ABNORMAL LOW (ref 6.5–8.1)

## 2023-12-07 LAB — CBC
HCT: 26.7 % — ABNORMAL LOW (ref 36.0–46.0)
Hemoglobin: 8.8 g/dL — ABNORMAL LOW (ref 12.0–15.0)
MCH: 31.9 pg (ref 26.0–34.0)
MCHC: 33 g/dL (ref 30.0–36.0)
MCV: 96.7 fL (ref 80.0–100.0)
Platelets: 101 K/uL — ABNORMAL LOW (ref 150–400)
RBC: 2.76 MIL/uL — ABNORMAL LOW (ref 3.87–5.11)
RDW: 16.2 % — ABNORMAL HIGH (ref 11.5–15.5)
WBC: 4.9 K/uL (ref 4.0–10.5)
nRBC: 0 % (ref 0.0–0.2)

## 2023-12-07 LAB — BASIC METABOLIC PANEL WITH GFR
Anion gap: 7 (ref 5–15)
BUN: 15 mg/dL (ref 8–23)
CO2: 21 mmol/L — ABNORMAL LOW (ref 22–32)
Calcium: 9.7 mg/dL (ref 8.9–10.3)
Chloride: 112 mmol/L — ABNORMAL HIGH (ref 98–111)
Creatinine, Ser: 0.5 mg/dL (ref 0.44–1.00)
GFR, Estimated: >60 mL/min (ref >60)
Glucose, Bld: 120 mg/dL — ABNORMAL HIGH (ref 70–99)
Potassium: 3.6 mmol/L (ref 3.5–5.1)
Sodium: 140 mmol/L (ref 135–145)

## 2023-12-07 LAB — AMMONIA: Ammonia: 57 umol/L — ABNORMAL HIGH (ref 9–35)

## 2023-12-07 LAB — CSF CULTURE W GRAM STAIN
Culture: NO GROWTH
Gram Stain: NONE SEEN

## 2023-12-07 LAB — MAGNESIUM: Magnesium: 1.5 mg/dL — ABNORMAL LOW (ref 1.7–2.4)

## 2023-12-07 MED ORDER — MAGNESIUM SULFATE 2 GM/50ML IV SOLN
2.0000 g | Freq: Once | INTRAVENOUS | Status: AC
  Administered 2023-12-07: 2 g via INTRAVENOUS
  Filled 2023-12-07: qty 50

## 2023-12-07 MED ORDER — IOHEXOL 350 MG/ML SOLN
75.0000 mL | Freq: Once | INTRAVENOUS | Status: AC | PRN
  Administered 2023-12-07: 75 mL via INTRAVENOUS

## 2023-12-07 MED ORDER — POTASSIUM CHLORIDE 20 MEQ PO PACK
40.0000 meq | PACK | Freq: Once | ORAL | Status: AC
  Administered 2023-12-07: 40 meq via ORAL
  Filled 2023-12-07: qty 2

## 2023-12-07 MED ORDER — MAGNESIUM CHLORIDE 64 MG PO TBEC
2.0000 | DELAYED_RELEASE_TABLET | Freq: Every day | ORAL | Status: DC
  Administered 2023-12-08 – 2023-12-09 (×2): 128 mg via ORAL
  Filled 2023-12-07 (×2): qty 2

## 2023-12-07 MED ORDER — POTASSIUM CHLORIDE 10 MEQ/100ML IV SOLN
10.0000 meq | INTRAVENOUS | Status: AC
  Administered 2023-12-07 (×4): 10 meq via INTRAVENOUS
  Filled 2023-12-07 (×3): qty 100

## 2023-12-07 NOTE — Progress Notes (Signed)
 Mobility Specialist - Progress Note     12/07/23 1600  Mobility  Activity Turned to back - supine;Ambulated with assistance;Stood at bedside;Pivoted/transferred to/from [facility]  Range of Motion/Exercises Active  Activity Response Tolerated well  Mobility Referral Yes  Mobility visit 1 Mobility  Mobility Specialist Start Time (ACUTE ONLY) 1600  Mobility Specialist Stop Time (ACUTE ONLY) 1622  Mobility Specialist Time Calculation (min) (ACUTE ONLY) 22 min   Pt attempting to get out of bed upon entry. Pt redirected and placed on BSC with RW MinA. Pt confused during session. Pt returned to bed and left with needs in reach and bed alarm activated.   Guido Rumble Mobility Specialist 12/07/23, 6:26 PM

## 2023-12-07 NOTE — Progress Notes (Signed)
 PT Cancellation Note  Patient Details Name: Andrea Bell MRN: [id number] DOB: February 06, 1956   Cancelled Treatment:    Reason Eval/Treat Not Completed: Patient not medically ready. PT orders received and pt chart reviewed. Per chart review, pt with K+ of 2.6 as of this morning. K+ of <2.8 contraindicated for participation with therapy services. Will continue efforts and follow up when pt is medically stable for participation.   [name] [name], PT, DPT 8:15 AM,12/07/23 Physical Therapist - [facility]

## 2023-12-07 NOTE — Discharge Instructions (Signed)

## 2023-12-07 NOTE — Progress Notes (Signed)
   12/06/23 2351  Assess: MEWS Score  Temp (!) 102.4 F (39.1 C)  BP (!) 129/57  MAP (mmHg) 78  Pulse Rate (!) 126  SpO2 94 %  O2 Device Room Air  Assess: MEWS Score  MEWS Temp 2  MEWS Systolic 0  MEWS Pulse 2  MEWS RR 0  MEWS LOC 0  MEWS Score 4  MEWS Score Color Red  Assess: if the MEWS score is Yellow or Red  Were vital signs accurate and taken at a resting state? Yes  Does the patient meet 2 or more of the SIRS criteria? Yes  Does the patient have a confirmed or suspected source of infection? Yes  MEWS guidelines implemented  Yes, red  Treat  MEWS Interventions Considered administering scheduled or prn medications/treatments as ordered  Take Vital Signs  Increase Vital Sign Frequency  Red: Q1hr x2, continue Q4hrs until patient remains green for 12hrs  Escalate  MEWS: Escalate Red: Discuss with charge nurse and notify provider. Consider notifying RRT. If remains red for 2 hours consider need for higher level of care  Notify: Charge Nurse/RN  Name of Charge Nurse/RN Notified Ronal ORN., RN  Assess: SIRS CRITERIA  SIRS Temperature  1  SIRS Respirations  0  SIRS Pulse 1  SIRS WBC 0  SIRS Score Sum  2   Patient assessed for noted elevated pulse and temperature. Patient administered prn medications to assist with symptom management.  To continue to be monitored, treated  to assist with symptom relief.

## 2023-12-07 NOTE — Evaluation (Signed)
 Physical Therapy Evaluation Patient Details Name: Andrea Bell MRN: [id number] DOB: [birth year] Today's Date: 12/07/2023  History of Present Illness  Pt is a 68 y.o. female being admitted with sepsis of unknown source, presenting with acute metabolic encephalopathy and SIRS/sepsis but no obvious infectious source found as well as EtOH component, Wernicke's  hepatic encephalopathy, hyperammonemia   Advancing vascular dementia. Pt had a breakthrough seizure 2 days prior. History from husband is that following the seizure she has been confused, weak, shaking and mumbling which is far from her baseline. 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 PMH of COPD, IDA, traumatic SDH 04/2023 with post-traumatic seizure disorder on multiple AEDs   Clinical Impression  Pt received in supine, asleep, easily awakens and is agreeable for PT eval. At baseline, pt is IND for ADL's, IADL's, and ambulation without AD.   Pt presents with baseline impaired cognition, decreased standing balance, impaired skin integrity, functional weakness, and decreased activity tolerance, resulting in impaired functional mobility from baseline. Due to deficits, pt required mod I for bed mobility, CGA-minA for transfers, and CGA-minA to ambulate 59ft x2 with RW. Episode of imbalance with turns requiring increased assist to prevent fall. Increased time/effort during evaluation for assistance with bathing and linen change due to incontinent episode (see general comments section for details).  Deficits limit the pt's ability to safely and independently perform ADL's, transfer, and ambulate. Pt will benefit from acute skilled PT services to address deficits for return to baseline function. Pt will benefit from post acute therapy services to address deficits for return to baseline function.  Encourage OOB mobility with nursing  and mobility tech for toileting for continued progress towards goals and maintenance of IND with functional mobility while hospitalized.        If plan is discharge home, recommend the following: A little help with walking and/or transfers;A little help with bathing/dressing/bathroom;Assistance with cooking/housework;Help with stairs or ramp for entrance;Assist for transportation;Supervision due to cognitive status     Equipment Recommendations Rolling walker (2 wheels)     Functional Status Assessment Patient has had a recent decline in their functional status and demonstrates the ability to make significant improvements in function in a reasonable and predictable amount of time.     Precautions / Restrictions Precautions Precautions: Fall Recall of Precautions/Restrictions: Impaired Restrictions Weight Bearing Restrictions Per Provider Order: No      Mobility  Bed Mobility     Rolling: Modified independent (Device/Increase time)   Supine to sit: Modified independent (Device/Increase time) Sit to supine: Min assist   General bed mobility comments: mod I to roll onto back and to the L with UE support on handrail. mod I to sit EOB, HOB flat, use of BUE for support. minA for BLE facilitation onto bed, HOB flat. increased time/effort.    Transfers   Equipment used: Rolling walker (2 wheels) Transfers: Sit to/from Stand Sit to Stand: Min assist, Contact guard assist           General transfer comment: minA for power to stand from EOB with bil HHA and CGA for safety to stand from [facility] with RW. Verbal cues for safety, sequencing, hand placement, and RW proximity.    Ambulation/Gait Ambulation/Gait assistance: Contact guard assist, Min assist Gait Distance (Feet): 20 Feet (28ft x2)           General Gait Details: overall CGA for safety to ambulate to/from [facility]  with RW. demo's slowed cadence, decreased step length/foot clearance bilaterally, decreased RW proximity.  multimodal cues for safety, sequencing, staying inside RW, and RW proximity. x1 LOB with turning to sit at [facility], requiring assistance for correction.     Balance Overall balance assessment: Needs assistance Sitting-balance support: No upper extremity supported, Feet supported Sitting balance-Leahy Scale: Good     Standing balance support: Reliant on assistive device for balance, Single extremity supported, Bilateral upper extremity supported Standing balance-Leahy Scale: Fair Standing balance comment: standing balance with RW                             Pertinent Vitals/Pain Pain Assessment Pain Assessment: No/denies pain    Home Living Family/patient expects to be discharged to:: Private residence Living Arrangements: Spouse/significant other Available Help at Discharge: Family;Available 24 hours/day   Home Access: Stairs to enter Entrance Stairs-Rails: Right;Can reach both;Left Entrance Stairs-Number of Steps: 5   Home Layout: One level Home Equipment: Cane - single point;BSC/3in1      Prior Function Prior Level of Function : Independent/Modified Independent             Mobility Comments: ambulates community and household distances without AD, utilized a cane after last hospitalization but was able to get back to no AD ADLs Comments: IND with ADL/IADLs, cooks, etc.     Extremity/Trunk Assessment   Upper Extremity Assessment Upper Extremity Assessment: Overall WFL for tasks assessed    Lower Extremity Assessment Lower Extremity Assessment: Generalized weakness       Communication   Communication Factors Affecting Communication: Difficulty expressing self    Cognition Arousal: Alert Behavior During Therapy: WFL for tasks assessed/performed   PT - Cognitive impairments: History of cognitive impairments                         Following commands: Impaired Following commands impaired: Follows one step commands with increased time,  Follows one step commands inconsistently     Cueing Cueing Techniques: Verbal cues, Tactile cues     General Comments General comments (skin integrity, edema, etc.): pt able to assist with UB bathing after incontinent episode; total assist for LB bathing. achieves figure 4 position to don sock on the R but has difficulty with fine motor control required to don sock. Healing scabs to L knee.    Exercises Other Exercises Other Exercises: Pt educated re: PT Role/POC, DC recommendations, safety with functional mobility using RW, OOB for toileting, call for help, sit with HOB elevated for PO intake   Assessment/Plan    PT Assessment Patient needs continued PT services  PT Problem List Decreased strength;Decreased activity tolerance;Decreased balance;Decreased mobility;Decreased cognition;Decreased safety awareness       PT Treatment Interventions DME instruction;Gait training;Stair training;Functional mobility training;Therapeutic activities;Therapeutic exercise;Balance training;Neuromuscular re-education;Patient/family education    PT Goals (Current goals can be found in the Care Plan section)  Acute Rehab PT Goals Patient Stated Goal: go home PT Goal Formulation: With patient Time For Goal Achievement: 12/21/23 Potential to Achieve Goals: Good    Frequency Min 2X/week        AM-PAC PT 6 Clicks Mobility  Outcome Measure Help needed turning from your back to your side while in a flat bed without using bedrails?: None Help needed moving from lying on your back to sitting on the side of a flat bed without using bedrails?: None Help needed moving to and from a bed  to a chair (including a wheelchair)?: A Little Help needed standing up from a chair using your arms (e.g., wheelchair or bedside chair)?: A Little Help needed to walk in hospital room?: A Little Help needed climbing 3-5 steps with a railing? : A Little 6 Click Score: 20    End of Session Equipment Utilized During  Treatment: Gait belt Activity Tolerance: Patient tolerated treatment well Patient left: in bed;with bed alarm set;with call bell/phone within reach (all 4 bedrails up and padding donned per seizure protocol) Nurse Communication: Mobility status PT Visit Diagnosis: Unsteadiness on feet (R26.81);Muscle weakness (generalized) (M62.81);Difficulty in walking, not elsewhere classified (R26.2)    Time: 8871-8798 PT Time Calculation (min) (ACUTE ONLY): 33 min   Charges:   PT Evaluation $PT Eval Moderate Complexity: 1 Mod PT Treatments $Therapeutic Activity: 8-22 mins PT General Charges $$ ACUTE PT VISIT: 1 Visit        [name] [name], PT, DPT 12:45 PM,12/07/23 Physical Therapist - [facility]

## 2023-12-07 NOTE — TOC Progression Note (Addendum)
 Transition of Care [facility]) - Progression Note    Patient Details  Name: Andrea Bell MRN: [id number] Date of Birth: [birth year]  Transition of [facility] CM/SW Contact  Marinda Cooks, RN Phone Number: 12/07/2023, 4:07 PM  Clinical Narrative:    Per chart review by this CM pt is not medically cleared to dc today. This CN placed substance abuse resources on AVS per request of medical team.TOC will cont to follow dc planning / care coordination and update as applicable.                      Expected Discharge Plan and Services                                               Social Drivers of Health (SDOH) Interventions SDOH Screenings   Food Insecurity: Patient Unable To Answer (12/05/2023)  Housing: Patient Unable To Answer (12/05/2023)  Transportation Needs: Patient Unable To Answer (12/05/2023)  Utilities: Patient Unable To Answer (12/05/2023)  Alcohol Screen: Low Risk  (10/28/2019)  Depression (PHQ2-9): Low Risk  (08/14/2023)  Financial Resource Strain: High Risk (05/16/2023)   Received from [facility]  Physical Activity: Inactive (05/16/2023)   Received from [facility]  Social Connections: Patient Unable To Answer (12/05/2023)  Stress: No Stress Concern Present (05/16/2023)   Received from [facility]  Tobacco Use: Medium Risk (12/04/2023)  Health Literacy: High Risk (06/01/2023)   Received from [facility]    Readmission Risk Interventions     No data to display

## 2023-12-07 NOTE — Progress Notes (Addendum)
 PROGRESS NOTE    Andrea Bell   FMW:[id number] DOB: 1955/11/11  DOA: 12/03/2023 Date of Service: 12/07/23 which is hospital day 4  PCP: Lora Odor, [facility] course / significant events:   HPI:  Andrea Bell is a 68 y.o. 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 History from husband is that following the seizure she has been confused, weak, shaking and mumbling which is far from her baseline   10/14: to ED. Fever 102.7, HR 108, lumbar puncture performed, CT head non-acute, started on sepsis fluids and broad spectrum abx, admitted for SIRS/Sepsis FUO. CT abd/pelv (+)hepatic steatosis/cirrhosis and ammonia slightly up, large stool burden likely impacted, procal up,  10/15: still npo pending swallow eval, if able to take lactulose will administer this otherwise will need enema / disimpaction. EEG pending. Remains encephalopathic but responsive, follows commands. Medic in ED reported dark stool but no obvious melena, pt is on iron  supplements, hemoccult ordered prn. Spoke w/ husband, she is a heavy drinker about 2 times per week, unsure last drink, question EtOH withdrawal to explain symptoms including seizure and fever, on CIWA. 10/16: ammonia slight increase. MRI brain no mass effect or new bleed, question old hemosiderin staining on L frontal prior subarachnoid hemorrhage may be seizure focus. SLP eval - significant cognitive deficits appear stable/similar compared to previous SLP eval 05/2023, minor aspiration risk mostly d/t cognition. Neurology consult - dc zongran, cont keppra , give thiamine  500 mg iv tid x 3d.  10/17: remains same/stable, husband reports she seems a lot better today. dc abx and monitor thru the weekend off abx. PT/OT to see, expect family  will decide for home w/ home health depending on how pt does  10/18: hold abx, eval PE given persistent fever/tachycardia w/ no other infection found, if neg PE will ask ID to weigh in. Much more cogent and talkative today and no complaints      Consultants:  none  Procedures/Surgeries: Lumbar puncture in ED      ASSESSMENT & PLAN:   Acute metabolic encephalopathy, multifactorial Awake but severely abulic patient with minimal speech output who is not able to cooperate with most components of the exam. No gross focal weakness appreciated.  Ddx:  sepsis but no obvious infectious source found Seizure but doubt prolonged postictal EtOH component / withdrawals / Wernicke's hepatic encephalopathy / hyperammonemia  Advancing vascular dementia. Appreciate neurology consult  Neurologic checks  fall and aspiration precautions See below treating individual potential cause(s)  SIRS/sepsis but no obvious infectious source found Sepsis, unknown source Question SIRS without infection   Fever, intermittent (most recent 102.4 10/17 23:51) Elevation procalcitonin (1.65 10/14 --> 1.3 10/18) Sepsis criteria include fever, tachycardia albeit with normal WBC and lactic acid. No obvious infectious source, have reliably r/o: UTI, meningitis, viral resp illness, intraabdominal abscess, pneumonia Question stercoral colitis on CT but pt has not c/o abd pain Of note, cirrhosis can also increase procalcitonin In absence of clinical signs/symptoms of infection, but (+)Ddimer, persistent elevation procalcitonin and fever, will get CTA chest to eval for PE but if this is negative will d/w infectious disease --> no PE  Hold abx for now pending discussion w/ ID Pt is clinically improved today / more verbal, and has no complaints that would point to infection  Message sent to ID on remote call  over the weekend, acknowledged, they advised ask ID to see on Monday no other recs/orders at this time  Seizure hx  in setting of previous SDH/SAH and TBI, but doubt prolonged postictal Per neurology Regarding the possibility of subclinical seizures, she is prescribed Keppra  1500 mg BID and Zonegran 100 mg BID, but she has uncertain medication adherence. Husband stated that she drinks heavily a couple of times per week and therefore EtOH withdrawal may be playing a role (hypoactive delirium). 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 Holding home Zonegran given hyperammoniemia, consider restart if/when ammonia levels normalize IF can confirm continued EtOH abstinence Keppra  1500 mg bid   EtOH use question withdrawal / Wernicke's Spoke w/ husband, she is a heavy drinker about 2 times per week, unsure last drink, question EtOH withdrawal to explain symptoms including seizure and fever  Per neurology: Wernicke's encephalopathy is also possible. Due to her inability to participate fully with the exam, cannot determine if she has a subtle deficit of ocular motility or ataxia. CIWA Thiamine  500 mg IV tid x3 days then ok to return to 100 mg daily  Hepatic encephalopathy Cirrhosis and hepatic steatosis Mild ammonia increase - worsening Holding home Zonegran given hyperammoniemia, consider restart if/when ammonia levels normalize IF can confirm continued EtOH abstinence Lactulose  Monitor ammonia  Of note, cirrhosis can also increase procalcitonin  Advancing vascular dementia. family history of dementia (mother). Therefore a possible underlying degenerative dementia could also be contributing to her symptoms and/or susceptibility to relatively minor insults causing AMS.  Supportive care    Cardiomegaly, with trace pericardial effusion  Has not complained of chest pain,  pericarditis a consideration w/ unexplained fever but is not likely Echocardiogram  Thrombocytopenia Platelets 86,000 10/14 down from baseline of 138,000 --> 64000 10/15 --> 84000  Likely secondary to severe sepsis complicated by cirrhosis,  In the absence of leukocytosis and lactic acid we will explore other potential etiologies, rule out DIC Hematologic workup --> normal smear, elevated LDH, elevated D-dimer, normal fibrinogen   Trend CBC  Hypokalemia Replace as needed Monitor BMP  Moderate rectal stool burden, suggesting mild fecal impaction. Early stercoral colitis is possible. - clinically significant imaging findings Chronic constipation   Medic in ED reported dark stool but no obvious melena, pt is on iron  supplements, hemoccult ordered prn.  Chronic constipation per husband Abx as above would cover colitis  Lactulose po if possible  Pt did have bowel movement yesterday, monitor I&O   Iron  deficiency anemia, unspecified H&H stable Continue to monitor   Essential hypertension, benign Holding antihypertensives in the setting of sepsis Hydralazine IV as needed   COPD (chronic obstructive pulmonary disease)  Not acutely exacerbated DuoNebs as needed  Left upper lobe lung nodule 7 x 9 mm  slight increased size since the 2023 exam.  Low-grade adenocarcinoma cannot be excluded.  Continued follow-up is recommended.  Elevated D-Dimer, Wells 0 Low suspicion for PE in absence of major risk factors, tachycardia, chest pain, hypoxia, resp distress Done for DIC w/u as above PE imaging --> neg    No concerns based on BMI: Body mass index is 22.93 kg/m.SABRA Significantly low or high BMI is associated with higher medical risk.  Underweight - under 18  overweight - 25 to 29 obese - 30 or  more Class 1 obesity: BMI of 30.0 to 34 Class 2 obesity: BMI of 35.0 to 39 Class 3 obesity: BMI of 40.0 to 49 Super Morbid Obesity: BMI 50-59 Super-super Morbid Obesity: BMI 60+ Healthy  nutrition and physical activity advised as adjunct to other disease management and risk reduction treatments    DVT prophylaxis: hold Rx d/t low Plt  IV fluids: LR 100 cc/h continuous IV fluids  Nutrition: pending swallow eval  Central lines / other devices: none  Code Status: FULL CODE ACP documentation reviewed: none on file in [facility]  TOC needs: TBD Medical barriers to dispo: w/u SIRS/sepsis Expected medical readiness for discharge pend clinical course, at least several days.              Subjective / Brief ROS:  Patient reports no complaints - she is awake/alert and more talkative today, though does not remember who I am. She reports no complaints other than somewhat sore in back which is chronic for her. Denies abd pain, headache, dysuria, cough   Family Communication: spoke w/ husband later today - he reports around 10 pm there was an episode of pt shaking and mumbling but seemed alert - he called in nursing and reports that they gave her something but said this wasn't seizure there are no progress notes in chart about this episode     Objective Findings:  Vitals:   12/07/23 0415 12/07/23 0835 12/07/23 1150 12/07/23 1524  BP: 113/65 (!) 122/55 126/69 (!) 109/59  Pulse: 90 76 94 84  Resp:  18 20 14   Temp: 98.2 F (36.8 C) 98.1 F (36.7 C) 97.8 F (36.6 C) 98.6 F (37 C)  TempSrc: Oral     SpO2: 98% 98%  98%  Weight:      Height:        Intake/Output Summary (Last 24 hours) at 12/07/2023 1637 Last data filed at 12/07/2023 0900 Gross per 24 hour  Intake 710.61 ml  Output --  Net 710.61 ml   Filed Weights   12/03/23 1432  Weight: 66.4 kg    Examination:  Physical Exam Constitutional:      General: She is not in acute distress.    Appearance: She is ill-appearing.  Cardiovascular:     Rate and Rhythm: Normal rate and regular rhythm.  Pulmonary:     Effort: Pulmonary effort is normal.     Breath sounds: Normal breath sounds.  Abdominal:      General: Bowel sounds are normal. There is distension.     Palpations: Abdomen is soft.     Tenderness: There is no guarding or rebound.  Musculoskeletal:     Right lower leg: No edema.     Left lower leg: No edema.  Skin:    General: Skin is warm and dry.  Neurological:     Mental Status: She is alert.     Cranial Nerves: No cranial nerve deficit.     Coordination: Coordination normal.  Psychiatric:        Behavior: Behavior normal.          Scheduled Medications:   budesonide -glycopyrrolate-formoterol   2 puff Inhalation BID   lactulose  20 g Oral Daily   levETIRAcetam   1,500 mg Oral BID    Continuous Infusions:  thiamine  (VITAMIN B1) injection 500 mg (12/07/23 1347)    PRN Medications:  acetaminophen  **OR** acetaminophen , albuterol , LORazepam , LORazepam  **OR** LORazepam , ondansetron  **OR** ondansetron  (ZOFRAN ) IV  Antimicrobials from admission:  Anti-infectives (From admission, onward)  Start     Dose/Rate Route Frequency Ordered Stop   12/05/23 0800  vancomycin (VANCOREADY) IVPB 1500 mg/300 mL  Status:  Discontinued        1,500 mg 150 mL/hr over 120 Minutes Intravenous Every 24 hours 12/04/23 1015 12/04/23 1017   12/04/23 1800  cefTRIAXone (ROCEPHIN) 2 g in sodium chloride  0.9 % 100 mL IVPB  Status:  Discontinued        2 g 200 mL/hr over 30 Minutes Intravenous Daily-1800 12/04/23 1018 12/07/23 0757   12/04/23 0800  vancomycin (VANCOREADY) IVPB 750 mg/150 mL  Status:  Discontinued        750 mg 150 mL/hr over 60 Minutes Intravenous Every 12 hours 12/03/23 2339 12/04/23 1015   12/04/23 0600  metroNIDAZOLE (FLAGYL) IVPB 500 mg  Status:  Discontinued        500 mg 100 mL/hr over 60 Minutes Intravenous Every 12 hours 12/03/23 2155 12/04/23 1017   12/04/23 0000  ceFEPIme (MAXIPIME) 2 g in sodium chloride  0.9 % 100 mL IVPB  Status:  Discontinued        2 g 200 mL/hr over 30 Minutes Intravenous Every 8 hours 12/03/23 2226 12/04/23 1017   12/03/23 2230  vancomycin  (VANCOREADY) IVPB 500 mg/100 mL        500 mg 100 mL/hr over 60 Minutes Intravenous  Once 12/03/23 2224 12/04/23 0348   12/03/23 2200  vancomycin (VANCOCIN) IVPB 1000 mg/200 mL premix  Status:  Discontinued        1,000 mg 200 mL/hr over 60 Minutes Intravenous  Once 12/03/23 2155 12/03/23 2224   12/03/23 1530  ceFEPIme (MAXIPIME) 2 g in sodium chloride  0.9 % 100 mL IVPB        2 g 200 mL/hr over 30 Minutes Intravenous  Once 12/03/23 1525 12/03/23 1636   12/03/23 1530  metroNIDAZOLE (FLAGYL) IVPB 500 mg        500 mg 100 mL/hr over 60 Minutes Intravenous  Once 12/03/23 1525 12/03/23 1822   12/03/23 1530  vancomycin (VANCOCIN) IVPB 1000 mg/200 mL premix        1,000 mg 200 mL/hr over 60 Minutes Intravenous  Once 12/03/23 1525 12/03/23 2001           Data Reviewed:  I have personally reviewed the following...  CBC: Recent Labs  Lab 12/03/23 1439 12/03/23 2009 12/04/23 0520 12/05/23 0439 12/07/23 0305  WBC 7.1 7.1 6.5 7.8 4.9  NEUTROABS 5.9 5.4  --   --   --   HGB 11.0* 10.3* 10.4* 10.2* 8.8*  HCT 34.1* 31.5* 32.1* 30.2* 26.7*  MCV 96.9 96.9 97.0 96.2 96.7  PLT 86* 72* 64* 83* 101*   Basic Metabolic Panel: Recent Labs  Lab 12/03/23 1439 12/04/23 0555 12/04/23 1633 12/05/23 0439 12/07/23 0305 12/07/23 1342  NA 136 137  --  138 137 140  K 3.4* 2.9*  --  2.9* 2.6* 3.6  CL 99 104  --  107 107 112*  CO2 22 22  --  19* 21* 21*  GLUCOSE 137* 105*  --  91 142* 120*  BUN 23 21  --  19 14 15   CREATININE 0.74 0.61  --  0.73 0.76 0.50  CALCIUM  10.3 9.6  --  9.6 9.5 9.7  MG  --   --  1.2* 1.8 1.5*  --    GFR: Estimated Creatinine Clearance: 66.4 mL/min (by C-G formula based on SCr of 0.5 mg/dL). Liver Function Tests: Recent Labs  Lab 12/03/23 1439  12/04/23 0555 12/05/23 0439 12/07/23 0305  AST 101* 107* 65* 43*  ALT 25 29 25 20   ALKPHOS 155* 113 117 133*  BILITOT 2.7* 2.1* 1.9* 0.9  PROT 8.3* 6.7 6.4* 5.9*  ALBUMIN 4.5 3.2* 3.3* 3.2*   No results for  input(s): LIPASE, AMYLASE in the last 168 hours. Recent Labs  Lab 12/03/23 2125 12/04/23 1633 12/05/23 0439 12/07/23 0305  AMMONIA 46* 47* 56* 57*   Coagulation Profile: Recent Labs  Lab 12/03/23 1439 12/04/23 0520  INR 1.3* 1.5*   Cardiac Enzymes: No results for input(s): CKTOTAL, CKMB, CKMBINDEX, TROPONINI in the last 168 hours. BNP (last 3 results) No results for input(s): PROBNP in the last 8760 hours. HbA1C: No results for input(s): HGBA1C in the last 72 hours. CBG: No results for input(s): GLUCAP in the last 168 hours. Lipid Profile: No results for input(s): CHOL, HDL, LDLCALC, TRIG, CHOLHDL, LDLDIRECT in the last 72 hours. Thyroid  Function Tests: No results for input(s): TSH, T4TOTAL, FREET4, T3FREE, THYROIDAB in the last 72 hours. Anemia Panel: No results for input(s): VITAMINB12, FOLATE, FERRITIN, TIBC, IRON , RETICCTPCT in the last 72 hours. Most Recent Urinalysis On File:     Component Value Date/Time   COLORURINE YELLOW (A) 12/03/2023 1436   APPEARANCEUR CLEAR (A) 12/03/2023 1436   APPEARANCEUR Clear 02/05/2013 1159   LABSPEC 1.016 12/03/2023 1436   LABSPEC 1.024 02/05/2013 1159   PHURINE 8.0 12/03/2023 1436   GLUCOSEU NEGATIVE 12/03/2023 1436   GLUCOSEU Negative 02/05/2013 1159   HGBUR SMALL (A) 12/03/2023 1436   BILIRUBINUR NEGATIVE 12/03/2023 1436   BILIRUBINUR Negative 02/05/2013 1159   KETONESUR 20 (A) 12/03/2023 1436   PROTEINUR 30 (A) 12/03/2023 1436   NITRITE NEGATIVE 12/03/2023 1436   LEUKOCYTESUR NEGATIVE 12/03/2023 1436   LEUKOCYTESUR Negative 02/05/2013 1159   Sepsis Labs: @LABRCNTIP (procalcitonin:4,lacticidven:4) Microbiology: Recent Results (from the past 240 hours)  Blood culture (routine x 2)     Status: None (Preliminary result)   Collection Time: 12/03/23  2:39 PM   Specimen: BLOOD  Result Value Ref Range Status   Specimen Description BLOOD RIGHT ANTECUBITAL  Final   Special  Requests   Final    BOTTLES DRAWN AEROBIC AND ANAEROBIC Blood Culture results may not be optimal due to an inadequate volume of blood received in culture bottles   Culture   Final    NO GROWTH 4 DAYS Performed at [facility], [address], [place], [place] [address]    Report Status PENDING  Incomplete  Resp panel by RT-PCR (RSV, Flu A&B, Covid) Anterior Nasal Swab     Status: None   Collection Time: 12/03/23  2:39 PM   Specimen: Anterior Nasal Swab  Result Value Ref Range Status   SARS Coronavirus 2 by RT PCR NEGATIVE NEGATIVE Final    Comment: (NOTE) SARS-CoV-2 target nucleic acids are NOT DETECTED.  The SARS-CoV-2 RNA is generally detectable in upper respiratory specimens during the acute phase of infection. The lowest concentration of SARS-CoV-2 viral copies this assay can detect is 138 copies/mL. A negative result does not preclude SARS-Cov-2 infection and should not be used as the sole basis for treatment or other patient management decisions. A negative result may occur with  improper specimen collection/handling, submission of specimen other than nasopharyngeal swab, presence of viral mutation(s) within the areas targeted by this assay, and inadequate number of viral copies(<138 copies/mL). A negative result must be combined with clinical observations, patient history, and epidemiological information. The expected result is Negative.  Fact  Sheet for Patients:  BloggerCourse.com  Fact Sheet for Healthcare Providers:  SeriousBroker.it  This test is no t yet approved or cleared by the [place]  FDA and  has been authorized for detection and/or diagnosis of SARS-CoV-2 by FDA under an Emergency Use Authorization (EUA). This EUA will remain  in effect (meaning this test can be used) for the duration of the COVID-19 declaration under Section 564(b)(1) of the Act, 21 U.S.C.section 360bbb-3(b)(1), unless the  authorization is terminated  or revoked sooner.       Influenza A by PCR NEGATIVE NEGATIVE Final   Influenza B by PCR NEGATIVE NEGATIVE Final    Comment: (NOTE) The Xpert Xpress SARS-CoV-2/FLU/RSV plus assay is intended as an aid in the diagnosis of influenza from Nasopharyngeal swab specimens and should not be used as a sole basis for treatment. Nasal washings and aspirates are unacceptable for Xpert Xpress SARS-CoV-2/FLU/RSV testing.  Fact Sheet for Patients: BloggerCourse.com  Fact Sheet for Healthcare Providers: SeriousBroker.it  This test is not yet approved or cleared by the [place]  FDA and has been authorized for detection and/or diagnosis of SARS-CoV-2 by FDA under an Emergency Use Authorization (EUA). This EUA will remain in effect (meaning this test can be used) for the duration of the COVID-19 declaration under Section 564(b)(1) of the Act, 21 U.S.C. section 360bbb-3(b)(1), unless the authorization is terminated or revoked.     Resp Syncytial Virus by PCR NEGATIVE NEGATIVE Final    Comment: (NOTE) Fact Sheet for Patients: BloggerCourse.com  Fact Sheet for Healthcare Providers: SeriousBroker.it  This test is not yet approved or cleared by the [place]  FDA and has been authorized for detection and/or diagnosis of SARS-CoV-2 by FDA under an Emergency Use Authorization (EUA). This EUA will remain in effect (meaning this test can be used) for the duration of the COVID-19 declaration under Section 564(b)(1) of the Act, 21 U.S.C. section 360bbb-3(b)(1), unless the authorization is terminated or revoked.  Performed at [facility], [address] [place], [place] [address]   Blood culture (routine x 2)     Status: None (Preliminary result)   Collection Time: 12/03/23  3:05 PM   Specimen: BLOOD  Result Value Ref Range Status   Specimen  Description BLOOD BLOOD RIGHT HAND  Final   Special Requests   Final    BOTTLES DRAWN AEROBIC AND ANAEROBIC Blood Culture results may not be optimal due to an inadequate volume of blood received in culture bottles   Culture   Final    NO GROWTH 4 DAYS Performed at [facility], [address] [place], [place] [address]    Report Status PENDING  Incomplete  CSF culture w Gram Stain     Status: None   Collection Time: 12/03/23  6:56 PM   Specimen: CSF; Cerebrospinal Fluid  Result Value Ref Range Status   Specimen Description   Final    CSF Performed at [facility], [address], [place], [place] [address]    Special Requests   Final    NONE Performed at [facility], [address] [place], [place] [address]    Gram Stain   Final    NO ORGANISMS SEEN WBC SEEN RED BLOOD CELLS PRESENT GRAM STAIN REVIEWED-AGREE WITH RESULT [id number] AT 0029, ADC    Culture   Final    NO GROWTH 3 DAYS Performed at [facility], [address] [place], [place] [address]    Report Status 12/07/2023 FINAL  Final  Respiratory (~20 pathogens) panel by PCR     Status: None   Collection Time: 12/04/23 12:18 AM   Specimen: Nasopharyngeal Swab; Respiratory  Result Value Ref Range Status   Adenovirus NOT DETECTED NOT DETECTED Final   Coronavirus 229E NOT DETECTED NOT DETECTED Final    Comment: (NOTE) The Coronavirus on the Respiratory Panel, DOES NOT test for the novel  Coronavirus (2019 nCoV)    Coronavirus HKU1 NOT DETECTED NOT DETECTED Final   Coronavirus NL63 NOT DETECTED NOT DETECTED Final   Coronavirus OC43 NOT DETECTED NOT DETECTED Final   Metapneumovirus NOT DETECTED NOT DETECTED Final   Rhinovirus / Enterovirus NOT DETECTED NOT DETECTED Final   Influenza A NOT DETECTED NOT DETECTED Final   Influenza B NOT DETECTED NOT DETECTED Final   Parainfluenza Virus 1 NOT DETECTED NOT DETECTED Final   Parainfluenza Virus 2 NOT DETECTED NOT DETECTED Final    Parainfluenza Virus 3 NOT DETECTED NOT DETECTED Final   Parainfluenza Virus 4 NOT DETECTED NOT DETECTED Final   Respiratory Syncytial Virus NOT DETECTED NOT DETECTED Final   Bordetella pertussis NOT DETECTED NOT DETECTED Final   Bordetella Parapertussis NOT DETECTED NOT DETECTED Final   Chlamydophila pneumoniae NOT DETECTED NOT DETECTED Final   Mycoplasma pneumoniae NOT DETECTED NOT DETECTED Final    Comment: Performed at [facility], [address], [place], [place] [address]      Radiology Studies last 3 days: CT Angio Chest Pulmonary Embolism (PE) W or WO Contrast Result Date: 12/07/2023 CLINICAL DATA:  persistent fever, tachycardia, elevated d dimer, no other infectious source found EXAM: CT ANGIOGRAPHY CHEST WITH CONTRAST TECHNIQUE: Multidetector CT imaging of the chest was performed using the standard protocol during bolus administration of intravenous contrast. Multiplanar CT image reconstructions and MIPs were obtained to evaluate the vascular anatomy. RADIATION DOSE REDUCTION: This exam was performed according to the departmental dose-optimization program which includes automated exposure control, adjustment of the mA and/or kV according to patient size and/or use of iterative reconstruction technique. CONTRAST:  75mL OMNIPAQUE IOHEXOL 350 MG/ML SOLN COMPARISON:  12/03/2023, 11/19/2023 FINDINGS: Cardiovascular: This is a technically adequate evaluation of the pulmonary vasculature. No filling defects or pulmonary emboli. The heart is enlarged, with prominent right atrial dilatation. Trace pericardial effusion. No evidence of thoracic aortic aneurysm or dissection. Atherosclerosis of the aorta. Mediastinum/Nodes: No enlarged mediastinal, hilar, or axillary lymph nodes. Thyroid  gland, trachea, and esophagus demonstrate no significant findings. Lungs/Pleura: Stable upper lobe predominant emphysema. There is a 7 x 9 mm ground-glass left upper lobe pulmonary nodule, reference image 21/5.  While this has not changed significantly since the recent 2025 CT, this has increased in size since the 2023 exam where I measure the this as 6 x 4 mm. Low-grade adenocarcinoma cannot be excluded. No acute airspace disease, effusion, or pneumothorax. The central airways are patent. Upper Abdomen: Continued hepatomegaly. Stable gastric fundal diverticulum. Musculoskeletal: No acute or destructive bony abnormalities. Reconstructed images demonstrate no additional findings. Review of the MIP images confirms the above findings. IMPRESSION: 1. No evidence of pulmonary embolus. 2. Cardiomegaly, with trace pericardial effusion. 3. 7 x 9 mm left upper lobe ground-glass pulmonary nodule, demonstrating slight increased size since the 2023 exam. Low-grade adenocarcinoma cannot be excluded. Continued follow-up is recommended. 4. Pulmonary emphysema is present. Pulmonary emphysema is an independent risk factor for lung cancer. Recommend patient be considered for lung cancer screening. [place]  Chief Financial Officer. Screening for Lung Cancer: [place]  Licensed conveyancer. JAMA. 2021;325(10):962-970.  5. Stable hepatomegaly. Electronically Signed   By: Ozell Daring M.D.   On: 12/07/2023 15:01   MR BRAIN W WO CONTRAST Result Date: 12/05/2023 EXAM: MRI BRAIN WITH AND WITHOUT CONTRAST 12/04/2023 11:53:14 PM TECHNIQUE: Multiplanar multisequence MRI of the head/brain was performed with and without the administration of 6 mL gadobutrol (GADAVIST) 1 MMOL/ML injection. COMPARISON: Comparison with prior head CT from 12/03/2023 as well as previous CT from 05/05/2023. CLINICAL HISTORY: Seizure, abnormal EEG with concern for localized etiology. Best images obtainable due to patient motion, unable to follow coaching on motion. 6 mL gadavist. 68 year old female with medical history significant for COPD, IDA, traumatic SDH 04/2023 with post-traumatic seizure disorder on multiple AEDs being admitted with  sepsis of unknown source, presenting with acute encephalopathy. EEG to evaluate for seizure. FINDINGS: BRAIN AND VENTRICLES: Cerebral volume within normal limits. Patchy T2/FLAIR hypertensity involving the periventricular and deep white matter of both cerebral hemispheres, consistent with chronic cerebral ischemic disease, mild for age. Superimposed remote lacunar infarct present at the posterior left frontal corona radiata. Symmetric increased T1 signal intensity noted about the bilateral basal ganglia, nonspecific but most commonly related to intrinsic liver disease. No evidence for acute or subacute infarct. No signal changes of acute seizure. No areas of chronic cortical infarction. Subdural collection overlying the left cerebral convexity is seen, measuring up to 4 mm in maximal thickness. Additional trace right sided subdural collection measuring 1 to 2 mm in thickness. Findings most likely reflect small residual subacute to chronic subdural hematomas. This was acute in nature on prior CT from 05/05/2023. Additionally, there is chronic hemosiderin staining involving the left frontal lobe, consistent with prior subarachnoid hemorrhage at this location (series 12, image 42). No acute blood product seen at this location on prior head CT. Finding could serve as a seizure focus. No mass lesion or midline shift. No hydrocephalus. Pituitary gland within normal limits. No intrinsic temporal lobe abnormality. Smooth pachymeningeal thickening seen about both cerebral hemispheres, likely reactive. No other abnormal enhancement. Normal flow voids. ORBITS: No acute abnormality. SINUSES: Mild scattered mucosal thickening present about the sphenoethmoidal sinuses. BONES AND SOFT TISSUES: Decreased T1 signal intensity noted within the visualized bone marrow, nonspecific, but most commonly related to anemia, smoking, or BCD. No acute soft tissue abnormality. IMPRESSION: 1. Small residual subacute to chronic subdural hematomas  overlying both cerebral convexities, left larger than right. No significant mass effect or midline shift. 2. Chronic hemosiderin staining in the left frontal lobe, consistent with prior subarachnoid hemorrhage. Finding could potentially serve as a seizure focus. 3. No other acute intracranial abnormality. 4. Underlying mild chronic microvascular ischemic disease. Electronically signed by: Morene Hoard MD 12/05/2023 12:27 AM EDT RP Workstation: [id number]   EEG adult Result Date: 12/04/2023 Shelton Arlin KIDD, MD     12/04/2023  4:44 PM Patient Name: Andrea Bell MRN: [id number] Epilepsy Attending: Arlin KIDD Shelton Referring Physician/Provider: Cleatus Delayne GAILS, MD Date: 12/04/2023 Duration: 28.24 mins Patient history: 68 y.o. 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 EEG to evaluate for seizure Level of alertness: Awake AEDs during EEG study: LEV Technical aspects: This EEG study was done with scalp electrodes positioned according to the 10-20 International system of electrode placement. Electrical activity was reviewed with band pass filter of 1-70Hz , sensitivity of 7 uV/mm, display speed of 8mm/sec with a 60Hz  notched filter applied as appropriate. EEG data were  recorded continuously and digitally stored.  Video monitoring was available and reviewed as appropriate. Description: The posterior dominant rhythm consists of 7 Hz activity of moderate voltage (25-35 uV) seen predominantly in posterior head regions, asymmetric ( left<right) and reactive to eye opening and eye closing. EEG showed continuous 3 to 6 Hz theta-delta slowing in left hemisphere as well as  5-7hz  theta-delta slowing in right hemisphere. Hyperventilation and photic stimulation were not performed.   ABNORMALITY - Continuous slow, generalized and lateralized left  hemisphere IMPRESSION: This study is [place] of cortical dysfunction arising from left hemisphere likely secondary to underlying structural abnormality, post-ictal state. Additionally there is generalized cerebral dysfunction ( encephalopathy). No seizures or epileptiform discharges were seen throughout the recording. Priyanka MALVA Krebs   CT ABDOMEN PELVIS WO CONTRAST Result Date: 12/03/2023 EXAM: CT ABDOMEN AND PELVIS WITHOUT CONTRAST 12/03/2023 09:48:47 PM TECHNIQUE: CT of the abdomen and pelvis was performed without the administration of intravenous contrast. Multiplanar reformatted images are provided for review. Automated exposure control, iterative reconstruction, and/or weight-based adjustment of the mA/kV was utilized to reduce the radiation dose to as low as reasonably achievable. COMPARISON: PET CT dated 06/24/2019. CLINICAL HISTORY: Sepsis. FINDINGS: LOWER CHEST: No acute abnormality. LIVER: Cirrhosis. Hepatic steatosis. GALLBLADDER AND BILE DUCTS: Gallbladder is unremarkable. No biliary ductal dilatation. SPLEEN: No acute abnormality. PANCREAS: No acute abnormality. ADRENAL GLANDS: No acute abnormality. KIDNEYS, URETERS AND BLADDER: No stones in the kidneys or ureters. No hydronephrosis. No perinephric or periureteral stranding. Urinary bladder is unremarkable. GI AND BOWEL: Stomach demonstrates no acute abnormality. Moderate rectal stool burden, suggesting mild fecal impaction. Early stercoral colitis is possible. The appendix is not discretely visualized. There is no bowel obstruction. PERITONEUM AND RETROPERITONEUM: No ascites. No free air. VASCULATURE: Aorta is normal in caliber. Atherosclerotic calcifications of the abdominal aorta and branch vessels. LYMPH NODES: No lymphadenopathy. REPRODUCTIVE ORGANS: Status post hysterectomy. BONES AND SOFT TISSUES: Mild degenerative changes of the lumbar spine. No acute osseous abnormality. No focal soft tissue abnormality. IMPRESSION: 1. Moderate rectal  stool burden, suggesting mild fecal impaction. Early stercoral colitis is possible. 2. Cirrhosis and hepatic steatosis. Electronically signed by: Pinkie Pebbles MD 12/03/2023 09:55 PM EDT RP Workstation: [id number]       Time spent: 50 min     Laneta Blunt, DO [facility] 12/07/2023, 4:37 PM    Dictation software may have been used to generate the above note. Typos may occur and escape review in typed/dictated notes. Please contact Dr Blunt directly for clarity if needed.  Staff may message me via secure chat in Epic  but this may not receive an immediate response,  please page me for urgent matters!  If 7PM-7AM, please contact night coverage [email or web address]

## 2023-12-08 ENCOUNTER — Inpatient Hospital Stay: Admit: 2023-12-08 | Discharge: 2023-12-08 | Disposition: A | Attending: Osteopathic Medicine

## 2023-12-08 DIAGNOSIS — A419 Sepsis, unspecified organism: Secondary | ICD-10-CM | POA: Diagnosis not present

## 2023-12-08 LAB — BASIC METABOLIC PANEL WITH GFR
Anion gap: 11 (ref 5–15)
BUN: 13 mg/dL (ref 8–23)
CO2: 21 mmol/L — ABNORMAL LOW (ref 22–32)
Calcium: 9.6 mg/dL (ref 8.9–10.3)
Chloride: 108 mmol/L (ref 98–111)
Creatinine, Ser: 0.61 mg/dL (ref 0.44–1.00)
GFR, Estimated: >60 mL/min (ref >60)
Glucose, Bld: 129 mg/dL — ABNORMAL HIGH (ref 70–99)
Potassium: 3 mmol/L — ABNORMAL LOW (ref 3.5–5.1)
Sodium: 140 mmol/L (ref 135–145)

## 2023-12-08 LAB — ECHOCARDIOGRAM COMPLETE
AR max vel: 2.01 cm2
AV Area VTI: 2.27 cm2
AV Area mean vel: 2.21 cm2
AV Mean grad: 4 mmHg
AV Peak grad: 8.8 mmHg
Ao pk vel: 1.48 m/s
Area-P 1/2: 3.36 cm2
Height: 67 in
MV VTI: 2.06 cm2
S' Lateral: 3.4 cm
Weight: 2342.17 [oz_av]

## 2023-12-08 LAB — CBC
HCT: 28.6 % — ABNORMAL LOW (ref 36.0–46.0)
Hemoglobin: 9.1 g/dL — ABNORMAL LOW (ref 12.0–15.0)
MCH: 31.9 pg (ref 26.0–34.0)
MCHC: 31.8 g/dL (ref 30.0–36.0)
MCV: 100.4 fL — ABNORMAL HIGH (ref 80.0–100.0)
Platelets: 131 K/uL — ABNORMAL LOW (ref 150–400)
RBC: 2.85 MIL/uL — ABNORMAL LOW (ref 3.87–5.11)
RDW: 16.8 % — ABNORMAL HIGH (ref 11.5–15.5)
WBC: 3.9 K/uL — ABNORMAL LOW (ref 4.0–10.5)
nRBC: 0 % (ref 0.0–0.2)

## 2023-12-08 LAB — CULTURE, BLOOD (ROUTINE X 2)
Culture: NO GROWTH
Culture: NO GROWTH

## 2023-12-08 MED ORDER — POTASSIUM CHLORIDE 20 MEQ PO PACK
40.0000 meq | PACK | Freq: Every day | ORAL | Status: DC
  Administered 2023-12-08 – 2023-12-09 (×2): 40 meq via ORAL
  Filled 2023-12-08 (×2): qty 2

## 2023-12-08 NOTE — Progress Notes (Signed)
 PROGRESS NOTE    Andrea Bell   FMW:[id number] DOB: 1955/07/07  DOA: 12/03/2023 Date of Service: 12/08/23 which is hospital day 5  PCP: Lora Odor, [facility] course / significant events:   HPI:  Andrea Bell is a 68 y.o. 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 History from husband is that following the seizure she has been confused, weak, shaking and mumbling which is far from her baseline   10/14: to ED. Fever 102.7, HR 108, lumbar puncture performed, CT head non-acute, started on sepsis fluids and broad spectrum abx, admitted for SIRS/Sepsis FUO. CT abd/pelv (+)hepatic steatosis/cirrhosis and ammonia slightly up, large stool burden likely impacted, procal up,  10/15: still npo pending swallow eval, if able to take lactulose will administer this otherwise will need enema / disimpaction. EEG pending. Remains encephalopathic but responsive, follows commands. Medic in ED reported dark stool but no obvious melena, pt is on iron  supplements, hemoccult ordered prn. Spoke w/ husband, she is a heavy drinker about 2 times per week, unsure last drink, question EtOH withdrawal to explain symptoms including seizure and fever, on CIWA. 10/16: ammonia slight increase. MRI brain no mass effect or new bleed, question old hemosiderin staining on L frontal prior subarachnoid hemorrhage may be seizure focus. SLP eval - significant cognitive deficits appear stable/similar compared to previous SLP eval 05/2023, minor aspiration risk mostly d/t cognition. Neurology consult - dc zongran, cont keppra , give thiamine  500 mg iv tid x 3d.  10/17: remains same/stable, husband reports she seems a lot better today. dc abx and monitor thru the weekend off abx. PT/OT to see 10/18: hold abx,  eval PE given persistent fever/tachycardia w/ no other infection found, if neg PE will ask ID to weigh in --> no PE, there is small pericardial effusion so will get Echo, messaged ID see A/P no new recs. Patient is much more cogent and talkative today and no complaints.  10/19: Echo done, pending read. Pt is alert and conversational today and remembers me from yesterday (had not remembered me until today). If remains alert, mobile, afebrile, tolerating diet, can finish IV Thiamine  and d/w ID tomorrow [facility]) and if no further concerns from ID/Neuro anticipate dc home tomorrow.      Consultants:  none  Procedures/Surgeries: Lumbar puncture in ED      ASSESSMENT & PLAN:   Acute metabolic encephalopathy, multifactorial Awake but severely abulic patient with minimal speech output who is not able to cooperate with most components of the exam. No gross focal weakness appreciated.  Ddx:  sepsis but no obvious infectious source found Seizure but doubt prolonged postictal EtOH component / withdrawals / Wernicke's hepatic encephalopathy / hyperammonemia  Advancing vascular dementia. Appreciate neurology consult  ID weekend coverage defers to Monday team  Neurologic checks  fall and aspiration precautions See below treating individual potential cause(s)  SIRS/sepsis but no obvious infectious source found Sepsis, unknown source Question SIRS without infection   Fever, intermittent (most recent 102.4 10/17 23:51) Elevation procalcitonin (1.65 10/14 --> 1.3 10/18) Sepsis criteria include fever, tachycardia albeit with normal WBC and lactic acid. No obvious infectious source, have reliably r/o: UTI, meningitis, viral resp illness, intraabdominal abscess, pneumonia Question stercoral colitis on CT but pt has not c/o abd pain. PE ruled out. Of note, cirrhosis can also increase procalcitonin.  In absence of clinical signs/symptoms of infection, persistent elevation procalcitonin, secure chat  message sent 10/18 to ID on remote call over the weekend, was acknowledged, they advised ask ID to see on Monday no other recs/orders at this time Hold abx for now w/ low suspicion for infection Pt is clinically improved daily / more verbal, and has no complaints that would point to infection  Echo as below  Seizure hx in setting of previous SDH/SAH and TBI, but doubt prolonged postictal Per neurology Regarding the possibility of subclinical seizures, she is prescribed Keppra  1500 mg BID and Zonegran 100 mg BID, but she has uncertain medication adherence. Husband stated that she drinks heavily a couple of times per week and therefore EtOH withdrawal may be playing a role (hypoactive delirium). 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 Holding home Zonegran given hyperammoniemia, consider restart if/when ammonia levels normalize IF can confirm continued EtOH abstinence Keppra  1500 mg bid   EtOH use question withdrawal / Wernicke's Spoke w/ husband, she is a heavy drinker about 2 times per week, unsure last drink, question EtOH withdrawal to explain symptoms including seizure and fever  Per neurology: Wernicke's encephalopathy is also possible. Due to her inability to participate fully with the exam, cannot determine if she has a subtle deficit of ocular motility or ataxia. CIWA Thiamine  500 mg IV tid x3 days then ok to return to 100 mg daily  Hepatic encephalopathy Cirrhosis and hepatic steatosis Mild ammonia increase - worsening Holding home Zonegran given hyperammoniemia, consider restart if/when ammonia levels normalize IF can confirm continued EtOH abstinence Lactulose  Monitor ammonia  Of note, cirrhosis can also increase  procalcitonin  Advancing vascular dementia. family history of dementia (mother). Therefore a possible underlying degenerative dementia could also be contributing to her symptoms and/or susceptibility to relatively minor insults causing AMS.  Supportive care    Cardiomegaly, with trace pericardial effusion  Nonspecific chest pain  Today reports chest is sore no pressure or SOB, no alleviating/exacerbating factors just bothersome, pericarditis a consideration w/ unexplained fever  Echocardiogram pending read  12-lead EKG prn  Thrombocytopenia Platelets 86,000 10/14 down from baseline of 138,000 --> 64000 10/15 --> 84000  Likely secondary to severe sepsis complicated by cirrhosis,  In the absence of leukocytosis and lactic acid we will explore other potential etiologies, rule out DIC Hematologic workup --> normal smear, elevated LDH, elevated D-dimer, normal fibrinogen   Trend CBC  Hypokalemia Replace as needed Monitor BMP  Moderate rectal stool burden, suggesting mild fecal impaction. Early stercoral colitis is possible. - clinically significant imaging findings Chronic constipation   Medic in ED reported dark stool but no obvious melena, pt is on iron  supplements, hemoccult ordered prn.  Chronic constipation per husband Abx as above would cover colitis, but pt has had no sx and no worsening off abx   Lactulose po   Pt has had bowel movements this admission, monitor I&O   Iron  deficiency anemia, unspecified H&H stable Continue to monitor   Essential hypertension, benign Holding antihypertensives in the setting of sepsis Hydralazine IV as needed   COPD (chronic obstructive pulmonary disease)  Not acutely exacerbated DuoNebs as needed  Left upper lobe lung nodule 7 x 9 mm  slight increased size since the 2023 exam.  Low-grade adenocarcinoma cannot  be excluded.  Continued follow-up is recommended.  Elevated D-Dimer, Wells 0 Low suspicion for PE in absence of major risk  factors, tachycardia, chest pain, hypoxia, resp distress Done for DIC w/u as above PE imaging --> neg     No concerns based on BMI: Body mass index is 22.93 kg/m.SABRA Significantly low or high BMI is associated with higher medical risk.  Underweight - under 18  overweight - 25 to 29 obese - 30 or more Class 1 obesity: BMI of 30.0 to 34 Class 2 obesity: BMI of 35.0 to 39 Class 3 obesity: BMI of 40.0 to 49 Super Morbid Obesity: BMI 50-59 Super-super Morbid Obesity: BMI 60+ Healthy nutrition and physical activity advised as adjunct to other disease management and risk reduction treatments    DVT prophylaxis: hold Rx d/t low Plt  IV fluids: LR 100 cc/h continuous IV fluids  Nutrition: pending swallow eval  Central lines / other devices: none  Code Status: FULL CODE ACP documentation reviewed: none on file in [facility]  TOC needs: TBD Medical barriers to dispo: w/u SIRS/sepsis Expected medical readiness for discharge tomorrow pend ID recs              Subjective / Brief ROS:  Patient reports no complaints - when asked about chest pain / SOB she states some soreness in chest, constant, mild / bothersome,   Family Communication: none at this time will call husband later today.     Objective Findings:  Vitals:   12/07/23 2349 12/08/23 0402 12/08/23 0826 12/08/23 1226  BP: 123/60 118/61 112/63 110/61  Pulse: 95 82 89 79  Resp: 18 18 18 18   Temp: 98.4 F (36.9 C) 98.6 F (37 C) 98.6 F (37 C) 98.6 F (37 C)  TempSrc:      SpO2: 98% 97% 98% 99%  Weight:      Height:        Intake/Output Summary (Last 24 hours) at 12/08/2023 1411 Last data filed at 12/08/2023 0930 Gross per 24 hour  Intake 350 ml  Output --  Net 350 ml   Filed Weights   12/03/23 1432  Weight: 66.4 kg    Examination:  Physical Exam Constitutional:      General: She is not in acute distress.    Appearance: She is not ill-appearing.  Cardiovascular:     Rate and Rhythm: Normal rate  and regular rhythm.  Pulmonary:     Effort: Pulmonary effort is normal.     Breath sounds: Normal breath sounds.  Abdominal:     General: Bowel sounds are normal. There is no distension.     Palpations: Abdomen is soft.     Tenderness: There is no guarding or rebound.  Musculoskeletal:     Right lower leg: No edema.     Left lower leg: No edema.     Comments: (+)chest wall tenderness on palpation   Skin:    General: Skin is warm and dry.  Neurological:     Mental Status: She is alert and oriented to person, place, and time.     Cranial Nerves: No cranial nerve deficit.     Coordination: Coordination normal.  Psychiatric:        Behavior: Behavior normal.          Scheduled Medications:   budesonide -glycopyrrolate-formoterol   2 puff Inhalation BID   lactulose  20 g Oral Daily   levETIRAcetam   1,500 mg Oral BID   magnesium  chloride  2 tablet Oral Daily  potassium chloride   40 mEq Oral Daily    Continuous Infusions:  thiamine  (VITAMIN B1) injection 500 mg (12/08/23 1020)    PRN Medications:  acetaminophen  **OR** acetaminophen , albuterol , LORazepam , ondansetron  **OR** ondansetron  (ZOFRAN ) IV  Antimicrobials from admission:  Anti-infectives (From admission, onward)    Start     Dose/Rate Route Frequency Ordered Stop   12/05/23 0800  vancomycin (VANCOREADY) IVPB 1500 mg/300 mL  Status:  Discontinued        1,500 mg 150 mL/hr over 120 Minutes Intravenous Every 24 hours 12/04/23 1015 12/04/23 1017   12/04/23 1800  cefTRIAXone (ROCEPHIN) 2 g in sodium chloride  0.9 % 100 mL IVPB  Status:  Discontinued        2 g 200 mL/hr over 30 Minutes Intravenous Daily-1800 12/04/23 1018 12/07/23 0757   12/04/23 0800  vancomycin (VANCOREADY) IVPB 750 mg/150 mL  Status:  Discontinued        750 mg 150 mL/hr over 60 Minutes Intravenous Every 12 hours 12/03/23 2339 12/04/23 1015   12/04/23 0600  metroNIDAZOLE (FLAGYL) IVPB 500 mg  Status:  Discontinued        500 mg 100 mL/hr over 60  Minutes Intravenous Every 12 hours 12/03/23 2155 12/04/23 1017   12/04/23 0000  ceFEPIme (MAXIPIME) 2 g in sodium chloride  0.9 % 100 mL IVPB  Status:  Discontinued        2 g 200 mL/hr over 30 Minutes Intravenous Every 8 hours 12/03/23 2226 12/04/23 1017   12/03/23 2230  vancomycin (VANCOREADY) IVPB 500 mg/100 mL        500 mg 100 mL/hr over 60 Minutes Intravenous  Once 12/03/23 2224 12/04/23 0348   12/03/23 2200  vancomycin (VANCOCIN) IVPB 1000 mg/200 mL premix  Status:  Discontinued        1,000 mg 200 mL/hr over 60 Minutes Intravenous  Once 12/03/23 2155 12/03/23 2224   12/03/23 1530  ceFEPIme (MAXIPIME) 2 g in sodium chloride  0.9 % 100 mL IVPB        2 g 200 mL/hr over 30 Minutes Intravenous  Once 12/03/23 1525 12/03/23 1636   12/03/23 1530  metroNIDAZOLE (FLAGYL) IVPB 500 mg        500 mg 100 mL/hr over 60 Minutes Intravenous  Once 12/03/23 1525 12/03/23 1822   12/03/23 1530  vancomycin (VANCOCIN) IVPB 1000 mg/200 mL premix        1,000 mg 200 mL/hr over 60 Minutes Intravenous  Once 12/03/23 1525 12/03/23 2001           Data Reviewed:  I have personally reviewed the following...  CBC: Recent Labs  Lab 12/03/23 1439 12/03/23 2009 12/04/23 0520 12/05/23 0439 12/07/23 0305 12/08/23 0550  WBC 7.1 7.1 6.5 7.8 4.9 3.9*  NEUTROABS 5.9 5.4  --   --   --   --   HGB 11.0* 10.3* 10.4* 10.2* 8.8* 9.1*  HCT 34.1* 31.5* 32.1* 30.2* 26.7* 28.6*  MCV 96.9 96.9 97.0 96.2 96.7 100.4*  PLT 86* 72* 64* 83* 101* 131*   Basic Metabolic Panel: Recent Labs  Lab 12/04/23 0555 12/04/23 1633 12/05/23 0439 12/07/23 0305 12/07/23 1342 12/08/23 0550  NA 137  --  138 137 140 140  K 2.9*  --  2.9* 2.6* 3.6 3.0*  CL 104  --  107 107 112* 108  CO2 22  --  19* 21* 21* 21*  GLUCOSE 105*  --  91 142* 120* 129*  BUN 21  --  19 14 15  13  CREATININE 0.61  --  0.73 0.76 0.50 0.61  CALCIUM  9.6  --  9.6 9.5 9.7 9.6  MG  --  1.2* 1.8 1.5*  --   --    GFR: Estimated Creatinine Clearance:  66.4 mL/min (by C-G formula based on SCr of 0.61 mg/dL). Liver Function Tests: Recent Labs  Lab 12/03/23 1439 12/04/23 0555 12/05/23 0439 12/07/23 0305  AST 101* 107* 65* 43*  ALT 25 29 25 20   ALKPHOS 155* 113 117 133*  BILITOT 2.7* 2.1* 1.9* 0.9  PROT 8.3* 6.7 6.4* 5.9*  ALBUMIN 4.5 3.2* 3.3* 3.2*   No results for input(s): LIPASE, AMYLASE in the last 168 hours. Recent Labs  Lab 12/03/23 2125 12/04/23 1633 12/05/23 0439 12/07/23 0305  AMMONIA 46* 47* 56* 57*   Coagulation Profile: Recent Labs  Lab 12/03/23 1439 12/04/23 0520  INR 1.3* 1.5*   Cardiac Enzymes: No results for input(s): CKTOTAL, CKMB, CKMBINDEX, TROPONINI in the last 168 hours. BNP (last 3 results) No results for input(s): PROBNP in the last 8760 hours. HbA1C: No results for input(s): HGBA1C in the last 72 hours. CBG: No results for input(s): GLUCAP in the last 168 hours. Lipid Profile: No results for input(s): CHOL, HDL, LDLCALC, TRIG, CHOLHDL, LDLDIRECT in the last 72 hours. Thyroid  Function Tests: No results for input(s): TSH, T4TOTAL, FREET4, T3FREE, THYROIDAB in the last 72 hours. Anemia Panel: No results for input(s): VITAMINB12, FOLATE, FERRITIN, TIBC, IRON , RETICCTPCT in the last 72 hours. Most Recent Urinalysis On File:     Component Value Date/Time   COLORURINE YELLOW (A) 12/03/2023 1436   APPEARANCEUR CLEAR (A) 12/03/2023 1436   APPEARANCEUR Clear 02/05/2013 1159   LABSPEC 1.016 12/03/2023 1436   LABSPEC 1.024 02/05/2013 1159   PHURINE 8.0 12/03/2023 1436   GLUCOSEU NEGATIVE 12/03/2023 1436   GLUCOSEU Negative 02/05/2013 1159   HGBUR SMALL (A) 12/03/2023 1436   BILIRUBINUR NEGATIVE 12/03/2023 1436   BILIRUBINUR Negative 02/05/2013 1159   KETONESUR 20 (A) 12/03/2023 1436   PROTEINUR 30 (A) 12/03/2023 1436   NITRITE NEGATIVE 12/03/2023 1436   LEUKOCYTESUR NEGATIVE 12/03/2023 1436   LEUKOCYTESUR Negative 02/05/2013 1159   Sepsis  Labs: @LABRCNTIP (procalcitonin:4,lacticidven:4) Microbiology: Recent Results (from the past 240 hours)  Blood culture (routine x 2)     Status: None   Collection Time: 12/03/23  2:39 PM   Specimen: BLOOD  Result Value Ref Range Status   Specimen Description BLOOD RIGHT ANTECUBITAL  Final   Special Requests   Final    BOTTLES DRAWN AEROBIC AND ANAEROBIC Blood Culture results may not be optimal due to an inadequate volume of blood received in culture bottles   Culture   Final    NO GROWTH 5 DAYS Performed at [facility], [address] [place], [place] [address]    Report Status 12/08/2023 FINAL  Final  Resp panel by RT-PCR (RSV, Flu A&B, Covid) Anterior Nasal Swab     Status: None   Collection Time: 12/03/23  2:39 PM   Specimen: Anterior Nasal Swab  Result Value Ref Range Status   SARS Coronavirus 2 by RT PCR NEGATIVE NEGATIVE Final    Comment: (NOTE) SARS-CoV-2 target nucleic acids are NOT DETECTED.  The SARS-CoV-2 RNA is generally detectable in upper respiratory specimens during the acute phase of infection. The lowest concentration of SARS-CoV-2 viral copies this assay can detect is 138 copies/mL. A negative result does not preclude SARS-Cov-2 infection and should not be used as the sole basis for treatment or other patient  management decisions. A negative result may occur with  improper specimen collection/handling, submission of specimen other than nasopharyngeal swab, presence of viral mutation(s) within the areas targeted by this assay, and inadequate number of viral copies(<138 copies/mL). A negative result must be combined with clinical observations, patient history, and epidemiological information. The expected result is Negative.  Fact Sheet for Patients:  BloggerCourse.com  Fact Sheet for Healthcare Providers:  SeriousBroker.it  This test is no t yet approved or cleared by the [place]  FDA and   has been authorized for detection and/or diagnosis of SARS-CoV-2 by FDA under an Emergency Use Authorization (EUA). This EUA will remain  in effect (meaning this test can be used) for the duration of the COVID-19 declaration under Section 564(b)(1) of the Act, 21 U.S.C.section 360bbb-3(b)(1), unless the authorization is terminated  or revoked sooner.       Influenza A by PCR NEGATIVE NEGATIVE Final   Influenza B by PCR NEGATIVE NEGATIVE Final    Comment: (NOTE) The Xpert Xpress SARS-CoV-2/FLU/RSV plus assay is intended as an aid in the diagnosis of influenza from Nasopharyngeal swab specimens and should not be used as a sole basis for treatment. Nasal washings and aspirates are unacceptable for Xpert Xpress SARS-CoV-2/FLU/RSV testing.  Fact Sheet for Patients: BloggerCourse.com  Fact Sheet for Healthcare Providers: SeriousBroker.it  This test is not yet approved or cleared by the [place]  FDA and has been authorized for detection and/or diagnosis of SARS-CoV-2 by FDA under an Emergency Use Authorization (EUA). This EUA will remain in effect (meaning this test can be used) for the duration of the COVID-19 declaration under Section 564(b)(1) of the Act, 21 U.S.C. section 360bbb-3(b)(1), unless the authorization is terminated or revoked.     Resp Syncytial Virus by PCR NEGATIVE NEGATIVE Final    Comment: (NOTE) Fact Sheet for Patients: BloggerCourse.com  Fact Sheet for Healthcare Providers: SeriousBroker.it  This test is not yet approved or cleared by the [place]  FDA and has been authorized for detection and/or diagnosis of SARS-CoV-2 by FDA under an Emergency Use Authorization (EUA). This EUA will remain in effect (meaning this test can be used) for the duration of the COVID-19 declaration under Section 564(b)(1) of the Act, 21 U.S.C. section 360bbb-3(b)(1),  unless the authorization is terminated or revoked.  Performed at [facility], [address] [place], [place] [address]   Blood culture (routine x 2)     Status: None   Collection Time: 12/03/23  3:05 PM   Specimen: BLOOD  Result Value Ref Range Status   Specimen Description BLOOD BLOOD RIGHT HAND  Final   Special Requests   Final    BOTTLES DRAWN AEROBIC AND ANAEROBIC Blood Culture results may not be optimal due to an inadequate volume of blood received in culture bottles   Culture   Final    NO GROWTH 5 DAYS Performed at [facility], [address] [place], [place] [address]    Report Status 12/08/2023 FINAL  Final  CSF culture w Gram Stain     Status: None   Collection Time: 12/03/23  6:56 PM   Specimen: CSF; Cerebrospinal Fluid  Result Value Ref Range Status   Specimen Description   Final    CSF Performed at [facility], [address], [place], [place] [address]    Special Requests   Final    NONE Performed at [facility], [address], [place], [place] [address]    Gram Stain   Final  NO ORGANISMS SEEN WBC SEEN RED BLOOD CELLS PRESENT GRAM STAIN REVIEWED-AGREE WITH RESULT [id number] AT 0029, ADC    Culture   Final    NO GROWTH 3 DAYS Performed at [facility], [address] [place], [place] [address]    Report Status 12/07/2023 FINAL  Final  Respiratory (~20 pathogens) panel by PCR     Status: None   Collection Time: 12/04/23 12:18 AM   Specimen: Nasopharyngeal Swab; Respiratory  Result Value Ref Range Status   Adenovirus NOT DETECTED NOT DETECTED Final   Coronavirus 229E NOT DETECTED NOT DETECTED Final    Comment: (NOTE) The Coronavirus on the Respiratory Panel, DOES NOT test for the novel  Coronavirus (2019 nCoV)    Coronavirus HKU1 NOT DETECTED NOT DETECTED Final   Coronavirus NL63 NOT DETECTED NOT DETECTED Final   Coronavirus OC43 NOT DETECTED NOT DETECTED Final   Metapneumovirus NOT DETECTED NOT  DETECTED Final   Rhinovirus / Enterovirus NOT DETECTED NOT DETECTED Final   Influenza A NOT DETECTED NOT DETECTED Final   Influenza B NOT DETECTED NOT DETECTED Final   Parainfluenza Virus 1 NOT DETECTED NOT DETECTED Final   Parainfluenza Virus 2 NOT DETECTED NOT DETECTED Final   Parainfluenza Virus 3 NOT DETECTED NOT DETECTED Final   Parainfluenza Virus 4 NOT DETECTED NOT DETECTED Final   Respiratory Syncytial Virus NOT DETECTED NOT DETECTED Final   Bordetella pertussis NOT DETECTED NOT DETECTED Final   Bordetella Parapertussis NOT DETECTED NOT DETECTED Final   Chlamydophila pneumoniae NOT DETECTED NOT DETECTED Final   Mycoplasma pneumoniae NOT DETECTED NOT DETECTED Final    Comment: Performed at [facility], [address], [place], [place] [address]      Radiology Studies last 3 days: CT Angio Chest Pulmonary Embolism (PE) W or WO Contrast Result Date: 12/07/2023 CLINICAL DATA:  persistent fever, tachycardia, elevated d dimer, no other infectious source found EXAM: CT ANGIOGRAPHY CHEST WITH CONTRAST TECHNIQUE: Multidetector CT imaging of the chest was performed using the standard protocol during bolus administration of intravenous contrast. Multiplanar CT image reconstructions and MIPs were obtained to evaluate the vascular anatomy. RADIATION DOSE REDUCTION: This exam was performed according to the departmental dose-optimization program which includes automated exposure control, adjustment of the mA and/or kV according to patient size and/or use of iterative reconstruction technique. CONTRAST:  75mL OMNIPAQUE IOHEXOL 350 MG/ML SOLN COMPARISON:  12/03/2023, 11/19/2023 FINDINGS: Cardiovascular: This is a technically adequate evaluation of the pulmonary vasculature. No filling defects or pulmonary emboli. The heart is enlarged, with prominent right atrial dilatation. Trace pericardial effusion. No evidence of thoracic aortic aneurysm or dissection. Atherosclerosis of the aorta.  Mediastinum/Nodes: No enlarged mediastinal, hilar, or axillary lymph nodes. Thyroid  gland, trachea, and esophagus demonstrate no significant findings. Lungs/Pleura: Stable upper lobe predominant emphysema. There is a 7 x 9 mm ground-glass left upper lobe pulmonary nodule, reference image 21/5. While this has not changed significantly since the recent 2025 CT, this has increased in size since the 2023 exam where I measure the this as 6 x 4 mm. Low-grade adenocarcinoma cannot be excluded. No acute airspace disease, effusion, or pneumothorax. The central airways are patent. Upper Abdomen: Continued hepatomegaly. Stable gastric fundal diverticulum. Musculoskeletal: No acute or destructive bony abnormalities. Reconstructed images demonstrate no additional findings. Review of the MIP images confirms the above findings. IMPRESSION: 1. No evidence of pulmonary embolus. 2. Cardiomegaly, with trace pericardial effusion. 3. 7 x 9 mm left upper lobe ground-glass pulmonary nodule, demonstrating slight increased  size since the 2023 exam. Low-grade adenocarcinoma cannot be excluded. Continued follow-up is recommended. 4. Pulmonary emphysema is present. Pulmonary emphysema is an independent risk factor for lung cancer. Recommend patient be considered for lung cancer screening. [place]  Chief Financial Officer. Screening for Lung Cancer: [place]  Licensed conveyancer. JAMA. 2021;325(10):962-970. 5. Stable hepatomegaly. Electronically Signed   By: Ozell Daring M.D.   On: 12/07/2023 15:01   MR BRAIN W WO CONTRAST Result Date: 12/05/2023 EXAM: MRI BRAIN WITH AND WITHOUT CONTRAST 12/04/2023 11:53:14 PM TECHNIQUE: Multiplanar multisequence MRI of the head/brain was performed with and without the administration of 6 mL gadobutrol (GADAVIST) 1 MMOL/ML injection. COMPARISON: Comparison with prior head CT from 12/03/2023 as well as previous CT from 05/05/2023. CLINICAL HISTORY: Seizure, abnormal EEG with  concern for localized etiology. Best images obtainable due to patient motion, unable to follow coaching on motion. 6 mL gadavist. 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 EEG to evaluate for seizure. FINDINGS: BRAIN AND VENTRICLES: Cerebral volume within normal limits. Patchy T2/FLAIR hypertensity involving the periventricular and deep white matter of both cerebral hemispheres, consistent with chronic cerebral ischemic disease, mild for age. Superimposed remote lacunar infarct present at the posterior left frontal corona radiata. Symmetric increased T1 signal intensity noted about the bilateral basal ganglia, nonspecific but most commonly related to intrinsic liver disease. No evidence for acute or subacute infarct. No signal changes of acute seizure. No areas of chronic cortical infarction. Subdural collection overlying the left cerebral convexity is seen, measuring up to 4 mm in maximal thickness. Additional trace right sided subdural collection measuring 1 to 2 mm in thickness. Findings most likely reflect small residual subacute to chronic subdural hematomas. This was acute in nature on prior CT from 05/05/2023. Additionally, there is chronic hemosiderin staining involving the left frontal lobe, consistent with prior subarachnoid hemorrhage at this location (series 12, image 42). No acute blood product seen at this location on prior head CT. Finding could serve as a seizure focus. No mass lesion or midline shift. No hydrocephalus. Pituitary gland within normal limits. No intrinsic temporal lobe abnormality. Smooth pachymeningeal thickening seen about both cerebral hemispheres, likely reactive. No other abnormal enhancement. Normal flow voids. ORBITS: No acute abnormality. SINUSES: Mild scattered mucosal thickening present about  the sphenoethmoidal sinuses. BONES AND SOFT TISSUES: Decreased T1 signal intensity noted within the visualized bone marrow, nonspecific, but most commonly related to anemia, smoking, or BCD. No acute soft tissue abnormality. IMPRESSION: 1. Small residual subacute to chronic subdural hematomas overlying both cerebral convexities, left larger than right. No significant mass effect or midline shift. 2. Chronic hemosiderin staining in the left frontal lobe, consistent with prior subarachnoid hemorrhage. Finding could potentially serve as a seizure focus. 3. No other acute intracranial abnormality. 4. Underlying mild chronic microvascular ischemic disease. Electronically signed by: Morene Hoard MD 12/05/2023 12:27 AM EDT RP Workstation: [id number]   EEG adult Result Date: 12/04/2023 Shelton Arlin KIDD, MD     12/04/2023  4:44 PM Patient Name: Andrea Bell MRN: [id number] Epilepsy Attending: Arlin KIDD Shelton Referring Physician/Provider: Cleatus Delayne GAILS, MD Date: 12/04/2023 Duration: 28.24 mins Patient history: 68 y.o. 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 EEG to evaluate for seizure Level of alertness:  Awake AEDs during EEG study: LEV Technical aspects: This EEG study was done with scalp electrodes positioned according to the 10-20 International system of electrode placement. Electrical activity was reviewed with band pass filter of 1-70Hz , sensitivity of 7 uV/mm, display speed of 55mm/sec with a 60Hz  notched filter applied as appropriate. EEG data were recorded continuously and digitally stored.  Video monitoring was available and reviewed as appropriate. Description: The posterior dominant rhythm consists of 7 Hz activity of moderate voltage (25-35 uV) seen predominantly in posterior head regions, asymmetric ( left<right) and reactive  to eye opening and eye closing. EEG showed continuous 3 to 6 Hz theta-delta slowing in left hemisphere as well as  5-7hz  theta-delta slowing in right hemisphere. Hyperventilation and photic stimulation were not performed.   ABNORMALITY - Continuous slow, generalized and lateralized left hemisphere IMPRESSION: This study is jersey of cortical dysfunction arising from left hemisphere likely secondary to underlying structural abnormality, post-ictal state. Additionally there is generalized cerebral dysfunction ( encephalopathy). No seizures or epileptiform discharges were seen throughout the recording. Priyanka O Yadav       Time spent: 50 min     Laneta Blunt, DO [facility] 12/08/2023, 2:11 PM    Dictation software may have been used to generate the above note. Typos may occur and escape review in typed/dictated notes. Please contact Dr Blunt directly for clarity if needed.  Staff may message me via secure chat in Epic  but this may not receive an immediate response,  please page me for urgent matters!  If 7PM-7AM, please contact night coverage [email or web address]

## 2023-12-08 NOTE — Plan of Care (Signed)
  Problem: Fluid Volume: Goal: Hemodynamic stability will improve Outcome: Progressing   Problem: Clinical Measurements: Goal: Diagnostic test results will improve Outcome: Progressing Goal: Signs and symptoms of infection will decrease Outcome: Progressing   Problem: Respiratory: Goal: Ability to maintain adequate ventilation will improve Outcome: Progressing   Problem: Health Behavior/Discharge Planning: Goal: Ability to manage health-related needs will improve Outcome: Progressing   Problem: Clinical Measurements: Goal: Ability to maintain clinical measurements within normal limits will improve Outcome: Progressing Goal: Will remain free from infection Outcome: Progressing Goal: Diagnostic test results will improve Outcome: Progressing Goal: Respiratory complications will improve Outcome: Progressing Goal: Cardiovascular complication will be avoided Outcome: Progressing   Problem: Coping: Goal: Level of anxiety will decrease Outcome: Progressing   Problem: Safety: Goal: Ability to remain free from injury will improve Outcome: Progressing

## 2023-12-08 NOTE — Progress Notes (Signed)
*  PRELIMINARY RESULTS* Echocardiogram 2D Echocardiogram has been performed.  Tomica Arseneault C Deloris Moger 12/08/2023, 1:51 PM

## 2023-12-09 ENCOUNTER — Encounter: Payer: Self-pay | Admitting: Oncology

## 2023-12-09 ENCOUNTER — Other Ambulatory Visit: Payer: Self-pay

## 2023-12-09 DIAGNOSIS — F1090 Alcohol use, unspecified, uncomplicated: Secondary | ICD-10-CM

## 2023-12-09 DIAGNOSIS — K760 Fatty (change of) liver, not elsewhere classified: Secondary | ICD-10-CM

## 2023-12-09 DIAGNOSIS — K7030 Alcoholic cirrhosis of liver without ascites: Secondary | ICD-10-CM

## 2023-12-09 DIAGNOSIS — G40909 Epilepsy, unspecified, not intractable, without status epilepticus: Secondary | ICD-10-CM | POA: Diagnosis not present

## 2023-12-09 MED ORDER — POTASSIUM CHLORIDE ER 10 MEQ PO TBCR
40.0000 meq | EXTENDED_RELEASE_TABLET | Freq: Every day | ORAL | 0 refills | Status: AC
  Filled 2023-12-09: qty 60, 30d supply, fill #0
  Filled 2023-12-09: qty 120, 30d supply, fill #0

## 2023-12-09 MED ORDER — LACTULOSE 10 GM/15ML PO SOLN
20.0000 g | Freq: Every day | ORAL | 0 refills | Status: AC
  Filled 2023-12-09: qty 946, 31d supply, fill #0

## 2023-12-09 MED ORDER — MAGNESIUM CHLORIDE 64 MG PO TBEC
1.0000 | DELAYED_RELEASE_TABLET | Freq: Every day | ORAL | 0 refills | Status: AC
  Filled 2023-12-09: qty 30, 30d supply, fill #0

## 2023-12-09 NOTE — Plan of Care (Signed)
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
## 2023-12-09 NOTE — Discharge Summary (Signed)
 Physician Discharge Summary   Patient: Andrea Bell MRN: [id number]  DOB: 11/18/1955   Admit:     Date of Admission: 12/03/2023 Admitted from: home   Discharge: Date of discharge: 12/09/23 Disposition: Home health Condition at discharge: good  CODE STATUS: FULL CODE     Discharge Physician: Laneta Blunt, DO [facility]     PCP: Lora Odor, FNP  Recommendations for Outpatient Follow-up:  Follow up with PCP Lora Odor, FNP in 1-2 weeks Will need follow up as below for lung nodule    Discharge Instructions     Diet general   Complete by: As directed    DIET DYS 3 Room service appropriate? No; Fluid consistency: Thin  Diet effective now      Comments: Extra Gravies on meats, potatoes.  May have baked/sweet potatoes per Speech ok. Question Answer Comment Room service appropriate? No   Fluid consistency: Thin   Increase activity slowly   Complete by: As directed          Discharge Diagnoses: Principal Problem:   Sepsis, unknown source [facility]) Active Problems:   Acute metabolic encephalopathy   Thrombocytopenia   Seizure disorder (HCC)   Abnormal LFTs   COPD (chronic obstructive pulmonary disease) (HCC)   Essential hypertension, benign   Iron  deficiency anemia, unspecified   Sepsis, unspecified organism [facility])      Hospital course / significant events:   HPI:  Andrea Bell is a 68 y.o. 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 History from husband is that following the seizure she has been confused, weak, shaking and mumbling which is far from her baseline   10/14: to ED. Fever 102.7, HR 108, lumbar puncture performed, CT head non-acute, started on fluids and broad spectrum abx, admitted for SIRS/Sepsis  FUO. CT abd/pelv (+)hepatic steatosis/cirrhosis and ammonia slightly up, large stool burden likely impacted, procal up,  10/15: npo pending swallow eval, if able to take lactulose will administer this otherwise will need enema / disimpaction. EEG pending. Remains encephalopathic but responsive, follows commands. Medic in ED reported dark stool but no obvious melena, pt is on iron  supplements, hemoccult ordered prn. Spoke w/ husband, she is a heavy drinker about 2 times per week, unsure last drink, question EtOH withdrawal to explain symptoms  10/16: ammonia slight increase. MRI brain no mass effect or new bleed, question old hemosiderin staining on L frontal prior subarachnoid hemorrhage may be seizure focus. SLP eval - significant cognitive deficits appear stable/similar compared to previous SLP eval 05/2023, minor aspiration risk mostly d/t cognition. Neurology consult - dc zongran, cont keppra , give thiamine  500 mg iv tid x 3d.  10/17: remains same/stable, husband reports she seems a lot better today. dc abx and monitor thru the weekend off abx. PT/OT to see 10/18: hold abx, eval PE given persistent fever/tachycardia w/ no other infection found, if neg PE will ask ID to weigh in --> no PE, there is small pericardial effusion so will get Echo, messaged ID see A/P no new recs and deferring until Mon. Patient is much more cogent and talkative today and no complaints.  10/19: Echo EF 55-60 no RWMA and normal diastolic, poor windows for eval but no apparent pericardial effusion noted. Pt is alert and conversational today and remembers me from yesterday (had not remembered me until today).  If remains alert, mobile, afebrile, tolerating diet, can finish IV Thiamine  and d/w ID tomorrow [facility]) and if no further concerns from ID/Neuro anticipate dc home tomorrow.  10/20: ID agrees no infection, ok for dc home      Consultants:  none  Procedures/Surgeries: Lumbar puncture in ED      ASSESSMENT & PLAN:    Acute metabolic encephalopathy, multifactorial - resolved Appreciate neurology consult  ID agrees no infection ok to dc home today  See below treating individual potential cause(s)  SIRS/sepsis but no obvious infectious source found Sepsis, unknown source - sepsis ruled OUT, ID agrees no infection  Question SIRS without infection   Fever, intermittent (most recent 102.4 10/17 23:51) Elevation procalcitonin (1.65 10/14 --> 1.3 10/18) Ok for dc on no abx  Seizure hx in setting of previous SDH/SAH and TBI, but doubt prolonged postictal Per neurology Regarding the possibility of subclinical seizures, she is prescribed Keppra  1500 mg BID and Zonegran 100 mg BID, but she has uncertain medication adherence. Husband stated that she drinks heavily a couple of times per week and therefore EtOH withdrawal may be playing a role (hypoactive delirium). 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 Holding home Zonegran given hyperammoniemia, consider restart if/when ammonia levels normalize IF can confirm continued EtOH abstinence Keppra  1500 mg bid   EtOH use question withdrawal / Wernicke's Spoke w/ husband, she is a heavy drinker about 2 times per week, unsure last drink, question EtOH withdrawal to explain symptoms including seizure and fever  Received Thiamine  500 mg IV tid x3 days then ok to return to 100 mg daily   Hepatic encephalopathy Cirrhosis and hepatic steatosis Mild ammonia increase - worsening Holding home Zonegran given hyperammoniemia, consider restart if/when ammonia levels normalize IF can confirm continued EtOH abstinence Lactulose  Monitor ammonia  Of note, cirrhosis can also increase procalcitonin  Advancing vascular dementia. family  history of dementia (mother). Therefore a possible underlying degenerative dementia could also be contributing to her symptoms and/or susceptibility to relatively minor insults causing AMS.  Supportive care    Cardiomegaly, with trace pericardial effusion  Nonspecific chest pain  Echocardiogram no concerns   Thrombocytopenia  Trend CBC  Hypokalemia Replace as needed Monitor BMP  Moderate rectal stool burden, suggesting mild fecal impaction. Early stercoral colitis is possible. - clinically significant imaging findings Pt has had bowel movements this admission, monitor Lactulose as above    Iron  deficiency anemia, unspecified H&H stable Continue to monitor   Essential hypertension, benign Holding antihypertensives in the setting of sepsis BP has been at goal, meds per med rec    COPD (chronic obstructive pulmonary disease)  Not acutely exacerbated Monitor   Left upper lobe lung nodule 7 x 9 mm  slight increased size since the 2023 exam.  Low-grade adenocarcinoma cannot be excluded.  Continued follow-up is recommended.  Elevated D-Dimer, Wells 0 Low suspicion for PE in absence of major risk factors, tachycardia, chest pain, hypoxia, resp distress Done for DIC w/u as above PE imaging --> neg     No concerns based on BMI: Body mass index is 22.93 kg/m.SABRA Significantly low or high BMI is associated with higher medical risk.  Underweight - under 18  overweight - 25 to 29 obese - 30 or more Class 1 obesity:  BMI of 30.0 to 34 Class 2 obesity: BMI of 35.0 to 39 Class 3 obesity: BMI of 40.0 to 49 Super Morbid Obesity: BMI 50-59 Super-super Morbid Obesity: BMI 60+ Healthy nutrition and physical activity advised as adjunct to other disease management and risk reduction treatments           Discharge Instructions  Allergies as of 12/09/2023       Reactions   Codeine  Nausea And Vomiting   Was used while in hospital 02/24/14 and no reaction noted--01/06/15 BC Other  Reaction(s): itching and feels faint        Medication List     STOP taking these medications    amLODipine  10 MG tablet Commonly known as: NORVASC    aspirin  EC 81 MG tablet   zonisamide 100 MG capsule Commonly known as: ZONEGRAN       TAKE these medications    albuterol  (2.5 MG/3ML) 0.083% nebulizer solution Commonly known as: PROVENTIL  Take 3 mLs (2.5 mg total) by nebulization every 4 (four) hours as needed for wheezing or shortness of breath.   albuterol  108 (90 Base) MCG/ACT inhaler Commonly known as: VENTOLIN  HFA Inhale 2 puffs into the lungs every 6 (six) hours as needed for wheezing or shortness of breath.   atorvastatin  40 MG tablet Commonly known as: LIPITOR Take 1 tablet by mouth daily.   bisacodyl  5 MG EC tablet Commonly known as: DULCOLAX Take 5 mg by mouth daily as needed for moderate constipation.   Blood Pressure Cuff Misc 1 each by Does not apply route daily.   diazepam 5 MG tablet Commonly known as: VALIUM Take 5 mg by mouth as directed.   lactulose 10 GM/15ML solution Commonly known as: CHRONULAC Take 30 mLs (20 g total) by mouth daily. Can reduce dose to 10 mg daily or can hold if severe diarrhea Start taking on: December 10, 2023   levETIRAcetam  750 MG tablet Commonly known as: KEPPRA  Take 1,500 mg by mouth 2 (two) times daily.   OXYGEN  Inhale 2 L into the lungs as needed.   potassium chloride  10 MEQ tablet Commonly known as: KLOR-CON  Take 4 tablets (40 mEq total) by mouth daily. Start taking on: December 10, 2023   SlowMag Mg Muscle/Heart 71.5-119 MG Tbec SR tablet Generic drug: magnesium  chloride Take 1 tablet (64 mg total) by mouth daily. Start taking on: December 10, 2023   traZODone  150 MG tablet Commonly known as: DESYREL  Take 150 mg by mouth at bedtime.   Trelegy Ellipta  100-62.5-25 MCG/INH Aepb Generic drug: Fluticasone-Umeclidin-Vilant Inhale 1 puff into the lungs daily.   Vitamin D  (Ergocalciferol ) 1.25 MG (50000  UNIT) Caps capsule Commonly known as: DRISDOL  Take 50,000 Units by mouth once a week.         Follow-up Information     Lora Odor, FNP Follow up.   Specialty: Family Medicine Why: hospital follow up Contact information: [address] [place] [address] [phone]                 Allergies  Allergen Reactions   Codeine  Nausea And Vomiting    Was used while in hospital 02/24/14 and no reaction noted--01/06/15 BC  Other Reaction(s): itching and feels faint     Subjective: Pt feeling well this morning, alert, no concerns. Tolerating diet, no CP/SOB    Discharge Exam: BP 120/62 (BP Location: Right Arm)   Pulse 83   Temp 98.3 F (36.8 C)   Resp 16   Ht 5' 7 (1.702 m)  Wt 66.4 kg   LMP 10/27/2015 Comment: had hysterctomy  SpO2 100%   BMI 22.93 kg/m  General: Pt is alert, awake, not in acute distress Cardiovascular: RRR, S1/S2  Respiratory: CTA bilaterally, no wheezing, no rhonchi Abdominal: Soft, NT, ND, bowel sounds + Extremities: no edema, no cyanosis     The results of significant diagnostics from this hospitalization (including imaging, microbiology, ancillary and laboratory) are listed below for reference.     Microbiology: Recent Results (from the past 240 hours)  Blood culture (routine x 2)     Status: None   Collection Time: 12/03/23  2:39 PM   Specimen: BLOOD  Result Value Ref Range Status   Specimen Description BLOOD RIGHT ANTECUBITAL  Final   Special Requests   Final    BOTTLES DRAWN AEROBIC AND ANAEROBIC Blood Culture results may not be optimal due to an inadequate volume of blood received in culture bottles   Culture   Final    NO GROWTH 5 DAYS Performed at [facility], [address] [place], [place] [address]    Report Status 12/08/2023 FINAL  Final  Resp panel by RT-PCR (RSV, Flu A&B, Covid) Anterior Nasal Swab     Status: None   Collection Time: 12/03/23  2:39 PM   Specimen: Anterior Nasal Swab   Result Value Ref Range Status   SARS Coronavirus 2 by RT PCR NEGATIVE NEGATIVE Final    Comment: (NOTE) SARS-CoV-2 target nucleic acids are NOT DETECTED.  The SARS-CoV-2 RNA is generally detectable in upper respiratory specimens during the acute phase of infection. The lowest concentration of SARS-CoV-2 viral copies this assay can detect is 138 copies/mL. A negative result does not preclude SARS-Cov-2 infection and should not be used as the sole basis for treatment or other patient management decisions. A negative result may occur with  improper specimen collection/handling, submission of specimen other than nasopharyngeal swab, presence of viral mutation(s) within the areas targeted by this assay, and inadequate number of viral copies(<138 copies/mL). A negative result must be combined with clinical observations, patient history, and epidemiological information. The expected result is Negative.  Fact Sheet for Patients:  BloggerCourse.com  Fact Sheet for Healthcare Providers:  SeriousBroker.it  This test is no t yet approved or cleared by the [place]  FDA and  has been authorized for detection and/or diagnosis of SARS-CoV-2 by FDA under an Emergency Use Authorization (EUA). This EUA will remain  in effect (meaning this test can be used) for the duration of the COVID-19 declaration under Section 564(b)(1) of the Act, 21 U.S.C.section 360bbb-3(b)(1), unless the authorization is terminated  or revoked sooner.       Influenza A by PCR NEGATIVE NEGATIVE Final   Influenza B by PCR NEGATIVE NEGATIVE Final    Comment: (NOTE) The Xpert Xpress SARS-CoV-2/FLU/RSV plus assay is intended as an aid in the diagnosis of influenza from Nasopharyngeal swab specimens and should not be used as a sole basis for treatment. Nasal washings and aspirates are unacceptable for Xpert Xpress SARS-CoV-2/FLU/RSV testing.  Fact Sheet for  Patients: BloggerCourse.com  Fact Sheet for Healthcare Providers: SeriousBroker.it  This test is not yet approved or cleared by the [place]  FDA and has been authorized for detection and/or diagnosis of SARS-CoV-2 by FDA under an Emergency Use Authorization (EUA). This EUA will remain in effect (meaning this test can be used) for the duration of the COVID-19 declaration under Section 564(b)(1) of the Act, 21 U.S.C. section 360bbb-3(b)(1), unless the authorization is terminated or  revoked.     Resp Syncytial Virus by PCR NEGATIVE NEGATIVE Final    Comment: (NOTE) Fact Sheet for Patients: BloggerCourse.com  Fact Sheet for Healthcare Providers: SeriousBroker.it  This test is not yet approved or cleared by the [place]  FDA and has been authorized for detection and/or diagnosis of SARS-CoV-2 by FDA under an Emergency Use Authorization (EUA). This EUA will remain in effect (meaning this test can be used) for the duration of the COVID-19 declaration under Section 564(b)(1) of the Act, 21 U.S.C. section 360bbb-3(b)(1), unless the authorization is terminated or revoked.  Performed at [facility], [address] [place], [place] [address]   Blood culture (routine x 2)     Status: None   Collection Time: 12/03/23  3:05 PM   Specimen: BLOOD  Result Value Ref Range Status   Specimen Description BLOOD BLOOD RIGHT HAND  Final   Special Requests   Final    BOTTLES DRAWN AEROBIC AND ANAEROBIC Blood Culture results may not be optimal due to an inadequate volume of blood received in culture bottles   Culture   Final    NO GROWTH 5 DAYS Performed at [facility], [address], [place], [place] [address]    Report Status 12/08/2023 FINAL  Final  CSF culture w Gram Stain     Status: None   Collection Time: 12/03/23  6:56 PM   Specimen: CSF; Cerebrospinal Fluid   Result Value Ref Range Status   Specimen Description   Final    CSF Performed at [facility], [address], [place], [place] [address]    Special Requests   Final    NONE Performed at [facility], [address] [place], [place] [address]    Gram Stain   Final    NO ORGANISMS SEEN WBC SEEN RED BLOOD CELLS PRESENT GRAM STAIN REVIEWED-AGREE WITH RESULT [id number] AT 0029, ADC    Culture   Final    NO GROWTH 3 DAYS Performed at [facility], [address] [place], [place] [address]    Report Status 12/07/2023 FINAL  Final  Respiratory (~20 pathogens) panel by PCR     Status: None   Collection Time: 12/04/23 12:18 AM   Specimen: Nasopharyngeal Swab; Respiratory  Result Value Ref Range Status   Adenovirus NOT DETECTED NOT DETECTED Final   Coronavirus 229E NOT DETECTED NOT DETECTED Final    Comment: (NOTE) The Coronavirus on the Respiratory Panel, DOES NOT test for the novel  Coronavirus (2019 nCoV)    Coronavirus HKU1 NOT DETECTED NOT DETECTED Final   Coronavirus NL63 NOT DETECTED NOT DETECTED Final   Coronavirus OC43 NOT DETECTED NOT DETECTED Final   Metapneumovirus NOT DETECTED NOT DETECTED Final   Rhinovirus / Enterovirus NOT DETECTED NOT DETECTED Final   Influenza A NOT DETECTED NOT DETECTED Final   Influenza B NOT DETECTED NOT DETECTED Final   Parainfluenza Virus 1 NOT DETECTED NOT DETECTED Final   Parainfluenza Virus 2 NOT DETECTED NOT DETECTED Final   Parainfluenza Virus 3 NOT DETECTED NOT DETECTED Final   Parainfluenza Virus 4 NOT DETECTED NOT DETECTED Final   Respiratory Syncytial Virus NOT DETECTED NOT DETECTED Final   Bordetella pertussis NOT DETECTED NOT DETECTED Final   Bordetella Parapertussis NOT DETECTED NOT DETECTED Final   Chlamydophila pneumoniae NOT DETECTED NOT DETECTED Final   Mycoplasma pneumoniae NOT DETECTED NOT DETECTED Final    Comment: Performed at [facility], [address] [place], [place]  [address]      Labs: BNP (last 3 results) No results for input(s): BNP in the last 8760 hours. Basic Metabolic Panel: Recent Labs  Lab 12/04/23 0555 12/04/23 1633 12/05/23 0439 12/07/23 0305 12/07/23 1342 12/08/23 0550  NA 137  --  138 137 140 140  K 2.9*  --  2.9* 2.6* 3.6 3.0*  CL 104  --  107 107 112* 108  CO2 22  --  19* 21* 21* 21*  GLUCOSE 105*  --  91 142* 120* 129*  BUN 21  --  19 14 15 13   CREATININE 0.61  --  0.73 0.76 0.50 0.61  CALCIUM  9.6  --  9.6 9.5 9.7 9.6  MG  --  1.2* 1.8 1.5*  --   --    Liver Function Tests: Recent Labs  Lab 12/03/23 1439 12/04/23 0555 12/05/23 0439 12/07/23 0305  AST 101* 107* 65* 43*  ALT 25 29 25 20   ALKPHOS 155* 113 117 133*  BILITOT 2.7* 2.1* 1.9* 0.9  PROT 8.3* 6.7 6.4* 5.9*  ALBUMIN 4.5 3.2* 3.3* 3.2*   No results for input(s): LIPASE, AMYLASE in the last 168 hours. Recent Labs  Lab 12/03/23 2125 12/04/23 1633 12/05/23 0439 12/07/23 0305  AMMONIA 46* 47* 56* 57*   CBC: Recent Labs  Lab 12/03/23 1439 12/03/23 2009 12/04/23 0520 12/05/23 0439 12/07/23 0305 12/08/23 0550  WBC 7.1 7.1 6.5 7.8 4.9 3.9*  NEUTROABS 5.9 5.4  --   --   --   --   HGB 11.0* 10.3* 10.4* 10.2* 8.8* 9.1*  HCT 34.1* 31.5* 32.1* 30.2* 26.7* 28.6*  MCV 96.9 96.9 97.0 96.2 96.7 100.4*  PLT 86* 72* 64* 83* 101* 131*   Cardiac Enzymes: No results for input(s): CKTOTAL, CKMB, CKMBINDEX, TROPONINI in the last 168 hours. BNP: Invalid input(s): POCBNP CBG: No results for input(s): GLUCAP in the last 168 hours. D-Dimer No results for input(s): DDIMER in the last 72 hours. Hgb A1c No results for input(s): HGBA1C in the last 72 hours. Lipid Profile No results for input(s): CHOL, HDL, LDLCALC, TRIG, CHOLHDL, LDLDIRECT in the last 72 hours. Thyroid  function studies No results for input(s): TSH, T4TOTAL, T3FREE, THYROIDAB in the last 72 hours.  Invalid input(s): FREET3 Anemia work up No results for input(s):  VITAMINB12, FOLATE, FERRITIN, TIBC, IRON , RETICCTPCT in the last 72 hours. Urinalysis    Component Value Date/Time   COLORURINE YELLOW (A) 12/03/2023 1436   APPEARANCEUR CLEAR (A) 12/03/2023 1436   APPEARANCEUR Clear 02/05/2013 1159   LABSPEC 1.016 12/03/2023 1436   LABSPEC 1.024 02/05/2013 1159   PHURINE 8.0 12/03/2023 1436   GLUCOSEU NEGATIVE 12/03/2023 1436   GLUCOSEU Negative 02/05/2013 1159   HGBUR SMALL (A) 12/03/2023 1436   BILIRUBINUR NEGATIVE 12/03/2023 1436   BILIRUBINUR Negative 02/05/2013 1159   KETONESUR 20 (A) 12/03/2023 1436   PROTEINUR 30 (A) 12/03/2023 1436   NITRITE NEGATIVE 12/03/2023 1436   LEUKOCYTESUR NEGATIVE 12/03/2023 1436   LEUKOCYTESUR Negative 02/05/2013 1159   Sepsis Labs Recent Labs  Lab 12/04/23 0520 12/05/23 0439 12/07/23 0305 12/08/23 0550  WBC 6.5 7.8 4.9 3.9*   Microbiology Recent Results (from the past 240 hours)  Blood culture (routine x 2)     Status: None   Collection Time: 12/03/23  2:39 PM   Specimen: BLOOD  Result Value Ref Range Status   Specimen Description BLOOD RIGHT ANTECUBITAL  Final   Special Requests   Final    BOTTLES DRAWN AEROBIC AND ANAEROBIC Blood Culture  results may not be optimal due to an inadequate volume of blood received in culture bottles   Culture   Final    NO GROWTH 5 DAYS Performed at [facility], [address] [place], [place] [address]    Report Status 12/08/2023 FINAL  Final  Resp panel by RT-PCR (RSV, Flu A&B, Covid) Anterior Nasal Swab     Status: None   Collection Time: 12/03/23  2:39 PM   Specimen: Anterior Nasal Swab  Result Value Ref Range Status   SARS Coronavirus 2 by RT PCR NEGATIVE NEGATIVE Final    Comment: (NOTE) SARS-CoV-2 target nucleic acids are NOT DETECTED.  The SARS-CoV-2 RNA is generally detectable in upper respiratory specimens during the acute phase of infection. The lowest concentration of SARS-CoV-2 viral copies this assay can detect is 138  copies/mL. A negative result does not preclude SARS-Cov-2 infection and should not be used as the sole basis for treatment or other patient management decisions. A negative result may occur with  improper specimen collection/handling, submission of specimen other than nasopharyngeal swab, presence of viral mutation(s) within the areas targeted by this assay, and inadequate number of viral copies(<138 copies/mL). A negative result must be combined with clinical observations, patient history, and epidemiological information. The expected result is Negative.  Fact Sheet for Patients:  BloggerCourse.com  Fact Sheet for Healthcare Providers:  SeriousBroker.it  This test is no t yet approved or cleared by the [place]  FDA and  has been authorized for detection and/or diagnosis of SARS-CoV-2 by FDA under an Emergency Use Authorization (EUA). This EUA will remain  in effect (meaning this test can be used) for the duration of the COVID-19 declaration under Section 564(b)(1) of the Act, 21 U.S.C.section 360bbb-3(b)(1), unless the authorization is terminated  or revoked sooner.       Influenza A by PCR NEGATIVE NEGATIVE Final   Influenza B by PCR NEGATIVE NEGATIVE Final    Comment: (NOTE) The Xpert Xpress SARS-CoV-2/FLU/RSV plus assay is intended as an aid in the diagnosis of influenza from Nasopharyngeal swab specimens and should not be used as a sole basis for treatment. Nasal washings and aspirates are unacceptable for Xpert Xpress SARS-CoV-2/FLU/RSV testing.  Fact Sheet for Patients: BloggerCourse.com  Fact Sheet for Healthcare Providers: SeriousBroker.it  This test is not yet approved or cleared by the [place]  FDA and has been authorized for detection and/or diagnosis of SARS-CoV-2 by FDA under an Emergency Use Authorization (EUA). This EUA will remain in effect (meaning  this test can be used) for the duration of the COVID-19 declaration under Section 564(b)(1) of the Act, 21 U.S.C. section 360bbb-3(b)(1), unless the authorization is terminated or revoked.     Resp Syncytial Virus by PCR NEGATIVE NEGATIVE Final    Comment: (NOTE) Fact Sheet for Patients: BloggerCourse.com  Fact Sheet for Healthcare Providers: SeriousBroker.it  This test is not yet approved or cleared by the [place]  FDA and has been authorized for detection and/or diagnosis of SARS-CoV-2 by FDA under an Emergency Use Authorization (EUA). This EUA will remain in effect (meaning this test can be used) for the duration of the COVID-19 declaration under Section 564(b)(1) of the Act, 21 U.S.C. section 360bbb-3(b)(1), unless the authorization is terminated or revoked.  Performed at [facility], [address] [place], [place] [address]   Blood culture (routine x 2)     Status: None   Collection Time: 12/03/23  3:05 PM   Specimen: BLOOD  Result Value Ref Range Status  Specimen Description BLOOD BLOOD RIGHT HAND  Final   Special Requests   Final    BOTTLES DRAWN AEROBIC AND ANAEROBIC Blood Culture results may not be optimal due to an inadequate volume of blood received in culture bottles   Culture   Final    NO GROWTH 5 DAYS Performed at [facility] [facility], [address], [place], [place] [address]    Report Status 12/08/2023 FINAL  Final  CSF culture w Gram Stain     Status: None   Collection Time: 12/03/23  6:56 PM   Specimen: CSF; Cerebrospinal Fluid  Result Value Ref Range Status   Specimen Description   Final    CSF Performed at [facility], [address] [place], [place] [address]    Special Requests   Final    NONE Performed at [facility], [address] [place], [place] [address]    Gram Stain   Final    NO ORGANISMS SEEN WBC SEEN RED BLOOD CELLS PRESENT GRAM STAIN  REVIEWED-AGREE WITH RESULT [id number] AT 0029, ADC    Culture   Final    NO GROWTH 3 DAYS Performed at [facility], [address] [place], [place] [address]    Report Status 12/07/2023 FINAL  Final  Respiratory (~20 pathogens) panel by PCR     Status: None   Collection Time: 12/04/23 12:18 AM   Specimen: Nasopharyngeal Swab; Respiratory  Result Value Ref Range Status   Adenovirus NOT DETECTED NOT DETECTED Final   Coronavirus 229E NOT DETECTED NOT DETECTED Final    Comment: (NOTE) The Coronavirus on the Respiratory Panel, DOES NOT test for the novel  Coronavirus (2019 nCoV)    Coronavirus HKU1 NOT DETECTED NOT DETECTED Final   Coronavirus NL63 NOT DETECTED NOT DETECTED Final   Coronavirus OC43 NOT DETECTED NOT DETECTED Final   Metapneumovirus NOT DETECTED NOT DETECTED Final   Rhinovirus / Enterovirus NOT DETECTED NOT DETECTED Final   Influenza A NOT DETECTED NOT DETECTED Final   Influenza B NOT DETECTED NOT DETECTED Final   Parainfluenza Virus 1 NOT DETECTED NOT DETECTED Final   Parainfluenza Virus 2 NOT DETECTED NOT DETECTED Final   Parainfluenza Virus 3 NOT DETECTED NOT DETECTED Final   Parainfluenza Virus 4 NOT DETECTED NOT DETECTED Final   Respiratory Syncytial Virus NOT DETECTED NOT DETECTED Final   Bordetella pertussis NOT DETECTED NOT DETECTED Final   Bordetella Parapertussis NOT DETECTED NOT DETECTED Final   Chlamydophila pneumoniae NOT DETECTED NOT DETECTED Final   Mycoplasma pneumoniae NOT DETECTED NOT DETECTED Final    Comment: Performed at [facility], [address] [place], [place] [address]   Imaging ECHOCARDIOGRAM COMPLETE Result Date: 12/08/2023    ECHOCARDIOGRAM REPORT   Patient Name:   KALAN RINN Date of Exam: 12/08/2023 Medical Rec #:  [id number]     Height:       67.0 in Accession #:    [id number]    Weight:       146.4 lb Date of Birth:  1956-01-03     BSA:          1.771 m Patient Age:    68 years      BP:           112/63 mmHg Patient  Gender: F             HR:           74 bpm. Exam Location:  [facility] Procedure: 2D Echo,  Cardiac Doppler, Color Doppler and Strain Analysis (Both            Spectral and Color Flow Doppler were utilized during procedure). Indications:     Pericardial effusion I31.3                  Cardiomegaly I51.7  History:         Patient has no prior history of Echocardiogram examinations.                  Signs/Symptoms:Shortness of Breath; Risk Factors:Hypertension.  Sonographer:     Bari Roar Referring Phys:  [id number] LANETA BLUNT Diagnosing Phys: Dwayne D Callwood MD  Sonographer Comments: Global longitudinal strain was attempted. IMPRESSIONS  1. Left ventricular ejection fraction, by estimation, is 55 to 60%. The left ventricle has normal function. The left ventricle has no regional wall motion abnormalities. Left ventricular diastolic parameters were normal. The average left ventricular global longitudinal strain is 21.5 %. The global longitudinal strain is normal.  2. Right ventricular systolic function is normal. The right ventricular size is normal.  3. The mitral valve is normal in structure. Trivial mitral valve regurgitation.  4. The aortic valve is normal in structure. Aortic valve regurgitation is not visualized. Conclusion(s)/Recommendation(s): Poor windows for evaluation of left ventricular function by transthoracic echocardiography. Would recommend an alternative means of evaluation. FINDINGS  Left Ventricle: Left ventricular ejection fraction, by estimation, is 55 to 60%. The left ventricle has normal function. The left ventricle has no regional wall motion abnormalities. The average left ventricular global longitudinal strain is 21.5 %. Strain was performed and the global longitudinal strain is normal. The left ventricular internal cavity size was normal in size. There is borderline left ventricular hypertrophy. Left ventricular diastolic parameters were normal. Right Ventricle: The right ventricular size  is normal. No increase in right ventricular wall thickness. Right ventricular systolic function is normal. Left Atrium: Left atrial size was normal in size. Right Atrium: Right atrial size was normal in size. Pericardium: There is no evidence of pericardial effusion. Mitral Valve: The mitral valve is normal in structure. Trivial mitral valve regurgitation. MV peak gradient, 6.6 mmHg. The mean mitral valve gradient is 3.0 mmHg. Tricuspid Valve: The tricuspid valve is normal in structure. Tricuspid valve regurgitation is trivial. Aortic Valve: The aortic valve is normal in structure. Aortic valve regurgitation is not visualized. Aortic valve mean gradient measures 4.0 mmHg. Aortic valve peak gradient measures 8.8 mmHg. Aortic valve area, by VTI measures 2.27 cm. Pulmonic Valve: The pulmonic valve was normal in structure. Pulmonic valve regurgitation is not visualized. Aorta: The ascending aorta was not well visualized. IAS/Shunts: No atrial level shunt detected by color flow Doppler. Additional Comments: 3D was performed not requiring image post processing on an independent workstation and was indeterminate.  LEFT VENTRICLE PLAX 2D LVIDd:         4.80 cm   Diastology LVIDs:         3.40 cm   LV e' medial:    10.60 cm/s LV PW:         1.00 cm   LV E/e' medial:  10.0 LV IVS:        1.10 cm   LV e' lateral:   10.60 cm/s LVOT diam:     1.80 cm   LV E/e' lateral: 10.0 LV SV:         64 LV SV Index:   36        2D Longitudinal Strain  LVOT Area:     2.54 cm  2D Strain GLS (A4C):   19.9 %                          2D Strain GLS (A3C):   23.9 %                          2D Strain GLS (A2C):   20.8 %                          2D Strain GLS Avg:     21.5 % RIGHT VENTRICLE RV Basal diam:  3.20 cm RV Mid diam:    2.40 cm RV S prime:     10.30 cm/s TAPSE (M-mode): 2.4 cm LEFT ATRIUM             Index        RIGHT ATRIUM           Index LA diam:        3.90 cm 2.20 cm/m   RA Area:     16.90 cm LA Vol (A2C):   55.3 ml 31.22 ml/m   RA Volume:   39.30 ml  22.19 ml/m LA Vol (A4C):   51.2 ml 28.91 ml/m LA Biplane Vol: 54.4 ml 30.72 ml/m  AORTIC VALVE                    PULMONIC VALVE AV Area (Vmax):    2.01 cm     PV Vmax:          1.15 m/s AV Area (Vmean):   2.21 cm     PV Peak grad:     5.3 mmHg AV Area (VTI):     2.27 cm     PR End Diast Vel: 6.35 msec AV Vmax:           148.00 cm/s  RVOT Peak grad:   2 mmHg AV Vmean:          94.200 cm/s AV VTI:            0.282 m AV Peak Grad:      8.8 mmHg AV Mean Grad:      4.0 mmHg LVOT Vmax:         117.00 cm/s LVOT Vmean:        81.700 cm/s LVOT VTI:          0.252 m LVOT/AV VTI ratio: 0.89  AORTA Ao Root diam: 2.30 cm Ao Asc diam:  2.90 cm MITRAL VALVE                TRICUSPID VALVE MV Area (PHT): 3.36 cm     TR Peak grad:   31.8 mmHg MV Area VTI:   2.06 cm     TR Vmax:        282.00 cm/s MV Peak grad:  6.6 mmHg MV Mean grad:  3.0 mmHg     SHUNTS MV Vmax:       1.28 m/s     Systemic VTI:  0.25 m MV Vmean:      74.7 cm/s    Systemic Diam: 1.80 cm MV Decel Time: 226 msec MV E velocity: 106.00 cm/s MV A velocity: 87.80 cm/s MV E/A ratio:  1.21 MV A Prime:    9.2 cm/s Cara JONETTA Lovelace MD Electronically signed by Cara JONETTA Lovelace MD  Signature Date/Time: 12/08/2023/2:34:45 PM    Final       Time coordinating discharge: over 30 minutes  SIGNED:  Issak Goley DO [facility]

## 2024-02-10 ENCOUNTER — Other Ambulatory Visit (HOSPITAL_COMMUNITY): Payer: Self-pay

## 2024-02-24 ENCOUNTER — Inpatient Hospital Stay: Attending: Oncology

## 2024-02-24 DIAGNOSIS — Z79899 Other long term (current) drug therapy: Secondary | ICD-10-CM | POA: Insufficient documentation

## 2024-02-24 DIAGNOSIS — Z800 Family history of malignant neoplasm of digestive organs: Secondary | ICD-10-CM | POA: Insufficient documentation

## 2024-02-24 DIAGNOSIS — Z803 Family history of malignant neoplasm of breast: Secondary | ICD-10-CM | POA: Insufficient documentation

## 2024-02-24 DIAGNOSIS — Z8041 Family history of malignant neoplasm of ovary: Secondary | ICD-10-CM | POA: Insufficient documentation

## 2024-02-24 DIAGNOSIS — Z808 Family history of malignant neoplasm of other organs or systems: Secondary | ICD-10-CM | POA: Insufficient documentation

## 2024-02-24 DIAGNOSIS — Z87891 Personal history of nicotine dependence: Secondary | ICD-10-CM | POA: Insufficient documentation

## 2024-02-24 DIAGNOSIS — D509 Iron deficiency anemia, unspecified: Secondary | ICD-10-CM | POA: Insufficient documentation

## 2024-02-25 ENCOUNTER — Inpatient Hospital Stay: Admitting: Oncology

## 2024-02-25 ENCOUNTER — Inpatient Hospital Stay

## 2024-03-03 ENCOUNTER — Encounter: Payer: Self-pay | Admitting: Oncology

## 2024-03-04 ENCOUNTER — Inpatient Hospital Stay

## 2024-03-04 ENCOUNTER — Other Ambulatory Visit: Payer: Self-pay

## 2024-03-04 DIAGNOSIS — Z803 Family history of malignant neoplasm of breast: Secondary | ICD-10-CM | POA: Diagnosis not present

## 2024-03-04 DIAGNOSIS — Z87891 Personal history of nicotine dependence: Secondary | ICD-10-CM | POA: Diagnosis not present

## 2024-03-04 DIAGNOSIS — D509 Iron deficiency anemia, unspecified: Secondary | ICD-10-CM

## 2024-03-04 DIAGNOSIS — Z8041 Family history of malignant neoplasm of ovary: Secondary | ICD-10-CM | POA: Diagnosis not present

## 2024-03-04 DIAGNOSIS — Z800 Family history of malignant neoplasm of digestive organs: Secondary | ICD-10-CM | POA: Diagnosis not present

## 2024-03-04 DIAGNOSIS — Z79899 Other long term (current) drug therapy: Secondary | ICD-10-CM | POA: Diagnosis not present

## 2024-03-04 DIAGNOSIS — Z808 Family history of malignant neoplasm of other organs or systems: Secondary | ICD-10-CM | POA: Diagnosis not present

## 2024-03-04 LAB — CMP (CANCER CENTER ONLY)
ALT: 12 U/L (ref 0–44)
AST: 22 U/L (ref 15–41)
Albumin: 4.4 g/dL (ref 3.5–5.0)
Alkaline Phosphatase: 161 U/L — ABNORMAL HIGH (ref 38–126)
Anion gap: 12 (ref 5–15)
BUN: 13 mg/dL (ref 8–23)
CO2: 21 mmol/L — ABNORMAL LOW (ref 22–32)
Calcium: 11.7 mg/dL — ABNORMAL HIGH (ref 8.9–10.3)
Chloride: 109 mmol/L (ref 98–111)
Creatinine: 0.94 mg/dL (ref 0.44–1.00)
GFR, Estimated: >60 mL/min
Glucose, Bld: 100 mg/dL — ABNORMAL HIGH (ref 70–99)
Potassium: 3.8 mmol/L (ref 3.5–5.1)
Sodium: 142 mmol/L (ref 135–145)
Total Bilirubin: 0.5 mg/dL (ref 0.0–1.2)
Total Protein: 8.1 g/dL (ref 6.5–8.1)

## 2024-03-04 LAB — CBC WITH DIFFERENTIAL (CANCER CENTER ONLY)
Abs Immature Granulocytes: 0.04 K/uL (ref 0.00–0.07)
Basophils Absolute: 0.1 K/uL (ref 0.0–0.1)
Basophils Relative: 1 %
Eosinophils Absolute: 0.3 K/uL (ref 0.0–0.5)
Eosinophils Relative: 3 %
HCT: 39.4 % (ref 36.0–46.0)
Hemoglobin: 12.6 g/dL (ref 12.0–15.0)
Immature Granulocytes: 1 %
Lymphocytes Relative: 17 %
Lymphs Abs: 1.4 K/uL (ref 0.7–4.0)
MCH: 29.3 pg (ref 26.0–34.0)
MCHC: 32 g/dL (ref 30.0–36.0)
MCV: 91.6 fL (ref 80.0–100.0)
Monocytes Absolute: 0.6 K/uL (ref 0.1–1.0)
Monocytes Relative: 7 %
Neutro Abs: 5.6 K/uL (ref 1.7–7.7)
Neutrophils Relative %: 71 %
Platelet Count: 180 K/uL (ref 150–400)
RBC: 4.3 MIL/uL (ref 3.87–5.11)
RDW: 14.7 % (ref 11.5–15.5)
WBC Count: 7.9 K/uL (ref 4.0–10.5)
nRBC: 0 % (ref 0.0–0.2)

## 2024-03-04 LAB — IRON AND TIBC
Iron: 50 ug/dL (ref 28–170)
Saturation Ratios: 15 % (ref 10.4–31.8)
TIBC: 326 ug/dL (ref 250–450)
UIBC: 277 ug/dL

## 2024-03-04 LAB — FERRITIN: Ferritin: 38 ng/mL (ref 11–307)

## 2024-03-09 ENCOUNTER — Other Ambulatory Visit: Payer: Self-pay | Admitting: *Deleted

## 2024-03-09 DIAGNOSIS — D509 Iron deficiency anemia, unspecified: Secondary | ICD-10-CM

## 2024-03-10 ENCOUNTER — Inpatient Hospital Stay

## 2024-03-10 ENCOUNTER — Other Ambulatory Visit: Payer: Self-pay | Admitting: Oncology

## 2024-03-11 ENCOUNTER — Inpatient Hospital Stay

## 2024-03-11 ENCOUNTER — Encounter: Payer: Self-pay | Admitting: Oncology

## 2024-03-11 ENCOUNTER — Inpatient Hospital Stay: Admitting: Oncology

## 2024-03-11 VITALS — BP 140/90 | HR 98 | Temp 97.9°F | Resp 18 | Ht 67.0 in | Wt 135.0 lb

## 2024-03-11 DIAGNOSIS — D509 Iron deficiency anemia, unspecified: Secondary | ICD-10-CM

## 2024-03-11 NOTE — Progress Notes (Signed)
 " [facility]  [phone] [phone]  ID: Mervyn Billie Sharps OB: Oct 17, 1955  MR#: [id number]  RDW#:[id number]  Patient Care Team: Lora Odor, FNP as PCP - General (Family Medicine) Theotis Lavelle BRAVO, MD as Referring Physician (Pulmonary Disease) Unk Corinn Skiff, MD as Consulting Physician (Gastroenterology) Jacobo Evalene PARAS, MD as Consulting Physician (Oncology)  CHIEF COMPLAINT: Iron  deficiency anemia.  INTERVAL HISTORY: Patient returns to clinic today for repeat laboratory, further evaluation, consideration of additional IV Venofer .  She continues to feel well and remains asymptomatic.  She does not complain of any weakness or fatigue today. She has no neurologic complaints.  She denies any recent fevers or illnesses.  She has a good appetite and denies weight loss.  She has no chest pain, shortness of breath, cough, or hemoptysis.  She denies any nausea, vomiting, or diarrhea.  She has no melena or hematochezia.  She has no urinary complaints.  Patient offers no specific complaints today.  REVIEW OF SYSTEMS:   Review of Systems  Constitutional: Negative.  Negative for fever, malaise/fatigue and weight loss.  Respiratory: Negative.  Negative for cough, hemoptysis and shortness of breath.   Cardiovascular: Negative.  Negative for chest pain and leg swelling.  Gastrointestinal: Negative.  Negative for abdominal pain, blood in stool, constipation and melena.  Genitourinary: Negative.  Negative for hematuria.  Musculoskeletal: Negative.  Negative for back pain.  Skin: Negative.  Negative for rash.  Neurological: Negative.  Negative for dizziness, focal weakness, weakness and headaches.  Psychiatric/Behavioral: Negative.      As per HPI. Otherwise, a complete review of systems is negative.  PAST MEDICAL HISTORY: Past Medical History:  Diagnosis Date   Allergy    Anemia    Chronic back pain    Complication of anesthesia    slow  to wake   COPD (chronic obstructive pulmonary disease) (HCC)    2l home oxygen    Dyspnea    GERD (gastroesophageal reflux disease)    Headache    HTN (hypertension)    Hyperlipidemia    Insomnia    Insomnia    Osteoarthritis    Osteoporosis    Oxygen  deficiency    Tachycardia    Vertigo    none recently   Wears dentures    partial lower    PAST SURGICAL HISTORY: Past Surgical History:  Procedure Laterality Date   ABDOMINAL HYSTERECTOMY     BIOPSY  12/31/2022   Procedure: BIOPSY;  Surgeon: Unk Corinn Skiff, MD;  Location: [facility] ENDOSCOPY;  Service: Gastroenterology;;   COLONOSCOPY WITH PROPOFOL  N/A 12/31/2022   Procedure: COLONOSCOPY WITH PROPOFOL ;  Surgeon: Unk Corinn Skiff, MD;  Location: [facility] ENDOSCOPY;  Service: Gastroenterology;  Laterality: N/A;   CYST REMOVAL TRUNK     CYSTECTOMY     back and side of head   CYSTOSCOPY N/A 12/13/2015   Procedure: CYSTOSCOPY;  Surgeon: Glory High, MD;  Location: [facility] ORS;  Service: Gynecology;  Laterality: N/A;   ESOPHAGOGASTRODUODENOSCOPY (EGD) WITH PROPOFOL  N/A 12/31/2022   Procedure: ESOPHAGOGASTRODUODENOSCOPY (EGD) WITH PROPOFOL ;  Surgeon: Unk Corinn Skiff, MD;  Location: [facility] ENDOSCOPY;  Service: Gastroenterology;  Laterality: N/A;   HOT HEMOSTASIS  12/31/2022   Procedure: HOT HEMOSTASIS (ARGON PLASMA COAGULATION/BICAP);  Surgeon: Unk Corinn Skiff, MD;  Location: [facility] ENDOSCOPY;  Service: Gastroenterology;;   LAPAROSCOPIC BILATERAL SALPINGO OOPHERECTOMY Bilateral 12/13/2015   Procedure: LAPAROSCOPIC BILATERAL SALPINGO OOPHORECTOMY;  Surgeon: Glory High, MD;  Location: [facility] ORS;  Service: Gynecology;  Laterality: Bilateral;   LAPAROSCOPIC HYSTERECTOMY N/A  12/13/2015   Procedure: HYSTERECTOMY TOTAL LAPAROSCOPIC;  Surgeon: Glory High, MD;  Location: [facility] ORS;  Service: Gynecology;  Laterality: N/A;   tear duct surgery     bilateral   TUBAL LIGATION      FAMILY HISTORY: Family History  Problem Relation  Age of Onset   Hypertension Mother    Diabetes Mellitus II Mother    Dementia Mother    Diabetes Mother    Cancer - Other Father        brain tumor   Cancer Father        brain tumor   Breast cancer Maternal Aunt 44   Cancer Maternal Aunt        ovarian   Breast cancer Maternal Grandmother 91   Cancer Maternal Grandmother        breast   Diabetes Brother    Hypertension Brother    COPD Brother    Cancer Maternal Grandfather        stomach   Heart disease Paternal Grandmother    Stroke Paternal Grandfather     ADVANCED DIRECTIVES (Y/N):  N  HEALTH MAINTENANCE: Social History   Tobacco Use   Smoking status: Former    Current packs/day: 0.00    Average packs/day: 0.5 packs/day for 15.0 years (7.5 ttl pk-yrs)    Types: Cigarettes    Start date: 04/24/1996    Quit date: 04/25/2011    Years since quitting: 12.8   Smokeless tobacco: Never  Vaping Use   Vaping status: Never Used  Substance Use Topics   Alcohol use: Yes    Alcohol/week: 10.0 standard drinks of alcohol    Types: 10 Shots of liquor per week    Comment: 1-2 drinks daily   Drug use: No     Colonoscopy:  PAP:  Bone density:  Lipid panel:  Allergies  Allergen Reactions   Codeine  Nausea And Vomiting    Was used while in hospital 02/24/14 and no reaction noted--01/06/15 [facility]  Other Reaction(s): itching and feels faint    Current Outpatient Medications  Medication Sig Dispense Refill   albuterol  (PROVENTIL ) (2.5 MG/3ML) 0.083% nebulizer solution Take 3 mLs (2.5 mg total) by nebulization every 4 (four) hours as needed for wheezing or shortness of breath. 150 mL 3   albuterol  (VENTOLIN  HFA) 108 (90 Base) MCG/ACT inhaler Inhale 2 puffs into the lungs every 6 (six) hours as needed for wheezing or shortness of breath. 1 each 3   atorvastatin  (LIPITOR) 40 MG tablet Take 1 tablet by mouth daily.     bisacodyl  (DULCOLAX) 5 MG EC tablet Take 5 mg by mouth daily as needed for moderate constipation.     Blood Pressure  Monitoring (BLOOD PRESSURE CUFF) MISC 1 each by Does not apply route daily. 1 each 0   diazepam (VALIUM) 5 MG tablet Take 5 mg by mouth as directed.     Fluticasone-Umeclidin-Vilant (TRELEGY ELLIPTA ) 100-62.5-25 MCG/INH AEPB Inhale 1 puff into the lungs daily. 1 each 11   lactulose  (CHRONULAC ) 10 GM/15ML solution Take 30 mLs (20 g total) by mouth daily. Can reduce dose to 10 mg daily or can hold if severe diarrhea 946 mL 0   levETIRAcetam  (KEPPRA ) 750 MG tablet Take 1,500 mg by mouth 2 (two) times daily.     magnesium  chloride (SLOW-MAG) 64 MG TBEC SR tablet Take 1 tablet (64 mg total) by mouth daily. 30 tablet 0   OXYGEN  Inhale 2 L into the lungs as needed.  potassium chloride  (KLOR-CON ) 10 MEQ tablet Take 4 tablets (40 mEq total) by mouth daily. 120 tablet 0   traZODone  (DESYREL ) 150 MG tablet Take 150 mg by mouth at bedtime.     Vitamin D , Ergocalciferol , (DRISDOL ) 1.25 MG (50000 UNIT) CAPS capsule Take 50,000 Units by mouth once a week.     No current facility-administered medications for this visit.    OBJECTIVE: Vitals:   03/11/24 1456  BP: (!) 140/90  Pulse: 98  Resp: 18  Temp: 97.9 F (36.6 C)  SpO2: 100%     Body mass index is 21.14 kg/m.    ECOG FS:0 - Asymptomatic  General: Well-developed, well-nourished, no acute distress. Eyes: Pink conjunctiva, anicteric sclera. HEENT: Normocephalic, moist mucous membranes. Lungs: No audible wheezing or coughing. Heart: Regular rate and rhythm. Abdomen: Soft, nontender, no obvious distention. Musculoskeletal: No edema, cyanosis, or clubbing. Neuro: Alert, answering all questions appropriately. Cranial nerves grossly intact. Skin: No rashes or petechiae noted. Psych: Normal affect.  LAB RESULTS:  Lab Results  Component Value Date   NA 142 03/04/2024   K 3.8 03/04/2024   CL 109 03/04/2024   CO2 21 (L) 03/04/2024   GLUCOSE 100 (H) 03/04/2024   BUN 13 03/04/2024   CREATININE 0.94 03/04/2024   CALCIUM  11.7 (H) 03/04/2024    PROT 8.1 03/04/2024   ALBUMIN 4.4 03/04/2024   AST 22 03/04/2024   ALT 12 03/04/2024   ALKPHOS 161 (H) 03/04/2024   BILITOT 0.5 03/04/2024   GFRNONAA >60 03/04/2024   GFRAA 94 04/29/2019    Lab Results  Component Value Date   WBC 7.9 03/04/2024   NEUTROABS 5.6 03/04/2024   HGB 12.6 03/04/2024   HCT 39.4 03/04/2024   MCV 91.6 03/04/2024   PLT 180 03/04/2024   Lab Results  Component Value Date   IRON  50 03/04/2024   TIBC 326 03/04/2024   IRONPCTSAT 15 03/04/2024   Lab Results  Component Value Date   FERRITIN 38 03/04/2024     STUDIES: No results found.  ASSESSMENT: Iron  deficiency anemia.  PLAN:    Iron  deficiency anemia: Patient's hemoglobin and iron  stores are now within normal limits.  She is also asymptomatic.  Colonoscopy on December 31, 2022 did not reveal any significant pathology.  EGD on the same date cauterized several angiodysplastic lesions that may be the etiology of her blood loss.  No intervention is needed.  She does not require additional IV Venofer  at this time.  Patient last received IV Venofer  on January 31, 2023.  Per discussion with the patient, it was decided that no further follow-up is necessary.  Please refer patient back if there are any questions or concerns.   Constipation: Does not complain of this today.  I spent a total of 20 minutes reviewing chart data, face-to-face evaluation with the patient, counseling and coordination of care as detailed above.   Patient expressed understanding and was in agreement with this plan. She also understands that She can call clinic at any time with any questions, concerns, or complaints.    Evalene JINNY Reusing, MD   03/11/2024 3:25 PM     "
